# Patient Record
Sex: Female | Born: 1935
Health system: Southern US, Community
[De-identification: ages and names within clinical notes are randomized; demographics above are authoritative.]

## PROBLEM LIST (undated history)

## (undated) DIAGNOSIS — E1129 Type 2 diabetes mellitus with other diabetic kidney complication: Secondary | ICD-10-CM

## (undated) DIAGNOSIS — R7989 Other specified abnormal findings of blood chemistry: Secondary | ICD-10-CM

## (undated) DIAGNOSIS — E785 Hyperlipidemia, unspecified: Secondary | ICD-10-CM

## (undated) DIAGNOSIS — N959 Unspecified menopausal and perimenopausal disorder: Secondary | ICD-10-CM

## (undated) DIAGNOSIS — Z950 Presence of cardiac pacemaker: Secondary | ICD-10-CM

## (undated) DIAGNOSIS — D649 Anemia, unspecified: Secondary | ICD-10-CM

## (undated) DIAGNOSIS — I10 Essential (primary) hypertension: Secondary | ICD-10-CM

## (undated) DIAGNOSIS — K5790 Diverticulosis of intestine, part unspecified, without perforation or abscess without bleeding: Secondary | ICD-10-CM

## (undated) DIAGNOSIS — M1990 Unspecified osteoarthritis, unspecified site: Secondary | ICD-10-CM

## (undated) DIAGNOSIS — E559 Vitamin D deficiency, unspecified: Secondary | ICD-10-CM

## (undated) DIAGNOSIS — R011 Cardiac murmur, unspecified: Secondary | ICD-10-CM

## (undated) DIAGNOSIS — G909 Disorder of the autonomic nervous system, unspecified: Secondary | ICD-10-CM

## (undated) DIAGNOSIS — N6019 Diffuse cystic mastopathy of unspecified breast: Secondary | ICD-10-CM

## (undated) DIAGNOSIS — N183 Chronic kidney disease, stage 3 unspecified: Secondary | ICD-10-CM

## (undated) DIAGNOSIS — I495 Sick sinus syndrome: Secondary | ICD-10-CM

## (undated) DIAGNOSIS — M810 Age-related osteoporosis without current pathological fracture: Secondary | ICD-10-CM

## (undated) DIAGNOSIS — R945 Abnormal results of liver function studies: Secondary | ICD-10-CM

## (undated) HISTORY — DX: Chronic kidney disease, stage 3 (moderate): N18.3

## (undated) HISTORY — DX: Unspecified osteoarthritis, unspecified site: M19.90

## (undated) HISTORY — DX: Other specified abnormal findings of blood chemistry: R79.89

## (undated) HISTORY — DX: Anemia, unspecified: D64.9

## (undated) HISTORY — DX: Chronic kidney disease, stage 3 unspecified: N18.30

## (undated) HISTORY — DX: Disorder of the autonomic nervous system, unspecified: G90.9

## (undated) HISTORY — PX: INSERT / REPLACE / REMOVE PACEMAKER: SUR710

## (undated) HISTORY — DX: Abnormal results of liver function studies: R94.5

## (undated) HISTORY — DX: Type 2 diabetes mellitus with other diabetic kidney complication: E11.29

## (undated) HISTORY — DX: Cardiac murmur, unspecified: R01.1

## (undated) HISTORY — DX: Sick sinus syndrome: I49.5

## (undated) HISTORY — DX: Vitamin D deficiency, unspecified: E55.9

## (undated) HISTORY — DX: Unspecified menopausal and perimenopausal disorder: N95.9

## (undated) HISTORY — DX: Hyperlipidemia, unspecified: E78.5

## (undated) HISTORY — DX: Diverticulosis of intestine, part unspecified, without perforation or abscess without bleeding: K57.90

## (undated) HISTORY — DX: Essential (primary) hypertension: I10

## (undated) HISTORY — DX: Diffuse cystic mastopathy of unspecified breast: N60.19

## (undated) HISTORY — DX: Age-related osteoporosis without current pathological fracture: M81.0

---

## 2004-01-19 ENCOUNTER — Ambulatory Visit: Payer: Self-pay

## 2004-02-28 HISTORY — PX: PACEMAKER INSERTION: SHX728

## 2004-08-12 ENCOUNTER — Other Ambulatory Visit: Payer: Self-pay

## 2004-08-12 ENCOUNTER — Inpatient Hospital Stay: Payer: Self-pay | Admitting: Internal Medicine

## 2004-08-13 ENCOUNTER — Other Ambulatory Visit: Payer: Self-pay

## 2004-08-16 ENCOUNTER — Other Ambulatory Visit: Payer: Self-pay

## 2005-05-08 ENCOUNTER — Ambulatory Visit: Payer: Self-pay | Admitting: General Surgery

## 2005-06-02 ENCOUNTER — Ambulatory Visit: Payer: Self-pay | Admitting: Family Medicine

## 2006-08-02 IMAGING — CR DG CHEST 1V PORT
1 series · 1 of 1 positions shown · non-contrast
Comparison: none

REASON FOR EXAM: Central line placement
COMMENTS:

PROCEDURE:     DXR - DXR PORTABLE CHEST SINGLE VIEW  - August 12, 2004 [DATE]
RESULT:
CLINICAL HISTORY: 70-year-old female status post central line placement.

[view not recorded]
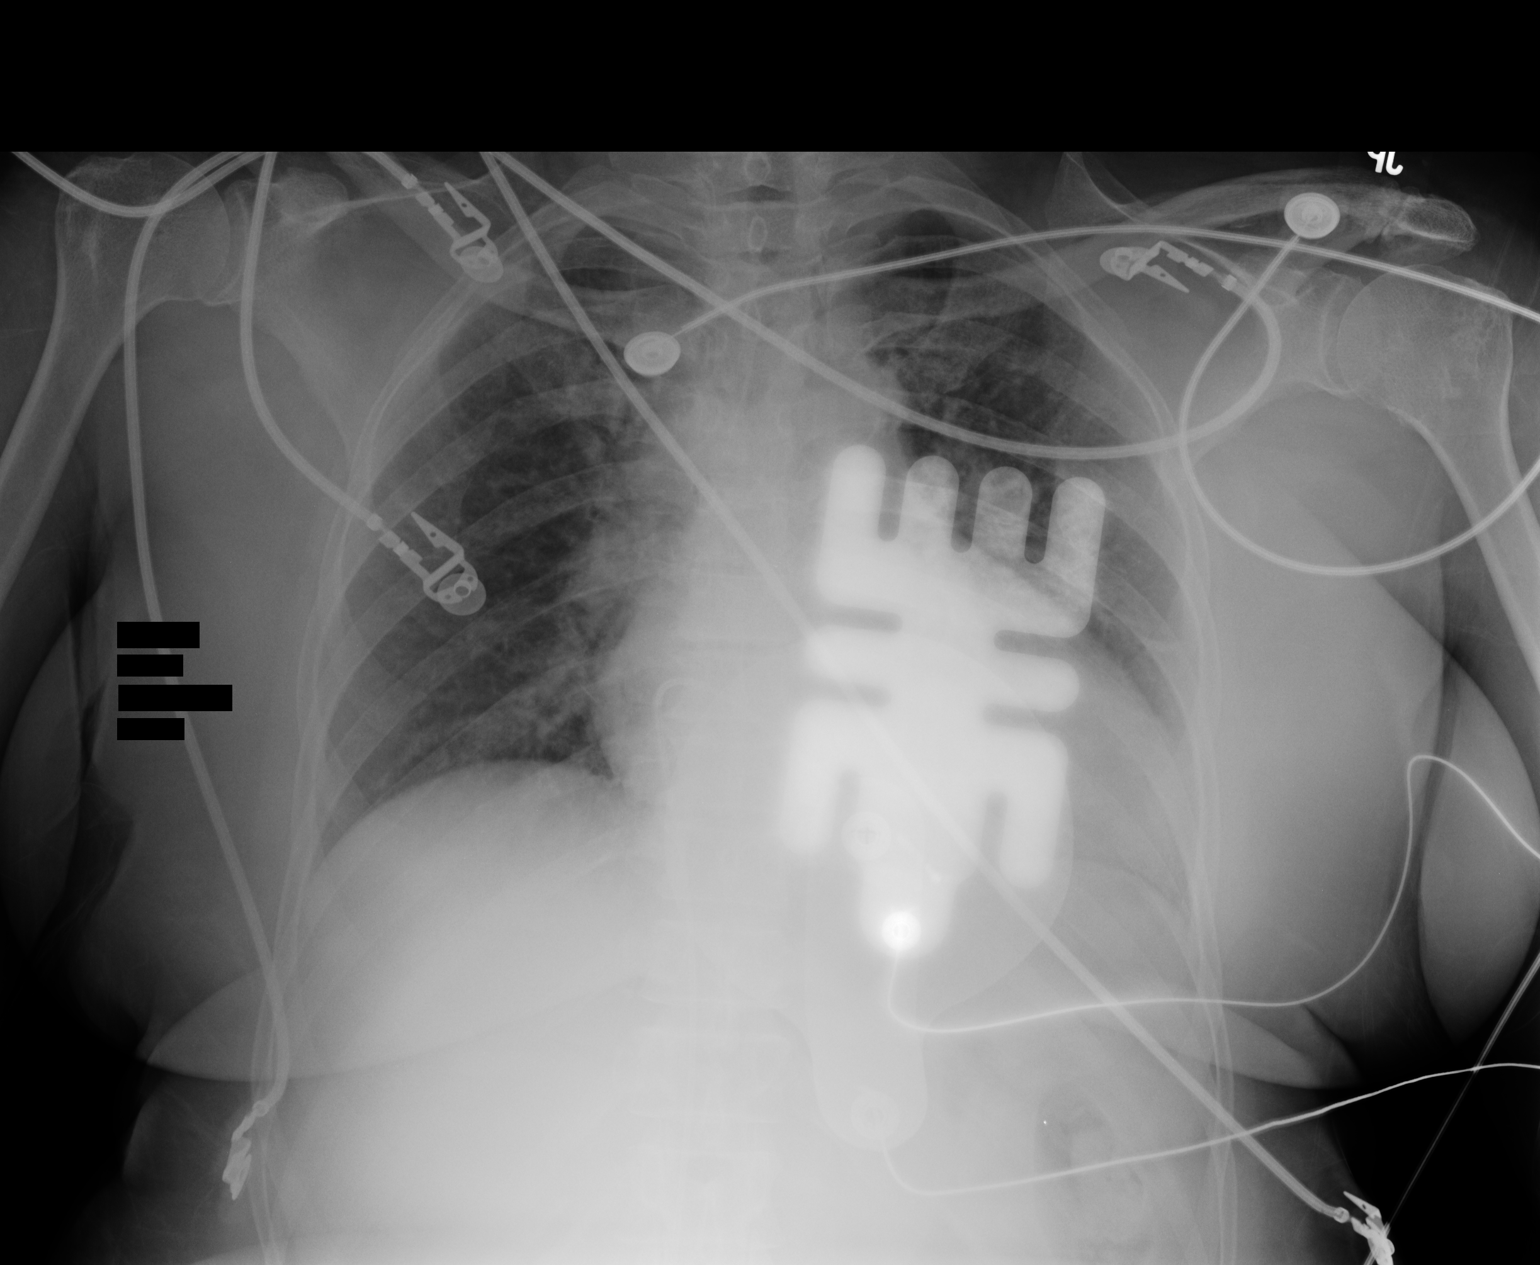

[1 of 1 positions shown; findings below may reference images not displayed]

FINDINGS: Single AP view of the chest is provided.  The lungs are
hypoinflated.  Cardiac silhouette may be enlarged.  There is vascular
congestion without air-space consolidation or definite evidence for edema.
No pleural effusion or pneumothorax is seen.  There is a transcutaneous
pacing pad overlying the LEFT mediastinum, which obscures visualization in
this area.  There is a line in place from an inferior approach which courses
over the expected location of the RIGHT atrium before turning to the LEFT
and terminating over the region of the RIGHT ventricle.  This line has a
radiopaque tip and has the appearance of an transvenous pacing wire.  No
other lines are seen.
IMPRESSION: Cardiomegaly and hypoventilation.

Probable temporary transvenous pacer wire from an inferior approach
terminating over the region of the RIGHT ventricle.

## 2006-08-02 IMAGING — CT CT HEAD WITHOUT CONTRAST
1 series · 16 of 29 positions shown, 20 images · non-contrast
Comparison: none

REASON FOR EXAM: syncope
COMMENTS:

[Series 3: head injury 5.0 h40s · axial · 0.41mm/px · z∈[-180,-50]mm · 16 of 29 slices shown, 20 images]
[im 2/29  brain]
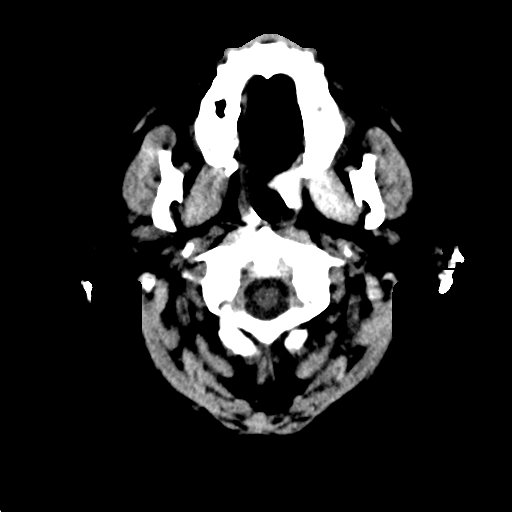
[im 2/29  bone]
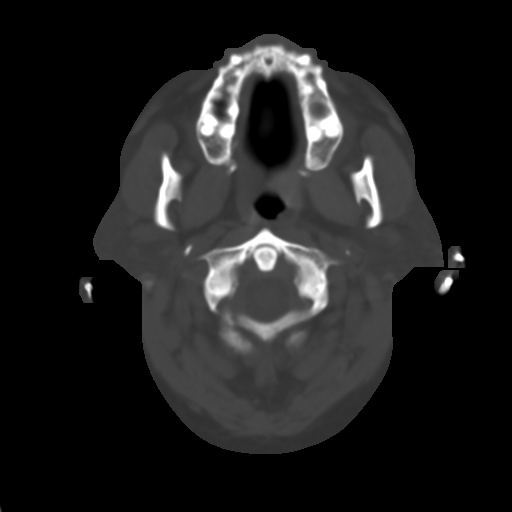
[im 4/29  brain]
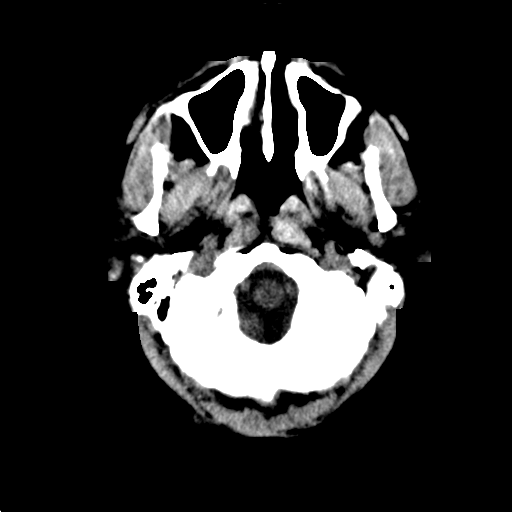
[im 6/29  brain]
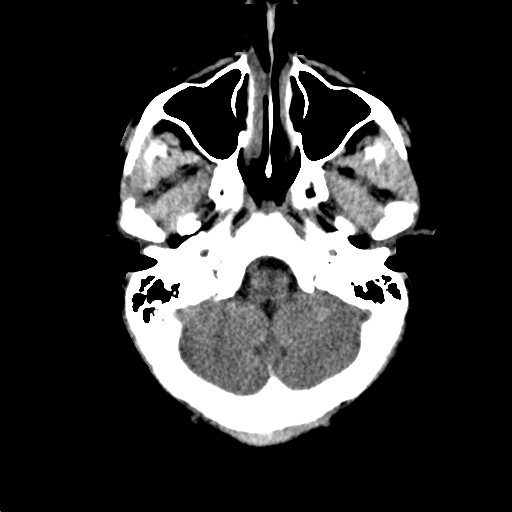
[im 7/29  brain]
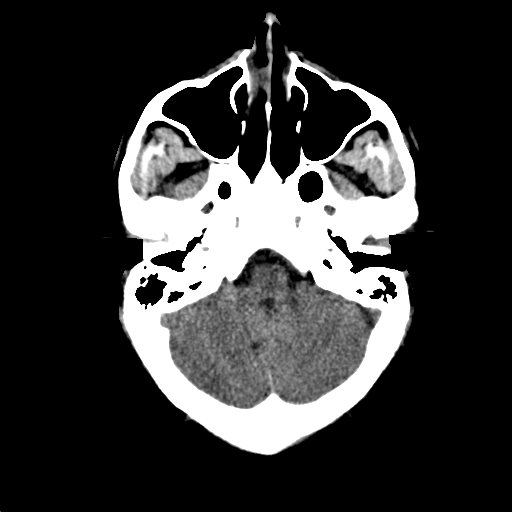
[im 9/29  brain]
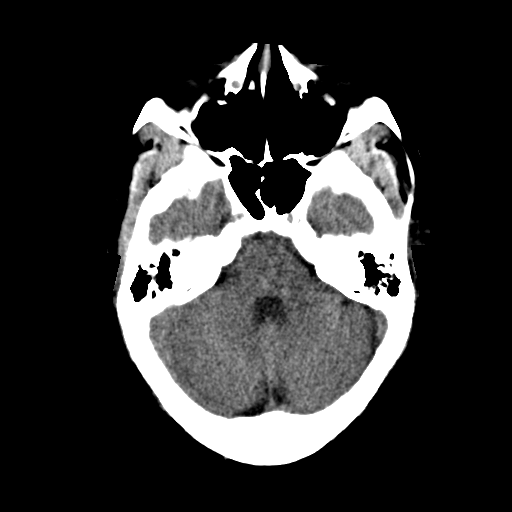
[im 9/29  bone]
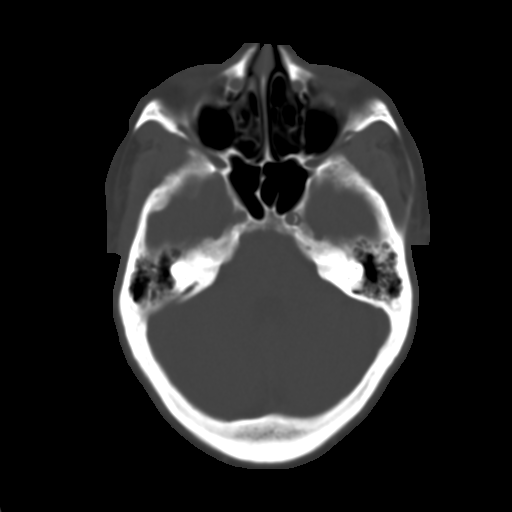
[im 11/29  brain]
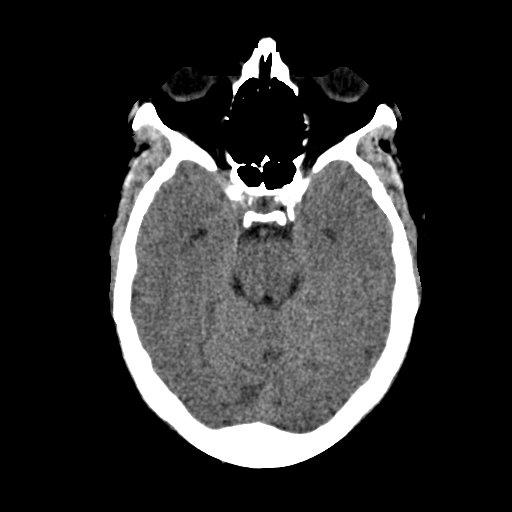
[im 12/29  brain]
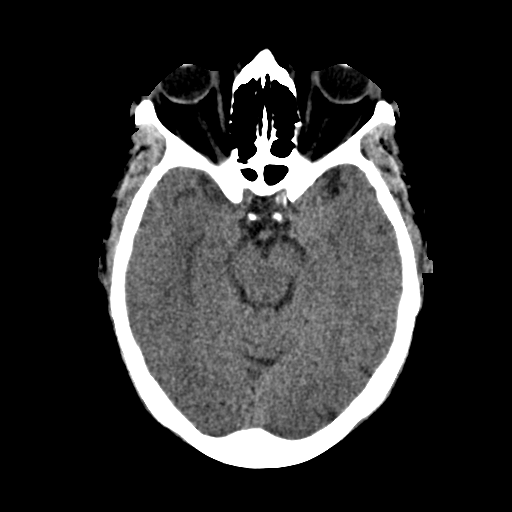
[im 14/29  brain]
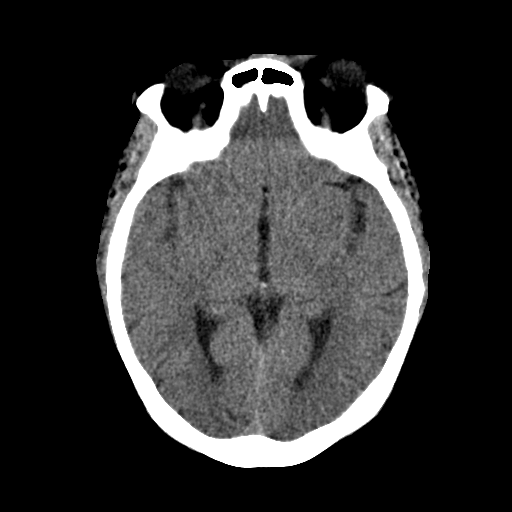
[im 16/29  brain]
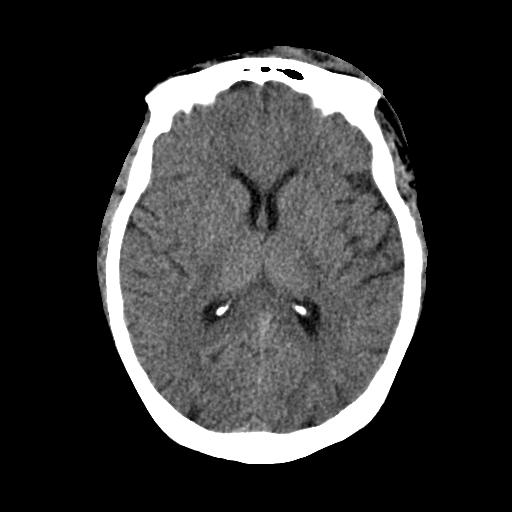
[im 16/29  bone]
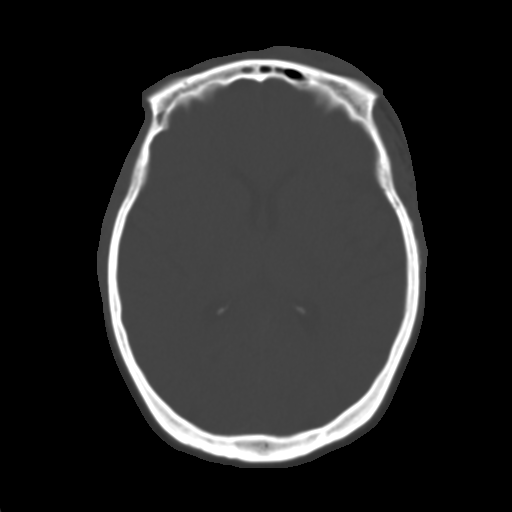
[im 18/29  brain]
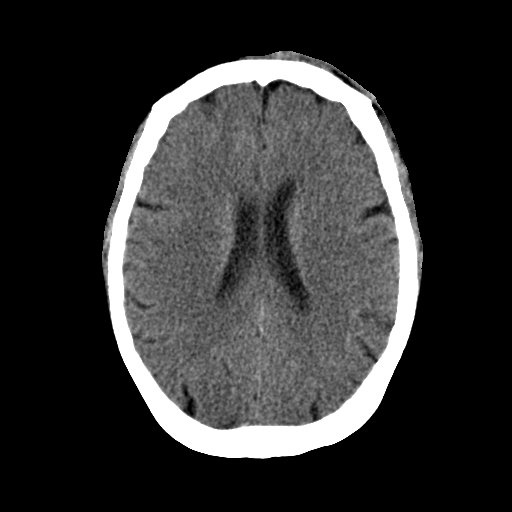
[im 19/29  brain]
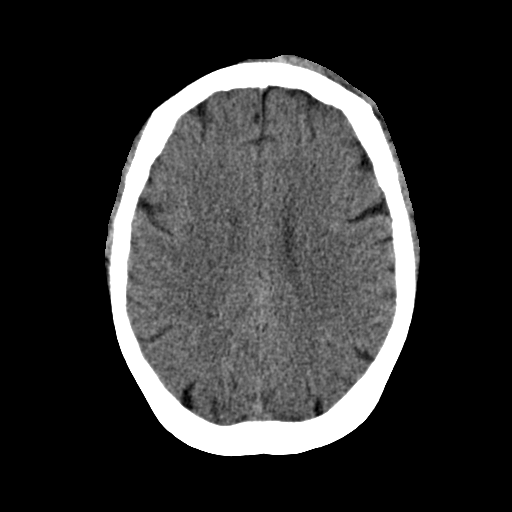
[im 21/29  brain]
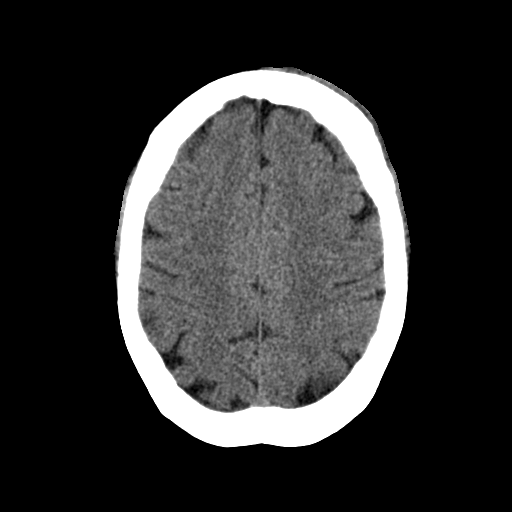
[im 23/29  brain]
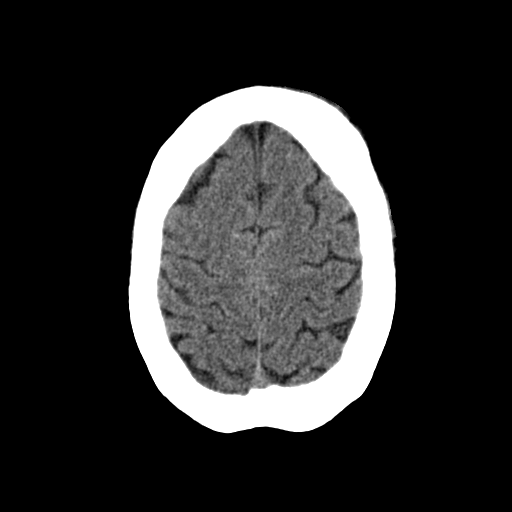
[im 23/29  bone]
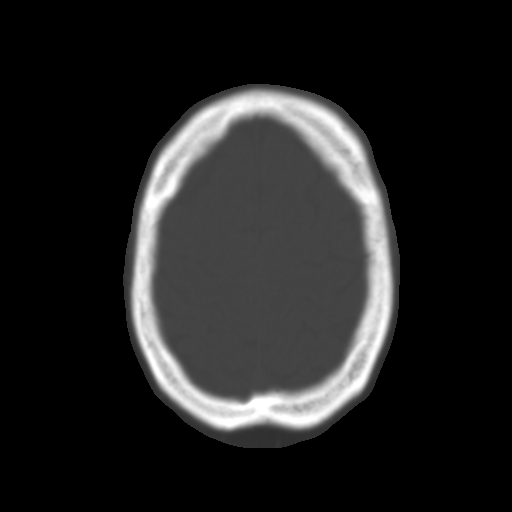
[im 24/29  brain]
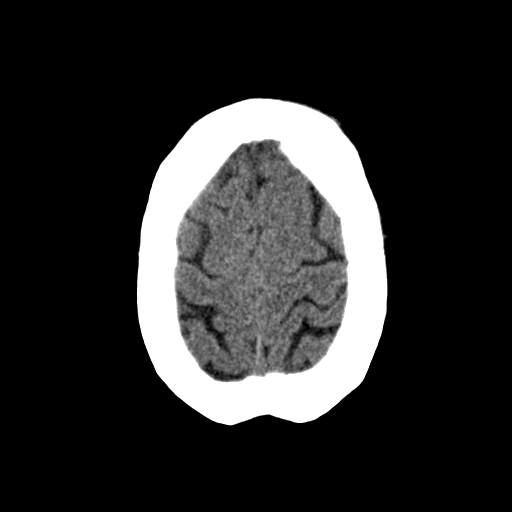
[im 26/29  brain]
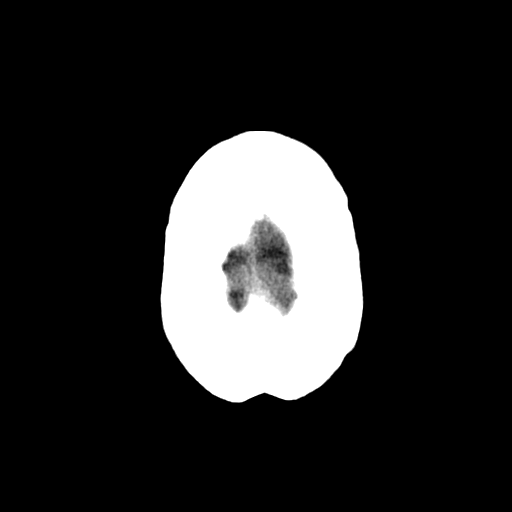
[im 28/29  brain]
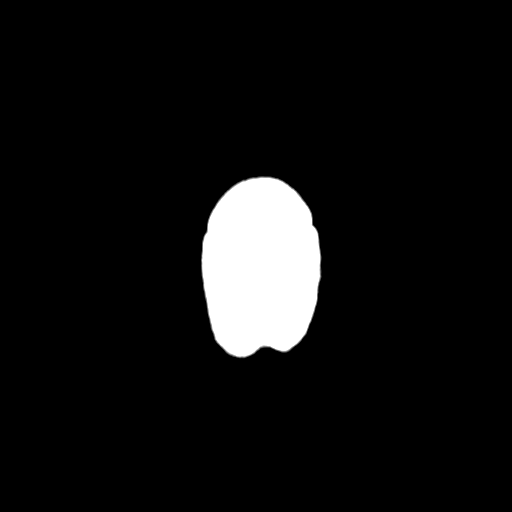

[16 of 29 positions shown; findings below may reference images not displayed]

PROCEDURE:     CT  - CT HEAD WITHOUT CONTRAST  - August 12, 2004  [DATE]

RESULT:         There is no evidence of intra-axial nor extra-axial fluid
collections nor evidence of acute hemorrhage.  No secondary signs are
appreciated to suggest mass effect, subacute or chronic infarction.  A
periorbital hematoma is appreciated involving the LEFT eye.  The visualized
bony skeleton demonstrates no evidence of fracture nor dislocation.
IMPRESSION: 1.     Periorbital hematoma on the LEFT.
2.     No evidence of intracranial abnormalities.
3.     Dr. Elsner of the Emergency Department was informed of these findings
at the time of the interpretation.

## 2007-08-21 ENCOUNTER — Ambulatory Visit: Payer: Self-pay | Admitting: Family Medicine

## 2007-09-30 ENCOUNTER — Ambulatory Visit: Payer: Self-pay | Admitting: Specialist

## 2009-04-12 DIAGNOSIS — E559 Vitamin D deficiency, unspecified: Secondary | ICD-10-CM | POA: Insufficient documentation

## 2009-07-15 ENCOUNTER — Ambulatory Visit: Payer: Self-pay | Admitting: Internal Medicine

## 2009-12-30 ENCOUNTER — Ambulatory Visit: Payer: Self-pay | Admitting: Internal Medicine

## 2010-01-05 ENCOUNTER — Ambulatory Visit: Payer: Self-pay | Admitting: Internal Medicine

## 2010-05-26 ENCOUNTER — Ambulatory Visit: Payer: Self-pay | Admitting: Internal Medicine

## 2011-08-28 ENCOUNTER — Ambulatory Visit: Payer: Self-pay | Admitting: Family Medicine

## 2011-08-28 LAB — HM COLONOSCOPY: HM Colonoscopy: NORMAL

## 2012-04-10 ENCOUNTER — Ambulatory Visit: Payer: Self-pay | Admitting: Family Medicine

## 2013-08-13 DIAGNOSIS — N183 Chronic kidney disease, stage 3 unspecified: Secondary | ICD-10-CM | POA: Insufficient documentation

## 2013-08-13 DIAGNOSIS — I10 Essential (primary) hypertension: Secondary | ICD-10-CM | POA: Insufficient documentation

## 2013-08-13 DIAGNOSIS — I495 Sick sinus syndrome: Secondary | ICD-10-CM | POA: Insufficient documentation

## 2013-08-13 DIAGNOSIS — E7800 Pure hypercholesterolemia, unspecified: Secondary | ICD-10-CM | POA: Insufficient documentation

## 2013-08-13 DIAGNOSIS — E1129 Type 2 diabetes mellitus with other diabetic kidney complication: Secondary | ICD-10-CM | POA: Insufficient documentation

## 2014-04-02 DIAGNOSIS — I10 Essential (primary) hypertension: Secondary | ICD-10-CM | POA: Diagnosis not present

## 2014-04-02 DIAGNOSIS — R635 Abnormal weight gain: Secondary | ICD-10-CM | POA: Diagnosis not present

## 2014-04-02 DIAGNOSIS — L609 Nail disorder, unspecified: Secondary | ICD-10-CM | POA: Diagnosis not present

## 2014-04-02 DIAGNOSIS — N183 Chronic kidney disease, stage 3 (moderate): Secondary | ICD-10-CM | POA: Diagnosis not present

## 2014-04-02 DIAGNOSIS — E785 Hyperlipidemia, unspecified: Secondary | ICD-10-CM | POA: Diagnosis not present

## 2014-04-02 DIAGNOSIS — H9313 Tinnitus, bilateral: Secondary | ICD-10-CM | POA: Diagnosis not present

## 2014-04-02 DIAGNOSIS — E1129 Type 2 diabetes mellitus with other diabetic kidney complication: Secondary | ICD-10-CM | POA: Diagnosis not present

## 2014-04-02 DIAGNOSIS — R945 Abnormal results of liver function studies: Secondary | ICD-10-CM | POA: Diagnosis not present

## 2014-04-02 LAB — LIPID PANEL
Cholesterol: 210 mg/dL — AB (ref 0–200)
HDL: 106 mg/dL — AB (ref 35–70)
LDL Cholesterol: 93 mg/dL

## 2014-04-14 ENCOUNTER — Ambulatory Visit: Payer: Self-pay | Admitting: Podiatry

## 2014-04-16 ENCOUNTER — Ambulatory Visit (INDEPENDENT_AMBULATORY_CARE_PROVIDER_SITE_OTHER): Payer: Commercial Managed Care - HMO | Admitting: Podiatry

## 2014-04-16 VITALS — BP 144/73 | HR 69 | Resp 16 | Ht 62.0 in | Wt 136.0 lb

## 2014-04-16 DIAGNOSIS — M79676 Pain in unspecified toe(s): Secondary | ICD-10-CM | POA: Diagnosis not present

## 2014-04-16 DIAGNOSIS — B351 Tinea unguium: Secondary | ICD-10-CM

## 2014-04-16 NOTE — Patient Instructions (Signed)
Diabetes and Foot Care Diabetes may cause you to have problems because of poor blood supply (circulation) to your feet and legs. This may cause the skin on your feet to become thinner, break easier, and heal more slowly. Your skin may become dry, and the skin may peel and crack. You may also have nerve damage in your legs and feet causing decreased feeling in them. You may not notice minor injuries to your feet that could lead to infections or more serious problems. Taking care of your feet is one of the most important things you can do for yourself.  HOME CARE INSTRUCTIONS  Wear shoes at all times, even in the house. Do not go barefoot. Bare feet are easily injured.  Check your feet daily for blisters, cuts, and redness. If you cannot see the bottom of your feet, use a mirror or ask someone for help.  Wash your feet with warm water (do not use hot water) and mild soap. Then pat your feet and the areas between your toes until they are completely dry. Do not soak your feet as this can dry your skin.  Apply a moisturizing lotion or petroleum jelly (that does not contain alcohol and is unscented) to the skin on your feet and to dry, brittle toenails. Do not apply lotion between your toes.  Trim your toenails straight across. Do not dig under them or around the cuticle. File the edges of your nails with an emery board or nail file.  Do not cut corns or calluses or try to remove them with medicine.  Wear clean socks or stockings every day. Make sure they are not too tight. Do not wear knee-high stockings since they may decrease blood flow to your legs.  Wear shoes that fit properly and have enough cushioning. To break in new shoes, wear them for just a few hours a day. This prevents you from injuring your feet. Always look in your shoes before you put them on to be sure there are no objects inside.  Do not cross your legs. This may decrease the blood flow to your feet.  If you find a minor scrape,  cut, or break in the skin on your feet, keep it and the skin around it clean and dry. These areas may be cleansed with mild soap and water. Do not cleanse the area with peroxide, alcohol, or iodine.  When you remove an adhesive bandage, be sure not to damage the skin around it.  If you have a wound, look at it several times a day to make sure it is healing.  Do not use heating pads or hot water bottles. They may burn your skin. If you have lost feeling in your feet or legs, you may not know it is happening until it is too late.  Make sure your health care provider performs a complete foot exam at least annually or more often if you have foot problems. Report any cuts, sores, or bruises to your health care provider immediately. SEEK MEDICAL CARE IF:   You have an injury that is not healing.  You have cuts or breaks in the skin.  You have an ingrown nail.  You notice redness on your legs or feet.  You feel burning or tingling in your legs or feet.  You have pain or cramps in your legs and feet.  Your legs or feet are numb.  Your feet always feel cold. SEEK IMMEDIATE MEDICAL CARE IF:   There is increasing redness,   swelling, or pain in or around a wound.  There is a red line that goes up your leg.  Pus is coming from a wound.  You develop a fever or as directed by your health care provider.  You notice a bad smell coming from an ulcer or wound. Document Released: 02/11/2000 Document Revised: 10/16/2012 Document Reviewed: 07/23/2012 ExitCare Patient Information 2015 [employer]. This information is not intended to replace advice given to you by your health care provider. Make sure you discuss any questions you have with your health care provider.  

## 2014-04-17 NOTE — Progress Notes (Signed)
Subjective:     Patient ID: Emma Campbell, female   DOB: Jan 05, 1933, 81 y.o.   MRN: [id number]  HPI 81 year old female presents the office today with complaints of painful, elongated toenails and nail fungus. She states that she is unable to take care of the nails herself as they are thickened. She denies any recent redness or drainage from the nail sites. She is diabetic and she states her last blood sugar was 112 yesterday. She denies any history of ulcerations, talus/numbness or any intermittent claudication symptoms. No other complaints at this time.  Review of Systems  All other systems reviewed and are negative.      Objective:   Physical Exam AAO 3, NAD DP/PT pulses palpable, CRT less than 3 seconds Protective sensation intact with Simms Weinstein monofilament, vibratory sensation intact, Achilles tendon reflex intact. Nails are hypertrophic, dystrophic, elongated, brittle, discolored 10. There subjective tenderness along nails 1 through 5 bilaterally. There is no swelling erythema or drainage. There is no evidence of tinea pedis and there is no interdigital maceration. No open lesions or pre-ulcer lesions identified. No other areas of tenderness to bilateral lower extremities No pain with calf compression, swelling, warmth, erythema.    Assessment:     81 year old female with symptomatic onychomycosis    Plan:     -Treatment options discussed with the patient include alternatives, risks, complications. -Nail sharply debrided 10 without complication/bleeding. -Discussed importance daily foot inspection. -Follow-up in 3 months or sooner if any problems are to arise. In the meantime, encouraged call the office with any questions, concerns, change in symptoms.

## 2014-07-16 ENCOUNTER — Ambulatory Visit: Payer: Commercial Managed Care - HMO

## 2014-07-16 NOTE — Patient Outreach (Signed)
[place] [facility]) Care Management  07/16/2014  Emma Campbell 11/29/1932 [id number]   Received phone from patient requesting a call from [place] Management, patient was previously with [employer] at home.  Assigned [name], RN  [name]. [place], [employer] Management [place] Assistant Phone: [phone] Fax: [phone]

## 2014-07-30 ENCOUNTER — Other Ambulatory Visit: Payer: Self-pay

## 2014-07-30 DIAGNOSIS — E119 Type 2 diabetes mellitus without complications: Secondary | ICD-10-CM

## 2014-07-30 NOTE — Patient Outreach (Signed)
[facility] [place] [facility]) Care Management  07/30/2014  Emma Campbell [birth year] [id number]    RN CM spoke with patient to discuss the services of [facility].  Patient had called into our main office to inquire about our services.  Patient states she needed help affording hearing aids.  States she recently had a hearing exam and was told she needed to hearing aids for both ears.  Patient stated she did not have the money to purchase them and wanted to know if [facility] could help with this.  RN CM asked patient was she willing to speak with a Education officer, museum and have a financial assessment done to see if she qualified for assistance.  Patient agreed to speak with a Education officer, museum.  RN CM asked patient how was she managing her diabetes.  Patient states she stopped taking one of her diabetes medication because she could not afford it.  The medication was called Januvia.  States Dr. [name] is aware and asked that she continue to take her Metformin.  Patient states she does take the Metformin daily.  Reports her cgbs have been in a range of 90- 160 and checking blood sugars daily.  Patient reports her eating habits are not the best.  Patient eats out at fast food places and sometimes skips breakfast meal.  States sometimes she will only have coffee for breakfast and sometimes she will eat breakfast at [facility]  Patient reports taking her blood pressure 2 times a week.  Reports yesterday reading was 129/70.  RN CM explained to patient the benefits of working with a [place] and learn how to self-manage her diabetes and high blood pressure.  Patient agrees to the services of [facility] and scheduling an appointment.  RN CM will make a referral for [employer] and Social Work.    [name], RN, [place]mael Holter, [place] Telephonic Care Coordinator [phone]

## 2014-07-30 NOTE — Patient Outreach (Signed)
[facility]) Care Management  07/30/2014  Emma Campbell May 01, 1932 [id number]   Notification from [name], RN to assign SW and [place], assigned [place], LCSW and Jon Billings, RN  [employer]. [place], [place] Management [place] Assistant Phone: [phone] Fax: [phone]

## 2014-07-31 ENCOUNTER — Encounter: Payer: Self-pay | Admitting: Family Medicine

## 2014-07-31 ENCOUNTER — Ambulatory Visit (INDEPENDENT_AMBULATORY_CARE_PROVIDER_SITE_OTHER): Payer: Commercial Managed Care - HMO | Admitting: Family Medicine

## 2014-07-31 VITALS — BP 128/76 | HR 87 | Temp 98.6°F | Resp 18 | Ht 61.5 in | Wt 135.2 lb

## 2014-07-31 DIAGNOSIS — I495 Sick sinus syndrome: Secondary | ICD-10-CM | POA: Diagnosis not present

## 2014-07-31 DIAGNOSIS — E1129 Type 2 diabetes mellitus with other diabetic kidney complication: Secondary | ICD-10-CM

## 2014-07-31 DIAGNOSIS — E1121 Type 2 diabetes mellitus with diabetic nephropathy: Secondary | ICD-10-CM

## 2014-07-31 DIAGNOSIS — R946 Abnormal results of thyroid function studies: Secondary | ICD-10-CM | POA: Diagnosis not present

## 2014-07-31 DIAGNOSIS — Z950 Presence of cardiac pacemaker: Secondary | ICD-10-CM

## 2014-07-31 DIAGNOSIS — Z23 Encounter for immunization: Secondary | ICD-10-CM

## 2014-07-31 DIAGNOSIS — N189 Chronic kidney disease, unspecified: Secondary | ICD-10-CM | POA: Diagnosis not present

## 2014-07-31 DIAGNOSIS — E785 Hyperlipidemia, unspecified: Secondary | ICD-10-CM

## 2014-07-31 DIAGNOSIS — Z8679 Personal history of other diseases of the circulatory system: Secondary | ICD-10-CM

## 2014-07-31 DIAGNOSIS — R809 Proteinuria, unspecified: Secondary | ICD-10-CM

## 2014-07-31 DIAGNOSIS — I10 Essential (primary) hypertension: Secondary | ICD-10-CM | POA: Diagnosis not present

## 2014-07-31 DIAGNOSIS — I442 Atrioventricular block, complete: Secondary | ICD-10-CM | POA: Insufficient documentation

## 2014-07-31 DIAGNOSIS — R7989 Other specified abnormal findings of blood chemistry: Secondary | ICD-10-CM

## 2014-07-31 DIAGNOSIS — E1122 Type 2 diabetes mellitus with diabetic chronic kidney disease: Secondary | ICD-10-CM | POA: Diagnosis not present

## 2014-07-31 LAB — POCT UA - MICROALBUMIN: Microalbumin Ur, POC: 50 mg/L

## 2014-07-31 LAB — POCT GLYCOSYLATED HEMOGLOBIN (HGB A1C): Hemoglobin A1C: 6.3

## 2014-07-31 MED ORDER — ROSUVASTATIN CALCIUM 5 MG PO TABS
5.0000 mg | ORAL_TABLET | Freq: Every day | ORAL | Status: DC
Start: 2014-07-31 — End: 2015-08-03

## 2014-07-31 MED ORDER — AMLODIPINE BESYLATE-VALSARTAN 5-320 MG PO TABS
1.0000 | ORAL_TABLET | Freq: Every day | ORAL | Status: DC
Start: 2014-07-31 — End: 2014-11-30

## 2014-07-31 NOTE — Progress Notes (Signed)
Name: Emma Campbell   MRN: [id number]    DOB: 1932-11-07   Date:07/31/2014       Progress Note  Subjective  Chief Complaint  Chief Complaint  Patient presents with  . Medication Refill    4 month F/U    HPI  DM: she has been taking Metformin daily but takes Onglyza only when glucose is not at goal, fsbs about 3 times weekly and has been between 113-140, denies polydipsia or polyuria, no side effects of medication. Eye exam is up to date, she checks her feet daily.   HTN: bp at home is usually 120's-130's/80's, taking norvasc and losartan/hctz but sometimes has to skip the morning dose of losartan because bp is low, no constipation , no sweeling  Sick Sinus Syndrome: she had a pace maker placed in 2006 and is taking Amiodarone since 2006, last TSH was elevated, no chest pain or palpitation, no syncope. Doing well    Patient Active Problem List   Diagnosis Date Noted  . Microalbuminuria 07/31/2014  . Sick sinus syndrome 08/13/2013  . Hypercholesteremia 08/13/2013  . Hypertension 08/13/2013  . Diabetes mellitus with renal manifestations, controlled 08/13/2013    Past Surgical History  Procedure Laterality Date  . Pacemaker insertion  2006    Family History  Problem Relation Age of Onset  . Hypertension Mother   . Stroke Mother   . Diabetes Daughter     History   Social History  . Marital Status: Widowed    Spouse Name: N/A  . Number of Children: 1  . Years of Education: 13   Occupational History  . Retired [employer] at [place] Topics  . Smoking status: Never Smoker   . Smokeless tobacco: Not on file  . Alcohol Use: No  . Drug Use: No  . Sexual Activity: No   Other Topics Concern  . Not on file   Social History Narrative  . No narrative on file     Current outpatient prescriptions:  .  amiodarone (PACERONE) 200 MG tablet, Take 200 mg by mouth daily., Disp: , Rfl:  .  aspirin 81 MG tablet, Take 81 mg by mouth daily., Disp:  , Rfl:  .  cholecalciferol (VITAMIN D) 1000 UNITS tablet, Take 200 Units by mouth daily., Disp: , Rfl:  .  folic acid (FOLVITE) 1 MG tablet, Take 1 mg by mouth daily., Disp: , Rfl:  .  metFORMIN (GLUCOPHAGE) 500 MG tablet, Take 500 mg by mouth 1 day or 1 dose., Disp: , Rfl:  .  rosuvastatin (CRESTOR) 5 MG tablet, Take 1 tablet (5 mg total) by mouth daily., Disp: 90 tablet, Rfl: 4 .  amLODipine-valsartan (EXFORGE) 5-320 MG per tablet, Take 1 tablet by mouth daily., Disp: 90 tablet, Rfl: 1  No Known Allergies   ROS  Constitutional: Negative for fever or weight change.  Respiratory: Negative for cough and shortness of breath.   Cardiovascular: Negative for chest pain or palpitations.  ENT: seen by ENT and was given reassurance Gastrointestinal: Negative for abdominal pain, no bowel changes.  Musculoskeletal: Negative for gait problem or joint swelling.  Skin: Negative for rash.  Neurological: Negative for dizziness or headache.  No other specific complaints in a complete review of systems (except as listed in HPI above).  Objective  Filed Vitals:   07/31/14 1119  BP: 128/76  Pulse: 87  Temp: 98.6 F (37 C)  TempSrc: Oral  Resp: 18  Height: 5' 1.5" (  1.562 m)  Weight: 135 lb 3.2 oz (61.326 kg)  SpO2: 97%    Physical Exam   Constitutional: Patient appears well-developed and well-nourished. No distress.  HENT: Head: Normocephalic and atraumatic. Ears: B TMs ok, no erythema or effusion; Nose: Nose normal. Mouth/Throat: Oropharynx is clear and moist. No oropharyngeal exudate.  Eyes: Conjunctivae and EOM are normal. Pupils are equal, round, and reactive to light. No scleral icterus.  Neck: Normal range of motion. Neck supple. No JVD present. No thyromegaly present.  Cardiovascular: Normal rate, regular rhythm and normal heart sounds.  No murmur heard. No BLE edema. Pulmonary/Chest: Effort normal and breath sounds normal. No respiratory distress. Abdominal: Soft. Bowel sounds are  normal, no distension. There is no tenderness. no masses Musculoskeletal: Normal range of motion, no joint effusions. No gross deformities Neurological: he is alert and oriented to person, place, and time. No cranial nerve deficit. Coordination, balance, strength, speech and gait are normal.  Skin: Skin is warm and dry. No rash noted. No erythema.  Psychiatric: Patient has a normal mood and affect. behavior is normal. Judgment and thought content normal.  Depression screen PHQ 2/9 07/31/2014  Decreased Interest 0  Down, Depressed, Hopeless 0  PHQ - 2 Score 0   Diabetic Foot Exam - Simple   Simple Foot Form  Visual Inspection  No deformities, no ulcerations, no other skin breakdown bilaterally:  Yes  Sensation Testing  Intact to touch and monofilament testing bilaterally:  Yes  Pulse Check  Posterior Tibialis and Dorsalis pulse intact bilaterally:  Yes  Comments      Recent Results (from the past 2160 hour(s))  POCT UA - Microalbumin     Status: None   Collection Time: 07/31/14 11:37 AM  Result Value Ref Range   Microalbumin Ur, POC 50 mg/L   Creatinine, POC  mg/dL   Albumin/Creatinine Ratio, Urine, POC    POCT glycosylated hemoglobin (Hb A1C)     Status: None   Collection Time: 07/31/14 11:39 AM  Result Value Ref Range   Hemoglobin A1C 6.3     .Assessment & Plan  1. 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 Microalbuminuria Secondary to DM, we will increase dose of ARB and monitor  3. Essential hypertension bp is dropping sometimes at home and she is skipping the losartan/hctz because of it, we will stop medications and change to the one below - amLODipine-valsartan (EXFORGE) 5-320 MG per tablet; Take 1 tablet by mouth daily.  Dispense: 90  tablet; Refill: 1  4. Sick sinus syndrome Doing well since on pacemaker and amiodarone, but TSH is going up, we will recheck levels  5. Dyslipidemia We will decrease Crestor to 5mg , currently she is taking half of a 10 mg tablet - rosuvastatin (CRESTOR) 5 MG tablet; Take 1 tablet (5 mg total) by mouth daily.  Dispense: 90 tablet; Refill: 4  7. Low TSH level Recheck today and if still high, notify Dr. Clayborn Bigness and start supplementation - TSH  9. Need for pneumococcal vaccination - Pneumococcal conjugate vaccine 13-valent IM

## 2014-07-31 NOTE — Patient Instructions (Signed)
Hypothyroidism The thyroid is a large gland located in the lower front of your neck. The thyroid gland helps control metabolism. Metabolism is how your body handles food. It controls metabolism with the hormone thyroxine. When this gland is underactive (hypothyroid), it produces too little hormone.  CAUSES These include:   Absence or destruction of thyroid tissue.  Goiter due to iodine deficiency.  Goiter due to medications.  Congenital defects (since birth).  Problems with the pituitary. This causes a lack of TSH (thyroid stimulating hormone). This hormone tells the thyroid to turn out more hormone. SYMPTOMS  Lethargy (feeling as though you have no energy)  Cold intolerance  Weight gain (in spite of normal food intake)  Dry skin  Coarse hair  Menstrual irregularity (if severe, may lead to infertility)  Slowing of thought processes Cardiac problems are also caused by insufficient amounts of thyroid hormone. Hypothyroidism in the newborn is cretinism, and is an extreme form. It is important that this form be treated adequately and immediately or it will lead rapidly to retarded physical and mental development. DIAGNOSIS  To prove hypothyroidism, your caregiver may do blood tests and ultrasound tests. Sometimes the signs are hidden. It may be necessary for your caregiver to watch this illness with blood tests either before or after diagnosis and treatment. TREATMENT  Low levels of thyroid hormone are increased by using synthetic thyroid hormone. This is a safe, effective treatment. It usually takes about four weeks to gain the full effects of the medication. After you have the full effect of the medication, it will generally take another four weeks for problems to leave. Your caregiver may start you on low doses. If you have had heart problems the dose may be gradually increased. It is generally not an emergency to get rapidly to normal. HOME CARE INSTRUCTIONS   Take your  medications as your caregiver suggests. Let your caregiver know of any medications you are taking or start taking. Your caregiver will help you with dosage schedules.  As your condition improves, your dosage needs may increase. It will be necessary to have continuing blood tests as suggested by your caregiver.  Report all suspected medication side effects to your caregiver. SEEK MEDICAL CARE IF: Seek medical care if you develop:  Sweating.  Tremulousness (tremors).  Anxiety.  Rapid weight loss.  Heat intolerance.  Emotional swings.  Diarrhea.  Weakness. SEEK IMMEDIATE MEDICAL CARE IF:  You develop chest pain, an irregular heart beat (palpitations), or a rapid heart beat. MAKE SURE YOU:   Understand these instructions.  Will watch your condition.  Will get help right away if you are not doing well or get worse. Document Released: 02/13/2005 Document Revised: 05/08/2011 Document Reviewed: 10/04/2007 ExitCare Patient Information 2015 [employer]. This information is not intended to replace advice given to you by your health care provider. Make sure you discuss any questions you have with your health care provider.  

## 2014-08-03 ENCOUNTER — Ambulatory Visit: Payer: Self-pay | Admitting: Family Medicine

## 2014-08-03 ENCOUNTER — Other Ambulatory Visit: Payer: Self-pay

## 2014-08-03 ENCOUNTER — Other Ambulatory Visit: Payer: Self-pay | Admitting: *Deleted

## 2014-08-03 NOTE — Patient Outreach (Signed)
[place] [facility]) Care Management  [place]  08/03/2014   Emma Campbell September 19, 1932 [id number]  Subjective:  Telephone call from patient.  Initial assessment completed with patient. She reports seeing her PCP on 07-31-14. Blood pressure medication changed.  Advised on importance of monitoring and recording blood pressure at least a couple of times a week to note any changes being that medication recently changed.  She verbalized understanding. Patient still drives and is very independent.  Checks blood sugar 1-2/week.  Ranges mainly in 125-140 range.  Discussed importance of diet with diabetes.  Patient mentions some kidney issues.  Discussed how kidney function can be affected by diabetes.  She verbalized understanding.  Patients main goal is to maintain her health as it is very important to her.       Current Medications:  Current Outpatient Prescriptions  Medication Sig Dispense Refill  . amiodarone (PACERONE) 200 MG tablet Take 200 mg by mouth daily.    Marland Kitchen amLODipine-valsartan (EXFORGE) 5-320 MG per tablet Take 1 tablet by mouth daily. 90 tablet 1  . aspirin 81 MG tablet Take 81 mg by mouth daily.    . cholecalciferol (VITAMIN D) 1000 UNITS tablet Take 200 Units by mouth daily.    . folic acid (FOLVITE) 1 MG tablet Take 1 mg by mouth daily.    . metFORMIN (GLUCOPHAGE) 500 MG tablet Take 500 mg by mouth 1 day or 1 dose.    . rosuvastatin (CRESTOR) 5 MG tablet Take 1 tablet (5 mg total) by mouth daily. 90 tablet 4   No current facility-administered medications for this visit.    Functional Status:  In your present state of health, do you have any difficulty performing the following activities: 08/03/2014  Hearing? Y  Vision? N  Difficulty concentrating or making decisions? N  Walking or climbing stairs? N  Dressing or bathing? N  Doing errands, shopping? N  Preparing Food and eating ? N  Using the Toilet? N  In the past six months, have you accidently leaked urine?  N  Do you have problems with loss of bowel control? N  Managing your Medications? N  Managing your Finances? N  Housekeeping or managing your Housekeeping? N    Fall/Depression Screening: PHQ 2/9 Scores 08/03/2014 07/31/2014  PHQ - 2 Score 0 0     THN CM Care Plan Problem One        Patient Outreach Telephone from 08/03/2014 in [place] Problem One  Knowledge Deficit related to Diabetes   Care Plan for Problem One  Active   THN Long Term Goal (31-90 days)  Patient will be able to maintain blood sugars less than 150 within 90 days.   THN Long Term Goal Start Date  08/03/14   Interventions for Problem One Long Term Goal  Discussed with patient complications of diabetes and importance of maintaining blood sugar control.  Also discussed kidney function in relation to diabetes.     THN CM Short Term Goal #1 (0-30 days)  Patient will be able to report maintaining low carbohydrate diet within 30 days.   THN CM Short Term Goal #1 Start Date  08/03/14   Interventions for Short Term Goal #1  Discussed avoidance of high carbohydrate foods such as potatoes, rice, and pastas.   THN CM Short Term Goal #2 (0-30 days)  Patient will report walking at least 3 times a week with the next 30 days.   THN CM Short  Term Goal #2 Start Date  08/03/14   Interventions for Short Term Goal #2  Discussed importance of maintaining regular exercise routine to help in keeping blood sugar under control.      Assessment:  Patient will benefit from [place] support for ongoing education and management of diabetes  Plan: [place] will provide ongoing education for patient on diabetes through phone calls and sending printed information to patient for further discussion. RN Health Coach will send welcome packet with consent to patient as well as printed information on diabetes. RN Health Coach will send initial barriers letter, assessment, and care plan to primary care physician. RN  Health Coach will contact patient within one month and patient agrees to next contact.

## 2014-08-03 NOTE — Patient Outreach (Signed)
[place] [facility]) Care Management  08/03/2014  Emma Campbell April 09, 1932 [id number]   Telephone call to patient for initial health coach outreach.  No answer.  HIPAA compliant voice message left.    Plan: RN Health Coach will outreach to patient another time.    [name], RN, MSN [place] [phone]

## 2014-08-03 NOTE — Patient Outreach (Signed)
[place] [facility]) Care Management  08/03/2014  Emma Campbell June 06, 1932 [id number]   Phone call to patient to discuss need for referrals for her hearing aids.  [place] program through the health and [employer] [facility]  Discussed.  It was also dicussed that she needs a recent hearing test through a provider contracted with this program.  Patient recently had a hearing test, however not with an contracted doctor's office.  Phone call made to this provider to confirm, however there was no answer. This social worker will call provider back within the next week.  CSW to continue to identify resources related to hearing aids for patient.   [name] [place] [facility] Care Management [phone]

## 2014-08-03 NOTE — Patient Outreach (Signed)
[place] [facility]) Care Management  08/03/2014  Emma Campbell 06-21-1932 [id number]   Phone call to patient to discuss resources for hearing aids.  Voicemail message left for a return call.   [name] [facility] Care Management [phone]

## 2014-08-06 DIAGNOSIS — R001 Bradycardia, unspecified: Secondary | ICD-10-CM | POA: Diagnosis not present

## 2014-08-06 DIAGNOSIS — I480 Paroxysmal atrial fibrillation: Secondary | ICD-10-CM | POA: Diagnosis not present

## 2014-08-06 DIAGNOSIS — I495 Sick sinus syndrome: Secondary | ICD-10-CM | POA: Diagnosis not present

## 2014-08-06 DIAGNOSIS — E119 Type 2 diabetes mellitus without complications: Secondary | ICD-10-CM | POA: Diagnosis not present

## 2014-08-06 DIAGNOSIS — R946 Abnormal results of thyroid function studies: Secondary | ICD-10-CM | POA: Diagnosis not present

## 2014-08-06 DIAGNOSIS — E785 Hyperlipidemia, unspecified: Secondary | ICD-10-CM | POA: Diagnosis not present

## 2014-08-07 LAB — TSH: TSH: 5.44 u[IU]/mL — AB (ref 0.450–4.500)

## 2014-08-12 ENCOUNTER — Other Ambulatory Visit: Payer: Self-pay | Admitting: *Deleted

## 2014-08-12 NOTE — Patient Outreach (Signed)
[place] [facility]) Care Management  08/12/2014  Emma Campbell 01-08-1933 [id number]    Phone call to patient to follow up on community resource for a hearing aid.  The equipment distribution service through the [place] [facility] discussed.  It was also discussed that patient would have to obtain a hearing test through one of their contracted agencies in order to be eligible for the program. [facility] is one of those contracted agencies.  Per patient she has already has been referred to  [place] [facility] by her primary care provider.  Patient plans to call them to schedule an appointment for next month. Patient reports no further social work needs.   [name] [facility] Care Management [phone]

## 2014-09-01 ENCOUNTER — Other Ambulatory Visit: Payer: Self-pay

## 2014-09-01 NOTE — Patient Outreach (Signed)
[place] [facility]) Care Management  [place]  09/01/2014   ELLYANNA Campbell 03/05/1932 [id number]  Telephone call to patient for monthly outreach.  Patient reports she is doing good.  She reports last blood sugar was 130.  Patient reports she is not exercising regularly right now.  Coached on importance of exercise and blood sugar. Also discussed how exercise helps with heart as well.  She verbalized understanding.  Patient states she sticks with her diabetic diet and watches sweet intake.  No concerns.  Consent: Talked with patient about receiving consent. She states she did.  Instructed on importance of sending consent back.  She verbalized understanding and will get it in the mail.     Current Medications:  Current Outpatient Prescriptions  Medication Sig Dispense Refill  . amiodarone (PACERONE) 200 MG tablet Take 200 mg by mouth daily.    Marland Kitchen amLODipine-valsartan (EXFORGE) 5-320 MG per tablet Take 1 tablet by mouth daily. 90 tablet 1  . aspirin 81 MG tablet Take 81 mg by mouth daily.    . cholecalciferol (VITAMIN D) 1000 UNITS tablet Take 200 Units by mouth daily.    . folic acid (FOLVITE) 1 MG tablet Take 1 mg by mouth daily.    . metFORMIN (GLUCOPHAGE) 500 MG tablet Take 500 mg by mouth 1 day or 1 dose.    . rosuvastatin (CRESTOR) 5 MG tablet Take 1 tablet (5 mg total) by mouth daily. 90 tablet 4   No current facility-administered medications for this visit.    Functional Status:  In your present state of health, do you have any difficulty performing the following activities: 09/01/2014 09/01/2014  Hearing? N -  Vision? N -  Difficulty concentrating or making decisions? N -  Walking or climbing stairs? N -  Dressing or bathing? N -  Doing errands, shopping? N -  Preparing Food and eating ? N -  Using the Toilet? - N  In the past six months, have you accidently leaked urine? N N  Do you have problems with loss of bowel control? N N  Managing your Medications? N N   Managing your Finances? N N  Housekeeping or managing your Housekeeping? N N    Fall/Depression Screening: PHQ 2/9 Scores 09/01/2014 08/03/2014 07/31/2014  PHQ - 2 Score 0 0 0   THN CM Care Plan Problem One        Patient Outreach Telephone from 09/01/2014 in [place] Problem One  Knowledge Deficit related to Diabetes   Care Plan for Problem One  Active   THN Long Term Goal (31-90 days)  Patient will be able to maintain blood sugars less than 150 within 90 days.   THN Long Term Goal Start Date  08/03/14   Interventions for Problem One Long Term Goal  Reinforced with patient complications of diabetes and importance of maintaining blood sugar control.  Also reinforced kidney function in relation to diabetes.     THN CM Short Term Goal #1 (0-30 days)  Patient will be able to report maintaining low carbohydrate diet within 30 days.   THN CM Short Term Goal #1 Start Date  08/03/14   [facility] CM Short Term Goal #1 Met Date  09/01/14   Interventions for Short Term Goal #1  Discussed avoidance of high carbohydrate foods such as potatoes, rice, and pastas.   THN CM Short Term Goal #2 (0-30 days)  Patient will report walking at least 3 times a week  with the next 30 days.   THN CM Short Term Goal #2 Start Date  09/01/14 [goal restarted]   Interventions for Short Term Goal #2  Reemphasized importance of maintaining regular exercise routine to help in keeping blood sugar under control and how exercise helps with her heart as well.       Assessment: Patient will continue to benefit from monthly outreach for disease management of diabetes.  Plan: RN Health Coach will contact patient within one month for next outreach and patient agrees.  [name], RN, MSN [place] [phone]

## 2014-09-09 ENCOUNTER — Other Ambulatory Visit: Payer: Self-pay | Admitting: *Deleted

## 2014-09-23 ENCOUNTER — Other Ambulatory Visit: Payer: Self-pay | Admitting: Family Medicine

## 2014-09-23 NOTE — Telephone Encounter (Signed)
Patient requesting refill. 

## 2014-09-28 ENCOUNTER — Other Ambulatory Visit: Payer: Self-pay

## 2014-09-28 NOTE — Patient Outreach (Signed)
[place] [facility]) Care Management  [place]  09/28/2014   Emma Campbell [birth year] [id number]   Telephone call to patient for monthly call.  Patient reports she is doing well.  Patient reports that she is walking now in the early mornings around 8: 00 am about 3 times a week.  Patient reported blood sugar this morning was 129.  Patient reports that her sugars are a little lower due to her exercising now.  Discussed with patient how exercise helps her blood sugars.  Also discussed with patient following a diabetic diet.  She verbalized understanding.  No concerns.      Current Medications:  Current Outpatient Prescriptions  Medication Sig Dispense Refill  . amiodarone (PACERONE) 200 MG tablet Take 200 mg by mouth daily.    Marland Kitchen amLODipine-valsartan (EXFORGE) 5-320 MG per tablet Take 1 tablet by mouth daily. 90 tablet 1  . cholecalciferol (VITAMIN D) 1000 UNITS tablet Take 200 Units by mouth daily.    . folic acid (FOLVITE) 1 MG tablet Take 1 mg by mouth daily.    . metFORMIN (GLUCOPHAGE) 500 MG tablet TAKE 1 TABLET EVERY EVENING FOR DIABETES 90 tablet 0  . rosuvastatin (CRESTOR) 5 MG tablet Take 1 tablet (5 mg total) by mouth daily. 90 tablet 4  . aspirin 81 MG tablet Take 81 mg by mouth daily.     No current facility-administered medications for this visit.    Functional Status:  In your present state of health, do you have any difficulty performing the following activities: 09/01/2014 09/01/2014  Hearing? N -  Vision? N -  Difficulty concentrating or making decisions? N -  Walking or climbing stairs? N -  Dressing or bathing? N -  Doing errands, shopping? N -  Preparing Food and eating ? N -  Using the Toilet? - N  In the past six months, have you accidently leaked urine? N N  Do you have problems with loss of bowel control? N N  Managing your Medications? N N  Managing your Finances? N N  Housekeeping or managing your Housekeeping? N N    Fall/Depression  Screening: PHQ 2/9 Scores 09/28/2014 09/01/2014 08/03/2014 07/31/2014  PHQ - 2 Score 0 0 0 0    THN CM Care Plan Problem One        Patient Outreach Telephone from 09/28/2014 in [place] Problem One  Knowledge Deficit related to Diabetes   Care Plan for Problem One  Active   THN Long Term Goal (31-90 days)  Patient will be able to maintain blood sugars less than 150 within 90 days.   THN Long Term Goal Start Date  08/03/14   Interventions for Problem One Long Term Goal  Reemphasized with patient complications of diabetes and importance of maintaining blood sugar control.  Also reinforced kidney function in relation to diabetes.     THN CM Short Term Goal #1 (0-30 days)  Patient will be able to report maintaining low carbohydrate diet within 30 days.   THN CM Short Term Goal #1 Start Date  08/03/14   [facility] CM Short Term Goal #1 Met Date  09/01/14   Interventions for Short Term Goal #1  Discussed avoidance of high carbohydrate foods such as potatoes, rice, and pastas.   THN CM Short Term Goal #2 (0-30 days)  Patient will report walking at least 3 times a week with the next 30 days.   THN CM Short Term Goal #2  Start Date  09/01/14 [goal restarted]   THN CM Short Term Goal #2 Met Date  09/28/14   Interventions for Short Term Goal #2  Reemphasized importance of maintaining regular exercise routine to help in keeping blood sugar under control and how exercise helps with her heart as well.   THN CM Short Term Goal #3 (0-30 days)  Patient will be able to report foods with high carbohydrates within the next 30 days.   THN CM Short Term Goal #3 Start Date  09/28/14   Interventions for Short Tern Goal #3  Health Coach named foods such as potatoes, breads, rice, and pastas that are high in carbohydrates.        Assessment: Patient continues to benefit from health coach calls to assist with self management of diabetes.    Plan: RN Health Coach will contact patient within the next month  and patient agrees to next outreach.    [name], RN, MSN [place] [phone]

## 2014-10-26 ENCOUNTER — Other Ambulatory Visit: Payer: Self-pay

## 2014-10-26 ENCOUNTER — Ambulatory Visit: Payer: Commercial Managed Care - HMO

## 2014-10-26 NOTE — Patient Outreach (Signed)
[facility]) Care Management  10/26/2014  Emma Campbell [birth year] [id number]   Telephone call to patient for disease management follow up.  Unable to reach.  HIPPA compliant voice message left.  Plan: RN Health Coach will attempt telephone outreach within 1-2 weeks.    [name], RN, MSN [place] [phone]

## 2014-11-06 ENCOUNTER — Ambulatory Visit: Payer: Commercial Managed Care - HMO

## 2014-11-06 ENCOUNTER — Other Ambulatory Visit: Payer: Self-pay

## 2014-11-06 NOTE — Patient Outreach (Signed)
[place] [facility]) Care Management  11/06/2014  Emma Campbell 1932/11/06 [id number]  2nd Telephone call to patient for monthly outreach.  No answer. HIPAA compliant voice message left.    Plan: RN Health Coach will contact patient within 1-2 weeks.    [name], RN, MSN [place] [phone]

## 2014-11-12 ENCOUNTER — Other Ambulatory Visit: Payer: Self-pay

## 2014-11-12 NOTE — Patient Outreach (Addendum)
[place] [facility]) Care Management  [place]  11/12/2014   Emma Campbell 10-17-1932 [id number]  Subjective:  Telephone call to patient for monthly outreach.  Patient reports she is doing good.  Denies hospitalization or MD appointment.  Patient reports that appointment with primary doctor is next month.    Diabetes: Patient reports that blood sugar averaging about 133 and she is checking blood sugar about every 2 days.  Patient continues to walk just about everyday in the morning.  Discussed diabetic diet and importance of diet and exercise in controlling her sugars.  She verbalized understanding.  Objective:   Current Medications:  Current Outpatient Prescriptions  Medication Sig Dispense Refill  . amiodarone (PACERONE) 200 MG tablet Take 200 mg by mouth daily.    Marland Kitchen amLODipine-valsartan (EXFORGE) 5-320 MG per tablet Take 1 tablet by mouth daily. 90 tablet 1  . aspirin 81 MG tablet Take 81 mg by mouth daily.    . cholecalciferol (VITAMIN D) 1000 UNITS tablet Take 200 Units by mouth daily.    . folic acid (FOLVITE) 1 MG tablet Take 1 mg by mouth daily.    . metFORMIN (GLUCOPHAGE) 500 MG tablet TAKE 1 TABLET EVERY EVENING FOR DIABETES 90 tablet 0  . rosuvastatin (CRESTOR) 5 MG tablet Take 1 tablet (5 mg total) by mouth daily. 90 tablet 4   No current facility-administered medications for this visit.    Functional Status:  In your present state of health, do you have any difficulty performing the following activities: 09/01/2014 09/01/2014  Hearing? N -  Vision? N -  Difficulty concentrating or making decisions? N -  Walking or climbing stairs? N -  Dressing or bathing? N -  Doing errands, shopping? N -  Preparing Food and eating ? N -  Using the Toilet? - N  In the past six months, have you accidently leaked urine? N N  Do you have problems with loss of bowel control? N N  Managing your Medications? N N  Managing your Finances? N N  Housekeeping or managing your  Housekeeping? N N    Fall/Depression Screening: PHQ 2/9 Scores 11/12/2014 09/28/2014 09/01/2014 08/03/2014 07/31/2014  PHQ - 2 Score 0 0 0 0 0    Assessment: Patient continues to benefit from health coach outreach for disease management.  Plan:   [facility] CM Care Plan Problem One        Most Recent Value   Care Plan Problem One  Knowledge Deficit related to Diabetes   Role Documenting the Problem One  Health [place] for Problem One  Active   THN Long Term Goal (31-90 days)  Patient will be able to maintain blood sugars less than 150 within 90 days.   THN Long Term Goal Start Date  11/12/14 [goal continued]   Interventions for Problem One Long Term Goal  Discussed with patient combination of diet and exercise to keep sugars down.     THN CM Short Term Goal #1 (0-30 days)  Patient will be able to report maintaining low carbohydrate diet within 30 days.   THN CM Short Term Goal #1 Start Date  08/03/14   [facility] CM Short Term Goal #1 Met Date  09/01/14   Interventions for Short Term Goal #1  Discussed avoidance of high carbohydrate foods such as potatoes, rice, and pastas.   THN CM Short Term Goal #2 (0-30 days)  Patient will report walking at least 3 times a week with the  next 30 days.   THN CM Short Term Goal #2 Start Date  09/01/14 [goal restarted]   [facility] CM Short Term Goal #2 Met Date  09/28/14   Interventions for Short Term Goal #2  Reemphasized importance of maintaining regular exercise routine to help in keeping blood sugar under control and how exercise helps with her heart as well.   THN CM Short Term Goal #3 (0-30 days)  Patient will be able to report foods with high carbohydrates within the next 30 days.   THN CM Short Term Goal #3 Start Date  11/12/14 [place] Folk continued ]   Interventions for Short Tern Goal #3  Discussed with patient food high in carbohydrates and advised to avoid sweets.       RN Health Coach discussed with patient moving outreach to every other month but advised patient  to call for any concerns before health coach outreach.  Patient agrees to plan and next scheduled outreach.    [name], RN, MSN [place] [phone]

## 2014-11-30 ENCOUNTER — Encounter: Payer: Self-pay | Admitting: Family Medicine

## 2014-11-30 ENCOUNTER — Ambulatory Visit (INDEPENDENT_AMBULATORY_CARE_PROVIDER_SITE_OTHER): Payer: Commercial Managed Care - HMO | Admitting: Family Medicine

## 2014-11-30 VITALS — BP 134/82 | HR 76 | Temp 98.1°F | Resp 16 | Ht 63.0 in | Wt 129.7 lb

## 2014-11-30 DIAGNOSIS — I10 Essential (primary) hypertension: Secondary | ICD-10-CM

## 2014-11-30 DIAGNOSIS — Z950 Presence of cardiac pacemaker: Secondary | ICD-10-CM

## 2014-11-30 DIAGNOSIS — R011 Cardiac murmur, unspecified: Secondary | ICD-10-CM

## 2014-11-30 DIAGNOSIS — R945 Abnormal results of liver function studies: Secondary | ICD-10-CM

## 2014-11-30 DIAGNOSIS — E1122 Type 2 diabetes mellitus with diabetic chronic kidney disease: Secondary | ICD-10-CM

## 2014-11-30 DIAGNOSIS — N183 Chronic kidney disease, stage 3 unspecified: Secondary | ICD-10-CM | POA: Insufficient documentation

## 2014-11-30 DIAGNOSIS — Z8679 Personal history of other diseases of the circulatory system: Secondary | ICD-10-CM | POA: Diagnosis not present

## 2014-11-30 DIAGNOSIS — N182 Chronic kidney disease, stage 2 (mild): Secondary | ICD-10-CM

## 2014-11-30 DIAGNOSIS — R7989 Other specified abnormal findings of blood chemistry: Secondary | ICD-10-CM | POA: Insufficient documentation

## 2014-11-30 DIAGNOSIS — R946 Abnormal results of thyroid function studies: Secondary | ICD-10-CM | POA: Diagnosis not present

## 2014-11-30 DIAGNOSIS — Z79899 Other long term (current) drug therapy: Secondary | ICD-10-CM

## 2014-11-30 DIAGNOSIS — I495 Sick sinus syndrome: Secondary | ICD-10-CM

## 2014-11-30 DIAGNOSIS — N6019 Diffuse cystic mastopathy of unspecified breast: Secondary | ICD-10-CM | POA: Insufficient documentation

## 2014-11-30 DIAGNOSIS — Z23 Encounter for immunization: Secondary | ICD-10-CM | POA: Diagnosis not present

## 2014-11-30 DIAGNOSIS — I480 Paroxysmal atrial fibrillation: Secondary | ICD-10-CM | POA: Diagnosis not present

## 2014-11-30 DIAGNOSIS — K5790 Diverticulosis of intestine, part unspecified, without perforation or abscess without bleeding: Secondary | ICD-10-CM

## 2014-11-30 LAB — POCT GLYCOSYLATED HEMOGLOBIN (HGB A1C): HEMOGLOBIN A1C: 6.1

## 2014-11-30 MED ORDER — METFORMIN HCL 500 MG PO TABS: 500.0000 mg | ORAL_TABLET | Freq: Every day | ORAL | Status: DC

## 2014-11-30 MED ORDER — AMLODIPINE BESYLATE-VALSARTAN 5-320 MG PO TABS: 1.0000 | ORAL_TABLET | Freq: Every day | ORAL | Status: DC

## 2014-11-30 NOTE — Progress Notes (Signed)
Name: Emma Campbell   MRN: [id number]    DOB: December 10, 1932   Date:11/30/2014       Progress Note  Subjective  Chief Complaint  Chief Complaint  Patient presents with  . Medication Refill    follow-up  . Diabetes    Checks BG 1x day avg-140  . Hypertension  . Hyperlipidemia    HPI  Elevated TSH: TSH elevated in 02 and again in 06 of 2016, she is taking Amiodarone 200 mg for sick sinus syndrome, but denies symptoms of hypothyroidism. No fatigue, no constipation or dry skin. She sees Dr. Clayborn Bigness we are contacting him to see if he can change Amiodarone to another medication  DMII with renal manifestation: CKI and microalbuminuria. HgbA1C is great today, she denies hypoglycemia episodes. She feels well.  She is complaint with her diet.  She denies polyphagia, polydipsia or polyuria.   HTN: taking medication and denies side effects of medication  Hyperlipidemia: taking Crestor  5 mg and denies myalgias. Last labs done in 03/2014 and at goal  Weight loss: reviewed previous chart ( [facility] ) and her weight has been between 128 and 135 lbs since last year. She started walking 30 minutes most days of the week for the past 3 months,  and may have been the cause of her mild weight loss.   Diverticulosis: denies any symptoms  Sick Sinus Syndrome: she had a pace maker placed in 2006 and is taking Amiodarone since 2006, last TSH was elevated, no chest pain or palpitation, no syncope.   Patient Active Problem List   Diagnosis Date Noted  . Chronic kidney disease (CKD), stage III (moderate) 11/30/2014  . High risk medication use 11/30/2014  . Diverticulosis 11/30/2014  . Fibrocystic breast disease 11/30/2014  . Elevated LFTs 11/30/2014  . Microalbuminuria 07/31/2014  . Elevated TSH 07/31/2014  . History of cardiac pacemaker 07/31/2014  . Sick sinus syndrome ([place]) 08/13/2013  . Hypercholesteremia 08/13/2013  . Hypertension, benign 08/13/2013  . Diabetes mellitus with renal manifestations,  controlled ([place]) 08/13/2013    Past Surgical History  Procedure Laterality Date  . Pacemaker insertion  2006    Family History  Problem Relation Age of Onset  . Hypertension Mother   . Stroke Mother   . Diabetes Daughter     Social History   Social History  . Marital Status: Widowed    Spouse Name: N/A  . Number of Children: 1  . Years of Education: 13   Occupational History  . Retired [employer] at [place] Topics  . Smoking status: Never Smoker   . Smokeless tobacco: Not on file  . Alcohol Use: No  . Drug Use: No  . Sexual Activity: No   Other Topics Concern  . Not on file   Social History Narrative     Current outpatient prescriptions:  .  amiodarone (PACERONE) 200 MG tablet, Take 200 mg by mouth daily., Disp: , Rfl:  .  amLODipine-valsartan (EXFORGE) 5-320 MG tablet, Take 1 tablet by mouth daily., Disp: 90 tablet, Rfl: 1 .  aspirin 81 MG tablet, Take 81 mg by mouth daily., Disp: , Rfl:  .  cholecalciferol (VITAMIN D) 1000 UNITS tablet, Take 200 Units by mouth daily., Disp: , Rfl:  .  folic acid (FOLVITE) 1 MG tablet, Take 1 mg by mouth daily., Disp: , Rfl:  .  metFORMIN (GLUCOPHAGE) 500 MG tablet, Take 1 tablet (500 mg total) by mouth daily with breakfast.,  Disp: 90 tablet, Rfl: 1 .  rosuvastatin (CRESTOR) 5 MG tablet, Take 1 tablet (5 mg total) by mouth daily., Disp: 90 tablet, Rfl: 4  No Known Allergies   ROS  Constitutional: Negative for fever or weight change.  Respiratory: Negative for cough and shortness of breath.   Cardiovascular: Negative for chest pain or palpitations.  Gastrointestinal: Negative for abdominal pain, no bowel changes.  Musculoskeletal: Negative for gait problem or joint swelling.  Skin: Negative for rash.  Neurological: Negative for dizziness or headache.  No other specific complaints in a complete review of systems (except as listed in HPI above).  Objective  Filed Vitals:   11/30/14 1032   BP: 134/82  Pulse: 76  Temp: 98.1 F (36.7 C)  TempSrc: Oral  Resp: 16  Height: 5\' 3"  (1.6 m)  Weight: 129 lb 11.2 oz (58.832 kg)  SpO2: 97%    Body mass index is 22.98 kg/(m^2).  Physical Exam   Constitutional: Patient appears well-developed and well-nourished. No distress.  HENT: Head: Normocephalic and atraumatic. Ears: B TMs ok, no erythema or effusion; Nose: Nose normal. Mouth/Throat: Oropharynx is clear and moist. No oropharyngeal exudate.  Eyes: Conjunctivae and EOM are normal. Pupils are equal, round, and reactive to light. No scleral icterus.  Neck: Normal range of motion. Neck supple. No JVD present. No thyromegaly present.  Cardiovascular: Normal rate, regular rhythm and normal heart sounds.Heart murmur on second left  intercostal space 2/6. No BLE edema. Pulmonary/Chest: Effort normal and breath sounds normal. No respiratory distress. Abdominal: Soft. Bowel sounds are normal, no distension. There is no tenderness. no masses Musculoskeletal: Normal range of motion, no joint effusions. No gross deformities Neurological: he is alert and oriented to person, place, and time. No cranial nerve deficit. Coordination, balance, strength, speech and gait are normal.  Skin: Skin is warm and dry. No rash noted. No erythema.  Psychiatric: Patient has a normal mood and affect. behavior is normal. Judgment and thought content normal.  Recent Results (from the past 2160 hour(s))  POCT HgB A1C     Status: None   Collection Time: 11/30/14 10:35 AM  Result Value Ref Range   Hemoglobin A1C 6.1     Diabetic Foot Exam: Diabetic Foot Exam - Simple   Simple Foot Form  Visual Inspection  See comments:  Yes  Sensation Testing  Intact to touch and monofilament testing bilaterally:  Yes  Pulse Check  Posterior Tibialis and Dorsalis pulse intact bilaterally:  Yes  Comments  Thick toe nails       PHQ2/9: Depression screen [facility] 2/9 11/30/2014 11/12/2014 09/28/2014 09/01/2014 08/03/2014   Decreased Interest 0 0 0 0 0  Down, Depressed, Hopeless 0 0 0 0 0  PHQ - 2 Score 0 0 0 0 0     Fall Risk: Fall Risk  11/30/2014 11/12/2014 09/28/2014 09/01/2014 08/03/2014  Falls in the past year? No No No No No     Functional Status Survey: Is the patient deaf or have difficulty hearing?: No Does the patient have difficulty seeing, even when wearing glasses/contacts?: Yes (glasses) Does the patient have difficulty concentrating, remembering, or making decisions?: No Does the patient have difficulty walking or climbing stairs?: No Does the patient have difficulty dressing or bathing?: No Does the patient have difficulty doing errands alone such as visiting a doctor's office or shopping?: No   Assessment & Plan  1. Type 2 diabetes mellitus with stage 2 chronic kidney disease, without long-term current use of insulin ([place])  Discussed stopping Metformin, but she wants to continue, she will try it every other day and monitor sugar at home - POCT HgB A1C - metFORMIN (GLUCOPHAGE) 500 MG tablet; Take 1 tablet (500 mg total) by mouth daily with breakfast.  Dispense: 90 tablet; Refill: 1  2. Needs flu shot  - Flu vaccine HIGH DOSE PF (Fluzone High dose)  3. Chronic kidney disease (CKD), stage III (moderate)  Recheck labs  4. High risk medication use   Amiodarone may be the cause of abnormal TSH  5. Diverticulosis of intestine without bleeding, unspecified intestinal tract location  Asymptomatic   6. Fibrocystic breast disease, unspecified laterality  stable  7. Sick sinus syndrome [facility])  Sees Dr Clayborn Bigness, spoke to him today, and he will consider going down to 100 mg daily, however because she does not have symptoms of hypothyroidism he states we can continue to monitor it for now  8. Elevated TSH  - Thyroid Panel With TSH  9. History of cardiac pacemaker   2006  10. Essential hypertension   at goal and no side effects - amLODipine-valsartan (EXFORGE) 5-320 MG tablet;  Take 1 tablet by mouth daily.  Dispense: 90 tablet; Refill: 1 - Comprehensive metabolic panel  11. Elevated LFTs  Liver enzymes stable and [place] was negative in the past

## 2014-12-01 LAB — THYROID PANEL WITH TSH
Free Thyroxine Index: 3 (ref 1.2–4.9)
T3 UPTAKE RATIO: 22 % — AB (ref 24–39)
T4 TOTAL: 13.6 ug/dL — AB (ref 4.5–12.0)
TSH: 3.57 u[IU]/mL (ref 0.450–4.500)

## 2014-12-01 LAB — COMPREHENSIVE METABOLIC PANEL
A/G RATIO: 1.7 (ref 1.1–2.5)
ALK PHOS: 83 IU/L (ref 39–117)
ALT: 34 IU/L — ABNORMAL HIGH (ref 0–32)
AST: 35 IU/L (ref 0–40)
Albumin: 4.3 g/dL (ref 3.5–4.7)
BUN / CREAT RATIO: 20 (ref 11–26)
BUN: 16 mg/dL (ref 8–27)
Bilirubin Total: 0.3 mg/dL (ref 0.0–1.2)
CO2: 24 mmol/L (ref 18–29)
CREATININE: 0.81 mg/dL (ref 0.57–1.00)
Calcium: 9.4 mg/dL (ref 8.7–10.3)
Chloride: 100 mmol/L (ref 97–108)
GFR calc Af Amer: 79 mL/min/{1.73_m2} (ref >59)
GFR, EST NON AFRICAN AMERICAN: 69 mL/min/{1.73_m2} (ref >59)
GLOBULIN, TOTAL: 2.5 g/dL (ref 1.5–4.5)
Glucose: 115 mg/dL — ABNORMAL HIGH (ref 65–99)
Potassium: 4.3 mmol/L (ref 3.5–5.2)
SODIUM: 140 mmol/L (ref 134–144)
Total Protein: 6.8 g/dL (ref 6.0–8.5)

## 2014-12-03 NOTE — Progress Notes (Signed)
Patient notified

## 2015-01-07 ENCOUNTER — Other Ambulatory Visit: Payer: Self-pay

## 2015-01-07 NOTE — Patient Outreach (Signed)
[place] [facility]) Care Management  Emma Campbell  01/07/2015   Emma Campbell [birth year] [id number]  Subjective: Telephone call to patient for every other month outreach.  Patient reports she is doing good. Patient reports last blood pressure 134/67.  Patient reports flu injection on 11-30-14.  Discussed with patient managing colds and flu.  She verbalized understanding.    Diabetes: Patient reports last blood sugar 128.  A1c reported at 6.1.  Discussed with patient importance of diet and exercise to maintain goal of keeping blood sugars less than 150. She verbalized understanding.    Objective:   Current Medications:  Current Outpatient Prescriptions  Medication Sig Dispense Refill  . amiodarone (PACERONE) 200 MG tablet Take 200 mg by mouth daily.    Marland Kitchen amLODipine-valsartan (EXFORGE) 5-320 MG tablet Take 1 tablet by mouth daily. 90 tablet 1  . aspirin 81 MG tablet Take 81 mg by mouth daily.    . cholecalciferol (VITAMIN D) 1000 UNITS tablet Take 200 Units by mouth daily.    . folic acid (FOLVITE) 1 MG tablet Take 1 mg by mouth daily.    . metFORMIN (GLUCOPHAGE) 500 MG tablet Take 1 tablet (500 mg total) by mouth daily with breakfast. 90 tablet 1  . rosuvastatin (CRESTOR) 5 MG tablet Take 1 tablet (5 mg total) by mouth daily. 90 tablet 4   No current facility-administered medications for this visit.    Functional Status:  In your present state of health, do you have any difficulty performing the following activities: 01/07/2015 11/30/2014  Hearing? N N  Vision? N Y  Difficulty concentrating or making decisions? N N  Walking or climbing stairs? N N  Dressing or bathing? N N  Doing errands, shopping? N N  Preparing Food and eating ? N -  Using the Toilet? N -  In the past six months, have you accidently leaked urine? N -  Do you have problems with loss of bowel control? N -  Managing your Medications? N -  Managing your Finances? N -  Housekeeping or managing your  Housekeeping? N -    Fall/Depression Screening: PHQ 2/9 Scores 01/07/2015 11/30/2014 11/12/2014 09/28/2014 09/01/2014 08/03/2014 07/31/2014  PHQ - 2 Score 0 0 0 0 0 0 0    Assessment:  Patient continues to benefit from health coach outreach for disease management.    Plan:  [facility] CM Care Plan Problem One        Most Recent Value   Care Plan Problem One  Knowledge Deficit related to Diabetes   Role Documenting the Problem One  Health [place] for Problem One  Active   THN Long Term Goal (31-90 days)  Patient will be able to maintain blood sugars less than 150 within 90 days.   THN Long Term Goal Start Date  01/07/15 [goal continued]   Interventions for Problem One Long Term Goal  RN Health Coach discussed with patient importance of continued diet and exercise to maintain blood sugars less than 150.     THN CM Short Term Goal #1 (0-30 days)  Patient will be able to report maintaining low carbohydrate diet within 30 days.   THN CM Short Term Goal #1 Start Date  08/03/14   [facility] CM Short Term Goal #1 Met Date  09/01/14   Interventions for Short Term Goal #1  Discussed avoidance of high carbohydrate foods such as potatoes, rice, and pastas.   THN CM Short Term Goal #2 (0-30 days)  Patient will report walking at least 3 times a week with the next 30 days.   THN CM Short Term Goal #2 Start Date  09/01/14 [goal restarted]   [facility] CM Short Term Goal #2 Met Date  09/28/14   Interventions for Short Term Goal #2  Reemphasized importance of maintaining regular exercise routine to help in keeping blood sugar under control and how exercise helps with her heart as well.   THN CM Short Term Goal #3 (0-30 days)  Patient will be able to report foods with high carbohydrates within the next 30 days.   THN CM Short Term Goal #3 Start Date  11/12/14 Barrie Folk continued ]   [facility] CM Short Term Goal #3 Met Date  01/07/15   Interventions for Short [place]ern Goal #3  Discussed with patient food high in carbohydrates and advised to  avoid sweets.       RN Health Coach will send patient EMMI information on managing colds and flu.   RN Health coach will outreach to patient in January 2017 and patient agrees to next outreach.    [name], RN, MSN [place] [phone]

## 2015-02-04 DIAGNOSIS — I10 Essential (primary) hypertension: Secondary | ICD-10-CM | POA: Diagnosis not present

## 2015-02-04 DIAGNOSIS — M1990 Unspecified osteoarthritis, unspecified site: Secondary | ICD-10-CM | POA: Diagnosis not present

## 2015-02-04 DIAGNOSIS — I480 Paroxysmal atrial fibrillation: Secondary | ICD-10-CM | POA: Diagnosis not present

## 2015-02-04 DIAGNOSIS — R001 Bradycardia, unspecified: Secondary | ICD-10-CM | POA: Diagnosis not present

## 2015-02-04 DIAGNOSIS — R011 Cardiac murmur, unspecified: Secondary | ICD-10-CM | POA: Diagnosis not present

## 2015-02-04 DIAGNOSIS — I495 Sick sinus syndrome: Secondary | ICD-10-CM | POA: Diagnosis not present

## 2015-02-04 DIAGNOSIS — E119 Type 2 diabetes mellitus without complications: Secondary | ICD-10-CM | POA: Diagnosis not present

## 2015-02-04 DIAGNOSIS — E785 Hyperlipidemia, unspecified: Secondary | ICD-10-CM | POA: Diagnosis not present

## 2015-03-04 ENCOUNTER — Other Ambulatory Visit: Payer: Self-pay

## 2015-03-04 NOTE — Patient Outreach (Addendum)
[facility]) Care Management  [place]  03/04/2015   Emma Campbell [birth year] [id number]  Subjective: Return telephone call from patient for every other month call for disease management of diabetes.  Patient reports she is doing good.  No hospitalizations.  Patient reports she is set to see her primary care doctor on April 05, 2015. Patient reports that her blood sugar at last check was around 130.  Patient reports she has not been exercising as usual due to the colder weather.  Discussed with patient importance of maintaining diabetic diet and exercising.  She verbalized understanding.    Objective:   Current Medications:  Current Outpatient Prescriptions  Medication Sig Dispense Refill  . amiodarone (PACERONE) 200 MG tablet Take 200 mg by mouth daily.    Marland Kitchen amLODipine-valsartan (EXFORGE) 5-320 MG tablet Take 1 tablet by mouth daily. 90 tablet 1  . aspirin 81 MG tablet Take 81 mg by mouth daily.    . cholecalciferol (VITAMIN D) 1000 UNITS tablet Take 200 Units by mouth daily.    . folic acid (FOLVITE) 1 MG tablet Take 1 mg by mouth daily.    . metFORMIN (GLUCOPHAGE) 500 MG tablet Take 1 tablet (500 mg total) by mouth daily with breakfast. 90 tablet 1  . rosuvastatin (CRESTOR) 5 MG tablet Take 1 tablet (5 mg total) by mouth daily. 90 tablet 4   No current facility-administered medications for this visit.    Functional Status:  In your present state of health, do you have any difficulty performing the following activities: 03/04/2015 01/07/2015  Hearing? - N  Vision? - N  Difficulty concentrating or making decisions? - N  Walking or climbing stairs? - N  Dressing or bathing? - N  Doing errands, shopping? - N  Conservation officer, nature and eating ? N N  Using the Toilet? N N  In the past six months, have you accidently leaked urine? N N  Do you have problems with loss of bowel control? N N  Managing your Medications? - N  Managing your Finances? N N  Housekeeping or  managing your Housekeeping? N N    Fall/Depression Screening: PHQ 2/9 Scores 03/04/2015 01/07/2015 11/30/2014 11/12/2014 09/28/2014 09/01/2014 08/03/2014  PHQ - 2 Score 0 0 0 0 0 0 0    Assessment: Patient continues to benefit from health coach outreach for disease management.    Plan:    [facility] CM Care Plan Problem One        Most Recent Value   Care Plan Problem One  Knowledge Deficit related to Diabetes   Role Documenting the Problem One  Health [place] for Problem One  Active   THN Long Term Goal (31-90 days)  Patient will be able to maintain blood sugars less than 150 within 90 days.   THN Long Term Goal Start Date  03/04/15 [goal continued]   Interventions for Problem One Long Term Goal  RN Health Coach reinforced diabetic diet and importance of exercise.       RN Health Coach will contact patient March 2017 and patient agrees to next outreach.    [name], RN, MSN [place] [phone]

## 2015-03-04 NOTE — Patient Outreach (Signed)
[place] [facility]) Care Management  03/04/2015  KAILEI ATEHORTUA Jul 10, 1932 [id number]  Telephone call to patient for every other month outreach.  No answer. HIPAA compliant voice message left.   Plan: RN Health Coach will contact patient within 1-2 weeks.  [name], RN, MSN [place] [phone]

## 2015-04-05 ENCOUNTER — Ambulatory Visit (INDEPENDENT_AMBULATORY_CARE_PROVIDER_SITE_OTHER): Payer: Commercial Managed Care - HMO | Admitting: Family Medicine

## 2015-04-05 ENCOUNTER — Encounter: Payer: Self-pay | Admitting: Family Medicine

## 2015-04-05 VITALS — BP 140/80 | HR 91 | Temp 98.7°F | Resp 14 | Ht 63.0 in | Wt 131.1 lb

## 2015-04-05 DIAGNOSIS — R946 Abnormal results of thyroid function studies: Secondary | ICD-10-CM | POA: Diagnosis not present

## 2015-04-05 DIAGNOSIS — E785 Hyperlipidemia, unspecified: Secondary | ICD-10-CM | POA: Diagnosis not present

## 2015-04-05 DIAGNOSIS — N182 Chronic kidney disease, stage 2 (mild): Secondary | ICD-10-CM

## 2015-04-05 DIAGNOSIS — R7989 Other specified abnormal findings of blood chemistry: Secondary | ICD-10-CM

## 2015-04-05 DIAGNOSIS — I495 Sick sinus syndrome: Secondary | ICD-10-CM | POA: Diagnosis not present

## 2015-04-05 DIAGNOSIS — I10 Essential (primary) hypertension: Secondary | ICD-10-CM

## 2015-04-05 DIAGNOSIS — E1122 Type 2 diabetes mellitus with diabetic chronic kidney disease: Secondary | ICD-10-CM | POA: Diagnosis not present

## 2015-04-05 DIAGNOSIS — R945 Abnormal results of liver function studies: Secondary | ICD-10-CM

## 2015-04-05 DIAGNOSIS — Z950 Presence of cardiac pacemaker: Secondary | ICD-10-CM

## 2015-04-05 DIAGNOSIS — Z8679 Personal history of other diseases of the circulatory system: Secondary | ICD-10-CM

## 2015-04-05 LAB — GLUCOSE, POCT (MANUAL RESULT ENTRY): POC Glucose: 117 mg/dl — AB (ref 70–99)

## 2015-04-05 LAB — POCT GLYCOSYLATED HEMOGLOBIN (HGB A1C): HEMOGLOBIN A1C: 6.7

## 2015-04-05 MED ORDER — METFORMIN HCL 500 MG PO TABS: 500.0000 mg | ORAL_TABLET | Freq: Every day | ORAL | Status: DC

## 2015-04-05 MED ORDER — AMLODIPINE BESYLATE-VALSARTAN 5-320 MG PO TABS: 1.0000 | ORAL_TABLET | Freq: Every day | ORAL | Status: DC

## 2015-04-05 NOTE — Progress Notes (Signed)
Name: Emma Campbell   MRN: [id number]    DOB: 05-25-1932   Date:04/05/2015       Progress Note  Subjective  Chief Complaint  Chief Complaint  Patient presents with  . Hypertension    4 month recheck  . Hyperlipidemia  . Diabetes    HPI  Elevated TSH: TSH elevated in Feb  and again in 06 of 2016, but normal TSH in October, but abnormal fractions. Discussed with Dr. Clayborn Bigness and advised to monitor for now, he will adjust dose of Amiodarone if needed. She takes it for sick sinus syndrome.   DMII with renal manifestation: CKI and microalbuminuria. HgbA1C is great today, she denies hypoglycemia episodes. She feels well. She is complaint with her diet. She denies polyphagia, polydipsia or polyuria. She is due for an eye exam and will schedule it herself   HTN: taking medication and denies side effects of medication, bp at home is around 130's. No orthostatic changes , no chest pain or SOB  Hyperlipidemia: taking Crestor 5 mg and denies myalgias. Last labs done in 03/2014 and at goal, we will recheck levels today.   Sick Sinus Syndrome: she had a pace maker placed in 2006 and is taking Amiodarone since 2006, last TSH was elevated, no chest pain or palpitation, no syncope. Sees Dr. Clayborn Bigness    Patient Active Problem List   Diagnosis Date Noted  . Chronic kidney disease (CKD), stage III (moderate) 11/30/2014  . High risk medication use 11/30/2014  . Diverticulosis 11/30/2014  . Fibrocystic breast disease 11/30/2014  . Elevated LFTs 11/30/2014  . Systolic ejection murmur [id number]  . Paroxysmal a-fib ([place]) 11/30/2014  . Microalbuminuria 07/31/2014  . Elevated TSH 07/31/2014  . History of cardiac pacemaker 07/31/2014  . Sick sinus syndrome ([place]) 08/13/2013  . Hypercholesteremia 08/13/2013  . Hypertension, benign 08/13/2013  . Diabetes mellitus with renal manifestations, controlled ([place]) 08/13/2013    Past Surgical History  Procedure Laterality Date  . Pacemaker insertion  2006     Family History  Problem Relation Age of Onset  . Hypertension Mother   . Stroke Mother   . Diabetes Daughter     Social History   Social History  . Marital Status: Widowed    Spouse Name: N/A  . Number of Children: 1  . Years of Education: 13   Occupational History  . Retired [employer] at [place] Topics  . Smoking status: Never Smoker   . Smokeless tobacco: Not on file  . Alcohol Use: No  . Drug Use: No  . Sexual Activity: No   Other Topics Concern  . Not on file   Social History Narrative     Current outpatient prescriptions:  .  amiodarone (PACERONE) 200 MG tablet, Take 200 mg by mouth daily., Disp: , Rfl:  .  amLODipine-valsartan (EXFORGE) 5-320 MG tablet, Take 1 tablet by mouth daily., Disp: 90 tablet, Rfl: 1 .  aspirin 81 MG tablet, Take 81 mg by mouth daily., Disp: , Rfl:  .  cholecalciferol (VITAMIN D) 1000 UNITS tablet, Take 200 Units by mouth daily., Disp: , Rfl:  .  folic acid (FOLVITE) 1 MG tablet, Take 1 mg by mouth daily., Disp: , Rfl:  .  metFORMIN (GLUCOPHAGE) 500 MG tablet, Take 1 tablet (500 mg total) by mouth daily with breakfast., Disp: 90 tablet, Rfl: 1 .  rosuvastatin (CRESTOR) 5 MG tablet, Take 1 tablet (5 mg total) by mouth daily., Disp: 90 tablet,  Rfl: 4  No Known Allergies   ROS  Constitutional: Negative for fever or weight change.  Respiratory: Negative for cough and shortness of breath.   Cardiovascular: Negative for chest pain or palpitations.  Gastrointestinal: Negative for abdominal pain, no bowel changes.  Musculoskeletal: Negative for gait problem or joint swelling.  Skin: Negative for rash.  Neurological: Negative for dizziness or headache.  No other specific complaints in a complete review of systems (except as listed in HPI above).  Objective  Filed Vitals:   04/05/15 1023  BP: 140/80  Pulse: 91  Temp: 98.7 F (37.1 C)  TempSrc: Oral  Resp: 14  Height: 5\' 3"  (1.6 m)  Weight: 131  lb 1.6 oz (59.467 kg)  SpO2: 95%    Body mass index is 23.23 kg/(m^2).  Physical Exam  Constitutional: Patient appears well-developed and well-nourished. No distress.  HEENT: head atraumatic, normocephalic, pupils equal and reactive to light,neck supple, throat within normal limits Cardiovascular: Normal rate, regular rhythm with holosystolic systolic ejection murmur No BLE edema. Pacemaker on Left chest wall, no -tenderness Pulmonary/Chest: Effort normal and breath sounds normal. No respiratory distress. Abdominal: Soft.  There is no tenderness. Psychiatric: Patient has a normal mood and affect. behavior is normal. Judgment and thought content normal.  Recent Results (from the past 2160 hour(s))  POCT HgB A1C     Status: None   Collection Time: 04/05/15 10:27 AM  Result Value Ref Range   Hemoglobin A1C 6.7   POCT Glucose (CBG)     Status: Abnormal   Collection Time: 04/05/15 10:27 AM  Result Value Ref Range   POC Glucose 117 (A) 70 - 99 mg/dl    PHQ2/9: Depression screen [facility] 2/9 04/05/2015 03/04/2015 01/07/2015 11/30/2014 11/12/2014  Decreased Interest 0 0 0 0 0  Down, Depressed, Hopeless 0 0 0 0 0  PHQ - 2 Score 0 0 0 0 0     Fall Risk: Fall Risk  04/05/2015 03/04/2015 01/07/2015 11/30/2014 11/12/2014  Falls in the past year? No No No No No     Functional Status Survey: Is the patient deaf or have difficulty hearing?: No Does the patient have difficulty seeing, even when wearing glasses/contacts?: No Does the patient have difficulty concentrating, remembering, or making decisions?: No Does the patient have difficulty walking or climbing stairs?: No Does the patient have difficulty dressing or bathing?: No Does the patient have difficulty doing errands alone such as visiting a doctor's office or shopping?: No   Assessment & Plan  1. Type 2 diabetes mellitus with stage 2 chronic kidney disease, without long-term current use of insulin (HCC)  Doing well, continue medication -  POCT HgB A1C - POCT Glucose (CBG) - metFORMIN (GLUCOPHAGE) 500 MG tablet; Take 1 tablet (500 mg total) by mouth daily with breakfast.  Dispense: 90 tablet; Refill: 1  2. Hypertension, benign  At goal  - amLODipine-valsartan (EXFORGE) 5-320 MG tablet; Take 1 tablet by mouth daily.  Dispense: 90 tablet; Refill: 1  3. Sick sinus syndrome (HCC)  Continue follow up with Dr. Clayborn Bigness  4. History of cardiac pacemaker  Since 2006  5. Elevated LFTs  - AST - ALT  6. Elevated TSH  - Thyroid Panel With TSH  7. Dyslipidemia  - Lipid panel

## 2015-04-06 LAB — AST: AST: 33 IU/L (ref 0–40)

## 2015-04-06 LAB — THYROID PANEL WITH TSH
FREE THYROXINE INDEX: 2.8 (ref 1.2–4.9)
T3 Uptake Ratio: 22 % — ABNORMAL LOW (ref 24–39)
T4, Total: 12.8 ug/dL — ABNORMAL HIGH (ref 4.5–12.0)
TSH: 3.65 u[IU]/mL (ref 0.450–4.500)

## 2015-04-06 LAB — ALT: ALT: 32 IU/L (ref 0–32)

## 2015-04-06 LAB — LIPID PANEL
CHOLESTEROL TOTAL: 202 mg/dL — AB (ref 100–199)
Chol/HDL Ratio: 2 ratio units (ref 0.0–4.4)
HDL: 103 mg/dL (ref >39)
LDL CALC: 87 mg/dL (ref 0–99)
TRIGLYCERIDES: 60 mg/dL (ref 0–149)
VLDL CHOLESTEROL CAL: 12 mg/dL (ref 5–40)

## 2015-04-29 ENCOUNTER — Other Ambulatory Visit: Payer: Self-pay

## 2015-04-29 ENCOUNTER — Ambulatory Visit: Payer: Self-pay

## 2015-04-29 NOTE — Patient Outreach (Signed)
[place] [facility]) Care Management  04/29/2015  IMONI CHAMPOUX April 11, 1932 [id number]  Telephone call to patient for monthly call. No answer.  HIPAA compliant voice message left.    Plan: RN Health Coach will attempt patient within 1-2 weeks.  [name], RN, MSN [place] [phone]

## 2015-05-03 ENCOUNTER — Encounter: Payer: Self-pay | Admitting: Family Medicine

## 2015-05-03 ENCOUNTER — Ambulatory Visit (INDEPENDENT_AMBULATORY_CARE_PROVIDER_SITE_OTHER): Payer: Commercial Managed Care - HMO | Admitting: Family Medicine

## 2015-05-03 VITALS — BP 142/76 | HR 78 | Temp 98.0°F | Resp 16 | Ht 62.0 in | Wt 131.3 lb

## 2015-05-03 DIAGNOSIS — R946 Abnormal results of thyroid function studies: Secondary | ICD-10-CM | POA: Diagnosis not present

## 2015-05-03 DIAGNOSIS — M150 Primary generalized (osteo)arthritis: Secondary | ICD-10-CM

## 2015-05-03 DIAGNOSIS — M159 Polyosteoarthritis, unspecified: Secondary | ICD-10-CM

## 2015-05-03 DIAGNOSIS — R635 Abnormal weight gain: Secondary | ICD-10-CM | POA: Insufficient documentation

## 2015-05-03 DIAGNOSIS — Z0000 Encounter for general adult medical examination without abnormal findings: Secondary | ICD-10-CM

## 2015-05-03 DIAGNOSIS — R7989 Other specified abnormal findings of blood chemistry: Secondary | ICD-10-CM

## 2015-05-03 DIAGNOSIS — M1990 Unspecified osteoarthritis, unspecified site: Secondary | ICD-10-CM | POA: Insufficient documentation

## 2015-05-03 DIAGNOSIS — N959 Unspecified menopausal and perimenopausal disorder: Secondary | ICD-10-CM | POA: Insufficient documentation

## 2015-05-03 NOTE — Progress Notes (Signed)
Name: Emma Campbell   MRN: [id number]    DOB: 1932/10/29   Date:05/03/2015       Progress Note  Subjective  Chief Complaint  Chief Complaint  Patient presents with  . Annual Exam    HPI  Functional ability/safety issues: No Issues Hearing issues: Addressed  Activities of daily living: Discussed Home safety issues: No Issues  End Of Life Planning: Offered verbal information regarding advanced directives, healthcare power of attorney. She will contact Hospice and have it done soon.  Preventative care, Health maintenance, Preventative health measures discussed.  Preventative screenings discussed today: lab work, colonoscopy , PAP, mammogram, DEXA. Not interested on further screening.   Low Dose CT Chest recommended if Age 61-80 years, 30 pack-year currently smoking OR have quit w/in 15years.   Lifestyle risk factor issued reviewed: Diet, exercise, weight management, advised patient smoking is not healthy, nutrition/diet.  Preventative health measures discussed (5-10 year plan).  Reviewed and recommended vaccinations: - Pneumovax  - Prevnar  - Annual Influenza - Zostavax - Tdap   Depression screening: Done Fall risk screening: Done Discuss ADLs/IADLs: Done  Current medical providers: See HPI  Other health risk factors identified this visit: No other issues Cognitive impairment issues: None identified  All above discussed with patient. Appropriate education, counseling and referral will be made based upon the above.    Abnormal TSH: last level getting back to normal, no longer gaining weight, denies fatigue.   OA: she has knee osteoarthritis, and also right hip. Pain is aching like and intense of the right hip at times, she states that she needs handicap sticker to use prn only. Currently pain free.   Patient Active Problem List   Diagnosis Date Noted  . Arthritis, degenerative 05/03/2015  . Menopausal and perimenopausal disorder 05/03/2015  . Chronic kidney disease  (CKD), stage III (moderate) 11/30/2014  . High risk medication use 11/30/2014  . Diverticulosis 11/30/2014  . Fibrocystic breast disease 11/30/2014  . Elevated LFTs 11/30/2014  . Systolic ejection murmur [id number]  . Paroxysmal a-fib ([place]) 11/30/2014  . Microalbuminuria 07/31/2014  . Elevated TSH 07/31/2014  . History of cardiac pacemaker 07/31/2014  . Sick sinus syndrome ([place]) 08/13/2013  . Hypercholesteremia 08/13/2013  . Hypertension, benign 08/13/2013  . Diabetes mellitus with renal manifestations, controlled ([place]) 08/13/2013  . Avitaminosis D 04/12/2009    Past Surgical History  Procedure Laterality Date  . Pacemaker insertion  2006    Family History  Problem Relation Age of Onset  . Hypertension Mother   . Stroke Mother   . Diabetes Daughter     Social History   Social History  . Marital Status: Widowed    Spouse Name: N/A  . Number of Children: 1  . Years of Education: 13   Occupational History  . Retired [employer] at [place] Topics  . Smoking status: Never Smoker   . Smokeless tobacco: Never Used  . Alcohol Use: No  . Drug Use: No  . Sexual Activity: No   Other Topics Concern  . Not on file   Social History Narrative     Current outpatient prescriptions:  .  amiodarone (PACERONE) 200 MG tablet, Take 200 mg by mouth daily., Disp: , Rfl:  .  amLODipine-valsartan (EXFORGE) 5-320 MG tablet, Take 1 tablet by mouth daily., Disp: 90 tablet, Rfl: 1 .  aspirin 81 MG tablet, Take 81 mg by mouth daily., Disp: , Rfl:  .  cholecalciferol (VITAMIN D) 1000  UNITS tablet, Take 200 Units by mouth daily., Disp: , Rfl:  .  folic acid (FOLVITE) 1 MG tablet, Take 1 mg by mouth daily., Disp: , Rfl:  .  losartan-hydrochlorothiazide (HYZAAR) 100-12.5 MG tablet, Take by mouth., Disp: , Rfl:  .  metFORMIN (GLUCOPHAGE) 500 MG tablet, Take 1 tablet (500 mg total) by mouth daily with breakfast., Disp: 90 tablet, Rfl: 1 .  rosuvastatin (CRESTOR)  5 MG tablet, Take 1 tablet (5 mg total) by mouth daily., Disp: 90 tablet, Rfl: 4 .  saxagliptin HCl (ONGLYZA) 5 MG TABS tablet, Take by mouth., Disp: , Rfl:   Allergies  Allergen Reactions  . Ace Inhibitors      ROS  Constitutional: Negative for fever or weight change.  Respiratory: Negative for cough and shortness of breath.   Cardiovascular: Negative for chest pain or palpitations.  Gastrointestinal: Negative for abdominal pain, no bowel changes.  Musculoskeletal: Negative for gait problem - except when her hips flares and needs to use her handicap sticker, no  joint swelling.  Skin: Negative for rash.  Neurological: Negative for dizziness or headache.  No other specific complaints in a complete review of systems (except as listed in HPI above).  Objective  Filed Vitals:   05/03/15 1114  BP: 142/76  Pulse: 78  Temp: 98 F (36.7 C)  TempSrc: Oral  Resp: 16  Height: 5\' 2"  (1.575 m)  Weight: 131 lb 4.8 oz (59.557 kg)  SpO2: 98%    Body mass index is 24.01 kg/(m^2).  Physical Exam  Constitutional: Patient appears well-developed and well-nourished. No distress.  HENT: Head: Normocephalic and atraumatic. Ears: wax on left ear canal, no erythema or effusion; Nose: Nose normal. Mouth/Throat: Oropharynx is clear and moist. No oropharyngeal exudate.  Eyes: Conjunctivae and EOM are normal. Pupils are equal, round, and reactive to light. No scleral icterus.  Neck: Normal range of motion. Neck supple. No JVD present. No thyromegaly present.  Cardiovascular: Normal rate, regular rhythm and normal heart sounds.  Systolic ejection murmur 3/6 . Pacemaker on left upper chest wall. No BLE edema. Pulmonary/Chest: Effort normal and breath sounds normal. No respiratory distress. Abdominal: Soft. Bowel sounds are normal, no distension. There is no tenderness. no masses Breast: no lumps or masses, no nipple discharge or rashes FEMALE GENITALIA:  Not done RECTAL: not done Musculoskeletal:  Normal range of motion, no joint effusions. No gross deformities. Mild crepitus with extension of right knee Neurological: he is alert and oriented to person, place, and time. No cranial nerve deficit. Coordination, balance, strength, speech and gait are normal.  Skin: Skin is warm and dry. No rash noted. No erythema.  Psychiatric: Patient has a normal mood and affect. behavior is normal. Judgment and thought content normal.  Recent Results (from the past 2160 hour(s))  POCT HgB A1C     Status: None   Collection Time: 04/05/15 10:27 AM  Result Value Ref Range   Hemoglobin A1C 6.7   POCT Glucose (CBG)     Status: Abnormal   Collection Time: 04/05/15 10:27 AM  Result Value Ref Range   POC Glucose 117 (A) 70 - 99 mg/dl  AST     Status: None   Collection Time: 04/05/15 11:13 AM  Result Value Ref Range   AST 33 0 - 40 IU/L  ALT     Status: None   Collection Time: 04/05/15 11:13 AM  Result Value Ref Range   ALT 32 0 - 32 IU/L  Thyroid Panel With TSH  Status: Abnormal   Collection Time: 04/05/15 11:13 AM  Result Value Ref Range   TSH 3.650 0.450 - 4.500 uIU/mL   T4, Total 12.8 (H) 4.5 - 12.0 ug/dL   T3 Uptake Ratio 22 (L) 24 - 39 %   Free Thyroxine Index 2.8 1.2 - 4.9  Lipid panel     Status: Abnormal   Collection Time: 04/05/15 11:13 AM  Result Value Ref Range   Cholesterol, Total 202 (H) 100 - 199 mg/dL   Triglycerides 60 0 - 149 mg/dL   HDL 103 >39 mg/dL   VLDL Cholesterol Cal 12 5 - 40 mg/dL   LDL Calculated 87 0 - 99 mg/dL   Chol/HDL Ratio 2.0 0.0 - 4.4 ratio units    Comment:                                   T. Chol/HDL Ratio                                             Men  Women                               1/2 Avg.Risk  3.4    3.3                                   Avg.Risk  5.0    4.4                                2X Avg.Risk  9.6    7.1                                3X Avg.Risk 23.4   11.0    Depression screen [facility] 2/9 05/03/2015 04/05/2015 03/04/2015 01/07/2015  11/30/2014  Decreased Interest 0 0 0 0 0  Down, Depressed, Hopeless 0 0 0 0 0  PHQ - 2 Score 0 0 0 0 0     Fall Risk: Fall Risk  05/03/2015 04/05/2015 03/04/2015 01/07/2015 11/30/2014  Falls in the past year? No No No No No    Functional Status Survey: Is the patient deaf or have difficulty hearing?: No Does the patient have difficulty seeing, even when wearing glasses/contacts?: Yes Does the patient have difficulty concentrating, remembering, or making decisions?: No Does the patient have difficulty walking or climbing stairs?: No Does the patient have difficulty dressing or bathing?: No Does the patient have difficulty doing errands alone such as visiting a doctor's office or shopping?: No  Assessment & Plan  1. Medicare annual wellness visit, subsequent  Discussed importance of 150 minutes of physical activity weekly, eat two servings of fish weekly, eat one serving of tree nuts ( cashews, pistachios, pecans, almonds.Marland Kitchen) every other day, eat 6 servings of fruit/vegetables daily and drink plenty of water and avoid sweet beverages.   2. Elevated TSH  Recheck next visit, normalizing and not symptoms  3. Primary osteoarthritis involving multiple joints  Filled handicap forms to use prn during hip pain flares that may last up to 3  weeks

## 2015-05-03 NOTE — Patient Instructions (Signed)
  Ms. Oreilly , Thank you for taking time to come for your Medicare Wellness Visit. I appreciate your ongoing commitment to your health goals. Please review the following plan we discussed and let me know if I can assist you in the future.   These are the goals we discussed:  She will try walking 30 minutes at least 5 times weekly     This is a list of the screening recommended for you and due dates:  Health Maintenance  Topic Date Due  . Eye exam for diabetics  03/06/1942  . Flu Shot  09/28/2015  . Hemoglobin A1C  10/03/2015  . Complete foot exam   11/30/2015  . Tetanus Vaccine  08/12/2019  . DEXA scan (bone density measurement)  Completed  . Shingles Vaccine  Completed  . Pneumonia vaccines  Completed

## 2015-05-06 ENCOUNTER — Other Ambulatory Visit: Payer: Self-pay

## 2015-05-06 NOTE — Patient Outreach (Signed)
[facility]) Care Management  [place]  05/06/2015   Emma Campbell [birth year] [id number]  Subjective: Telephone call to patient for outreach call.  Patient reports she is doing good.  Recent wellness visit with primary doctor that went well.  Patient reports that her sugars have been between 130-140. Patient recent A1c is 6.7 up from last check.  Discussed with patient A1c goals and continuing to maintain low carbohydrate diet. She verbalized understanding.   Objective:   Current Medications:  Current Outpatient Prescriptions  Medication Sig Dispense Refill  . amiodarone (PACERONE) 200 MG tablet Take 200 mg by mouth daily.    Marland Kitchen amLODipine-valsartan (EXFORGE) 5-320 MG tablet Take 1 tablet by mouth daily. 90 tablet 1  . aspirin 81 MG tablet Take 81 mg by mouth daily.    . cholecalciferol (VITAMIN D) 1000 UNITS tablet Take 200 Units by mouth daily.    . folic acid (FOLVITE) 1 MG tablet Take 1 mg by mouth daily.    Marland Kitchen losartan-hydrochlorothiazide (HYZAAR) 100-12.5 MG tablet Take by mouth.    . metFORMIN (GLUCOPHAGE) 500 MG tablet Take 1 tablet (500 mg total) by mouth daily with breakfast. 90 tablet 1  . rosuvastatin (CRESTOR) 5 MG tablet Take 1 tablet (5 mg total) by mouth daily. 90 tablet 4  . saxagliptin HCl (ONGLYZA) 5 MG TABS tablet Take by mouth.     No current facility-administered medications for this visit.    Functional Status:  In your present state of health, do you have any difficulty performing the following activities: 05/03/2015 04/05/2015  Hearing? N N  Vision? Y N  Difficulty concentrating or making decisions? N N  Walking or climbing stairs? N N  Dressing or bathing? N N  Doing errands, shopping? N N  Preparing Food and eating ? - -  Using the Toilet? - -  In the past six months, have you accidently leaked urine? - -  Do you have problems with loss of bowel control? - -  Managing your Medications? - -  Managing your Finances? - -   Housekeeping or managing your Housekeeping? - -    Fall/Depression Screening: PHQ 2/9 Scores 05/06/2015 05/03/2015 04/05/2015 03/04/2015 01/07/2015 11/30/2014 11/12/2014  PHQ - 2 Score 0 0 0 0 0 0 0    Assessment: Patient continues to benefit from health coach outreach for disease management and support.    Plan:   RN Health Coach will send an update to physician.  RN Health Coach will contact patient in May 2017 for every other month outreach and patient agrees to next outreach.    [name], RN, MSN [place] [phone]

## 2015-05-30 ENCOUNTER — Emergency Department
Admission: EM | Admit: 2015-05-30 | Discharge: 2015-05-30 | Disposition: A | Discharge status: Home / Self Care | Payer: No Typology Code available for payment source | Attending: Emergency Medicine | Admitting: Emergency Medicine

## 2015-05-30 ENCOUNTER — Encounter: Payer: Self-pay | Admitting: Emergency Medicine

## 2015-05-30 DIAGNOSIS — Y92410 Unspecified street and highway as the place of occurrence of the external cause: Secondary | ICD-10-CM | POA: Insufficient documentation

## 2015-05-30 DIAGNOSIS — E1122 Type 2 diabetes mellitus with diabetic chronic kidney disease: Secondary | ICD-10-CM | POA: Diagnosis not present

## 2015-05-30 DIAGNOSIS — Y9389 Activity, other specified: Secondary | ICD-10-CM | POA: Diagnosis not present

## 2015-05-30 DIAGNOSIS — N183 Chronic kidney disease, stage 3 (moderate): Secondary | ICD-10-CM | POA: Insufficient documentation

## 2015-05-30 DIAGNOSIS — Z7982 Long term (current) use of aspirin: Secondary | ICD-10-CM | POA: Diagnosis not present

## 2015-05-30 DIAGNOSIS — M1990 Unspecified osteoarthritis, unspecified site: Secondary | ICD-10-CM | POA: Insufficient documentation

## 2015-05-30 DIAGNOSIS — Z79899 Other long term (current) drug therapy: Secondary | ICD-10-CM | POA: Diagnosis not present

## 2015-05-30 DIAGNOSIS — E7800 Pure hypercholesterolemia, unspecified: Secondary | ICD-10-CM | POA: Diagnosis not present

## 2015-05-30 DIAGNOSIS — Y999 Unspecified external cause status: Secondary | ICD-10-CM | POA: Diagnosis not present

## 2015-05-30 DIAGNOSIS — I129 Hypertensive chronic kidney disease with stage 1 through stage 4 chronic kidney disease, or unspecified chronic kidney disease: Secondary | ICD-10-CM | POA: Diagnosis not present

## 2015-05-30 DIAGNOSIS — E785 Hyperlipidemia, unspecified: Secondary | ICD-10-CM | POA: Diagnosis not present

## 2015-05-30 DIAGNOSIS — Z7984 Long term (current) use of oral hypoglycemic drugs: Secondary | ICD-10-CM | POA: Insufficient documentation

## 2015-05-30 DIAGNOSIS — Z888 Allergy status to other drugs, medicaments and biological substances status: Secondary | ICD-10-CM | POA: Insufficient documentation

## 2015-05-30 DIAGNOSIS — S39012A Strain of muscle, fascia and tendon of lower back, initial encounter: Secondary | ICD-10-CM | POA: Insufficient documentation

## 2015-05-30 DIAGNOSIS — Z950 Presence of cardiac pacemaker: Secondary | ICD-10-CM | POA: Diagnosis not present

## 2015-05-30 DIAGNOSIS — S3992XA Unspecified injury of lower back, initial encounter: Secondary | ICD-10-CM | POA: Diagnosis present
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
## 2015-05-30 NOTE — ED Notes (Signed)
NAD noted at time of D/C. Pt denies questions or concerns. Pt ambulatory to the lobby at this time.  
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
## 2015-05-31 ENCOUNTER — Telehealth: Payer: Self-pay

## 2015-05-31 NOTE — Telephone Encounter (Signed)
Please check on patient to see how she is doing    ----- Message -----     From: SYSTEM     Sent: 05/30/2015  4:39 PM      To: Steele Sizer, MD     Per the request of Dr. [name], I tried to contact this patient but there was no answer. A message was left for her to give [place] a call when she got the chance.

## 2015-07-01 ENCOUNTER — Other Ambulatory Visit: Payer: Self-pay
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
## 2015-07-01 NOTE — Patient Outreach (Signed)
[place] [facility] Care Management  07/01/2015  Emma Campbell 1932-04-07 [id number]   Telephone call to patient for every other month outreach.  No answer.  HIPAA compliant voice message left.    Plan: RN Health Coach will contact patient within 1-2 weeks.    [name], RN, MSN [place] [phone]

## 2015-08-03 ENCOUNTER — Encounter: Payer: Self-pay | Admitting: Family Medicine

## 2015-08-03 ENCOUNTER — Ambulatory Visit (INDEPENDENT_AMBULATORY_CARE_PROVIDER_SITE_OTHER): Payer: Commercial Managed Care - HMO | Admitting: Family Medicine

## 2015-08-03 VITALS — BP 158/60 | HR 80 | Temp 97.5°F | Resp 16 | Wt 129.3 lb

## 2015-08-03 DIAGNOSIS — Z8679 Personal history of other diseases of the circulatory system: Secondary | ICD-10-CM

## 2015-08-03 DIAGNOSIS — R809 Proteinuria, unspecified: Secondary | ICD-10-CM | POA: Diagnosis not present

## 2015-08-03 DIAGNOSIS — E1122 Type 2 diabetes mellitus with diabetic chronic kidney disease: Secondary | ICD-10-CM | POA: Diagnosis not present

## 2015-08-03 DIAGNOSIS — Z950 Presence of cardiac pacemaker: Secondary | ICD-10-CM

## 2015-08-03 DIAGNOSIS — E1129 Type 2 diabetes mellitus with other diabetic kidney complication: Secondary | ICD-10-CM

## 2015-08-03 DIAGNOSIS — E785 Hyperlipidemia, unspecified: Secondary | ICD-10-CM

## 2015-08-03 DIAGNOSIS — I10 Essential (primary) hypertension: Secondary | ICD-10-CM

## 2015-08-03 DIAGNOSIS — I495 Sick sinus syndrome: Secondary | ICD-10-CM

## 2015-08-03 DIAGNOSIS — N182 Chronic kidney disease, stage 2 (mild): Secondary | ICD-10-CM

## 2015-08-03 DIAGNOSIS — R946 Abnormal results of thyroid function studies: Secondary | ICD-10-CM

## 2015-08-03 DIAGNOSIS — R7989 Other specified abnormal findings of blood chemistry: Secondary | ICD-10-CM

## 2015-08-03 LAB — POCT UA - MICROALBUMIN: MICROALBUMIN (UR) POC: 50 mg/L

## 2015-08-03 LAB — POCT GLYCOSYLATED HEMOGLOBIN (HGB A1C): Hemoglobin A1C: 6.3

## 2015-08-03 MED ORDER — AMLODIPINE BESYLATE-VALSARTAN 5-320 MG PO TABS: 1.0000 | ORAL_TABLET | Freq: Every day | ORAL | Status: DC

## 2015-08-03 MED ORDER — METFORMIN HCL 500 MG PO TABS: 500.0000 mg | ORAL_TABLET | Freq: Two times a day (BID) | ORAL | Status: DC

## 2015-08-03 MED ORDER — HYDROCHLOROTHIAZIDE 25 MG PO TABS: 25.0000 mg | ORAL_TABLET | Freq: Every day | ORAL | Status: DC

## 2015-08-03 MED ORDER — ROSUVASTATIN CALCIUM 5 MG PO TABS: 5.0000 mg | ORAL_TABLET | Freq: Every day | ORAL | Status: DC

## 2015-08-03 NOTE — Progress Notes (Signed)
Name: Emma Campbell   MRN: [id number]    DOB: 10/18/1932   Date:08/03/2015       Progress Note  Subjective  Chief Complaint  Chief Complaint  Patient presents with  . Diabetes    patient checks her blood sugar about 1-2/ week. highest: 120's & lowest: 113.  Marland Kitchen Hypertension    patient stated she has no had any neg sx  . Dyslipidemia    HPI  Elevated TSH: TSH elevated in Feb and again in 06 of 2016, but normal TSH in October, but abnormal fractions. Discussed with Dr. Clayborn Bigness and advised to monitor for now, he will adjust dose of Amiodarone if needed. She takes it for sick sinus syndrome.   DMII with renal manifestation: CKI and microalbuminuria. HgbA1C is great today, she denies hypoglycemia episodes. She feels well. Glucose at home is getting checked a couple of times weekly and is running around 120's. She is complaint with her diet. She denies polyphagia, polydipsia or polyuria. She is due for an eye exam and will schedule it herself - she does not want me to schedule a referral. Continue ARB for kidney protection.  HTN: taking medication and denies side effects of medication, bp at home is around 130's, she states she had some fried chicken and usually makes her bp go up. No orthostatic changes , no chest pain or SOB.  Hyperlipidemia: taking Crestor 5 mg and denies myalgias. Labs done in 03/2015 were at goal and reviewed with patient  Sick Sinus Syndrome: she had a pace maker placed in 2006 and is taking Amiodarone since 2006, last TSH was elevated, no chest pain or palpitation, no syncope. Sees Dr. Clayborn Bigness    Patient Active Problem List   Diagnosis Date Noted  . Type 2 diabetes mellitus with stage 2 chronic kidney disease, without long-term current use of insulin ([place]) 08/03/2015  . Arthritis, degenerative 05/03/2015  . Menopausal and perimenopausal disorder 05/03/2015  . Chronic kidney disease (CKD), stage III (moderate) 11/30/2014  . High risk medication use 11/30/2014  .  Diverticulosis 11/30/2014  . Fibrocystic breast disease 11/30/2014  . Elevated LFTs 11/30/2014  . Systolic ejection murmur [id number]  . Paroxysmal a-fib ([place]) 11/30/2014  . Microalbuminuria 07/31/2014  . Elevated TSH 07/31/2014  . History of cardiac pacemaker 07/31/2014  . Sick sinus syndrome ([place]) 08/13/2013  . Hypercholesteremia 08/13/2013  . Hypertension, benign 08/13/2013  . Diabetes mellitus with renal manifestations, controlled ([place]) 08/13/2013  . Avitaminosis D 04/12/2009    Past Surgical History  Procedure Laterality Date  . Pacemaker insertion  2006    Family History  Problem Relation Age of Onset  . Hypertension Mother   . Stroke Mother   . Diabetes Daughter     Social History   Social History  . Marital Status: Widowed    Spouse Name: N/A  . Number of Children: 1  . Years of Education: 13   Occupational History  . Retired [employer] at [place] Topics  . Smoking status: Never Smoker   . Smokeless tobacco: Never Used  . Alcohol Use: No  . Drug Use: No  . Sexual Activity: No   Other Topics Concern  . Not on file   Social History Narrative     Current outpatient prescriptions:  .  amiodarone (PACERONE) 200 MG tablet, Take 200 mg by mouth daily., Disp: , Rfl:  .  amLODipine-valsartan (EXFORGE) 5-320 MG tablet, Take 1 tablet by mouth daily., Disp:  90 tablet, Rfl: 0 .  aspirin 81 MG tablet, Take 81 mg by mouth daily., Disp: , Rfl:  .  cholecalciferol (VITAMIN D) 1000 UNITS tablet, Take 200 Units by mouth daily., Disp: , Rfl:  .  folic acid (FOLVITE) 1 MG tablet, Take 1 mg by mouth daily., Disp: , Rfl:  .  hydrochlorothiazide (HYDRODIURIL) 25 MG tablet, Take 1 tablet (25 mg total) by mouth daily., Disp: 30 tablet, Rfl: 0 .  metFORMIN (GLUCOPHAGE) 500 MG tablet, Take 1 tablet (500 mg total) by mouth 2 (two) times daily with a meal., Disp: 180 tablet, Rfl: 1 .  rosuvastatin (CRESTOR) 5 MG tablet, Take 1 tablet (5 mg total)  by mouth daily., Disp: 90 tablet, Rfl: 4  Allergies  Allergen Reactions  . Ace Inhibitors      ROS  Constitutional: Negative for fever or weight change.  Respiratory: Negative for cough and shortness of breath.   Cardiovascular: Negative for chest pain or palpitations.  Gastrointestinal: Negative for abdominal pain, no bowel changes.  Musculoskeletal: Negative for gait problem or joint swelling.  Skin: Negative for rash.  Neurological: Negative for dizziness or headache.  No other specific complaints in a complete review of systems (except as listed in HPI above).  Objective  Filed Vitals:   08/03/15 1103  BP: 158/60  Pulse: 80  Temp: 97.5 F (36.4 C)  TempSrc: Oral  Resp: 16  Weight: 129 lb 4.8 oz (58.65 kg)  SpO2: 96%    Body mass index is 22.91 kg/(m^2).  Physical Exam  Constitutional: Patient appears well-developed and well-nourished. No distress.  HEENT: head atraumatic, normocephalic, pupils equal and reactive to light,neck supple, throat within normal limits Cardiovascular: Normal rate, regular rhythm with holosystolic systolic ejection murmur No BLE edema. Pacemaker on Left chest wall, no -tenderness Pulmonary/Chest: Effort normal and breath sounds normal. No respiratory distress. Abdominal: Soft. There is no tenderness. Psychiatric: Patient has a normal mood and affect. behavior is normal. Judgment and thought content normal.  Recent Results (from the past 2160 hour(s))  POCT glycosylated hemoglobin (Hb A1C)     Status: Abnormal   Collection Time: 08/03/15 11:07 AM  Result Value Ref Range   Hemoglobin A1C 6.3   POCT UA - Microalbumin     Status: Abnormal   Collection Time: 08/03/15 11:07 AM  Result Value Ref Range   Microalbumin Ur, POC 50 mg/L   Creatinine, POC  mg/dL   Albumin/Creatinine Ratio, Urine, POC       PHQ2/9: Depression screen [facility] 2/9 08/03/2015 07/01/2015 05/06/2015 05/03/2015 04/05/2015  Decreased Interest 0 0 0 0 0  Down, Depressed, Hopeless  0 0 0 0 0  PHQ - 2 Score 0 0 0 0 0     Fall Risk: Fall Risk  08/03/2015 07/01/2015 05/06/2015 05/03/2015 04/05/2015  Falls in the past year? No No No No No      Functional Status Survey: Is the patient deaf or have difficulty hearing?: No Does the patient have difficulty seeing, even when wearing glasses/contacts?: No Does the patient have difficulty concentrating, remembering, or making decisions?: No Does the patient have difficulty walking or climbing stairs?: No Does the patient have difficulty dressing or bathing?: No Does the patient have difficulty doing errands alone such as visiting a doctor's office or shopping?: No    Assessment & Plan  1. Type 2 diabetes mellitus with microalbuminuria, without long-term current use of insulin (HCC)  - POCT glycosylated hemoglobin (Hb A1C) - POCT UA - Microalbumin - metFORMIN (  GLUCOPHAGE) 500 MG tablet; Take 1 tablet (500 mg total) by mouth 2 (two) times daily with a meal.  Dispense: 180 tablet; Refill: 1  2. Dyslipidemia  - rosuvastatin (CRESTOR) 5 MG tablet; Take 1 tablet (5 mg total) by mouth daily.  Dispense: 90 tablet; Refill: 4  3. Type 2 diabetes mellitus with stage 2 chronic kidney disease, without long-term current use of insulin (HCC)  Well controlled with Metformin and Onglyza, hgbA1C is lower, we will stop Onglyza and increase Metformin to twice daily   4. Elevated TSH  Normal TSH x 2 and   5. Sick sinus syndrome ([place])  Doing well since started on Amiodarone, no longer has syncope  6. Hypertension, benign  - amLODipine-valsartan (EXFORGE) 5-320 MG tablet; Take 1 tablet by mouth daily.  Dispense: 90 tablet; Refill: 0 - hydrochlorothiazide (HYDRODIURIL) 25 MG tablet; Take 1 tablet (25 mg total) by mouth daily.  Dispense: 30 tablet; Refill: 0 bp is staying elevated we will add hctz  7. History of cardiac pacemaker  Continue follow up with Dr. Clayborn Bigness every 4 months  8. Microalbuminuria  Stable, continue ARB

## 2015-08-11 ENCOUNTER — Other Ambulatory Visit: Payer: Self-pay | Admitting: Family Medicine

## 2015-08-11 NOTE — Telephone Encounter (Signed)
Patient requesting refill. 

## 2015-08-17 DIAGNOSIS — M1990 Unspecified osteoarthritis, unspecified site: Secondary | ICD-10-CM | POA: Diagnosis not present

## 2015-08-17 DIAGNOSIS — E119 Type 2 diabetes mellitus without complications: Secondary | ICD-10-CM | POA: Diagnosis not present

## 2015-08-17 DIAGNOSIS — I480 Paroxysmal atrial fibrillation: Secondary | ICD-10-CM | POA: Diagnosis not present

## 2015-08-17 DIAGNOSIS — R001 Bradycardia, unspecified: Secondary | ICD-10-CM | POA: Diagnosis not present

## 2015-08-17 DIAGNOSIS — E785 Hyperlipidemia, unspecified: Secondary | ICD-10-CM | POA: Diagnosis not present

## 2015-08-17 DIAGNOSIS — I495 Sick sinus syndrome: Secondary | ICD-10-CM | POA: Diagnosis not present

## 2015-08-17 DIAGNOSIS — I10 Essential (primary) hypertension: Secondary | ICD-10-CM | POA: Diagnosis not present

## 2015-08-17 DIAGNOSIS — R011 Cardiac murmur, unspecified: Secondary | ICD-10-CM | POA: Diagnosis not present

## 2015-08-25 ENCOUNTER — Ambulatory Visit: Payer: Commercial Managed Care - HMO

## 2015-08-30 ENCOUNTER — Ambulatory Visit: Payer: Commercial Managed Care - HMO

## 2015-09-06 ENCOUNTER — Other Ambulatory Visit: Payer: Self-pay

## 2015-09-06 NOTE — Patient Outreach (Signed)
[facility]) Care Management  09/06/2015  JASHIRA GAROFOLO [birth year] [id number]  Telephone call to patient for every other month call.  No answer. Unable to leave a message.  Plan: RN Health Coach will attempt patient again in the month of July.    [name], RN, MSN [place] [phone]

## 2015-09-07 ENCOUNTER — Other Ambulatory Visit: Payer: Self-pay | Admitting: Family Medicine

## 2015-09-07 NOTE — Telephone Encounter (Signed)
Patient requesting refill. 

## 2015-09-22 ENCOUNTER — Other Ambulatory Visit: Payer: Self-pay

## 2015-09-22 NOTE — Patient Outreach (Signed)
[place] [facility]) Care Management  [place]  09/22/2015   Emma Campbell 28-Jun-1932 [id number]  Subjective: Telephone call to patient for every other month call. Patient reports she is doing ok. Patient reports seeing primary doctor last month and changes with medications. Patient reports hydrochlorothiazide was added and metformin was increased.  Patient last A1c was 6.3 some lower than previous. However, patient reports she is not checking her sugars. Discussed with patient the importance of checking blood sugar and reason for checking sugar.   Also discussed with patient the importance of limiting carbohydrates and sweets in her diet. She verbalized understanding and states she just got out of the habit of checking her sugars but will start back.     Objective:   Encounter Medications:  Outpatient Encounter Prescriptions as of 09/22/2015  Medication Sig Note  . amiodarone (PACERONE) 200 MG tablet Take 200 mg by mouth daily. 09/22/2015: Taking half tablet  . amLODipine-valsartan (EXFORGE) 5-320 MG tablet TAKE 1 TABLET EVERY DAY   . aspirin 81 MG tablet Take 81 mg by mouth daily.   . cholecalciferol (VITAMIN D) 1000 UNITS tablet Take 200 Units by mouth daily.   . folic acid (FOLVITE) 1 MG tablet Take 1 mg by mouth daily.   . hydrochlorothiazide (HYDRODIURIL) 25 MG tablet TAKE 1 TABLET (25 MG TOTAL) BY MOUTH DAILY.   . metFORMIN (GLUCOPHAGE) 500 MG tablet Take 1 tablet (500 mg total) by mouth 2 (two) times daily with a meal.   . rosuvastatin (CRESTOR) 5 MG tablet Take 1 tablet (5 mg total) by mouth daily.    No facility-administered encounter medications on file as of 09/22/2015.     Functional Status:  In your present state of health, do you have any difficulty performing the following activities: 08/03/2015 05/03/2015  Hearing? N N  Vision? N Y  Difficulty concentrating or making decisions? N N  Walking or climbing stairs? N N  Dressing or bathing? N N  Doing errands,  shopping? N N  Preparing Food and eating ? - -  Using the Toilet? - -  In the past six months, have you accidently leaked urine? - -  Do you have problems with loss of bowel control? - -  Managing your Medications? - -  Managing your Finances? - -  Housekeeping or managing your Housekeeping? - -  Some recent data might be hidden    Fall/Depression Screening: PHQ 2/9 Scores 09/22/2015 08/03/2015 07/01/2015 05/06/2015 05/03/2015 04/05/2015 03/04/2015  PHQ - 2 Score 0 0 0 0 0 0 0    Assessment: Patient continues to benefit from health coach outreach for disease management and support.   Plan:  [facility] CM Care Plan Problem One   Flowsheet Row Most Recent Value  Care Plan Problem One  Knowledge Deficit related to Diabetes  Role Documenting the Problem One  [place] for Problem One  Active  THN Long Term Goal (31-90 days)  Patient will decrease A1c by 0.2 points within 90 days.    THN Long Term Goal Start Date  09/22/15 [goal continued]  Interventions for Problem One Long Term Goal  RN Health Coach discussed with patient A1c goals and maintain low carbohydrate diet.    THN CM Short Term Goal #1 (0-30 days)  Patient will report checking blood sugar within 30 days.  THN CM Short Term Goal #1 Start Date  09/22/15  Interventions for Short Term Goal #1  RN Health Coach discussed with patient  the importance of checking her blood sugars.       RN Health Coach will contact patient in the month of September and patient agrees to next outreach.  [name], RN, MSN [place] [phone]

## 2015-10-13 ENCOUNTER — Other Ambulatory Visit: Payer: Self-pay

## 2015-10-13 MED ORDER — HYDROCHLOROTHIAZIDE 25 MG PO TABS: 25.0000 mg | ORAL_TABLET | Freq: Every day | ORAL | 0 refills | Status: DC

## 2015-10-13 NOTE — Telephone Encounter (Signed)
Last prescription was only for a 30 day supply, but patient uses [facility]. Can you please sent in a 90 day supply of Hydrochlorothiazide. Thanks

## 2015-10-20 ENCOUNTER — Telehealth: Payer: Self-pay | Admitting: Family Medicine

## 2015-10-20 ENCOUNTER — Other Ambulatory Visit: Payer: Self-pay | Admitting: Family Medicine

## 2015-10-20 NOTE — Telephone Encounter (Signed)
I sent HCTZ 25 mg to mail order this month

## 2015-11-16 ENCOUNTER — Other Ambulatory Visit: Payer: Self-pay

## 2015-11-16 NOTE — Patient Outreach (Signed)
[place] [facility]) Care Management  11/16/2015  Emma Campbell [birth year] [id number]   Telephone call to patient for every other month call. No answer. HIPAA compliant voice message left.   Plan: RN Health Coach will attempt patient again in the month of September.  [name], RN, MSN [place] [phone]

## 2015-11-26 ENCOUNTER — Other Ambulatory Visit: Payer: Self-pay

## 2015-11-26 NOTE — Patient Outreach (Signed)
[facility]) Care Management  [place]  11/26/2015   ARANZA EMILY [birth year] [id number]  Subjective: Telephone call to patient for every other month call. Patient reports she is doing well. She continues to exercise and watch her diet.  Patient reports upcoming primary care appointment next week. Patient reports that her sugars are in the 120-130 range when she checks.  Reiterated to patient the importance of continuing diet and exercise to control sugars. She verbalized understanding.  No concerns.   Objective:   Encounter Medications:  Outpatient Encounter Prescriptions as of 11/26/2015  Medication Sig Note  . amiodarone (PACERONE) 200 MG tablet Take 200 mg by mouth daily. 09/22/2015: Taking half tablet  . amLODipine-valsartan (EXFORGE) 5-320 MG tablet TAKE 1 TABLET EVERY DAY   . aspirin 81 MG tablet Take 81 mg by mouth daily.   . cholecalciferol (VITAMIN D) 1000 UNITS tablet Take 200 Units by mouth daily.   . folic acid (FOLVITE) 1 MG tablet Take 1 mg by mouth daily.   . hydrochlorothiazide (HYDRODIURIL) 25 MG tablet Take 1 tablet (25 mg total) by mouth daily.   . metFORMIN (GLUCOPHAGE) 500 MG tablet Take 1 tablet (500 mg total) by mouth 2 (two) times daily with a meal.   . rosuvastatin (CRESTOR) 5 MG tablet Take 1 tablet (5 mg total) by mouth daily.    No facility-administered encounter medications on file as of 11/26/2015.     Functional Status:  In your present state of health, do you have any difficulty performing the following activities: 08/03/2015 05/03/2015  Hearing? N N  Vision? N Y  Difficulty concentrating or making decisions? N N  Walking or climbing stairs? N N  Dressing or bathing? N N  Doing errands, shopping? N N  Preparing Food and eating ? - -  Using the Toilet? - -  In the past six months, have you accidently leaked urine? - -  Do you have problems with loss of bowel control? - -  Managing your Medications? - -  Managing your Finances?  - -  Housekeeping or managing your Housekeeping? - -  Some recent data might be hidden    Fall/Depression Screening: PHQ 2/9 Scores 11/26/2015 09/22/2015 08/03/2015 07/01/2015 05/06/2015 05/03/2015 04/05/2015  PHQ - 2 Score 0 0 0 0 0 0 0    Assessment: Patient continues to benefit from health coach outreach for disease management and support.    Plan:  [facility] CM Care Plan Problem One   Flowsheet Row Most Recent Value  Care Plan Problem One  Knowledge Deficit related to Diabetes  Role Documenting the Problem One  [place] for Problem One  Active  THN Long Term Goal (31-90 days)  Patient will decrease A1c by 0.2 points within 90 days.    THN Long Term Goal Start Date  11/26/15 [goal continued]  Interventions for Problem One Long Term Goal  RN Health Coach reiterated with patient A1c goals and maintain low carbohydrate diet.    THN CM Short Term Goal #1 (0-30 days)  Patient will report checking blood sugar within 30 days.  THN CM Short Term Goal #1 Start Date  11/26/15  Interventions for Short Term Goal #1  RN Health Coach reiterated with patient the importance of checking her blood sugars.       RN Health Coach will contact patient in the month of November and patient agrees to next outreach.  [name], RN, MSN [facility] Care Management RN  [place]

## 2015-12-03 ENCOUNTER — Ambulatory Visit: Payer: Commercial Managed Care - HMO | Admitting: Family Medicine

## 2015-12-09 ENCOUNTER — Encounter: Payer: Self-pay | Admitting: Family Medicine

## 2015-12-09 ENCOUNTER — Ambulatory Visit (INDEPENDENT_AMBULATORY_CARE_PROVIDER_SITE_OTHER): Payer: Commercial Managed Care - HMO | Admitting: Family Medicine

## 2015-12-09 VITALS — BP 132/68 | HR 85 | Temp 98.2°F | Resp 16 | Ht 63.0 in | Wt 131.1 lb

## 2015-12-09 DIAGNOSIS — I10 Essential (primary) hypertension: Secondary | ICD-10-CM | POA: Diagnosis not present

## 2015-12-09 DIAGNOSIS — N182 Chronic kidney disease, stage 2 (mild): Secondary | ICD-10-CM | POA: Diagnosis not present

## 2015-12-09 DIAGNOSIS — I495 Sick sinus syndrome: Secondary | ICD-10-CM

## 2015-12-09 DIAGNOSIS — Z79899 Other long term (current) drug therapy: Secondary | ICD-10-CM | POA: Diagnosis not present

## 2015-12-09 DIAGNOSIS — I480 Paroxysmal atrial fibrillation: Secondary | ICD-10-CM | POA: Diagnosis not present

## 2015-12-09 DIAGNOSIS — R7989 Other specified abnormal findings of blood chemistry: Secondary | ICD-10-CM

## 2015-12-09 DIAGNOSIS — R809 Proteinuria, unspecified: Secondary | ICD-10-CM

## 2015-12-09 DIAGNOSIS — E559 Vitamin D deficiency, unspecified: Secondary | ICD-10-CM | POA: Diagnosis not present

## 2015-12-09 DIAGNOSIS — R946 Abnormal results of thyroid function studies: Secondary | ICD-10-CM | POA: Diagnosis not present

## 2015-12-09 DIAGNOSIS — E1122 Type 2 diabetes mellitus with diabetic chronic kidney disease: Secondary | ICD-10-CM | POA: Diagnosis not present

## 2015-12-09 DIAGNOSIS — E785 Hyperlipidemia, unspecified: Secondary | ICD-10-CM

## 2015-12-09 DIAGNOSIS — Z23 Encounter for immunization: Secondary | ICD-10-CM | POA: Diagnosis not present

## 2015-12-09 DIAGNOSIS — Z950 Presence of cardiac pacemaker: Secondary | ICD-10-CM | POA: Diagnosis not present

## 2015-12-09 LAB — CBC WITH DIFFERENTIAL/PLATELET
BASOS PCT: 1 %
Basophils Absolute: 41 cells/uL (ref 0–200)
Eosinophils Absolute: 205 cells/uL (ref 15–500)
Eosinophils Relative: 5 %
HCT: 34.1 % — ABNORMAL LOW (ref 35.0–45.0)
HEMOGLOBIN: 11.1 g/dL — AB (ref 11.7–15.5)
LYMPHS ABS: 1722 {cells}/uL (ref 850–3900)
Lymphocytes Relative: 42 %
MCH: 29.2 pg (ref 27.0–33.0)
MCHC: 32.6 g/dL (ref 32.0–36.0)
MCV: 89.7 fL (ref 80.0–100.0)
MONOS PCT: 8 %
MPV: 9.8 fL (ref 7.5–12.5)
Monocytes Absolute: 328 cells/uL (ref 200–950)
NEUTROS ABS: 1804 {cells}/uL (ref 1500–7800)
Neutrophils Relative %: 44 %
PLATELETS: 205 10*3/uL (ref 140–400)
RBC: 3.8 MIL/uL (ref 3.80–5.10)
RDW: 13.6 % (ref 11.0–15.0)
WBC: 4.1 10*3/uL (ref 3.8–10.8)

## 2015-12-09 LAB — POCT GLYCOSYLATED HEMOGLOBIN (HGB A1C): Hemoglobin A1C: 6.7

## 2015-12-09 LAB — COMPLETE METABOLIC PANEL WITH GFR
ALBUMIN: 4.1 g/dL (ref 3.6–5.1)
ALK PHOS: 59 U/L (ref 33–130)
ALT: 21 U/L (ref 6–29)
AST: 28 U/L (ref 10–35)
BILIRUBIN TOTAL: 0.5 mg/dL (ref 0.2–1.2)
BUN: 15 mg/dL (ref 7–25)
CO2: 24 mmol/L (ref 20–31)
CREATININE: 0.85 mg/dL (ref 0.60–0.88)
Calcium: 9.1 mg/dL (ref 8.6–10.4)
Chloride: 102 mmol/L (ref 98–110)
GFR, EST AFRICAN AMERICAN: 74 mL/min (ref >=60)
GFR, Est Non African American: 64 mL/min (ref >=60)
GLUCOSE: 120 mg/dL — AB (ref 65–99)
Potassium: 3.8 mmol/L (ref 3.5–5.3)
SODIUM: 140 mmol/L (ref 135–146)
TOTAL PROTEIN: 6.8 g/dL (ref 6.1–8.1)

## 2015-12-09 LAB — LIPID PANEL
CHOLESTEROL: 213 mg/dL — AB (ref 125–200)
HDL: 120 mg/dL (ref >=46)
LDL Cholesterol: 82 mg/dL (ref <130)
TRIGLYCERIDES: 54 mg/dL (ref <150)
Total CHOL/HDL Ratio: 1.8 Ratio (ref <=5.0)
VLDL: 11 mg/dL (ref <30)

## 2015-12-09 LAB — THYROID PANEL WITH TSH
FREE THYROXINE INDEX: 3 (ref 1.4–3.8)
T3 UPTAKE: 25 % (ref 22–35)
T4 TOTAL: 12 ug/dL (ref 4.5–12.0)
TSH: 3.39 m[IU]/L

## 2015-12-09 MED ORDER — HYDROCHLOROTHIAZIDE 25 MG PO TABS: 25.0000 mg | ORAL_TABLET | Freq: Every day | ORAL | 1 refills | Status: DC

## 2015-12-09 MED ORDER — METFORMIN HCL ER 750 MG PO TB24: 750.0000 mg | ORAL_TABLET | Freq: Every evening | ORAL | 1 refills | Status: DC

## 2015-12-09 MED ORDER — AMLODIPINE BESYLATE-VALSARTAN 5-320 MG PO TABS: 1.0000 | ORAL_TABLET | Freq: Every day | ORAL | 1 refills | Status: DC

## 2015-12-09 NOTE — Progress Notes (Signed)
Name: Emma Campbell   MRN: [id number]    DOB: 11-26-1932   Date:12/09/2015       Progress Note  Subjective  Chief Complaint  Chief Complaint  Patient presents with  . Diabetes     avg 120-130  . Hypertension  . Hyperlipidemia  . Flu Vaccine    HPI  Elevated TSH: TSH elevated in Feb and again in 06 of 2016, but normal TSH in October, but abnormal fractions. Discussed with Dr. Clayborn Bigness and advised to monitor for now, she is now on a lower dose of Amiodarone, we will recheck labs. She takes it for sick sinus syndrome.   DMII with renal manifestation: CKI and microalbuminuria. HgbA1C has been normal, she denies hypoglycemia episodes. She feels well. Glucose at home is getting checked a couple of times weekly and is running around 120's. She is complaint with her diet. She denies polyphagia, polydipsia or polyuria. She is due for an eye exam and will schedule it herself - she does not want me to schedule a referral. Continue ARB for kidney protection. She states that Metformin twice daily has been causing diarrhea and occasional abdominal cramping so she has taking it mostly once a day. We will try changing to ER  HTN: taking medication and denies side effects of medication, bp at home is around 130's,. No orthostatic changes , no chest pain or SOB.  Hyperlipidemia: taking Crestor 5 mg and denies myalgias. Labs done in 03/2015 were at goal and reviewed with patient, we will recheck labs done  Sick Sinus Syndrome: she had a pace maker placed in 2006 and is taking Amiodarone since 2006, last TSH was elevated, no chest pain or palpitation, no syncope. Sees Dr. Clayborn Bigness  . She is now on lower dose of Amiodarone and still no symptoms.   Vitamin D deficiency: still taking supplementation, denies fatigue  Patient Active Problem List   Diagnosis Date Noted  . Type 2 diabetes mellitus with stage 2 chronic kidney disease, without long-term current use of insulin ([place]) 08/03/2015  . Arthritis,  degenerative 05/03/2015  . Menopausal and perimenopausal disorder 05/03/2015  . Chronic kidney disease (CKD), stage III (moderate) 11/30/2014  . High risk medication use 11/30/2014  . Diverticulosis 11/30/2014  . Fibrocystic breast disease 11/30/2014  . Elevated LFTs 11/30/2014  . Systolic ejection murmur [id number]  . Paroxysmal a-fib ([place]) 11/30/2014  . Microalbuminuria 07/31/2014  . Elevated TSH 07/31/2014  . History of cardiac pacemaker 07/31/2014  . Sick sinus syndrome ([place]) 08/13/2013  . Hypercholesteremia 08/13/2013  . Hypertension, benign 08/13/2013  . Diabetes mellitus with renal manifestations, controlled ([place]) 08/13/2013  . Avitaminosis D 04/12/2009    Past Surgical History:  Procedure Laterality Date  . PACEMAKER INSERTION  2006    Family History  Problem Relation Age of Onset  . Hypertension Mother   . Stroke Mother   . Diabetes Daughter     Social History   Social History  . Marital status: Widowed    Spouse name: N/A  . Number of children: 1  . Years of education: 22   Occupational History  . Retired [employer] at [place] Topics  . Smoking status: Never Smoker  . Smokeless tobacco: Never Used  . Alcohol use No  . Drug use: No  . Sexual activity: No   Other Topics Concern  . Not on file   Social History Narrative  . No narrative on file     Current  Outpatient Prescriptions:  .  amiodarone (PACERONE) 200 MG tablet, Take 100 mg by mouth daily., Disp: , Rfl:  .  amLODipine-valsartan (EXFORGE) 5-320 MG tablet, Take 1 tablet by mouth daily., Disp: 90 tablet, Rfl: 1 .  aspirin 81 MG tablet, Take 81 mg by mouth daily., Disp: , Rfl:  .  cholecalciferol (VITAMIN D) 1000 UNITS tablet, Take 200 Units by mouth daily., Disp: , Rfl:  .  folic acid (FOLVITE) 1 MG tablet, Take 1 mg by mouth daily., Disp: , Rfl:  .  hydrochlorothiazide (HYDRODIURIL) 25 MG tablet, Take 1 tablet (25 mg total) by mouth daily., Disp: 90 tablet,  Rfl: 1 .  metFORMIN (GLUCOPHAGE-XR) 750 MG 24 hr tablet, Take 1 tablet (750 mg total) by mouth every evening., Disp: 90 tablet, Rfl: 1 .  rosuvastatin (CRESTOR) 5 MG tablet, Take 1 tablet (5 mg total) by mouth daily., Disp: 90 tablet, Rfl: 4  Allergies  Allergen Reactions  . Ace Inhibitors      ROS  Constitutional: Negative for fever or weight change.  Respiratory: Negative for cough and shortness of breath.   Cardiovascular: Negative for chest pain or palpitations.  Gastrointestinal: Negative for abdominal pain, no bowel changes.  Musculoskeletal: Negative for gait problem or joint swelling.  Skin: Negative for rash.  Neurological: Negative for dizziness or headache.  No other specific complaints in a complete review of systems (except as listed in HPI above).  Objective  Vitals:   12/09/15 1037  BP: 132/68  Pulse: 85  Resp: 16  Temp: 98.2 F (36.8 C)  SpO2: 98%  Weight: 131 lb 2 oz (59.5 kg)  Height: 5\' 3"  (1.6 m)    Body mass index is 23.23 kg/m.  Physical Exam  Constitutional: Patient appears well-developed and well-nourished. No distress.  HEENT: head atraumatic, normocephalic, pupils equal and reactive to light, neck supple, throat within normal limits Cardiovascular: Normal rate, regular rhythm, systolic ejection murmur 3/6 .  No murmur heard. No BLE edema. Pulmonary/Chest: Effort normal and breath sounds normal. No respiratory distress. Abdominal: Soft.  There is no tenderness. Psychiatric: Patient has a normal mood and affect. behavior is normal. Judgment and thought content normal.  Recent Results (from the past 2160 hour(s))  POCT HgB A1C     Status: None   Collection Time: 12/09/15 10:51 AM  Result Value Ref Range   Hemoglobin A1C 6.7     Diabetic Foot Exam: Diabetic Foot Exam - Simple   Simple Foot Form Diabetic Foot exam was performed with the following findings:  Yes 12/09/2015 10:59 AM  Visual Inspection See comments:  Yes Sensation  Testing Intact to touch and monofilament testing bilaterally:  Yes Pulse Check Posterior Tibialis and Dorsalis pulse intact bilaterally:  Yes Comments Thick toenails      PHQ2/9: Depression screen [facility] 2/9 12/09/2015 11/26/2015 09/22/2015 08/03/2015 07/01/2015  Decreased Interest 0 0 0 0 0  Down, Depressed, Hopeless 0 0 0 0 0  PHQ - 2 Score 0 0 0 0 0     Fall Risk: Fall Risk  12/09/2015 11/26/2015 09/22/2015 08/03/2015 07/01/2015  Falls in the past year? No No No No No     Functional Status Survey: Is the patient deaf or have difficulty hearing?: No Does the patient have difficulty seeing, even when wearing glasses/contacts?: No Does the patient have difficulty concentrating, remembering, or making decisions?: No Does the patient have difficulty walking or climbing stairs?: No Does the patient have difficulty dressing or bathing?: No Does the patient have  difficulty doing errands alone such as visiting a doctor's office or shopping?: No    Assessment & Plan  1. Type 2 diabetes mellitus with stage 2 chronic kidney disease, without long-term current use of insulin (HCC)  hgbA1C has gone up, likely from taking metformin once a day to avoid side effects, change to ER and monitor , still at goal, needs to have eye exam done - POCT HgB A1C - metFORMIN (GLUCOPHAGE-XR) 750 MG 24 hr tablet; Take 1 tablet (750 mg total) by mouth every evening.  Dispense: 90 tablet; Refill: 1  2. Need for influenza vaccination  - Flu vaccine HIGH DOSE PF (Fluzone High dose)  3. Dyslipidemia  - Lipid panel  4. Hypertension, benign  - amLODipine-valsartan (EXFORGE) 5-320 MG tablet; Take 1 tablet by mouth daily.  Dispense: 90 tablet; Refill: 1 - hydrochlorothiazide (HYDRODIURIL) 25 MG tablet; Take 1 tablet (25 mg total) by mouth daily.  Dispense: 90 tablet; Refill: 1 - CBC with Differential/Platelet - COMPLETE METABOLIC PANEL WITH GFR  5. Sick sinus syndrome (HCC)  Doing well   6. History of cardiac  pacemaker  Sees Dr. Clayborn Bigness  7. Microalbuminuria  On ARB  8. Paroxysmal a-fib ([place])  She denies palpitation, currently only on aspirin  9. Abnormal TSH  - Thyroid Panel With TSH  10. Long-term use of high-risk medication  - CBC with Differential/Platelet - COMPLETE METABOLIC PANEL WITH GFR  11. Vitamin D deficiency  - VITAMIN D 25 Hydroxy (Vit-D Deficiency, Fractures)

## 2015-12-10 ENCOUNTER — Other Ambulatory Visit: Payer: Self-pay

## 2015-12-10 ENCOUNTER — Other Ambulatory Visit: Payer: Self-pay | Admitting: Family Medicine

## 2015-12-10 DIAGNOSIS — D649 Anemia, unspecified: Secondary | ICD-10-CM

## 2015-12-10 LAB — VITAMIN D 25 HYDROXY (VIT D DEFICIENCY, FRACTURES): Vit D, 25-Hydroxy: 26 ng/mL — ABNORMAL LOW (ref 30–100)

## 2015-12-11 LAB — FERRITIN: Ferritin: 13 ng/mL — ABNORMAL LOW (ref 20–288)

## 2016-01-05 ENCOUNTER — Other Ambulatory Visit: Payer: Self-pay

## 2016-01-05 NOTE — Patient Outreach (Signed)
[facility]) Care Management  01/05/2016  Emma Campbell 30-Jan-1933 [id number]   Telephone call to patient for every other month outreach. Patient reports she cannot talk right now. Advised patient health coach would call another time.  Plan: RN Health Coach will attempt patient again in the month of November.  [name], RN, MSN [place] [phone]

## 2016-01-07 ENCOUNTER — Other Ambulatory Visit: Payer: Self-pay | Admitting: Family Medicine

## 2016-01-07 DIAGNOSIS — R790 Abnormal level of blood mineral: Secondary | ICD-10-CM

## 2016-01-07 LAB — POC HEMOCCULT BLD/STL (HOME/3-CARD/SCREEN)
Card #2 Fecal Occult Blod, POC: NEGATIVE
FECAL OCCULT BLD: NEGATIVE
FECAL OCCULT BLD: NEGATIVE

## 2016-01-10 ENCOUNTER — Telehealth: Payer: Self-pay

## 2016-01-10 NOTE — Telephone Encounter (Signed)
Patient was informed of negative results and said thanks for calling.

## 2016-01-10 NOTE — Telephone Encounter (Signed)
-----   Message from Steele Sizer, MD sent at 01/08/2016  4:47 PM EST ----- Regarding: negative hemoccult stools   ----- Message ----- From: Vonna Kotyk, CMA Sent: 01/07/2016  11:31 AM To: Steele Sizer, MD

## 2016-01-25 ENCOUNTER — Other Ambulatory Visit: Payer: Self-pay

## 2016-01-25 NOTE — Patient Outreach (Signed)
[facility] [place] [facility]) Care Management  [place]  01/25/2016   Emma Campbell 01/08/1933 [id number]  Subjective: Telephone call to patient for every other month call.  Patient reports she is doing good.  She reports seeing primary doctor last month.  She reports that her last sugar check was 127. Patient reports sugars run around the same range.  Patient A1c noted to be 6.7.  Discussed with patient importance of diet and continuing to follow.  She verbalized understanding.   Objective:   Encounter Medications:  Outpatient Encounter Prescriptions as of 01/25/2016  Medication Sig Note  . amiodarone (PACERONE) 200 MG tablet Take 100 mg by mouth daily. 09/22/2015: Taking half tablet  . amLODipine-valsartan (EXFORGE) 5-320 MG tablet Take 1 tablet by mouth daily.   [place] aspirin 81 MG tablet Take 81 mg by mouth daily.   . cholecalciferol (VITAMIN D) 1000 UNITS tablet Take 200 Units by mouth daily.   . folic acid (FOLVITE) 1 MG tablet Take 1 mg by mouth daily.   . hydrochlorothiazide (HYDRODIURIL) 25 MG tablet Take 1 tablet (25 mg total) by mouth daily.   . metFORMIN (GLUCOPHAGE-XR) 750 MG 24 hr tablet Take 1 tablet (750 mg total) by mouth every evening.   . rosuvastatin (CRESTOR) 5 MG tablet Take 1 tablet (5 mg total) by mouth daily.    No facility-administered encounter medications on file as of 01/25/2016.     Functional Status:  In your present state of health, do you have any difficulty performing the following activities: 12/09/2015 08/03/2015  Hearing? N N  Vision? N N  Difficulty concentrating or making decisions? N N  Walking or climbing stairs? N N  Dressing or bathing? N N  Doing errands, shopping? N N  Preparing Food and eating ? - -  Using the Toilet? - -  In the past six months, have you accidently leaked urine? - -  Do you have problems with loss of bowel control? - -  Managing your Finances? - -  Housekeeping or managing your Housekeeping? - -  Some recent  data might be hidden    Fall/Depression Screening: PHQ 2/9 Scores 01/25/2016 12/09/2015 11/26/2015 09/22/2015 08/03/2015 07/01/2015 05/06/2015  PHQ - 2 Score 0 0 0 0 0 0 0    Assessment: Patient continues to benefit from health coach outreach for disease management and support.    Plan:  [facility] CM Care Plan Problem One   Flowsheet Row Most Recent Value  Care Plan Problem One  Knowledge Deficit related to Diabetes  Role Documenting the Problem One  [place] for Problem One  Active  THN Long Term Goal (31-90 days)  Patient will decrease A1c by 0.2 points within 90 days.    THN Long Term Goal Start Date  01/25/16 [goal continued]  Interventions for Problem One Long Term Goal  RN Health Coach discussed with patient A1c goals and maintain low carbohydrate diet.    THN CM Short Term Goal #1 (0-30 days)  Patient will report checking blood sugar within 30 days.  THN CM Short Term Goal #1 Start Date  11/26/15  [place] CM Short Term Goal #1 Met Date  01/25/16  Interventions for Short Term Goal #1  Patient checking sugars regularly.       RN Health Coach will contact patient in the month of January and patient agrees to next outreach.  [name], RN, MSN [place] [phone]

## 2016-01-26 ENCOUNTER — Telehealth: Payer: Self-pay | Admitting: Family Medicine

## 2016-01-26 NOTE — Telephone Encounter (Signed)
Patient is scheduled to see Dr. Clayborn Bigness on 02/03/16.  [employer] Referral has been placed and the authorization # is [id number] for 6 visits from 02/03/16 - 08/01/2016.

## 2016-02-03 DIAGNOSIS — R011 Cardiac murmur, unspecified: Secondary | ICD-10-CM | POA: Diagnosis not present

## 2016-02-03 DIAGNOSIS — R001 Bradycardia, unspecified: Secondary | ICD-10-CM | POA: Diagnosis not present

## 2016-02-03 DIAGNOSIS — I10 Essential (primary) hypertension: Secondary | ICD-10-CM | POA: Diagnosis not present

## 2016-02-03 DIAGNOSIS — I495 Sick sinus syndrome: Secondary | ICD-10-CM | POA: Diagnosis not present

## 2016-02-03 DIAGNOSIS — E119 Type 2 diabetes mellitus without complications: Secondary | ICD-10-CM | POA: Diagnosis not present

## 2016-02-03 DIAGNOSIS — M1990 Unspecified osteoarthritis, unspecified site: Secondary | ICD-10-CM | POA: Diagnosis not present

## 2016-02-03 DIAGNOSIS — E785 Hyperlipidemia, unspecified: Secondary | ICD-10-CM | POA: Diagnosis not present

## 2016-02-03 DIAGNOSIS — I480 Paroxysmal atrial fibrillation: Secondary | ICD-10-CM | POA: Diagnosis not present

## 2016-03-07 ENCOUNTER — Other Ambulatory Visit: Payer: Self-pay

## 2016-03-07 NOTE — Patient Outreach (Signed)
[facility]) Care Management  03/07/2016  Emma Campbell 08-05-1932 [id number]   Telephone call to patient for every other month call. Patient informed health coach of switching insurance to [place] as of January 1st.  Informed patient that [place] is not a plan that [facility] services.  She verbalized understanding. Patient states she is doing well and no concerns at this time. Advised patient that I would close case at this time and will send a letter to her and her physician.  She verbalized understanding.   Plan: Will close case and notify care management assistant of case status.  [name], RN, MSN [place] [phone]

## 2016-04-11 ENCOUNTER — Encounter: Payer: Self-pay | Admitting: Family Medicine

## 2016-04-11 ENCOUNTER — Ambulatory Visit (INDEPENDENT_AMBULATORY_CARE_PROVIDER_SITE_OTHER): Payer: Medicare HMO | Admitting: Family Medicine

## 2016-04-11 VITALS — BP 118/72 | HR 93 | Temp 97.7°F | Resp 16 | Ht 63.0 in | Wt 131.1 lb

## 2016-04-11 DIAGNOSIS — E1122 Type 2 diabetes mellitus with diabetic chronic kidney disease: Secondary | ICD-10-CM | POA: Diagnosis not present

## 2016-04-11 DIAGNOSIS — R946 Abnormal results of thyroid function studies: Secondary | ICD-10-CM | POA: Diagnosis not present

## 2016-04-11 DIAGNOSIS — I10 Essential (primary) hypertension: Secondary | ICD-10-CM

## 2016-04-11 DIAGNOSIS — D649 Anemia, unspecified: Secondary | ICD-10-CM

## 2016-04-11 DIAGNOSIS — Z950 Presence of cardiac pacemaker: Secondary | ICD-10-CM | POA: Diagnosis not present

## 2016-04-11 DIAGNOSIS — E785 Hyperlipidemia, unspecified: Secondary | ICD-10-CM

## 2016-04-11 DIAGNOSIS — R809 Proteinuria, unspecified: Secondary | ICD-10-CM

## 2016-04-11 DIAGNOSIS — I480 Paroxysmal atrial fibrillation: Secondary | ICD-10-CM

## 2016-04-11 DIAGNOSIS — N182 Chronic kidney disease, stage 2 (mild): Secondary | ICD-10-CM

## 2016-04-11 DIAGNOSIS — R7989 Other specified abnormal findings of blood chemistry: Secondary | ICD-10-CM

## 2016-04-11 DIAGNOSIS — E1129 Type 2 diabetes mellitus with other diabetic kidney complication: Secondary | ICD-10-CM

## 2016-04-11 DIAGNOSIS — D539 Nutritional anemia, unspecified: Secondary | ICD-10-CM | POA: Diagnosis not present

## 2016-04-11 LAB — CBC WITH DIFFERENTIAL/PLATELET
BASOS ABS: 49 {cells}/uL (ref 0–200)
Basophils Relative: 1 %
EOS PCT: 3 %
Eosinophils Absolute: 147 cells/uL (ref 15–500)
HCT: 34.4 % — ABNORMAL LOW (ref 35.0–45.0)
Hemoglobin: 11.2 g/dL — ABNORMAL LOW (ref 11.7–15.5)
LYMPHS ABS: 1813 {cells}/uL (ref 850–3900)
LYMPHS PCT: 37 %
MCH: 29.6 pg (ref 27.0–33.0)
MCHC: 32.6 g/dL (ref 32.0–36.0)
MCV: 91 fL (ref 80.0–100.0)
MONOS PCT: 7 %
MPV: 10.2 fL (ref 7.5–12.5)
Monocytes Absolute: 343 cells/uL (ref 200–950)
NEUTROS PCT: 52 %
Neutro Abs: 2548 cells/uL (ref 1500–7800)
PLATELETS: 218 10*3/uL (ref 140–400)
RBC: 3.78 MIL/uL — AB (ref 3.80–5.10)
RDW: 14 % (ref 11.0–15.0)
WBC: 4.9 10*3/uL (ref 3.8–10.8)

## 2016-04-11 LAB — POCT GLYCOSYLATED HEMOGLOBIN (HGB A1C): Hemoglobin A1C: 6.6

## 2016-04-11 MED ORDER — HYDROCHLOROTHIAZIDE 25 MG PO TABS: 25.0000 mg | ORAL_TABLET | Freq: Every day | ORAL | 1 refills | Status: DC

## 2016-04-11 MED ORDER — AMLODIPINE BESYLATE-VALSARTAN 5-320 MG PO TABS: 1.0000 | ORAL_TABLET | Freq: Every day | ORAL | 1 refills | Status: DC

## 2016-04-11 MED ORDER — METFORMIN HCL ER 750 MG PO TB24: 750.0000 mg | ORAL_TABLET | Freq: Every evening | ORAL | 1 refills | Status: DC

## 2016-04-11 NOTE — Progress Notes (Signed)
Name: Emma Campbell   MRN: [id number]    DOB: Feb 08, 1933   Date:04/11/2016       Progress Note  Subjective  Chief Complaint  Chief Complaint  Patient presents with  . Diabetes  . Hypertension  . Hyperlipidemia    HPI   DMII with renal manifestation: CKI and microalbuminuria. HgbA1C has been normal, she denies hypoglycemia episodes. She feels well. Glucose at home is getting checked a couple of times weekly and is running around 120's-130's. She is complaint with her diet. She denies polyphagia, polydipsia or polyuria. Eye exam is scheduled for Feb 19th, 2018. Continue ARB for kidney protection. She no longer has diarrhea since changed from immediate release to ER metformin   HTN: taking medication and denies side effects of medication, bp at home is around 130's.. No orthostatic changes , no chest pain or SOB.  Hyperlipidemia: taking Crestor5 mg and denies myalgias. Labs done in 11/2015 were at goal and reviewed with patient.  Sick Sinus Syndrome: she had a pace maker placed in 2006 and is taking Amiodarone since 2006, she is now down to 100 mg of Amiodarone daily, last TSH was back to normal ,  no chest pain or palpitation, no syncope. Sees Dr. Clayborn Bigness    Vitamin D deficiency: still taking supplementation, denies fatigue  Anemia: mild it may be from [facility], but we will check iron studies and B12, hemoccult stools negative done 11/2015   Patient Active Problem List   Diagnosis Date Noted  . Type 2 diabetes mellitus with stage 2 chronic kidney disease, without long-term current use of insulin ([place]) 08/03/2015  . Arthritis, degenerative 05/03/2015  . Menopausal and perimenopausal disorder 05/03/2015  . Chronic kidney disease (CKD), stage III (moderate) 11/30/2014  . High risk medication use 11/30/2014  . Diverticulosis 11/30/2014  . Fibrocystic breast disease 11/30/2014  . Elevated LFTs 11/30/2014  . Systolic ejection murmur [id number]  . Paroxysmal a-fib ([place]) 11/30/2014  .  Microalbuminuria 07/31/2014  . Elevated TSH 07/31/2014  . History of cardiac pacemaker 07/31/2014  . Sick sinus syndrome ([place]) 08/13/2013  . Hypercholesteremia 08/13/2013  . Hypertension, benign 08/13/2013  . Diabetes mellitus with renal manifestations, controlled ([place]) 08/13/2013  . Avitaminosis D 04/12/2009    Past Surgical History:  Procedure Laterality Date  . PACEMAKER INSERTION  2006    Family History  Problem Relation Age of Onset  . Hypertension Mother   . Stroke Mother   . Diabetes Daughter     Social History   Social History  . Marital status: Widowed    Spouse name: N/A  . Number of children: 1  . Years of education: 72   Occupational History  . Retired [employer] at [place] Topics  . Smoking status: Never Smoker  . Smokeless tobacco: Never Used  . Alcohol use No  . Drug use: No  . Sexual activity: No   Other Topics Concern  . Not on file   Social History Narrative  . No narrative on file     Current Outpatient Prescriptions:  .  amiodarone (PACERONE) 200 MG tablet, Take 100 mg by mouth daily., Disp: , Rfl:  .  amLODipine-valsartan (EXFORGE) 5-320 MG tablet, Take 1 tablet by mouth daily., Disp: 90 tablet, Rfl: 1 .  aspirin 81 MG tablet, Take 81 mg by mouth daily., Disp: , Rfl:  .  cholecalciferol (VITAMIN D) 1000 UNITS tablet, Take 200 Units by mouth daily., Disp: , Rfl:  .  folic acid (FOLVITE) 1 MG tablet, Take 1 mg by mouth daily., Disp: , Rfl:  .  hydrochlorothiazide (HYDRODIURIL) 25 MG tablet, Take 1 tablet (25 mg total) by mouth daily., Disp: 90 tablet, Rfl: 1 .  metFORMIN (GLUCOPHAGE-XR) 750 MG 24 hr tablet, Take 1 tablet (750 mg total) by mouth every evening., Disp: 90 tablet, Rfl: 1 .  rosuvastatin (CRESTOR) 5 MG tablet, Take 1 tablet (5 mg total) by mouth daily., Disp: 90 tablet, Rfl: 4  Allergies  Allergen Reactions  . Ace Inhibitors      ROS  Constitutional: Negative for fever or weight change.   Respiratory: Negative for cough and shortness of breath.   Cardiovascular: Negative for chest pain or palpitations.  Gastrointestinal: Negative for abdominal pain, no bowel changes.  Musculoskeletal: Negative for gait problem or joint swelling.  Skin: Negative for rash.  Neurological: Negative for dizziness or headache.  No other specific complaints in a complete review of systems (except as listed in HPI above).  Objective  Vitals:   04/11/16 1001  BP: 118/72  Pulse: 93  Resp: 16  Temp: 97.7 F (36.5 C)  SpO2: 97%  Weight: 131 lb 2 oz (59.5 kg)  Height: 5\' 3"  (1.6 m)    Body mass index is 23.23 kg/m.  Physical Exam  Constitutional: Patient appears well-developed and well-nourished. No distress.  HEENT: head atraumatic, normocephalic, pupils equal and reactive to light, neck supple, throat within normal limits Cardiovascular: Normal rate, regular rhythm and normal heart sounds.  No murmur heard. No BLE edema. Pulmonary/Chest: Effort normal and breath sounds normal. No respiratory distress. Abdominal: Soft.  There is no tenderness. Psychiatric: Patient has a normal mood and affect. behavior is normal. Judgment and thought content normal.  Recent Results (from the past 2160 hour(s))  POCT HgB A1C     Status: Normal   Collection Time: 04/11/16 10:48 AM  Result Value Ref Range   Hemoglobin A1C 6.6      PHQ2/9: Depression screen [facility] 2/9 04/11/2016 01/25/2016 12/09/2015 11/26/2015 09/22/2015  Decreased Interest 0 0 0 0 0  Down, Depressed, Hopeless 0 0 0 0 0  PHQ - 2 Score 0 0 0 0 0    Fall Risk: Fall Risk  04/11/2016 01/25/2016 12/09/2015 11/26/2015 09/22/2015  Falls in the past year? No No No No No     Functional Status Survey: Is the patient deaf or have difficulty hearing?: No Does the patient have difficulty seeing, even when wearing glasses/contacts?: No Does the patient have difficulty concentrating, remembering, or making decisions?: No Does the patient have  difficulty walking or climbing stairs?: No Does the patient have difficulty dressing or bathing?: No Does the patient have difficulty doing errands alone such as visiting a doctor's office or shopping?: No   Assessment & Plan  1. Type 2 diabetes mellitus with stage 2 chronic kidney disease, without long-term current use of insulin (HCC)  - POCT HgB A1C - amLODipine-valsartan (EXFORGE) 5-320 MG tablet; Take 1 tablet by mouth daily.  Dispense: 90 tablet; Refill: 1 - metFORMIN (GLUCOPHAGE-XR) 750 MG 24 hr tablet; Take 1 tablet (750 mg total) by mouth every evening.  Dispense: 90 tablet; Refill: 1  2. Dyslipidemia  Continue to take Crestor  3. Hypertension, benign  - amLODipine-valsartan (EXFORGE) 5-320 MG tablet; Take 1 tablet by mouth daily.  Dispense: 90 tablet; Refill: 1 - hydrochlorothiazide (HYDRODIURIL) 25 MG tablet; Take 1 tablet (25 mg total) by mouth daily.  Dispense: 90 tablet; Refill: 1  4. History  of cardiac pacemaker  Doing well  5. Paroxysmal a-fib (HCC)   Sees Dr. Clayborn Bigness, she is only on aspirin 81 mg since pacemaker was placed.   6. Abnormal TSH  Normalized when pacerone dose was cut down to 100 mg daily   7. Type 2 diabetes mellitus with microalbuminuria, without long-term current use of insulin (HCC)  - amLODipine-valsartan (EXFORGE) 5-320 MG tablet; Take 1 tablet by mouth daily.  Dispense: 90 tablet; Refill: 1  8. Anemia, unspecified type  - Vitamin B12 - Iron, TIBC and Ferritin Panel - CBC with Differential/Platelet

## 2016-04-12 LAB — IRON,TIBC AND FERRITIN PANEL
%SAT: 15 % (ref 11–50)
FERRITIN: 11 ng/mL — AB (ref 20–288)
Iron: 68 ug/dL (ref 45–160)
TIBC: 442 ug/dL (ref 250–450)

## 2016-04-12 LAB — VITAMIN B12: VITAMIN B 12: 215 pg/mL (ref 200–1100)

## 2016-04-12 LAB — TSH: TSH: 3.26 m[IU]/L

## 2016-04-17 DIAGNOSIS — H2513 Age-related nuclear cataract, bilateral: Secondary | ICD-10-CM | POA: Diagnosis not present

## 2016-04-17 LAB — HM DIABETES EYE EXAM

## 2016-04-18 ENCOUNTER — Encounter: Payer: Self-pay | Admitting: Family Medicine

## 2016-06-13 ENCOUNTER — Ambulatory Visit (INDEPENDENT_AMBULATORY_CARE_PROVIDER_SITE_OTHER): Payer: Medicare HMO | Admitting: Family Medicine

## 2016-06-13 ENCOUNTER — Encounter: Payer: Self-pay | Admitting: Family Medicine

## 2016-06-13 VITALS — BP 142/76 | HR 86 | Temp 97.9°F | Resp 16 | Ht 60.5 in | Wt 129.6 lb

## 2016-06-13 DIAGNOSIS — M8580 Other specified disorders of bone density and structure, unspecified site: Secondary | ICD-10-CM

## 2016-06-13 DIAGNOSIS — D519 Vitamin B12 deficiency anemia, unspecified: Secondary | ICD-10-CM

## 2016-06-13 DIAGNOSIS — D509 Iron deficiency anemia, unspecified: Secondary | ICD-10-CM

## 2016-06-13 DIAGNOSIS — E538 Deficiency of other specified B group vitamins: Secondary | ICD-10-CM | POA: Diagnosis not present

## 2016-06-13 DIAGNOSIS — Z0000 Encounter for general adult medical examination without abnormal findings: Secondary | ICD-10-CM | POA: Diagnosis not present

## 2016-06-13 MED ORDER — CYANOCOBALAMIN 1000 MCG/ML IJ SOLN
1000.0000 ug | Freq: Once | INTRAMUSCULAR | Status: AC
  Administered 2016-06-13: 1000 ug via INTRAMUSCULAR

## 2016-06-13 NOTE — Progress Notes (Signed)
Name: Emma Campbell   MRN: [id number]    DOB: December 14, 1932   Date:06/13/2016       Progress Note  Subjective  Chief Complaint  Chief Complaint  Patient presents with  . Annual Exam    HPI  Functional ability/safety issues: No Issues Hearing issues: Addressed  Activities of daily living: Discussed Home safety issues: No Issues  End Of Life Planning: Offered verbal information regarding advanced directives, healthcare power of attorney.  Preventative care, Health maintenance, Preventative health measures discussed.  Preventative screenings discussed today: lab work, colonoscopy - check hemoccult,  Mammogram not interested, DEXA.  Low Dose CT Chest recommended if Age 8-80 years, 30 pack-year currently smoking OR have quit w/in 15years.   Lifestyle risk factor issued reviewed: Diet, exercise, weight management, advised patient smoking is not healthy, nutrition/diet.  Preventative health measures discussed (5-10 year plan).  Reviewed and recommended vaccinations: - Pneumovax  - Prevnar  - Annual Influenza - Zostavax - Tdap   Depression screening: Done Fall risk screening: Done Discuss ADLs/IADLs: Done  Current medical providers: See HPI  Other health risk factors identified this visit: No other issues Cognitive impairment issues: None identified  All above discussed with patient. Appropriate education, counseling and referral will be made based upon the above.   Anemia: iron deficiency with low iron, but also has low B12. She denies fatigue, no blood in stools. She states she does not eat much red meat, eats mostly fish and chicken   Osteopenia: she took Fosamax in the past, but has been off for years, we will recheck study, she is not currently taking calcium but is taking Vitamin D supplementation. No history of fractures  Patient Active Problem List   Diagnosis Date Noted  . Type 2 diabetes mellitus with stage 2 chronic kidney disease, without long-term current use of  insulin ([place]) 08/03/2015  . Arthritis, degenerative 05/03/2015  . Menopausal and perimenopausal disorder 05/03/2015  . Chronic kidney disease (CKD), stage III (moderate) 11/30/2014  . High risk medication use 11/30/2014  . Diverticulosis 11/30/2014  . Fibrocystic breast disease 11/30/2014  . Elevated LFTs 11/30/2014  . Systolic ejection murmur 97/94/8016  . Paroxysmal A-fib ([place]) 11/30/2014  . Microalbuminuria 07/31/2014  . Elevated TSH 07/31/2014  . History of cardiac pacemaker 07/31/2014  . Sick sinus syndrome ([place]) 08/13/2013  . Hypercholesteremia 08/13/2013  . Hypertension, benign 08/13/2013  . Diabetes mellitus with renal manifestations, controlled ([place]) 08/13/2013  . Avitaminosis D 04/12/2009    Past Surgical History:  Procedure Laterality Date  . PACEMAKER INSERTION  2006    Family History  Problem Relation Age of Onset  . Hypertension Mother   . Stroke Mother   . Diabetes Daughter   . Stroke Father   . Hypertension Maternal Grandmother     Social History   Social History  . Marital status: Widowed    Spouse name: N/A  . Number of children: 1  . Years of education: 110   Occupational History  . Retired [employer] at [employer] Topics  . Smoking status: Never Smoker  . Smokeless tobacco: Never Used  . Alcohol use No  . Drug use: No  . Sexual activity: No   Other Topics Concern  . Not on file   Social History Narrative  . No narrative on file     Current Outpatient Prescriptions:  .  amiodarone (PACERONE) 200 MG tablet, Take 100 mg by mouth daily., Disp: , Rfl:  .  amLODipine-valsartan (EXFORGE) 5-320 MG tablet, Take 1 tablet by mouth daily., Disp: 90 tablet, Rfl: 1 .  aspirin 81 MG tablet, Take 81 mg by mouth daily., Disp: , Rfl:  .  cholecalciferol (VITAMIN D) 1000 UNITS tablet, Take 200 Units by mouth daily., Disp: , Rfl:  .  folic acid (FOLVITE) 1 MG tablet, Take 1 mg by mouth daily., Disp: , Rfl:  .   hydrochlorothiazide (HYDRODIURIL) 25 MG tablet, Take 1 tablet (25 mg total) by mouth daily., Disp: 90 tablet, Rfl: 1 .  metFORMIN (GLUCOPHAGE-XR) 750 MG 24 hr tablet, Take 1 tablet (750 mg total) by mouth every evening., Disp: 90 tablet, Rfl: 1 .  rosuvastatin (CRESTOR) 5 MG tablet, Take 1 tablet (5 mg total) by mouth daily., Disp: 90 tablet, Rfl: 4  Allergies  Allergen Reactions  . Ace Inhibitors      ROS  Constitutional: Negative for fever or weight change.  Respiratory: Negative for cough and shortness of breath.   Cardiovascular: Negative for chest pain or palpitations.  Gastrointestinal: Negative for abdominal pain, no bowel changes.  Musculoskeletal: Negative for gait problem or joint swelling.  Skin: Negative for rash.  Neurological: Negative for dizziness or headache.  No other specific complaints in a complete review of systems (except as listed in HPI above).  Objective  Vitals:   06/13/16 1044  BP: (!) 142/76  Pulse: 86  Resp: 16  Temp: 97.9 F (36.6 C)  TempSrc: Oral  SpO2: 99%  Weight: 129 lb 9.6 oz (58.8 kg)  Height: 5' 0.5" (1.537 m)    Body mass index is 24.89 kg/m.  Physical Exam  Constitutional: Patient appears well-developed and well-nourished. No distress.  HENT: Head: Normocephalic and atraumatic. Ears: B TMs ok, no erythema or effusion; Nose: Nose normal. Mouth/Throat: Oropharynx is clear and moist. No oropharyngeal exudate.  Eyes: Conjunctivae and EOM are normal. Pupils are equal, round, and reactive to light. No scleral icterus.  Neck: Normal range of motion. Neck supple. No JVD present. No thyromegaly present.  Cardiovascular: Normal rate, regular rhythm and normal heart sounds.  No murmur heard. No BLE edema. Pulmonary/Chest: Effort normal and breath sounds normal. No respiratory distress. Abdominal: Soft. Bowel sounds are normal, no distension. There is no tenderness. no masses Breast: no lumps or masses, no nipple discharge or rashes FEMALE  GENITALIA:  External genitalia normal External urethra normal RECTAL: not done Musculoskeletal: Normal range of motion, no joint effusions. No gross deformities Neurological: he is alert and oriented to person, place, and time. No cranial nerve deficit. Coordination, balance, strength, speech and gait are normal.  Skin: Skin is warm and dry. No rash noted. No erythema.  Psychiatric: Patient has a normal mood and affect. behavior is normal. Judgment and thought content normal.  Recent Results (from the past 2160 hour(s))  POCT HgB A1C     Status: Normal   Collection Time: 04/11/16 10:48 AM  Result Value Ref Range   Hemoglobin A1C 6.6   Vitamin B12     Status: None   Collection Time: 04/11/16 11:03 AM  Result Value Ref Range   Vitamin B-12 215 200 - 1,100 pg/mL  Iron, TIBC and Ferritin Panel     Status: Abnormal   Collection Time: 04/11/16 11:03 AM  Result Value Ref Range   Ferritin 11 (L) 20 - 288 ng/mL   Iron 68 45 - 160 ug/dL   TIBC 442 250 - 450 ug/dL   %SAT 15 11 - 50 %  CBC with Differential/Platelet  Status: Abnormal   Collection Time: 04/11/16 11:03 AM  Result Value Ref Range   WBC 4.9 3.8 - 10.8 K/uL   RBC 3.78 (L) 3.80 - 5.10 MIL/uL   Hemoglobin 11.2 (L) 11.7 - 15.5 g/dL   HCT 34.4 (L) 35.0 - 45.0 %   MCV 91.0 80.0 - 100.0 fL   MCH 29.6 27.0 - 33.0 pg   MCHC 32.6 32.0 - 36.0 g/dL   RDW 14.0 11.0 - 15.0 %   Platelets 218 140 - 400 K/uL   MPV 10.2 7.5 - 12.5 fL   Neutro Abs 2,548 1,500 - 7,800 cells/uL   Lymphs Abs 1,813 850 - 3,900 cells/uL   Monocytes Absolute 343 200 - 950 cells/uL   Eosinophils Absolute 147 15 - 500 cells/uL   Basophils Absolute 49 0 - 200 cells/uL   Neutrophils Relative % 52 %   Lymphocytes Relative 37 %   Monocytes Relative 7 %   Eosinophils Relative 3 %   Basophils Relative 1 %   Smear Review Criteria for review not met   TSH     Status: None   Collection Time: 04/11/16 11:03 AM  Result Value Ref Range   TSH 3.26 mIU/L    Comment:    Reference Range   > or = 20 Years  0.40-4.50   Pregnancy Range First trimester  0.26-2.66 Second trimester 0.55-2.73 Third trimester  0.43-2.91     HM DIABETES EYE EXAM     Status: None   Collection Time: 04/17/16 12:00 AM  Result Value Ref Range   HM Diabetic Eye Exam No Retinopathy No Retinopathy    Comment: [facility], Dr. Sandra Cockayne      PHQ2/9: Depression screen [facility] 2/9 06/13/2016 04/11/2016 01/25/2016 12/09/2015 11/26/2015  Decreased Interest 0 0 0 0 0  Down, Depressed, Hopeless 0 0 0 0 0  PHQ - 2 Score 0 0 0 0 0    Fall Risk: Fall Risk  06/13/2016 04/11/2016 01/25/2016 12/09/2015 11/26/2015  Falls in the past year? No No No No No    Functional Status Survey: Is the patient deaf or have difficulty hearing?: No Does the patient have difficulty seeing, even when wearing glasses/contacts?: No Does the patient have difficulty concentrating, remembering, or making decisions?: No Does the patient have difficulty walking or climbing stairs?: No Does the patient have difficulty dressing or bathing?: No Does the patient have difficulty doing errands alone such as visiting a doctor's office or shopping?: No    Assessment & Plan  1. Medicare annual wellness visit, subsequent  Discussed importance of 150 minutes of physical activity weekly, eat two servings of fish weekly, eat one serving of tree nuts ( cashews, pistachios, pecans, almonds.Marland Kitchen) every other day, eat 6 servings of fruit/vegetables daily and drink plenty of water and avoid sweet beverages.   2. B12 deficiency  - cyanocobalamin ((VITAMIN B-12)) injection 1,000 mcg; Inject 1 mL (1,000 mcg total) into the muscle once.  3. Iron deficiency anemia, unspecified iron deficiency anemia type  She will bring hemoccult cards when she collects it, start iron supplements otc , after collection of specimen  4. Osteopenia, unspecified location  - DG Bone Density; Future

## 2016-06-30 ENCOUNTER — Other Ambulatory Visit: Payer: Self-pay | Admitting: Family Medicine

## 2016-06-30 DIAGNOSIS — D649 Anemia, unspecified: Secondary | ICD-10-CM

## 2016-06-30 LAB — POC HEMOCCULT BLD/STL (HOME/3-CARD/SCREEN)
Card #3 Fecal Occult Blood, POC: NEGATIVE
FECAL OCCULT BLD: NEGATIVE
FECAL OCCULT BLD: POSITIVE — AB

## 2016-07-19 ENCOUNTER — Ambulatory Visit (INDEPENDENT_AMBULATORY_CARE_PROVIDER_SITE_OTHER): Payer: Medicare HMO

## 2016-07-19 ENCOUNTER — Ambulatory Visit
Admission: RE | Admit: 2016-07-19 | Discharge: 2016-07-19 | Disposition: A | Discharge status: Home / Self Care | Payer: Commercial Managed Care - HMO | Source: Ambulatory Visit | Attending: Family Medicine | Admitting: Family Medicine

## 2016-07-19 DIAGNOSIS — M8580 Other specified disorders of bone density and structure, unspecified site: Secondary | ICD-10-CM | POA: Diagnosis not present

## 2016-07-19 DIAGNOSIS — Z13820 Encounter for screening for osteoporosis: Secondary | ICD-10-CM | POA: Insufficient documentation

## 2016-07-19 DIAGNOSIS — E538 Deficiency of other specified B group vitamins: Secondary | ICD-10-CM | POA: Diagnosis not present

## 2016-07-19 DIAGNOSIS — M85852 Other specified disorders of bone density and structure, left thigh: Secondary | ICD-10-CM | POA: Diagnosis not present

## 2016-07-19 MED ORDER — CYANOCOBALAMIN 1000 MCG/ML IJ SOLN
1000.0000 ug | Freq: Once | INTRAMUSCULAR | Status: AC
  Administered 2016-07-19: 1000 ug via INTRAMUSCULAR

## 2016-07-26 ENCOUNTER — Other Ambulatory Visit: Payer: Self-pay | Admitting: Family Medicine

## 2016-07-26 ENCOUNTER — Encounter: Payer: Self-pay | Admitting: Family Medicine

## 2016-07-26 DIAGNOSIS — E1122 Type 2 diabetes mellitus with diabetic chronic kidney disease: Secondary | ICD-10-CM

## 2016-07-26 DIAGNOSIS — N182 Chronic kidney disease, stage 2 (mild): Principal | ICD-10-CM

## 2016-08-03 ENCOUNTER — Ambulatory Visit (INDEPENDENT_AMBULATORY_CARE_PROVIDER_SITE_OTHER): Payer: Medicare HMO | Admitting: Gastroenterology

## 2016-08-03 ENCOUNTER — Other Ambulatory Visit: Payer: Self-pay

## 2016-08-03 ENCOUNTER — Encounter: Payer: Self-pay | Admitting: Gastroenterology

## 2016-08-03 VITALS — BP 123/65 | HR 84 | Temp 97.9°F | Ht 60.5 in | Wt 130.0 lb

## 2016-08-03 DIAGNOSIS — K921 Melena: Secondary | ICD-10-CM | POA: Diagnosis not present

## 2016-08-03 DIAGNOSIS — D509 Iron deficiency anemia, unspecified: Secondary | ICD-10-CM

## 2016-08-03 NOTE — Progress Notes (Signed)
Gastroenterology Consultation  Referring Provider:     Steele Sizer, MD Primary Care Physician:  Steele Sizer, MD Primary Gastroenterologist:  Dr. Allen Norris     Reason for Consultation:     Anemia        HPI:   Emma Campbell is a 83 y.o. y/o female referred for consultation & management of Anemia by Dr. [name], Drue Stager, MD.  This patient comes in today with a history of anemia.  The patient reports that she had a colonoscopy many years ago and states it was with a Dr. Truman Hayward in town.  The patient denies any black stools or bloody stools.  She also denies any nausea or vomiting. The patient's hemoglobin was low at 11.2 with her iron studies showing a ferritin of 11. The patient also had fecal occult positive test.  There is no report of any unexplained weight loss fevers or chills.  Past Medical History:  Diagnosis Date  . Chronic kidney disease, stage III (moderate)   . Diverticulosis   . Elevated LFTs   . Essential hypertension, malignant   . Fibrocystic breast disease   . Heart murmur   . Hyperlipidemia   . Menopausal problem   . Osteoarthrosis   . Osteoporosis   . Peripheral autonomic neuropathy   . Sick sinus syndrome ([facility])    Dr. Alfredo Batty  . Type II diabetes mellitus with renal manifestations ([place])   . Vitamin D deficiency     Past Surgical History:  Procedure Laterality Date  . PACEMAKER INSERTION  2006    Prior to Admission medications   Medication Sig Start Date End Date Taking? Authorizing Provider  amiodarone (PACERONE) 200 MG tablet Take 100 mg by mouth daily.   Yes Callwood, Dwayne D, MD  amLODipine-valsartan (EXFORGE) 5-320 MG tablet Take 1 tablet by mouth daily. 04/11/16  Yes Steele Sizer, MD  aspirin 81 MG tablet Take 81 mg by mouth daily.   Yes [provider]  cholecalciferol (VITAMIN D) 1000 UNITS tablet Take 200 Units by mouth daily.   Yes [provider]  folic acid (FOLVITE) 1 MG tablet Take 1 mg by mouth daily.   Yes  [provider]  hydrochlorothiazide (HYDRODIURIL) 25 MG tablet Take 1 tablet (25 mg total) by mouth daily. 04/11/16  Yes Sowles, Drue Stager, MD  metFORMIN (GLUCOPHAGE-XR) 750 MG 24 hr tablet TAKE 1 TABLET EVERY EVENING 07/26/16  Yes Hubbard Hartshorn, FNP  rosuvastatin (CRESTOR) 5 MG tablet Take 1 tablet (5 mg total) by mouth daily. 08/03/15  Yes Steele Sizer, MD    Family History  Problem Relation Age of Onset  . Hypertension Mother   . Stroke Mother   . Diabetes Daughter   . Stroke Father   . Hypertension Maternal Grandmother      Social History  Substance Use Topics  . Smoking status: Never Smoker  . Smokeless tobacco: Never Used  . Alcohol use No    Allergies as of 08/03/2016 - Review Complete 08/03/2016  Allergen Reaction Noted  . Ace inhibitors  05/03/2015    Review of Systems:    All systems reviewed and negative except where noted in HPI.   Physical Exam:  BP 123/65   Pulse 84   Temp 97.9 F (36.6 C) (Oral)   Ht 5' 0.5" (1.537 m)   Wt 130 lb (59 kg)   BMI 24.97 kg/m  No LMP recorded. Patient is postmenopausal. Psych:  Alert and cooperative. Normal mood and affect. General:  Alert,  Well-developed, well-nourished, pleasant and cooperative in NAD Head:  Normocephalic and atraumatic. Eyes:  Sclera clear, no icterus.   Conjunctiva pink. Ears:  Normal auditory acuity. Nose:  No deformity, discharge, or lesions. Mouth:  No deformity or lesions,oropharynx pink & moist. Neck:  Supple; no masses or thyromegaly. Lungs:  Respirations even and unlabored.  Clear throughout to auscultation.   No wheezes, crackles, or rhonchi. No acute distress. Heart:  Regular rate and rhythm; no murmurs, clicks, rubs, or gallops. Abdomen:  Normal bowel sounds.  No bruits.  Soft, non-tender and non-distended without masses, hepatosplenomegaly or hernias noted.  No guarding or rebound tenderness.  Negative Carnett sign.   Rectal:  Deferred.  Msk:  Symmetrical without gross  deformities.  Good, equal movement & strength bilaterally. Pulses:  Normal pulses noted. Extremities:  No clubbing or edema.  No cyanosis. Neurologic:  Alert and oriented x3;  grossly normal neurologically. Skin:  Intact without significant lesions or rashes.  No jaundice. Lymph Nodes:  No significant cervical adenopathy. Psych:  Alert and cooperative. Normal mood and affect.  Imaging Studies: Dg Bone Density  Result Date: 07/19/2016 EXAM: DUAL X-RAY ABSORPTIOMETRY (DXA) FOR BONE MINERAL DENSITY IMPRESSION: Dear Dr. [name], Your patient Emma Campbell completed a BMD test on 07/19/2016 using the [place] (analysis version: 14.10) manufactured by [employer]. The following summarizes the results of our evaluation. PATIENT BIOGRAPHICAL: Name: Emma Campbell, Emma Campbell Patient ID: [id number] Birth Date: 06-Nov-1932 Height: 60.5 in. Gender: Female Exam Date: 07/19/2016 Weight: 128.0 lbs. Indications: A. Fib, Diabetic, Height Loss, Postmenopausal Fractures: Treatments: Vitamin D ASSESSMENT: The BMD measured at Femur Neck Left is 0.857 g/cm2 with a T-score of -1.3. This patient is considered osteopenic according to [facility]) criteria. Lumbar spine was not utilized due to advanced degenerative changes. Site Region Measured Measured WHO Young Adult BMD Date       Age      Classification T-score DualFemur Neck Left 07/19/[age] 82.3 Oste[age] -1.3 0.857 g/cm2 DualFemur Neck Left 08/28/2011 77.4 Normal -0.9 0.906 g/cm2 Left Forearm Radius 33% 07/19/2016 82.3 Osteopenia -1.3 0.760 g/cm2 Left Forearm Radius 33% 08/28/2011 77.4 Normal -0.5 0.836 g/cm2 World Health Organization [facility]) criteria for post-menopausal, Caucasian Women: Normal:       T-score at or above -1 SD Osteopenia:   T-score between -1 and -2.5 SD Osteoporosis: T-score at or below -2.5 SD RECOMMENDATIONS: [place] recommends that FDA-approved medical therapies be considered in postmenopausal women and men age  81 or older with a: 1. Hip or vertebral (clinical or morphometric) fracture. 2. T-score of < -2.5 at the spine or hip. 3. Ten-year fracture probability by FRAX of 3% or greater for hip fracture or 20% or greater for major osteoporotic fracture. All treatment decisions require clinical judgment and consideration of individual patient factors, including patient preferences, co-morbidities, previous drug use, risk factors not captured in the FRAX model (e.g. falls, vitamin D deficiency, increased bone turnover, interval significant decline in bone density) and possible under - or over-estimation of fracture risk by FRAX. All patients should ensure an adequate intake of dietary calcium (1200 mg/d) and vitamin D (800 IU daily) unless contraindicated. FOLLOW-UP: People with diagnosed cases of osteoporosis or at high risk for fracture should have regular bone mineral density tests. For patients eligible for Medicare, routine testing is allowed once every 2 years. The testing frequency can be increased to one year for patients who have rapidly progressing disease, those who are receiving or discontinuing medical  therapy to restore bone mass, or have additional risk factors. I have reviewed this report, and agree with the above findings. [facility] Dear Dr. [name], Your patient Emma Campbell completed a FRAX assessment on 07/19/2016 using the [place] (analysis version: 14.10) manufactured by [employer]. The following summarizes the results of our evaluation. PATIENT BIOGRAPHICAL: Name: Emma Campbell, Emma Campbell Patient ID: [id number] Birth Date: 07-May-1932 Height:    60.5 in. Gender:     [age]    [age]       Weight:    128.0 lbs. Ethnicity:  Black                            Exam Date: 07/19/2016 FRAX* RESULTS:  (version: 3.5) 10-year Probability of Fracture1 Major Osteoporotic Fracture2 Hip Fracture 5.9% 1.4% Population: [place] (Black) Risk Factors: None Based on Femur (Left) Neck BMD 1 -The 10-year  probability of fracture may be lower than reported if the patient has received treatment. 2 -Major Osteoporotic Fracture: Clinical Spine, Forearm, Hip or Shoulder *FRAX is a Materials engineer of the [employer] for Metabolic Bone Disease, a [place] (WHO) [facility]. ASSESSMENT: The probability of a major osteoporotic fracture is 5.9% within the next ten years. The probability of a hip fracture is 1.4% within the next ten years. . Electronically Signed   By: Rolm Baptise M.D.   On: 07/19/2016 12:13    Assessment and Plan:   Emma Campbell is a 83 y.o. y/o female who has heme positive stools with anemia and a low ferritin.  The patient will be set up for an EGD and colonoscopy. I have discussed risks & benefits which include, but are not limited to, bleeding, infection, perforation & drug reaction.  The patient agrees with this plan & written consent will be obtained.     Lucilla Lame, MD. Marval Regal   Note: This dictation was prepared with Dragon dictation along with smaller phrase technology. Any transcriptional errors that result from this process are unintentional.

## 2016-08-10 ENCOUNTER — Encounter: Payer: Self-pay | Admitting: Family Medicine

## 2016-08-10 ENCOUNTER — Ambulatory Visit (INDEPENDENT_AMBULATORY_CARE_PROVIDER_SITE_OTHER): Payer: Medicare HMO | Admitting: Family Medicine

## 2016-08-10 VITALS — BP 124/64 | HR 87 | Temp 97.5°F | Resp 16 | Ht 61.0 in | Wt 129.1 lb

## 2016-08-10 DIAGNOSIS — E1129 Type 2 diabetes mellitus with other diabetic kidney complication: Secondary | ICD-10-CM | POA: Diagnosis not present

## 2016-08-10 DIAGNOSIS — R946 Abnormal results of thyroid function studies: Secondary | ICD-10-CM | POA: Diagnosis not present

## 2016-08-10 DIAGNOSIS — M8580 Other specified disorders of bone density and structure, unspecified site: Secondary | ICD-10-CM

## 2016-08-10 DIAGNOSIS — E538 Deficiency of other specified B group vitamins: Secondary | ICD-10-CM | POA: Diagnosis not present

## 2016-08-10 DIAGNOSIS — Z950 Presence of cardiac pacemaker: Secondary | ICD-10-CM | POA: Diagnosis not present

## 2016-08-10 DIAGNOSIS — D509 Iron deficiency anemia, unspecified: Secondary | ICD-10-CM | POA: Diagnosis not present

## 2016-08-10 DIAGNOSIS — E1122 Type 2 diabetes mellitus with diabetic chronic kidney disease: Secondary | ICD-10-CM | POA: Diagnosis not present

## 2016-08-10 DIAGNOSIS — N182 Chronic kidney disease, stage 2 (mild): Secondary | ICD-10-CM

## 2016-08-10 DIAGNOSIS — R809 Proteinuria, unspecified: Secondary | ICD-10-CM

## 2016-08-10 DIAGNOSIS — R7989 Other specified abnormal findings of blood chemistry: Secondary | ICD-10-CM

## 2016-08-10 DIAGNOSIS — E785 Hyperlipidemia, unspecified: Secondary | ICD-10-CM | POA: Diagnosis not present

## 2016-08-10 DIAGNOSIS — I480 Paroxysmal atrial fibrillation: Secondary | ICD-10-CM

## 2016-08-10 DIAGNOSIS — I10 Essential (primary) hypertension: Secondary | ICD-10-CM

## 2016-08-10 LAB — POCT GLYCOSYLATED HEMOGLOBIN (HGB A1C): Hemoglobin A1C: 6.5

## 2016-08-10 LAB — POCT UA - MICROALBUMIN: MICROALBUMIN (UR) POC: 20 mg/L

## 2016-08-10 MED ORDER — CYANOCOBALAMIN 1000 MCG/ML IJ SOLN
1000.0000 ug | Freq: Once | INTRAMUSCULAR | Status: AC
  Administered 2016-08-10: 1000 ug via INTRAMUSCULAR

## 2016-08-10 MED ORDER — ROSUVASTATIN CALCIUM 5 MG PO TABS: 5.0000 mg | ORAL_TABLET | Freq: Every day | ORAL | 4 refills | Status: DC

## 2016-08-10 MED ORDER — HYDROCHLOROTHIAZIDE 25 MG PO TABS: 25.0000 mg | ORAL_TABLET | Freq: Every day | ORAL | 1 refills | Status: DC

## 2016-08-10 MED ORDER — AMLODIPINE BESYLATE-VALSARTAN 5-320 MG PO TABS: 1.0000 | ORAL_TABLET | Freq: Every day | ORAL | 1 refills | Status: DC

## 2016-08-10 NOTE — Progress Notes (Signed)
Name: Emma Campbell   MRN: [id number]    DOB: [birth year]   Date:08/10/2016       Progress Note  Subjective  Chief Complaint  Chief Complaint  Patient presents with  . Diabetes    4 month follow up  . Hyperlipidemia  . Hypertension    HPI  DMII with renal manifestation: CKI and microalbuminuria. HgbA1C has been normal, she denies hypoglycemia episodes. She feels well. Glucose at home is getting checked a couple of times weekly and is running around 120's She is complaint with her diet. She denies polyphagia, polydipsia or polyuria. Eye exam is scheduled for Feb 19th, 2018. Continue ARB for kidney protection, urine micro back to normal.   HTN: taking medication and denies side effects of medication, bp at home is around 130's.No orthostatic changes , no chest pain or SOB.  Hyperlipidemia: taking Crestor5 mg and denies myalgias. Labs done in 11/2015 were at goal and reviewed with patient.  Sick Sinus Syndrome: she had a pace maker placed in 2006 and is taking Amiodarone since 2006, she is now down to 100 mg of Amiodarone daily, last TSH was back to normal ,  no chest pain or palpitation, no syncope. Sees Dr. Clayborn Bigness , she is [name] score of 2, and is on aspirin only without problems.   Vitamin D deficiency: still taking supplementation, denies fatigue  Osteopenia: used to take Fosamax but has been off and last bone density reviewed with patient, low FRAX score, continue high calcium diet and vitamin D supplementation   Anemia: iron deficiency and positive hemoccult, seen by Dr. Durwin Reges and will have EGD and colonoscopy September 2018  Patient Active Problem List   Diagnosis Date Noted  . Type 2 diabetes mellitus with stage 2 chronic kidney disease, without long-term current use of insulin ([place]) 08/03/2015  . Arthritis, degenerative 05/03/2015  . Menopausal and perimenopausal disorder 05/03/2015  . Chronic kidney disease (CKD), stage III (moderate) 11/30/2014  . High risk medication  use 11/30/2014  . Diverticulosis 11/30/2014  . Fibrocystic breast disease 11/30/2014  . Elevated LFTs 11/30/2014  . Systolic ejection murmur 09/98/3382  . Paroxysmal A-fib ([place]) 11/30/2014  . Microalbuminuria 07/31/2014  . Elevated TSH 07/31/2014  . History of cardiac pacemaker 07/31/2014  . Sick sinus syndrome ([place]) 08/13/2013  . Hypercholesteremia 08/13/2013  . Hypertension, benign 08/13/2013  . Diabetes mellitus with renal manifestations, controlled ([place]) 08/13/2013  . Avitaminosis D 04/12/2009    Past Surgical History:  Procedure Laterality Date  . PACEMAKER INSERTION  2006    Family History  Problem Relation Age of Onset  . Hypertension Mother   . Stroke Mother   . Diabetes Daughter   . Stroke Father   . Hypertension Maternal Grandmother     Social History   Social History  . Marital status: Widowed    Spouse name: N/A  . Number of children: 1  . Years of education: 39   Occupational History  . Retired [employer] at [place] Topics  . Smoking status: Never Smoker  . Smokeless tobacco: Never Used  . Alcohol use No  . Drug use: No  . Sexual activity: No   Other Topics Concern  . Not on file   Social History Narrative  . No narrative on file     Current Outpatient Prescriptions:  .  amiodarone (PACERONE) 200 MG tablet, Take 100 mg by mouth daily., Disp: , Rfl:  .  amLODipine-valsartan (EXFORGE) 5-320 MG  tablet, Take 1 tablet by mouth daily., Disp: 90 tablet, Rfl: 1 .  aspirin 81 MG tablet, Take 81 mg by mouth daily., Disp: , Rfl:  .  cholecalciferol (VITAMIN D) 1000 UNITS tablet, Take 200 Units by mouth daily., Disp: , Rfl:  .  folic acid (FOLVITE) 1 MG tablet, Take 1 mg by mouth daily., Disp: , Rfl:  .  hydrochlorothiazide (HYDRODIURIL) 25 MG tablet, Take 1 tablet (25 mg total) by mouth daily., Disp: 90 tablet, Rfl: 1 .  metFORMIN (GLUCOPHAGE-XR) 750 MG 24 hr tablet, TAKE 1 TABLET EVERY EVENING, Disp: 90 tablet, Rfl: 1 .   rosuvastatin (CRESTOR) 5 MG tablet, Take 1 tablet (5 mg total) by mouth daily., Disp: 90 tablet, Rfl: 4  Allergies  Allergen Reactions  . Ace Inhibitors      ROS  Constitutional: Negative for fever or weight change.  Respiratory: Negative for cough and shortness of breath.   Cardiovascular: Negative for chest pain or palpitations.  Gastrointestinal: Negative for abdominal pain, no bowel changes.  Musculoskeletal: Negative for gait problem or joint swelling.  Skin: Negative for rash.  Neurological: Negative for dizziness or headache.  No other specific complaints in a complete review of systems (except as listed in HPI above).  Objective  Vitals:   08/10/16 0941  BP: 124/64  Pulse: 87  Resp: 16  Temp: 97.5 F (36.4 C)  SpO2: 97%  Weight: 129 lb 1 oz (58.5 kg)  Height: 5\' 1"  (1.549 m)    Body mass index is 24.39 kg/m.  Physical Exam  Constitutional: Patient appears well-developed and well-nourished. Obese  No distress.  HEENT: head atraumatic, normocephalic, pupils equal and reactive to light, neck supple, throat within normal limits Cardiovascular: Normal rate, regular rhythm and normal heart sounds.  No murmur heard. No BLE edema. Pulmonary/Chest: Effort normal and breath sounds normal. No respiratory distress. Abdominal: Soft.  There is no tenderness. Psychiatric: Patient has a normal mood and affect. behavior is normal. Judgment and thought content normal.  Recent Results (from the past 2160 hour(s))  POC Hemoccult Bld/Stl (3-Cd Home Screen)     Status: Abnormal   Collection Time: 06/30/16  2:58 PM  Result Value Ref Range   Card #1 Date 06/26/2016    Fecal Occult Blood, POC Positive (A) Negative   Card #2 Date 06/28/2016    Card #2 Fecal Occult Blod, POC Negative    Card #3 Date 06/29/2016    Card #3 Fecal Occult Blood, POC Negative   POCT UA - Microalbumin     Status: Normal   Collection Time: 08/10/16  9:47 AM  Result Value Ref Range   Microalbumin Ur,  POC 20 mg/L   Creatinine, POC  mg/dL   Albumin/Creatinine Ratio, Urine, POC    POCT HgB A1C     Status: Normal   Collection Time: 08/10/16  9:48 AM  Result Value Ref Range   Hemoglobin A1C 6.5     PHQ2/9: Depression screen [facility] 2/9 06/13/2016 04/11/2016 01/25/2016 12/09/2015 11/26/2015  Decreased Interest 0 0 0 0 0  Down, Depressed, Hopeless 0 0 0 0 0  PHQ - 2 Score 0 0 0 0 0    Fall Risk: Fall Risk  06/13/2016 04/11/2016 01/25/2016 12/09/2015 11/26/2015  Falls in the past year? No No No No No     Assessment & Plan  1. Type 2 diabetes mellitus with stage 2 chronic kidney disease, without long-term current use of insulin (HCC)  - POCT HgB A1C - POCT UA -  Microalbumin - amLODipine-valsartan (EXFORGE) 5-320 MG tablet; Take 1 tablet by mouth daily.  Dispense: 90 tablet; Refill: 1  2. B12 deficiency  -B12 injection today   3. Dyslipidemia  - rosuvastatin (CRESTOR) 5 MG tablet; Take 1 tablet (5 mg total) by mouth daily.  Dispense: 90 tablet; Refill: 4  4. Hypertension, benign  - hydrochlorothiazide (HYDRODIURIL) 25 MG tablet; Take 1 tablet (25 mg total) by mouth daily.  Dispense: 90 tablet; Refill: 1 - amLODipine-valsartan (EXFORGE) 5-320 MG tablet; Take 1 tablet by mouth daily.  Dispense: 90 tablet; Refill: 1  5. History of cardiac pacemaker  No problems  6. Osteopenia, unspecified location  Discussed results with patient today   7. Iron deficiency anemia, unspecified iron deficiency anemia type  Seen by Dr. Durwin Reges, positive hemoccult stool, colonoscopy and EGD in 10/2016  8. Paroxysmal A-fib (HCC)  CHAD2 score, but not on anti-coagulation, only aspirin, sees cardiologist, likely because of her age and risk of bleeding.  9. Abnormal TSH  Normalized with adjustment of Amiodarone dose  10. Type 2 diabetes mellitus with microalbuminuria, without long-term current use of insulin (HCC)  - amLODipine-valsartan (EXFORGE) 5-320 MG tablet; Take 1 tablet by mouth daily.   Dispense: 90 tablet; Refill: 1

## 2016-08-17 DIAGNOSIS — I480 Paroxysmal atrial fibrillation: Secondary | ICD-10-CM | POA: Diagnosis not present

## 2016-08-17 DIAGNOSIS — M1990 Unspecified osteoarthritis, unspecified site: Secondary | ICD-10-CM | POA: Diagnosis not present

## 2016-08-17 DIAGNOSIS — I10 Essential (primary) hypertension: Secondary | ICD-10-CM | POA: Diagnosis not present

## 2016-08-17 DIAGNOSIS — E785 Hyperlipidemia, unspecified: Secondary | ICD-10-CM | POA: Diagnosis not present

## 2016-08-17 DIAGNOSIS — I495 Sick sinus syndrome: Secondary | ICD-10-CM | POA: Diagnosis not present

## 2016-08-17 DIAGNOSIS — R011 Cardiac murmur, unspecified: Secondary | ICD-10-CM | POA: Diagnosis not present

## 2016-08-17 DIAGNOSIS — R001 Bradycardia, unspecified: Secondary | ICD-10-CM | POA: Diagnosis not present

## 2016-10-04 ENCOUNTER — Telehealth: Payer: Self-pay | Admitting: Gastroenterology

## 2016-10-04 NOTE — Telephone Encounter (Signed)
Patient left a voice message that she wants to speak to you regarding her colonoscopy on 9/10. Please call

## 2016-10-06 NOTE — Telephone Encounter (Signed)
Pt was going to cancel procedure due to cost issue but has decided to move forward with procedure.

## 2016-10-16 ENCOUNTER — Telehealth: Payer: Self-pay

## 2016-10-16 DIAGNOSIS — E7800 Pure hypercholesterolemia, unspecified: Secondary | ICD-10-CM | POA: Diagnosis not present

## 2016-10-16 DIAGNOSIS — S80212A Abrasion, left knee, initial encounter: Secondary | ICD-10-CM | POA: Diagnosis not present

## 2016-10-16 DIAGNOSIS — I4891 Unspecified atrial fibrillation: Secondary | ICD-10-CM | POA: Diagnosis not present

## 2016-10-16 DIAGNOSIS — M47812 Spondylosis without myelopathy or radiculopathy, cervical region: Secondary | ICD-10-CM | POA: Diagnosis not present

## 2016-10-16 DIAGNOSIS — M5030 Other cervical disc degeneration, unspecified cervical region: Secondary | ICD-10-CM | POA: Diagnosis not present

## 2016-10-16 DIAGNOSIS — L0889 Other specified local infections of the skin and subcutaneous tissue: Secondary | ICD-10-CM | POA: Diagnosis not present

## 2016-10-16 DIAGNOSIS — S199XXA Unspecified injury of neck, initial encounter: Secondary | ICD-10-CM | POA: Diagnosis not present

## 2016-10-16 DIAGNOSIS — S00511A Abrasion of lip, initial encounter: Secondary | ICD-10-CM | POA: Diagnosis not present

## 2016-10-16 DIAGNOSIS — I10 Essential (primary) hypertension: Secondary | ICD-10-CM | POA: Diagnosis not present

## 2016-10-16 DIAGNOSIS — E119 Type 2 diabetes mellitus without complications: Secondary | ICD-10-CM | POA: Diagnosis not present

## 2016-10-16 DIAGNOSIS — S60511A Abrasion of right hand, initial encounter: Secondary | ICD-10-CM | POA: Diagnosis not present

## 2016-10-16 DIAGNOSIS — S80211A Abrasion, right knee, initial encounter: Secondary | ICD-10-CM | POA: Diagnosis not present

## 2016-10-16 DIAGNOSIS — R51 Headache: Secondary | ICD-10-CM | POA: Diagnosis not present

## 2016-10-16 NOTE — Telephone Encounter (Signed)
Dr. Melina Fiddler from [facility] called and stated that pt was there seeing her due to fall pt had a week and a half ago. Dr. Melina Fiddler stated that labs were negative and so was imaging and CT scan but pt does have some crusting over an abrasion received during fall so she placed her on keflex and that she would like for pt to follow up with you due to age and fall.

## 2016-10-16 NOTE — Telephone Encounter (Signed)
Please schedule a hospital follow up  Please contact patient and open and phone conversation about her hospital visit. Current medications , and her follow up date with me.  Thank you

## 2016-10-17 NOTE — Telephone Encounter (Signed)
Pt was placed on an antibiotic and has made appointment for follow up

## 2016-10-19 ENCOUNTER — Encounter: Payer: Self-pay | Admitting: Family Medicine

## 2016-10-19 ENCOUNTER — Ambulatory Visit (INDEPENDENT_AMBULATORY_CARE_PROVIDER_SITE_OTHER): Payer: Medicare HMO | Admitting: Family Medicine

## 2016-10-19 VITALS — BP 120/68 | HR 84 | Temp 98.1°F | Resp 16 | Ht 61.0 in | Wt 133.5 lb

## 2016-10-19 DIAGNOSIS — Z950 Presence of cardiac pacemaker: Secondary | ICD-10-CM | POA: Diagnosis not present

## 2016-10-19 DIAGNOSIS — S025XXA Fracture of tooth (traumatic), initial encounter for closed fracture: Secondary | ICD-10-CM | POA: Diagnosis not present

## 2016-10-19 DIAGNOSIS — I480 Paroxysmal atrial fibrillation: Secondary | ICD-10-CM

## 2016-10-19 DIAGNOSIS — Z9181 History of falling: Secondary | ICD-10-CM | POA: Diagnosis not present

## 2016-10-19 DIAGNOSIS — S0081XD Abrasion of other part of head, subsequent encounter: Secondary | ICD-10-CM | POA: Diagnosis not present

## 2016-10-19 NOTE — Patient Instructions (Signed)
Fall Prevention in the Home Falls can cause injuries and can affect people from all age groups. There are many simple things that you can do to make your home safe and to help prevent falls. What can I do on the outside of my home?  Regularly repair the edges of walkways and driveways and fix any cracks.  Remove high doorway thresholds.  Trim any shrubbery on the main path into your home.  Use bright outdoor lighting.  Clear walkways of debris and clutter, including tools and rocks.  Regularly check that handrails are securely fastened and in good repair. Both sides of any steps should have handrails.  Install guardrails along the edges of any raised decks or porches.  Have leaves, snow, and ice cleared regularly.  Use sand or salt on walkways during winter months.  In the garage, clean up any spills right away, including grease or oil spills. What can I do in the bathroom?  Use night lights.  Install grab bars by the toilet and in the tub and shower. Do not use towel bars as grab bars.  Use non-skid mats or decals on the floor of the tub or shower.  If you need to sit down while you are in the shower, use a plastic, non-slip stool.  Keep the floor dry. Immediately clean up any water that spills on the floor.  Remove soap buildup in the tub or shower on a regular basis.  Attach bath mats securely with double-sided non-slip rug tape.  Remove throw rugs and other tripping hazards from the floor. What can I do in the bedroom?  Use night lights.  Make sure that a bedside light is easy to reach.  Do not use oversized bedding that drapes onto the floor.  Have a firm chair that has side arms to use for getting dressed.  Remove throw rugs and other tripping hazards from the floor. What can I do in the kitchen?  Clean up any spills right away.  Avoid walking on wet floors.  Place frequently used items in easy-to-reach places.  If you need to reach for something above  you, use a sturdy step stool that has a grab bar.  Keep electrical cables out of the way.  Do not use floor polish or wax that makes floors slippery. If you have to use wax, make sure that it is non-skid floor wax.  Remove throw rugs and other tripping hazards from the floor. What can I do in the stairways?  Do not leave any items on the stairs.  Make sure that there are handrails on both sides of the stairs. Fix handrails that are broken or loose. Make sure that handrails are as long as the stairways.  Check any carpeting to make sure that it is firmly attached to the stairs. Fix any carpet that is loose or worn.  Avoid having throw rugs at the top or bottom of stairways, or secure the rugs with carpet tape to prevent them from moving.  Make sure that you have a light switch at the top of the stairs and the bottom of the stairs. If you do not have them, have them installed. What are some other fall prevention tips?  Wear closed-toe shoes that fit well and support your feet. Wear shoes that have rubber soles or low heels.  When you use a stepladder, make sure that it is completely opened and that the sides are firmly locked. Have someone hold the ladder while you are using   it. Do not climb a closed stepladder.  Add color or contrast paint or tape to grab bars and handrails in your home. Place contrasting color strips on the first and last steps.  Use mobility aids as needed, such as canes, walkers, scooters, and crutches.  Turn on lights if it is dark. Replace any light bulbs that burn out.  Set up furniture so that there are clear paths. Keep the furniture in the same spot.  Fix any uneven floor surfaces.  Choose a carpet design that does not hide the edge of steps of a stairway.  Be aware of any and all pets.  Review your medicines with your healthcare provider. Some medicines can cause dizziness or changes in blood pressure, which increase your risk of falling. Talk with  your health care provider about other ways that you can decrease your risk of falls. This may include working with a physical therapist or trainer to improve your strength, balance, and endurance. This information is not intended to replace advice given to you by your health care provider. Make sure you discuss any questions you have with your health care provider. Document Released: 02/03/2002 Document Revised: 07/13/2015 Document Reviewed: 03/20/2014 Elsevier Interactive Patient Education  2017 Elsevier Inc.  

## 2016-10-19 NOTE — Progress Notes (Signed)
Name: Emma Campbell   MRN: [id number]    DOB: [birth year]   Date:10/19/2016       Progress Note  Subjective  Chief Complaint  Chief Complaint  Patient presents with  . Hospitalization Follow-up    follow up for a fall pt got caught up in a dog leash and fell pt has abrasion to her face but no other injuries        HPI  She states she tripped on dog's leash ( he was in a chain) and fell on her knees , arms and face on 10/06/2016. She states she did not lose consciousness, denies preceding nausea, chest pain or palpitation, defibrillator did not go off. She had difficulty getting up on her own, so she called her daughter , she help her get up and she was able to walk after she stood up. She broke two front teeth and a few days after she fell she developed a mild headache and went to [facility], CT brain and c-spine were negative. She is taking Keflex for facial abrasion, tetanus given at [facility]. Still has some facial swelling and crusting of scab, no longer has headache, other areas already healed. She is feeling well, just came in because was advised to come by [facility]. Spent time reviewing records from [facility]  Patient Active Problem List   Diagnosis Date Noted  . Type 2 diabetes mellitus with stage 2 chronic kidney disease, without long-term current use of insulin ([place]) 08/03/2015  . Arthritis, degenerative 05/03/2015  . Menopausal and perimenopausal disorder 05/03/2015  . Chronic kidney disease (CKD), stage III (moderate) 11/30/2014  . High risk medication use 11/30/2014  . Diverticulosis 11/30/2014  . Fibrocystic breast disease 11/30/2014  . Elevated LFTs 11/30/2014  . Systolic ejection murmur 45/62/5638  . Paroxysmal A-fib ([place]) 11/30/2014  . Microalbuminuria 07/31/2014  . Elevated TSH 07/31/2014  . History of cardiac pacemaker 07/31/2014  . Sick sinus syndrome ([place]) 08/13/2013  . Hypercholesteremia 08/13/2013  . Hypertension, benign 08/13/2013  . Diabetes mellitus with renal manifestations, controlled  ([place]) 08/13/2013  . Avitaminosis D 04/12/2009    Past Surgical History:  Procedure Laterality Date  . PACEMAKER INSERTION  2006    Family History  Problem Relation Age of Onset  . Hypertension Mother   . Stroke Mother   . Diabetes Daughter   . Stroke Father   . Hypertension Maternal Grandmother     Social History   Social History  . Marital status: Widowed    Spouse name: N/A  . Number of children: 1  . Years of education: 75   Occupational History  . Retired [employer] at [place] Topics  . Smoking status: Never Smoker  . Smokeless tobacco: Never Used  . Alcohol use No  . Drug use: No  . Sexual activity: No   Other Topics Concern  . Not on file   Social History Narrative  . No narrative on file     Current Outpatient Prescriptions:  .  amiodarone (PACERONE) 200 MG tablet, Take 100 mg by mouth daily., Disp: , Rfl:  .  amLODipine-valsartan (EXFORGE) 5-320 MG tablet, Take 1 tablet by mouth daily., Disp: 90 tablet, Rfl: 1 .  aspirin 81 MG tablet, Take 81 mg by mouth daily., Disp: , Rfl:  .  cephALEXin (KEFLEX) 500 MG capsule, Take 500 mg by mouth., Disp: , Rfl:  .  cholecalciferol (VITAMIN D) 1000 UNITS tablet, Take 200 Units by mouth daily., Disp: ,  Rfl:  .  folic acid (FOLVITE) 1 MG tablet, Take 1 mg by mouth daily., Disp: , Rfl:  .  hydrochlorothiazide (HYDRODIURIL) 25 MG tablet, Take 1 tablet (25 mg total) by mouth daily., Disp: 90 tablet, Rfl: 1 .  metFORMIN (GLUCOPHAGE-XR) 750 MG 24 hr tablet, TAKE 1 TABLET EVERY EVENING, Disp: 90 tablet, Rfl: 1 .  rosuvastatin (CRESTOR) 5 MG tablet, Take 1 tablet (5 mg total) by mouth daily., Disp: 90 tablet, Rfl: 4  Allergies  Allergen Reactions  . Ace Inhibitors      ROS  Constitutional: Negative for fever or weight change.  Respiratory: Negative for cough and shortness of breath.   Cardiovascular: Negative for chest pain or palpitations.  Gastrointestinal: Negative for abdominal  pain, no bowel changes.  Musculoskeletal: Negative for gait problem or joint swelling.  Skin: Negative for rash.  Neurological: Negative for dizziness or headache.  No other specific complaints in a complete review of systems (except as listed in HPI above).  Objective  Vitals:   10/19/16 1005  BP: 120/68  Pulse: 84  Resp: 16  Temp: 98.1 F (36.7 C)  SpO2: 90%  Weight: 133 lb 8 oz (60.6 kg)  Height: 5\' 1"  (1.549 m)    Body mass index is 25.22 kg/m.  Physical Exam  Constitutional: Patient appears well-developed and well-nourished.  No distress.  HEENT: head atraumatic, normocephalic, pupils equal and reactive to light, neck supple, throat within normal limits Cardiovascular: Normal rate, regular rhythm and normal heart sounds.  No murmur heard. No BLE edema. Pulmonary/Chest: Effort normal and breath sounds normal. No respiratory distress. Abdominal: Soft.  There is no tenderness. Psychiatric: Patient has a normal mood and affect. behavior is normal. Judgment and thought content normal. Neurological: no focal findings.  Skin: abrasion of upper lip and tip of the nose  Recent Results (from the past 2160 hour(s))  POCT UA - Microalbumin     Status: Normal   Collection Time: 08/10/16  9:47 AM  Result Value Ref Range   Microalbumin Ur, POC 20 mg/L   Creatinine, POC  mg/dL   Albumin/Creatinine Ratio, Urine, POC    POCT HgB A1C     Status: Normal   Collection Time: 08/10/16  9:48 AM  Result Value Ref Range   Hemoglobin A1C 6.5     PHQ2/9: Depression screen [facility] 2/9 10/19/2016 06/13/2016 04/11/2016 01/25/2016 12/09/2015  Decreased Interest 0 0 0 0 0  Down, Depressed, Hopeless 0 0 0 0 0  PHQ - 2 Score 0 0 0 0 0     Fall Risk: Fall Risk  10/19/2016 06/13/2016 04/11/2016 01/25/2016 12/09/2015  Falls in the past year? Yes No No No No  Number falls in past yr: 1 - - - -  Injury with Fall? Yes - - - -  Follow up Falls evaluation completed - - - -      Assessment &  Plan  1. History of recent fall  - refer PT It does not seem to be secondary to heart or neurological deficit, however advised to move the chain to further away from the house, also to look around her house for other fall hazards. We will refer her to PT. No further evaluation at this time  2. Abrasion of face, subsequent encounter  Continue antibiotics, healing, crusting over  3. Broken tooth, complicated, closed, initial encounter  She has an appointment with dentist.  Chipped one superior and one inferior  4. Paroxysmal A-fib [facility])  Doing well , it  does not seem to be the cause of her symptoms  5. History of cardiac pacemaker  Doing well, sees cardiologist, and did not go off during her fall

## 2016-10-27 NOTE — Anesthesia Preprocedure Evaluation (Addendum)
Anesthesia Evaluation  Patient identified by MRN, date of birth, ID band Patient awake    Reviewed: Allergy & Precautions, H&P , NPO status , Patient's Chart, lab work & pertinent test results, Unable to perform ROS - Chart review only  History of Anesthesia Complications Negative for: history of anesthetic complications  Airway Mallampati: II  TM Distance: >3 FB     Dental  (+) Loose Two front teeth loose from a fall:   Pulmonary neg pulmonary ROS,    Pulmonary exam normal        Cardiovascular hypertension, Normal cardiovascular exam+ dysrhythmias (A fib; SSS s/p pacemaker) + pacemaker + Valvular Problems/Murmurs (heart murmur)   ECG 10/16/16: NORMAL SINUS RHYTHM NORMAL ECG   Neuro/Psych    GI/Hepatic GERD  ,  Endo/Other  diabetes  Renal/GU      Musculoskeletal  (+) Arthritis ,   Abdominal   Peds  Hematology   Anesthesia Other Findings From cardiology visit 08/17/16:  Assessment   83 y.o. female with  1. Sick sinus syndrome (CMS-HCC)  2. Paroxysmal atrial fibrillation (CMS-HCC)  3. SSS (sick sinus syndrome) (CMS-HCC)  4. Bradycardia  5. Hyperlipidemia, unspecified hyperlipidemia type  6. Essential hypertension  7. Osteoarthritis, unspecified osteoarthritis type, unspecified site  8. Murmur, unspecified   Plan  1 sick sinus syndrome chronic stable status post permanent pacemaker no further recurrent symptoms continue current therapy 2 bradycardia symptomatic with syncope in the past now with permanent pacemaker the patient follow-up with pacer clinic 3 hyperlipidemia chronic stable currently on Crestor therapy for lipid management 4 diabetes type 2 uncomplicated continue metformin therapy for diabetes management 5 hypertension controlled on amlodipine valsartan HCTZ 6 atrial fibrillation paroxysmal continue amiodarone for rhythm control currently not on anticoagulation patient unable to maintain sinus  rhythm 7 murmur systolic last echo was 49/20/1007 will consider repeat for assessment of valvular structures 8 have the patient follow-up in 6 months  Return in about 6 months (around 02/16/2017).  DWAYNE Prince [place], MD  Reproductive/Obstetrics negative OB ROS                           Anesthesia Physical Anesthesia Plan  ASA: III  Anesthesia Plan: General   Post-op Pain Management:    Induction: Intravenous  PONV Risk Score and Plan: 2 and Propofol infusion  Airway Management Planned: Natural Airway  Additional Equipment:   Intra-op Plan:   Post-operative Plan:   Informed Consent:   Plan Discussed with: CRNA  Anesthesia Plan Comments:        Anesthesia Quick Evaluation

## 2016-11-03 NOTE — Discharge Instructions (Signed)
General Anesthesia, Adult, Care After °These instructions provide you with information about caring for yourself after your procedure. Your health care provider may also give you more specific instructions. Your treatment has been planned according to current medical practices, but problems sometimes occur. Call your health care provider if you have any problems or questions after your procedure. °What can I expect after the procedure? °After the procedure, it is common to have: °· Vomiting. °· A sore throat. °· Mental slowness. ° °It is common to feel: °· Nauseous. °· Cold or shivery. °· Sleepy. °· Tired. °· Sore or achy, even in parts of your body where you did not have surgery. ° °Follow these instructions at home: °For at least 24 hours after the procedure: °· Do not: °? Participate in activities where you could fall or become injured. °? Drive. °? Use heavy machinery. °? Drink alcohol. °? Take sleeping pills or medicines that cause drowsiness. °? Make important decisions or sign legal documents. °? Take care of children on your own. °· Rest. °Eating and drinking °· If you vomit, drink water, juice, or soup when you can drink without vomiting. °· Drink enough fluid to keep your urine clear or pale yellow. °· Make sure you have little or no nausea before eating solid foods. °· Follow the diet recommended by your health care provider. °General instructions °· Have a responsible adult stay with you until you are awake and alert. °· Return to your normal activities as told by your health care provider. Ask your health care provider what activities are safe for you. °· Take over-the-counter and prescription medicines only as told by your health care provider. °· If you smoke, do not smoke without supervision. °· Keep all follow-up visits as told by your health care provider. This is important. °Contact a health care provider if: °· You continue to have nausea or vomiting at home, and medicines are not helpful. °· You  cannot drink fluids or start eating again. °· You cannot urinate after 8-12 hours. °· You develop a skin rash. °· You have fever. °· You have increasing redness at the site of your procedure. °Get help right away if: °· You have difficulty breathing. °· You have chest pain. °· You have unexpected bleeding. °· You feel that you are having a life-threatening or urgent problem. °This information is not intended to replace advice given to you by your health care provider. Make sure you discuss any questions you have with your health care provider. °Document Released: 05/22/2000 Document Revised: 07/19/2015 Document Reviewed: 01/28/2015 °Elsevier Interactive Patient Education © 2018 [employer] ° °

## 2016-11-06 ENCOUNTER — Ambulatory Visit: Payer: Medicare HMO | Admitting: Anesthesiology

## 2016-11-06 ENCOUNTER — Ambulatory Visit
Admission: RE | Admit: 2016-11-06 | Discharge: 2016-11-06 | Disposition: A | Discharge status: Home / Self Care | Payer: Medicare HMO | Source: Ambulatory Visit | Attending: Gastroenterology | Admitting: Gastroenterology

## 2016-11-06 ENCOUNTER — Encounter
Admission: RE | Disposition: A | Discharge status: Home / Self Care | Payer: Self-pay | Source: Ambulatory Visit | Attending: Gastroenterology

## 2016-11-06 DIAGNOSIS — N183 Chronic kidney disease, stage 3 (moderate): Secondary | ICD-10-CM | POA: Diagnosis not present

## 2016-11-06 DIAGNOSIS — Z888 Allergy status to other drugs, medicaments and biological substances status: Secondary | ICD-10-CM | POA: Diagnosis not present

## 2016-11-06 DIAGNOSIS — Z7982 Long term (current) use of aspirin: Secondary | ICD-10-CM | POA: Diagnosis not present

## 2016-11-06 DIAGNOSIS — K449 Diaphragmatic hernia without obstruction or gangrene: Secondary | ICD-10-CM | POA: Insufficient documentation

## 2016-11-06 DIAGNOSIS — K635 Polyp of colon: Secondary | ICD-10-CM | POA: Insufficient documentation

## 2016-11-06 DIAGNOSIS — E1122 Type 2 diabetes mellitus with diabetic chronic kidney disease: Secondary | ICD-10-CM | POA: Diagnosis not present

## 2016-11-06 DIAGNOSIS — K641 Second degree hemorrhoids: Secondary | ICD-10-CM | POA: Diagnosis not present

## 2016-11-06 DIAGNOSIS — K5730 Diverticulosis of large intestine without perforation or abscess without bleeding: Secondary | ICD-10-CM | POA: Insufficient documentation

## 2016-11-06 DIAGNOSIS — E559 Vitamin D deficiency, unspecified: Secondary | ICD-10-CM | POA: Diagnosis not present

## 2016-11-06 DIAGNOSIS — D125 Benign neoplasm of sigmoid colon: Secondary | ICD-10-CM | POA: Diagnosis not present

## 2016-11-06 DIAGNOSIS — I129 Hypertensive chronic kidney disease with stage 1 through stage 4 chronic kidney disease, or unspecified chronic kidney disease: Secondary | ICD-10-CM | POA: Insufficient documentation

## 2016-11-06 DIAGNOSIS — E785 Hyperlipidemia, unspecified: Secondary | ICD-10-CM | POA: Diagnosis not present

## 2016-11-06 DIAGNOSIS — D508 Other iron deficiency anemias: Secondary | ICD-10-CM | POA: Diagnosis not present

## 2016-11-06 DIAGNOSIS — Z7984 Long term (current) use of oral hypoglycemic drugs: Secondary | ICD-10-CM | POA: Insufficient documentation

## 2016-11-06 DIAGNOSIS — I480 Paroxysmal atrial fibrillation: Secondary | ICD-10-CM | POA: Insufficient documentation

## 2016-11-06 DIAGNOSIS — R011 Cardiac murmur, unspecified: Secondary | ICD-10-CM | POA: Insufficient documentation

## 2016-11-06 DIAGNOSIS — G909 Disorder of the autonomic nervous system, unspecified: Secondary | ICD-10-CM | POA: Diagnosis not present

## 2016-11-06 DIAGNOSIS — Z950 Presence of cardiac pacemaker: Secondary | ICD-10-CM | POA: Diagnosis not present

## 2016-11-06 DIAGNOSIS — M1990 Unspecified osteoarthritis, unspecified site: Secondary | ICD-10-CM | POA: Insufficient documentation

## 2016-11-06 DIAGNOSIS — M810 Age-related osteoporosis without current pathological fracture: Secondary | ICD-10-CM | POA: Insufficient documentation

## 2016-11-06 DIAGNOSIS — D509 Iron deficiency anemia, unspecified: Secondary | ICD-10-CM | POA: Insufficient documentation

## 2016-11-06 HISTORY — PX: ESOPHAGOGASTRODUODENOSCOPY (EGD) WITH PROPOFOL: SHX5813

## 2016-11-06 HISTORY — PX: POLYPECTOMY: SHX5525

## 2016-11-06 HISTORY — PX: COLONOSCOPY WITH PROPOFOL: SHX5780

## 2016-11-06 LAB — GLUCOSE, CAPILLARY
GLUCOSE-CAPILLARY: 119 mg/dL — AB (ref 65–99)
Glucose-Capillary: 112 mg/dL — ABNORMAL HIGH (ref 65–99)

## 2016-11-06 SURGERY — COLONOSCOPY WITH PROPOFOL
Anesthesia: General | Wound class: Contaminated

## 2016-11-06 MED ORDER — OXYCODONE HCL 5 MG/5ML PO SOLN: 5.0000 mg | Freq: Once | ORAL | Status: DC | PRN

## 2016-11-06 MED ORDER — OXYCODONE HCL 5 MG PO TABS: 5.0000 mg | ORAL_TABLET | Freq: Once | ORAL | Status: DC | PRN

## 2016-11-06 MED ORDER — STERILE WATER FOR IRRIGATION IR SOLN
Status: DC | PRN
  Administered 2016-11-06: 10:00:00

## 2016-11-06 MED ORDER — LIDOCAINE HCL (CARDIAC) 20 MG/ML IV SOLN
INTRAVENOUS | Status: DC | PRN
  Administered 2016-11-06: 50 mg via INTRAVENOUS

## 2016-11-06 MED ORDER — GLYCOPYRROLATE 0.2 MG/ML IJ SOLN
INTRAMUSCULAR | Status: DC | PRN
  Administered 2016-11-06: 0.2 mg via INTRAVENOUS

## 2016-11-06 MED ORDER — PROMETHAZINE HCL 25 MG/ML IJ SOLN: 6.2500 mg | INTRAMUSCULAR | Status: DC | PRN

## 2016-11-06 MED ORDER — FENTANYL CITRATE (PF) 100 MCG/2ML IJ SOLN: 25.0000 ug | INTRAMUSCULAR | Status: DC | PRN

## 2016-11-06 MED ORDER — PROPOFOL 10 MG/ML IV BOLUS
INTRAVENOUS | Status: DC | PRN
  Administered 2016-11-06: 20 mg via INTRAVENOUS
  Administered 2016-11-06: 10 mg via INTRAVENOUS
  Administered 2016-11-06: 20 mg via INTRAVENOUS
  Administered 2016-11-06: 60 mg via INTRAVENOUS
  Administered 2016-11-06 (×3): 20 mg via INTRAVENOUS
  Administered 2016-11-06: 10 mg via INTRAVENOUS
  Administered 2016-11-06: 20 mg via INTRAVENOUS

## 2016-11-06 MED ORDER — SODIUM CHLORIDE 0.9 % IV SOLN: INTRAVENOUS | Status: DC

## 2016-11-06 MED ORDER — MEPERIDINE HCL 25 MG/ML IJ SOLN: 6.2500 mg | INTRAMUSCULAR | Status: DC | PRN

## 2016-11-06 MED ORDER — LACTATED RINGERS IV SOLN
INTRAVENOUS | Status: DC
  Administered 2016-11-06: 10:00:00 via INTRAVENOUS

## 2016-11-06 SURGICAL SUPPLY — 35 items
BALLN DILATOR 10-12 8 (BALLOONS)
BALLN DILATOR 12-15 8 (BALLOONS)
BALLN DILATOR 15-18 8 (BALLOONS)
BALLN DILATOR CRE 0-12 8 (BALLOONS)
BALLN DILATOR ESOPH 8 10 CRE (MISCELLANEOUS) IMPLANT
BALLOON DILATOR 12-15 8 (BALLOONS) IMPLANT
BALLOON DILATOR 15-18 8 (BALLOONS) IMPLANT
BALLOON DILATOR CRE 0-12 8 (BALLOONS) IMPLANT
BLOCK BITE 60FR ADLT L/F GRN (MISCELLANEOUS) ×4 IMPLANT
CANISTER SUCT 1200ML W/VALVE (MISCELLANEOUS) ×4 IMPLANT
CLIP HMST 235XBRD CATH ROT (MISCELLANEOUS) IMPLANT
CLIP RESOLUTION 360 11X235 (MISCELLANEOUS)
FCP ESCP3.2XJMB 240X2.8X (MISCELLANEOUS)
FORCEPS BIOP RAD 4 LRG CAP 4 (CUTTING FORCEPS) ×4 IMPLANT
FORCEPS BIOP RJ4 240 W/NDL (MISCELLANEOUS)
FORCEPS ESCP3.2XJMB 240X2.8X (MISCELLANEOUS) IMPLANT
GOWN CVR UNV OPN BCK APRN NK (MISCELLANEOUS) ×4 IMPLANT
GOWN ISOL THUMB LOOP REG UNIV (MISCELLANEOUS) ×4
INJECTOR VARIJECT VIN23 (MISCELLANEOUS) IMPLANT
KIT DEFENDO VALVE AND CONN (KITS) IMPLANT
KIT ENDO PROCEDURE OLY (KITS) ×4 IMPLANT
MARKER SPOT ENDO TATTOO 5ML (MISCELLANEOUS) IMPLANT
PAD GROUND ADULT SPLIT (MISCELLANEOUS) IMPLANT
PROBE APC STR FIRE (PROBE) IMPLANT
RETRIEVER NET PLAT FOOD (MISCELLANEOUS) IMPLANT
RETRIEVER NET ROTH 2.5X230 LF (MISCELLANEOUS) IMPLANT
SNARE SHORT THROW 13M SML OVAL (MISCELLANEOUS) IMPLANT
SNARE SHORT THROW 30M LRG OVAL (MISCELLANEOUS) IMPLANT
SNARE SNG USE RND 15MM (INSTRUMENTS) IMPLANT
SPOT EX ENDOSCOPIC TATTOO (MISCELLANEOUS)
SYR INFLATION 60ML (SYRINGE) IMPLANT
TRAP ETRAP POLY (MISCELLANEOUS) IMPLANT
VARIJECT INJECTOR VIN23 (MISCELLANEOUS)
WATER STERILE IRR 250ML POUR (IV SOLUTION) ×4 IMPLANT
WIRE CRE 18-20MM 8CM F G (MISCELLANEOUS) IMPLANT

## 2016-11-06 NOTE — H&P (Signed)
Lucilla Lame, MD [address] [place], [place] [address] Phone:[phone] Fax : [phone]  Primary Care Physician:  Steele Sizer, MD Primary Gastroenterologist:  Dr. Allen Norris  Pre-Procedure History & Physical: HPI:  Emma Campbell is a 83 y.o. female is here for an endoscopy and colonoscopy.   Past Medical History:  Diagnosis Date  . Chronic kidney disease, stage III (moderate)   . Diverticulosis   . Elevated LFTs   . Essential hypertension, malignant   . Fibrocystic breast disease   . Heart murmur   . Hyperlipidemia   . Menopausal problem   . Osteoarthrosis   . Osteoporosis   . Peripheral autonomic neuropathy   . Sick sinus syndrome (HCC)    Dr. Alfredo Batty  . Type II diabetes mellitus with renal manifestations ([place])   . Vitamin D deficiency     Past Surgical History:  Procedure Laterality Date  . PACEMAKER INSERTION  2006    Prior to Admission medications   Medication Sig Start Date End Date Taking? Authorizing Provider  amiodarone (PACERONE) 200 MG tablet Take 100 mg by mouth daily.   Yes Callwood, Dwayne D, MD  amLODipine-valsartan (EXFORGE) 5-320 MG tablet Take 1 tablet by mouth daily. 08/10/16  Yes Steele Sizer, MD  aspirin 81 MG tablet Take 81 mg by mouth daily.   Yes [provider]  cholecalciferol (VITAMIN D) 1000 UNITS tablet Take 200 Units by mouth daily.   Yes [provider]  folic acid (FOLVITE) 1 MG tablet Take 1 mg by mouth daily.   Yes [provider]  metFORMIN (GLUCOPHAGE-XR) 750 MG 24 hr tablet TAKE 1 TABLET EVERY EVENING 07/26/16  Yes Hubbard Hartshorn, FNP  rosuvastatin (CRESTOR) 5 MG tablet Take 1 tablet (5 mg total) by mouth daily. 08/10/16  Yes Sowles, Drue Stager, MD  hydrochlorothiazide (HYDRODIURIL) 25 MG tablet Take 1 tablet (25 mg total) by mouth daily. 08/10/16   Steele Sizer, MD    Allergies as of 08/03/2016 - Review Complete 08/03/2016  Allergen Reaction Noted  . Ace inhibitors   05/03/2015    Family History  Problem Relation Age of Onset  . Hypertension Mother   . Stroke Mother   . Diabetes Daughter   . Stroke Father   . Hypertension Maternal Grandmother     Social History   Social History  . Marital status: Widowed    Spouse name: N/A  . Number of children: 1  . Years of education: 4   Occupational History  . Retired [employer] at [employer]  . Smoking status: Never Smoker  . Smokeless tobacco: Never Used  . Alcohol use No  . Drug use: No  . Sexual activity: No   Other Topics Concern  . Not on file   Social History Narrative  . No narrative on file    Review of Systems: See HPI, otherwise negative ROS  Physical Exam: BP (!) 140/57   Pulse 65   Temp 97.7 F (36.5 C) (Tympanic)   Resp 16   Ht 5\' 1"  (1.549 m)   Wt 130 lb (59 kg)   SpO2 97%   BMI 24.56 kg/m  General:   Alert,  pleasant and cooperative in NAD Head:  Normocephalic and atraumatic. Neck:  Supple; no masses or thyromegaly. Lungs:  Clear throughout to auscultation.    Heart:  Regular rate and rhythm. Abdomen:  Soft, nontender and nondistended. Normal bowel sounds, without guarding, and without rebound.  Neurologic:  Alert and  oriented x4;  grossly normal neurologically.  Impression/Plan: [name] is here for an endoscopy and colonoscopy to be performed for IDA  Risks, benefits, limitations, and alternatives regarding  endoscopy and colonoscopy have been reviewed with the patient.  Questions have been answered.  All parties agreeable.   Lucilla Lame, MD  11/06/2016, 9:35 AM

## 2016-11-06 NOTE — Op Note (Signed)
[facility] Gastroenterology Patient Name: Emma Campbell Procedure Date: 11/06/2016 9:44 AM MRN: [id number] Account #: [id number] Date of Birth: 26-Sep-1932 Admit Type: Outpatient Age: 83 Room: [facility] OR ROOM 01 Gender: Female Note Status: Finalized Procedure:            Colonoscopy Indications:          Iron deficiency anemia Providers:            Lucilla Lame MD, MD Medicines:            Propofol per Anesthesia Complications:        No immediate complications. Procedure:            Pre-Anesthesia Assessment:                       - Prior to the procedure, a History and Physical was                        performed, and patient medications and allergies were                        reviewed. The patient's tolerance of previous                        anesthesia was also reviewed. The risks and benefits of                        the procedure and the sedation options and risks were                        discussed with the patient. All questions were                        answered, and informed consent was obtained. Prior                        Anticoagulants: The patient has taken no previous                        anticoagulant or antiplatelet agents. ASA Grade                        Assessment: II - A patient with mild systemic disease.                        After reviewing the risks and benefits, the patient was                        deemed in satisfactory condition to undergo the                        procedure.                       After obtaining informed consent, the colonoscope was                        passed under direct vision. Throughout the procedure,                        the patient's blood pressure, pulse, and  oxygen                        saturations were monitored continuously. The [place] [id number]) was introduced through the                        anus and advanced to the the cecum, identified by                 appendiceal orifice and ileocecal valve. The                        colonoscopy was performed without difficulty. The                        patient tolerated the procedure well. The quality of                        the bowel preparation was excellent. Findings:      The perianal and digital rectal examinations were normal.      A 2 mm polyp was found in the sigmoid colon. The polyp was sessile. The       polyp was removed with a cold biopsy forceps. Resection and retrieval       were complete.      Multiple small-mouthed diverticula were found in the sigmoid colon.      Non-bleeding internal hemorrhoids were found during retroflexion. The       hemorrhoids were Grade II (internal hemorrhoids that prolapse but reduce       spontaneously). Impression:           - One 2 mm polyp in the sigmoid colon, removed with a                        cold biopsy forceps. Resected and retrieved.                       - Diverticulosis in the sigmoid colon.                       - Non-bleeding internal hemorrhoids. Recommendation:       - Discharge patient to home.                       - Resume previous diet.                       - Follow Hb. If continues to drop then consider capsule                        endoscopy. Procedure Code(s):    --- Professional ---                       [phone], Colonoscopy, flexible; with biopsy, single or                        multiple Diagnosis Code(s):    --- Professional ---  D50.9, Iron deficiency anemia, unspecified                       D12.5, Benign neoplasm of sigmoid colon CPT copyright 2016 American Medical Association. All rights reserved. The codes documented in this report are preliminary and upon coder review may  be revised to meet current compliance requirements. Lucilla Lame MD, MD 11/06/2016 10:25:09 AM This report has been signed electronically. Number of Addenda: 0 Note Initiated On: 11/06/2016 9:44 AM Scope  Withdrawal Time: 0 hours 6 minutes 46 seconds  Total Procedure Duration: 0 hours 10 minutes 37 seconds       [facility]

## 2016-11-06 NOTE — Anesthesia Postprocedure Evaluation (Signed)
Anesthesia Post Note  Patient: Emma Campbell  Procedure(s) Performed: Procedure(s) (LRB): COLONOSCOPY WITH PROPOFOL (N/A) ESOPHAGOGASTRODUODENOSCOPY (EGD) WITH PROPOFOL (N/A) POLYPECTOMY  Patient location during evaluation: PACU Anesthesia Type: General Level of consciousness: awake and alert Pain management: pain level controlled Vital Signs Assessment: post-procedure vital signs reviewed and stable Respiratory status: spontaneous breathing, nonlabored ventilation, respiratory function stable and patient connected to nasal cannula oxygen Cardiovascular status: blood pressure returned to baseline and stable Postop Assessment: no signs of nausea or vomiting Anesthetic complications: no    Carol Theys ELAINE

## 2016-11-06 NOTE — Op Note (Signed)
[facility] Gastroenterology Patient Name: Emma Campbell Procedure Date: 11/06/2016 9:44 AM MRN: [id number] Account #: [id number] Date of Birth: March 28, 1932 Admit Type: Outpatient Age: 83 Room: [facility] OR ROOM 01 Gender: Female Note Status: Finalized Procedure:            Upper GI endoscopy Indications:          Iron deficiency anemia Providers:            Lucilla Lame MD, MD Referring MD:         Bethena Roys. Sowles, MD (Referring MD) Medicines:            Propofol per Anesthesia Complications:        No immediate complications. Procedure:            Pre-Anesthesia Assessment:                       - Prior to the procedure, a History and Physical was                        performed, and patient medications and allergies were                        reviewed. The patient's tolerance of previous                        anesthesia was also reviewed. The risks and benefits of                        the procedure and the sedation options and risks were                        discussed with the patient. All questions were                        answered, and informed consent was obtained. Prior                        Anticoagulants: The patient has taken no previous                        anticoagulant or antiplatelet agents. ASA Grade                        Assessment: II - A patient with mild systemic disease.                        After reviewing the risks and benefits, the patient was                        deemed in satisfactory condition to undergo the                        procedure.                       After obtaining informed consent, the endoscope was                        passed under direct vision. Throughout the procedure,  the patient's blood pressure, pulse, and oxygen                        saturations were monitored continuously. The Olympus                        [id number]-[id number] Endoscope (S#. [id number]) was introduced   through the mouth, and advanced to the second part of                        duodenum. The upper GI endoscopy was accomplished                        without difficulty. The patient tolerated the procedure                        well. Findings:      A small hiatal hernia was present.      The entire examined stomach was normal.      The examined duodenum was normal. Impression:           - Small hiatal hernia.                       - Normal stomach.                       - Normal examined duodenum.                       - No specimens collected. Recommendation:       - Discharge patient to home.                       - Resume previous diet.                       - Perform a colonoscopy. Procedure Code(s):    --- Professional ---                       [id number], Esophagogastroduodenoscopy, flexible, transoral;                        diagnostic, including collection of specimen(s) by                        brushing or washing, when performed (separate procedure) Diagnosis Code(s):    --- Professional ---                       D50.9, Iron deficiency anemia, unspecified CPT copyright 2016 American Medical Association. All rights reserved. The codes documented in this report are preliminary and upon coder review may  be revised to meet current compliance requirements. Lucilla Lame MD, MD 11/06/2016 10:12:11 AM This report has been signed electronically. Number of Addenda: 0 Note Initiated On: 11/06/2016 9:44 AM Total Procedure Duration: 0 hours 1 minute 39 seconds       [facility]

## 2016-11-06 NOTE — Anesthesia Procedure Notes (Signed)
Performed by: Ustin Cruickshank Pre-anesthesia Checklist: Patient identified, Emergency Drugs available, Suction available, Timeout performed and Patient being monitored Patient Re-evaluated:Patient Re-evaluated prior to induction Oxygen Delivery Method: Nasal cannula Placement Confirmation: positive ETCO2       

## 2016-11-06 NOTE — Transfer of Care (Signed)
Immediate Anesthesia Transfer of Care Note  Patient: Emma Campbell  Procedure(s) Performed: Procedure(s) with comments: COLONOSCOPY WITH PROPOFOL (N/A) - DIABETIC ESOPHAGOGASTRODUODENOSCOPY (EGD) WITH PROPOFOL (N/A) POLYPECTOMY  Patient Location: PACU  Anesthesia Type: General  Level of Consciousness: awake, alert  and patient cooperative  Airway and Oxygen Therapy: Patient Spontanous Breathing and Patient connected to supplemental oxygen  Post-op Assessment: Post-op Vital signs reviewed, Patient's Cardiovascular Status Stable, Respiratory Function Stable, Patent Airway and No signs of Nausea or vomiting  Post-op Vital Signs: Reviewed and stable  Complications: No apparent anesthesia complications

## 2016-11-07 ENCOUNTER — Encounter: Payer: Self-pay | Admitting: Gastroenterology

## 2016-11-08 ENCOUNTER — Encounter: Payer: Self-pay | Admitting: Gastroenterology

## 2016-11-10 ENCOUNTER — Encounter: Payer: Self-pay | Admitting: Gastroenterology

## 2016-12-05 DIAGNOSIS — K1379 Other lesions of oral mucosa: Secondary | ICD-10-CM | POA: Diagnosis not present

## 2016-12-05 DIAGNOSIS — E119 Type 2 diabetes mellitus without complications: Secondary | ICD-10-CM | POA: Diagnosis not present

## 2016-12-05 DIAGNOSIS — E7800 Pure hypercholesterolemia, unspecified: Secondary | ICD-10-CM | POA: Diagnosis not present

## 2016-12-05 DIAGNOSIS — K0889 Other specified disorders of teeth and supporting structures: Secondary | ICD-10-CM | POA: Diagnosis not present

## 2016-12-05 DIAGNOSIS — K029 Dental caries, unspecified: Secondary | ICD-10-CM | POA: Diagnosis not present

## 2016-12-05 DIAGNOSIS — K0510 Chronic gingivitis, plaque induced: Secondary | ICD-10-CM | POA: Diagnosis not present

## 2016-12-05 DIAGNOSIS — I10 Essential (primary) hypertension: Secondary | ICD-10-CM | POA: Diagnosis not present

## 2016-12-11 ENCOUNTER — Ambulatory Visit (INDEPENDENT_AMBULATORY_CARE_PROVIDER_SITE_OTHER): Payer: Medicare HMO | Admitting: Family Medicine

## 2016-12-11 ENCOUNTER — Encounter: Payer: Self-pay | Admitting: Family Medicine

## 2016-12-11 VITALS — BP 160/80 | HR 87 | Resp 12 | Ht 61.0 in | Wt 130.8 lb

## 2016-12-11 DIAGNOSIS — Z23 Encounter for immunization: Secondary | ICD-10-CM | POA: Diagnosis not present

## 2016-12-11 DIAGNOSIS — I480 Paroxysmal atrial fibrillation: Secondary | ICD-10-CM

## 2016-12-11 DIAGNOSIS — E785 Hyperlipidemia, unspecified: Secondary | ICD-10-CM

## 2016-12-11 DIAGNOSIS — I10 Essential (primary) hypertension: Secondary | ICD-10-CM | POA: Diagnosis not present

## 2016-12-11 DIAGNOSIS — N182 Chronic kidney disease, stage 2 (mild): Secondary | ICD-10-CM | POA: Diagnosis not present

## 2016-12-11 DIAGNOSIS — E1122 Type 2 diabetes mellitus with diabetic chronic kidney disease: Secondary | ICD-10-CM | POA: Diagnosis not present

## 2016-12-11 DIAGNOSIS — R7989 Other specified abnormal findings of blood chemistry: Secondary | ICD-10-CM | POA: Diagnosis not present

## 2016-12-11 DIAGNOSIS — E559 Vitamin D deficiency, unspecified: Secondary | ICD-10-CM | POA: Diagnosis not present

## 2016-12-11 DIAGNOSIS — R809 Proteinuria, unspecified: Secondary | ICD-10-CM

## 2016-12-11 DIAGNOSIS — Z950 Presence of cardiac pacemaker: Secondary | ICD-10-CM

## 2016-12-11 DIAGNOSIS — D509 Iron deficiency anemia, unspecified: Secondary | ICD-10-CM

## 2016-12-11 DIAGNOSIS — E538 Deficiency of other specified B group vitamins: Secondary | ICD-10-CM

## 2016-12-11 DIAGNOSIS — E1129 Type 2 diabetes mellitus with other diabetic kidney complication: Secondary | ICD-10-CM

## 2016-12-11 LAB — POCT GLYCOSYLATED HEMOGLOBIN (HGB A1C): Hemoglobin A1C: 6.4

## 2016-12-11 MED ORDER — AMLODIPINE BESYLATE-VALSARTAN 5-320 MG PO TABS: 1.0000 | ORAL_TABLET | Freq: Every day | ORAL | 1 refills | Status: DC

## 2016-12-11 MED ORDER — METFORMIN HCL ER 750 MG PO TB24: 750.0000 mg | ORAL_TABLET | Freq: Every evening | ORAL | 1 refills | Status: DC

## 2016-12-11 MED ORDER — HYDROCHLOROTHIAZIDE 25 MG PO TABS: 25.0000 mg | ORAL_TABLET | Freq: Every day | ORAL | 1 refills | Status: DC

## 2016-12-11 MED ORDER — ROSUVASTATIN CALCIUM 5 MG PO TABS: 5.0000 mg | ORAL_TABLET | Freq: Every day | ORAL | 3 refills | Status: DC

## 2016-12-11 NOTE — Patient Instructions (Signed)

## 2016-12-11 NOTE — Progress Notes (Signed)
Name: Emma Campbell   MRN: [id number]    DOB: Mar 08, 1932   Date:12/11/2016       Progress Note  Subjective  Chief Complaint  Chief Complaint  Patient presents with  . Diabetes  . Hypertension  . Hyperlipidemia  . Anemia    HPI  DMII with renal manifestation: CKI and microalbuminuria. HgbA1C has been normal, she denies hypoglycemia episodes. She feels well. Glucose at home is getting checked a couple of times weekly and is running around 125, 130 the highest lately She is complaint with her diet. She denies polyphagia, polydipsia or polyuria ( except right after she takes HCTZ) . Eye exam is scheduled for Feb 19th, 2018.Continue ARB for kidney protection, urine micro back to normal.   HTN: taking medication, however skips HCTZ when she goes to church and forgot to take it yesterday when she returned. BP is high today,  bp at home is around 130's.No orthostatic changes , no chest pain or SOB.  Hyperlipidemia: taking Crestor5 mg and denies myalgias. Labs done in 11/2015 were at goal and reviewed with patient. We will recheck labs today   Sick Sinus Syndrome: she had a pace maker placed in 2006 and is taking Amiodarone since 2006, she is now down to 100 mg of Amiodarone daily, last TSH was back to normal we will recheck today  no chest pain or palpitation, no syncope. Sees Dr. Clayborn Bigness , she is [name] score of 2, and is on aspirin only without problems.   Vitamin D deficiency: still taking supplementation, denies fatigue  Osteopenia: used to take Fosamax but has been off and last bone density reviewed with patient, low FRAX score, continue high calcium diet and vitamin D supplementation   Anemia: iron deficiency and positive hemoccult, seen by Dr. Durwin Reges and will have EGD and colonoscopy September 2018, and we will recheck labs if still has anemia we will refer her back to Dr . Durwin Reges for capsule endoscopy as recommended by him .   Patient Active Problem List   Diagnosis Date Noted  .  Iron deficiency anemia   . Polyp of sigmoid colon   . Type 2 diabetes mellitus with stage 2 chronic kidney disease, without long-term current use of insulin ([place]) 08/03/2015  . Arthritis, degenerative 05/03/2015  . Menopausal and perimenopausal disorder 05/03/2015  . Chronic kidney disease (CKD), stage III (moderate) ([place]) 11/30/2014  . High risk medication use 11/30/2014  . Diverticulosis 11/30/2014  . Fibrocystic breast disease 11/30/2014  . Elevated LFTs 11/30/2014  . Systolic ejection murmur 29/92/4268  . Paroxysmal A-fib ([place]) 11/30/2014  . Microalbuminuria 07/31/2014  . Elevated TSH 07/31/2014  . History of cardiac pacemaker 07/31/2014  . Sick sinus syndrome ([place]) 08/13/2013  . Hypercholesteremia 08/13/2013  . Hypertension, benign 08/13/2013  . Diabetes mellitus with renal manifestations, controlled ([place]) 08/13/2013  . Avitaminosis D 04/12/2009    Past Surgical History:  Procedure Laterality Date  . COLONOSCOPY WITH PROPOFOL N/A 11/06/2016   Procedure: COLONOSCOPY WITH PROPOFOL;  Surgeon: Lucilla Lame, MD;  Location: [place];  Service: Endoscopy;  Laterality: N/A;  DIABETIC  . ESOPHAGOGASTRODUODENOSCOPY (EGD) WITH PROPOFOL N/A 11/06/2016   Procedure: ESOPHAGOGASTRODUODENOSCOPY (EGD) WITH PROPOFOL;  Surgeon: Lucilla Lame, MD;  Location: [place];  Service: Endoscopy;  Laterality: N/A;  . PACEMAKER INSERTION  2006  . POLYPECTOMY  11/06/2016   Procedure: POLYPECTOMY;  Surgeon: Lucilla Lame, MD;  Location: [place];  Service: Endoscopy;;    Family History  Problem Relation Age of  Onset  . Hypertension Mother   . Stroke Mother   . Diabetes Daughter   . Stroke Father   . Hypertension Maternal Grandmother     Social History   Social History  . Marital status: Widowed    Spouse name: N/A  . Number of children: 1  . Years of education: 81   Occupational History  . Retired [employer] at [place] Topics  .  Smoking status: Never Smoker  . Smokeless tobacco: Never Used  . Alcohol use No  . Drug use: No  . Sexual activity: No   Other Topics Concern  . Not on file   Social History Narrative  . No narrative on file     Current Outpatient Prescriptions:  .  amiodarone (PACERONE) 200 MG tablet, Take 100 mg by mouth daily., Disp: , Rfl:  .  amLODipine-valsartan (EXFORGE) 5-320 MG tablet, Take 1 tablet by mouth daily., Disp: 90 tablet, Rfl: 1 .  aspirin 81 MG tablet, Take 81 mg by mouth daily., Disp: , Rfl:  .  cholecalciferol (VITAMIN D) 1000 UNITS tablet, Take 200 Units by mouth daily., Disp: , Rfl:  .  folic acid (FOLVITE) 1 MG tablet, Take 1 mg by mouth daily., Disp: , Rfl:  .  hydrochlorothiazide (HYDRODIURIL) 25 MG tablet, Take 1 tablet (25 mg total) by mouth daily., Disp: 90 tablet, Rfl: 1 .  metFORMIN (GLUCOPHAGE-XR) 750 MG 24 hr tablet, Take 1 tablet (750 mg total) by mouth every evening., Disp: 90 tablet, Rfl: 1 .  rosuvastatin (CRESTOR) 5 MG tablet, Take 1 tablet (5 mg total) by mouth daily., Disp: 90 tablet, Rfl: 3  Allergies  Allergen Reactions  . Ace Inhibitors      ROS  Constitutional: Negative for fever or weight change.  Respiratory: Negative for cough and shortness of breath.   Cardiovascular: Negative for chest pain or palpitations.  Gastrointestinal: Negative for abdominal pain, no bowel changes.  Musculoskeletal: Negative for gait problem or joint swelling.  Skin: Negative for rash.  Neurological: Negative for dizziness or headache.  No other specific complaints in a complete review of systems (except as listed in HPI above).   Objective  Vitals:   12/11/16 1037 12/11/16 1050  BP: (!) 160/80 (!) 160/80  Pulse: 87   Resp: 12   SpO2: 98%   Weight: 130 lb 12.8 oz (59.3 kg)   Height: 5\' 1"  (1.549 m)     Body mass index is 24.71 kg/m.  Physical Exam  Constitutional: Patient appears well-developed and well-nourished. ONo distress.  HEENT: head  atraumatic, normocephalic, pupils equal and reactive to light, neck supple, throat within normal limits Cardiovascular: Normal rate, regular rhythm and normal heart sounds.  Positive for SEM murmur heard. No BLE edema. Pulmonary/Chest: Effort normal and breath sounds normal. No respiratory distress. Abdominal: Soft.  There is no tenderness. Muscular Skeletal: mild scoliosis  Psychiatric: Patient has a normal mood and affect. behavior is normal. Judgment and thought content normal.  Recent Results (from the past 2160 hour(s))  Glucose, capillary     Status: Abnormal   Collection Time: 11/06/16  9:33 AM  Result Value Ref Range   Glucose-Capillary 119 (H) 65 - 99 mg/dL  Glucose, capillary     Status: Abnormal   Collection Time: 11/06/16 10:34 AM  Result Value Ref Range   Glucose-Capillary 112 (H) 65 - 99 mg/dL  POCT HgB A1C     Status: Abnormal   Collection Time: 12/11/16  11:02 AM  Result Value Ref Range   Hemoglobin A1C 6.4     Diabetic Foot Exam: Diabetic Foot Exam - Simple   Simple Foot Form Diabetic Foot exam was performed with the following findings:  Yes 12/11/2016 11:19 AM  Visual Inspection See comments:  Yes Sensation Testing Intact to touch and monofilament testing bilaterally:  Yes Pulse Check Posterior Tibialis and Dorsalis pulse intact bilaterally:  Yes Comments Thick toenails.       PHQ2/9: Depression screen [facility] 2/9 10/19/2016 06/13/2016 04/11/2016 01/25/2016 12/09/2015  Decreased Interest 0 0 0 0 0  Down, Depressed, Hopeless 0 0 0 0 0  PHQ - 2 Score 0 0 0 0 0     Fall Risk: Fall Risk  12/11/2016 10/19/2016 06/13/2016 04/11/2016 01/25/2016  Falls in the past year? Yes Yes No No No  Number falls in past yr: 1 1 - - -  Injury with Fall? Yes Yes - - -  Risk for fall due to : Other (Comment) - - - -  Follow up Education provided Falls evaluation completed - - -     Assessment & Plan  1. Hypertension, benign BP elevated, she skipped HCTZ yesterday when she  went to church - hydrochlorothiazide (HYDRODIURIL) 25 MG tablet; Take 1 tablet (25 mg total) by mouth daily.  Dispense: 90 tablet; Refill: 1 - amLODipine-valsartan (EXFORGE) 5-320 MG tablet; Take 1 tablet by mouth daily.  Dispense: 90 tablet; Refill: 1 - COMPLETE METABOLIC PANEL WITH GFR  2. Type 2 diabetes mellitus with stage 2 chronic kidney disease, without long-term current use of insulin (HCC)  - POCT HgB A1C - metFORMIN (GLUCOPHAGE-XR) 750 MG 24 hr tablet; Take 1 tablet (750 mg total) by mouth every evening.  Dispense: 90 tablet; Refill: 1 - amLODipine-valsartan (EXFORGE) 5-320 MG tablet; Take 1 tablet by mouth daily.  Dispense: 90 tablet; Refill: 1  3. Type 2 diabetes mellitus with microalbuminuria, without long-term current use of insulin (HCC)  - POCT HgB A1C - amLODipine-valsartan (EXFORGE) 5-320 MG tablet; Take 1 tablet by mouth daily.  Dispense: 90 tablet; Refill: 1  4. Dyslipidemia  - rosuvastatin (CRESTOR) 5 MG tablet; Take 1 tablet (5 mg total) by mouth daily.  Dispense: 90 tablet; Refill: 3 - Lipid panel  5. Need for immunization against influenza  - Flu Vaccine High dose  6. B12 deficiency  - Vitamin B12  7. Paroxysmal A-fib (HCC)  Doing well on medication   8. History of cardiac pacemaker  stable  9. Iron deficiency anemia, unspecified iron deficiency anemia type  - CBC with Differential/Platelet - Iron, TIBC and Ferritin Panel; Future  10. Elevated TSH  - TSH  11. Vitamin D deficiency  - VITAMIN D 25 Hydroxy (Vit-D Deficiency, Fractures)

## 2016-12-12 LAB — CBC WITH DIFFERENTIAL/PLATELET
BASOS PCT: 0.8 %
Basophils Absolute: 38 cells/uL (ref 0–200)
EOS ABS: 158 {cells}/uL (ref 15–500)
Eosinophils Relative: 3.3 %
HCT: 27.5 % — ABNORMAL LOW (ref 35.0–45.0)
Hemoglobin: 8.7 g/dL — ABNORMAL LOW (ref 11.7–15.5)
LYMPHS ABS: 1608 {cells}/uL (ref 850–3900)
MCH: 26.4 pg — AB (ref 27.0–33.0)
MCHC: 31.6 g/dL — ABNORMAL LOW (ref 32.0–36.0)
MCV: 83.6 fL (ref 80.0–100.0)
MONOS PCT: 7.5 %
MPV: 10 fL (ref 7.5–12.5)
NEUTROS ABS: 2635 {cells}/uL (ref 1500–7800)
Neutrophils Relative %: 54.9 %
PLATELETS: 268 10*3/uL (ref 140–400)
RBC: 3.29 10*6/uL — ABNORMAL LOW (ref 3.80–5.10)
RDW: 13.1 % (ref 11.0–15.0)
TOTAL LYMPHOCYTE: 33.5 %
WBC: 4.8 10*3/uL (ref 3.8–10.8)
WBCMIX: 360 {cells}/uL (ref 200–950)

## 2016-12-12 LAB — COMPLETE METABOLIC PANEL WITH GFR
AG Ratio: 1.3 (calc) (ref 1.0–2.5)
ALBUMIN MSPROF: 4 g/dL (ref 3.6–5.1)
ALKALINE PHOSPHATASE (APISO): 66 U/L (ref 33–130)
ALT: 14 U/L (ref 6–29)
AST: 15 U/L (ref 10–35)
BUN / CREAT RATIO: 16 (calc) (ref 6–22)
BUN: 15 mg/dL (ref 7–25)
CALCIUM: 9.1 mg/dL (ref 8.6–10.4)
CO2: 27 mmol/L (ref 20–32)
CREATININE: 0.95 mg/dL — AB (ref 0.60–0.88)
Chloride: 103 mmol/L (ref 98–110)
GFR, EST AFRICAN AMERICAN: 65 mL/min/{1.73_m2} (ref >=60)
GFR, EST NON AFRICAN AMERICAN: 56 mL/min/{1.73_m2} — AB (ref >=60)
GLOBULIN: 3.2 g/dL (ref 1.9–3.7)
Glucose, Bld: 117 mg/dL — ABNORMAL HIGH (ref 65–99)
Potassium: 4.2 mmol/L (ref 3.5–5.3)
SODIUM: 139 mmol/L (ref 135–146)
TOTAL PROTEIN: 7.2 g/dL (ref 6.1–8.1)
Total Bilirubin: 0.3 mg/dL (ref 0.2–1.2)

## 2016-12-12 LAB — LIPID PANEL
CHOLESTEROL: 184 mg/dL (ref <200)
HDL: 76 mg/dL (ref >50)
LDL CHOLESTEROL (CALC): 90 mg/dL
Non-HDL Cholesterol (Calc): 108 mg/dL (calc) (ref <130)
Total CHOL/HDL Ratio: 2.4 (calc) (ref <5.0)
Triglycerides: 89 mg/dL (ref <150)

## 2016-12-12 LAB — VITAMIN B12: Vitamin B-12: 174 pg/mL — ABNORMAL LOW (ref 200–1100)

## 2016-12-12 LAB — VITAMIN D 25 HYDROXY (VIT D DEFICIENCY, FRACTURES): Vit D, 25-Hydroxy: 22 ng/mL — ABNORMAL LOW (ref 30–100)

## 2016-12-12 LAB — TSH: TSH: 2.61 mIU/L (ref 0.40–4.50)

## 2016-12-13 ENCOUNTER — Telehealth: Payer: Self-pay

## 2016-12-13 NOTE — Telephone Encounter (Signed)
Pt needs to be scheduled for a [place] Capsule study. Hemoglobin has dropped to 8.7. Colonoscopy and EGD done on 11/06/16.   Left vm for pt to return my call to schedule.

## 2016-12-19 ENCOUNTER — Other Ambulatory Visit: Payer: Self-pay

## 2016-12-19 DIAGNOSIS — D500 Iron deficiency anemia secondary to blood loss (chronic): Secondary | ICD-10-CM

## 2016-12-22 ENCOUNTER — Ambulatory Visit
Admission: RE | Admit: 2016-12-22 | Discharge: 2016-12-22 | Disposition: A | Discharge status: Home / Self Care | Payer: Medicare HMO | Source: Ambulatory Visit | Attending: Gastroenterology | Admitting: Gastroenterology

## 2016-12-22 ENCOUNTER — Encounter
Admission: RE | Disposition: A | Discharge status: Home / Self Care | Payer: Self-pay | Source: Ambulatory Visit | Attending: Gastroenterology

## 2016-12-22 DIAGNOSIS — D509 Iron deficiency anemia, unspecified: Secondary | ICD-10-CM | POA: Diagnosis not present

## 2016-12-22 DIAGNOSIS — K31819 Angiodysplasia of stomach and duodenum without bleeding: Secondary | ICD-10-CM | POA: Insufficient documentation

## 2016-12-22 HISTORY — PX: GIVENS CAPSULE STUDY: SHX5432

## 2016-12-22 SURGERY — IMAGING PROCEDURE, GI TRACT, INTRALUMINAL, VIA CAPSULE

## 2016-12-25 ENCOUNTER — Encounter: Payer: Self-pay | Admitting: Gastroenterology

## 2016-12-26 ENCOUNTER — Ambulatory Visit (INDEPENDENT_AMBULATORY_CARE_PROVIDER_SITE_OTHER): Payer: Medicare HMO

## 2016-12-26 DIAGNOSIS — D519 Vitamin B12 deficiency anemia, unspecified: Secondary | ICD-10-CM

## 2016-12-26 DIAGNOSIS — D508 Other iron deficiency anemias: Secondary | ICD-10-CM

## 2016-12-26 MED ORDER — CYANOCOBALAMIN 1000 MCG/ML IJ SOLN
1000.0000 ug | Freq: Once | INTRAMUSCULAR | Status: AC
  Administered 2016-12-26: 1000 ug via INTRAMUSCULAR

## 2017-01-03 NOTE — Telephone Encounter (Signed)
Please review capsule study at [facility] and advise.

## 2017-01-10 ENCOUNTER — Ambulatory Visit (INDEPENDENT_AMBULATORY_CARE_PROVIDER_SITE_OTHER): Payer: Medicare HMO

## 2017-01-10 DIAGNOSIS — E538 Deficiency of other specified B group vitamins: Secondary | ICD-10-CM | POA: Diagnosis not present

## 2017-01-10 MED ORDER — CYANOCOBALAMIN 1000 MCG/ML IJ SOLN
1000.0000 ug | INTRAMUSCULAR | Status: DC
  Administered 2017-01-10 – 2017-05-18 (×5): 1000 ug via INTRAMUSCULAR

## 2017-01-10 NOTE — Progress Notes (Signed)
Patient tolerated injection well. NKDA. 

## 2017-01-12 ENCOUNTER — Other Ambulatory Visit: Payer: Self-pay

## 2017-01-15 ENCOUNTER — Other Ambulatory Visit: Payer: Self-pay

## 2017-01-15 DIAGNOSIS — D508 Other iron deficiency anemias: Secondary | ICD-10-CM

## 2017-01-22 ENCOUNTER — Encounter: Payer: Self-pay | Admitting: *Deleted

## 2017-01-23 ENCOUNTER — Ambulatory Visit
Admission: RE | Admit: 2017-01-23 | Discharge: 2017-01-23 | Disposition: A | Discharge status: Home / Self Care | Payer: Medicare HMO | Source: Ambulatory Visit | Attending: Gastroenterology | Admitting: Gastroenterology

## 2017-01-23 ENCOUNTER — Encounter: Payer: Self-pay | Admitting: Gastroenterology

## 2017-01-23 ENCOUNTER — Ambulatory Visit: Payer: Medicare HMO | Admitting: Anesthesiology

## 2017-01-23 ENCOUNTER — Encounter
Admission: RE | Disposition: A | Discharge status: Home / Self Care | Payer: Self-pay | Source: Ambulatory Visit | Attending: Gastroenterology

## 2017-01-23 ENCOUNTER — Encounter: Payer: Self-pay | Admitting: *Deleted

## 2017-01-23 DIAGNOSIS — D508 Other iron deficiency anemias: Secondary | ICD-10-CM

## 2017-01-23 DIAGNOSIS — M1990 Unspecified osteoarthritis, unspecified site: Secondary | ICD-10-CM | POA: Diagnosis not present

## 2017-01-23 DIAGNOSIS — E785 Hyperlipidemia, unspecified: Secondary | ICD-10-CM | POA: Insufficient documentation

## 2017-01-23 DIAGNOSIS — K449 Diaphragmatic hernia without obstruction or gangrene: Secondary | ICD-10-CM | POA: Insufficient documentation

## 2017-01-23 DIAGNOSIS — Z79899 Other long term (current) drug therapy: Secondary | ICD-10-CM | POA: Diagnosis not present

## 2017-01-23 DIAGNOSIS — Z7984 Long term (current) use of oral hypoglycemic drugs: Secondary | ICD-10-CM | POA: Insufficient documentation

## 2017-01-23 DIAGNOSIS — D509 Iron deficiency anemia, unspecified: Secondary | ICD-10-CM | POA: Diagnosis not present

## 2017-01-23 DIAGNOSIS — N183 Chronic kidney disease, stage 3 (moderate): Secondary | ICD-10-CM | POA: Insufficient documentation

## 2017-01-23 DIAGNOSIS — I129 Hypertensive chronic kidney disease with stage 1 through stage 4 chronic kidney disease, or unspecified chronic kidney disease: Secondary | ICD-10-CM | POA: Diagnosis not present

## 2017-01-23 DIAGNOSIS — Z7982 Long term (current) use of aspirin: Secondary | ICD-10-CM | POA: Diagnosis not present

## 2017-01-23 DIAGNOSIS — E119 Type 2 diabetes mellitus without complications: Secondary | ICD-10-CM | POA: Diagnosis not present

## 2017-01-23 DIAGNOSIS — E559 Vitamin D deficiency, unspecified: Secondary | ICD-10-CM | POA: Insufficient documentation

## 2017-01-23 DIAGNOSIS — R933 Abnormal findings on diagnostic imaging of other parts of digestive tract: Secondary | ICD-10-CM | POA: Diagnosis not present

## 2017-01-23 DIAGNOSIS — K5520 Angiodysplasia of colon without hemorrhage: Secondary | ICD-10-CM | POA: Diagnosis not present

## 2017-01-23 DIAGNOSIS — Z950 Presence of cardiac pacemaker: Secondary | ICD-10-CM | POA: Diagnosis not present

## 2017-01-23 DIAGNOSIS — K219 Gastro-esophageal reflux disease without esophagitis: Secondary | ICD-10-CM | POA: Diagnosis not present

## 2017-01-23 DIAGNOSIS — K31819 Angiodysplasia of stomach and duodenum without bleeding: Secondary | ICD-10-CM | POA: Diagnosis not present

## 2017-01-23 DIAGNOSIS — E1122 Type 2 diabetes mellitus with diabetic chronic kidney disease: Secondary | ICD-10-CM | POA: Diagnosis not present

## 2017-01-23 DIAGNOSIS — I4891 Unspecified atrial fibrillation: Secondary | ICD-10-CM | POA: Diagnosis not present

## 2017-01-23 DIAGNOSIS — I10 Essential (primary) hypertension: Secondary | ICD-10-CM | POA: Diagnosis not present

## 2017-01-23 HISTORY — PX: ESOPHAGOGASTRODUODENOSCOPY (EGD) WITH PROPOFOL: SHX5813

## 2017-01-23 LAB — GLUCOSE, CAPILLARY: GLUCOSE-CAPILLARY: 111 mg/dL — AB (ref 65–99)

## 2017-01-23 SURGERY — ESOPHAGOGASTRODUODENOSCOPY (EGD) WITH PROPOFOL
Anesthesia: General

## 2017-01-23 MED ORDER — PROPOFOL 10 MG/ML IV BOLUS
INTRAVENOUS | Status: DC | PRN
  Administered 2017-01-23: 320 mg via INTRAVENOUS

## 2017-01-23 MED ORDER — LIDOCAINE HCL (CARDIAC) 20 MG/ML IV SOLN
INTRAVENOUS | Status: DC | PRN
  Administered 2017-01-23: 50 mg via INTRAVENOUS

## 2017-01-23 MED ORDER — LIDOCAINE HCL (PF) 2 % IJ SOLN
INTRAMUSCULAR | Status: AC
  Filled 2017-01-23: qty 10

## 2017-01-23 MED ORDER — PROPOFOL 10 MG/ML IV BOLUS
INTRAVENOUS | Status: AC
  Filled 2017-01-23: qty 20

## 2017-01-23 MED ORDER — SODIUM CHLORIDE 0.9 % IV SOLN
INTRAVENOUS | Status: DC
  Administered 2017-01-23: 1000 mL via INTRAVENOUS

## 2017-01-23 NOTE — Anesthesia Postprocedure Evaluation (Signed)
Anesthesia Post Note  Patient: Emma Campbell  Procedure(s) Performed: ESOPHAGOGASTRODUODENOSCOPY (EGD) WITH PROPOFOL with PUSH (N/A )  Patient location during evaluation: PACU Anesthesia Type: General Level of consciousness: awake and alert and oriented Pain management: pain level controlled Vital Signs Assessment: post-procedure vital signs reviewed and stable Respiratory status: spontaneous breathing Cardiovascular status: blood pressure returned to baseline Anesthetic complications: no     Last Vitals:  Vitals:   01/23/17 1020 01/23/17 1030  BP: (!) 112/56 128/62  Pulse: (!) 57 60  Resp: 19 17  Temp:    SpO2: 92% 97%    Last Pain:  Vitals:   01/23/17 0853  TempSrc: Tympanic                 Nayib Remer

## 2017-01-23 NOTE — Anesthesia Preprocedure Evaluation (Signed)
Anesthesia Evaluation  Patient identified by MRN, date of birth, ID band Patient awake    Reviewed: Allergy & Precautions, H&P , NPO status , Patient's Chart, lab work & pertinent test results, Unable to perform ROS - Chart review only  History of Anesthesia Complications Negative for: history of anesthetic complications  Airway Mallampati: II  TM Distance: >3 FB     Dental  (+) Loose Two front teeth loose from a fall:   Pulmonary neg pulmonary ROS,    Pulmonary exam normal        Cardiovascular hypertension, Normal cardiovascular exam+ dysrhythmias (A fib; SSS s/p pacemaker) Atrial Fibrillation + pacemaker + Valvular Problems/Murmurs (heart murmur)   ECG 10/16/16: NORMAL SINUS RHYTHM NORMAL ECG   Neuro/Psych    GI/Hepatic GERD  ,  Endo/Other  diabetes, Well Controlled, Type 2, Oral Hypoglycemic Agents  Renal/GU Renal InsufficiencyRenal disease     Musculoskeletal  (+) Arthritis ,   Abdominal   Peds  Hematology  (+) anemia ,   Anesthesia Other Findings From cardiology visit 08/17/16:  Assessment   83 y.o. female with  1. Sick sinus syndrome (CMS-HCC)  2. Paroxysmal atrial fibrillation (CMS-HCC)  3. SSS (sick sinus syndrome) (CMS-HCC)  4. Bradycardia  5. Hyperlipidemia, unspecified hyperlipidemia type  6. Essential hypertension  7. Osteoarthritis, unspecified osteoarthritis type, unspecified site  8. Murmur, unspecified   Plan  1 sick sinus syndrome chronic stable status post permanent pacemaker no further recurrent symptoms continue current therapy 2 bradycardia symptomatic with syncope in the past now with permanent pacemaker the patient follow-up with pacer clinic 3 hyperlipidemia chronic stable currently on Crestor therapy for lipid management 4 diabetes type 2 uncomplicated continue metformin therapy for diabetes management 5 hypertension controlled on amlodipine valsartan HCTZ 6 atrial fibrillation  paroxysmal continue amiodarone for rhythm control currently not on anticoagulation patient unable to maintain sinus rhythm 7 murmur systolic last echo was 35/00/9381 will consider repeat for assessment of valvular structures 8 have the patient follow-up in 6 months  Return in about 6 months (around 02/16/2017).  DWAYNE Prince Rome, MD  Reproductive/Obstetrics negative OB ROS                             Anesthesia Physical  Anesthesia Plan  ASA: III  Anesthesia Plan: General   Post-op Pain Management:    Induction: Intravenous  PONV Risk Score and Plan: 2 and Propofol infusion  Airway Management Planned: Nasal Cannula  Additional Equipment:   Intra-op Plan:   Post-operative Plan:   Informed Consent: I have reviewed the patients History and Physical, chart, labs and discussed the procedure including the risks, benefits and alternatives for the proposed anesthesia with the patient or authorized representative who has indicated his/her understanding and acceptance.   Dental advisory given  Plan Discussed with: CRNA and Surgeon  Anesthesia Plan Comments:         Anesthesia Quick Evaluation

## 2017-01-23 NOTE — Anesthesia Post-op Follow-up Note (Signed)
Anesthesia QCDR form completed.        

## 2017-01-23 NOTE — H&P (Signed)
Emma Lame, MD [address] [place], [place] [address] [phone] Fax : [phone]  Primary Care Physician:  Steele Sizer, MD Primary Gastroenterologist:  Dr. Allen Norris  Pre-Procedure History & Physical: HPI:  Emma Campbell is a 83 y.o. female is here for an endoscopy.   Past Medical History:  Diagnosis Date  . Chronic kidney disease, stage III (moderate) (HCC)   . Diverticulosis   . Elevated LFTs   . Essential hypertension, malignant   . Fibrocystic breast disease   . Heart murmur   . Hyperlipidemia   . Menopausal problem   . Osteoarthrosis   . Osteoporosis   . Peripheral autonomic neuropathy   . Sick sinus syndrome (HCC)    Dr. Alfredo Batty  . Type II diabetes mellitus with renal manifestations ([place])   . Vitamin D deficiency     Past Surgical History:  Procedure Laterality Date  . COLONOSCOPY WITH PROPOFOL N/A 11/06/2016   Procedure: COLONOSCOPY WITH PROPOFOL;  Surgeon: Emma Lame, MD;  Location: [place];  Service: Endoscopy;  Laterality: N/A;  DIABETIC  . ESOPHAGOGASTRODUODENOSCOPY (EGD) WITH PROPOFOL N/A 11/06/2016   Procedure: ESOPHAGOGASTRODUODENOSCOPY (EGD) WITH PROPOFOL;  Surgeon: Emma Lame, MD;  Location: [place];  Service: Endoscopy;  Laterality: N/A;  . GIVENS CAPSULE STUDY N/A 12/22/2016   Procedure: GIVENS CAPSULE STUDY;  Surgeon: Emma Lame, MD;  Location: [facility] ENDOSCOPY;  Service: Endoscopy;  Laterality: N/A;  . PACEMAKER INSERTION  2006  . POLYPECTOMY  11/06/2016   Procedure: POLYPECTOMY;  Surgeon: Emma Lame, MD;  Location: [place];  Service: Endoscopy;;    Prior to Admission medications   Medication Sig Start Date End Date Taking? Authorizing Provider  amiodarone (PACERONE) 200 MG tablet Take 100 mg by mouth daily.   Yes Callwood, Dwayne D, MD  amLODipine-valsartan (EXFORGE) 5-320 MG tablet Take 1 tablet by mouth daily. 12/11/16  Yes Sowles, Drue Stager, MD  hydrochlorothiazide  (HYDRODIURIL) 25 MG tablet Take 1 tablet (25 mg total) by mouth daily. 12/11/16  Yes Steele Sizer, MD  aspirin 81 MG tablet Take 81 mg by mouth daily.    [provider]  cholecalciferol (VITAMIN D) 1000 UNITS tablet Take 200 Units by mouth daily.    [provider]  folic acid (FOLVITE) 1 MG tablet Take 1 mg by mouth daily.    [provider]  metFORMIN (GLUCOPHAGE-XR) 750 MG 24 hr tablet Take 1 tablet (750 mg total) by mouth every evening. 12/11/16 06/09/17  Steele Sizer, MD  rosuvastatin (CRESTOR) 5 MG tablet Take 1 tablet (5 mg total) by mouth daily. 12/11/16   Steele Sizer, MD    Allergies as of 01/16/2017 - Review Complete 12/11/2016  Allergen Reaction Noted  . Ace inhibitors  05/03/2015    Family History  Problem Relation Age of Onset  . Hypertension Mother   . Stroke Mother   . Diabetes Daughter   . Stroke Father   . Hypertension Maternal Grandmother     Social History   Socioeconomic History  . Marital status: Widowed    Spouse name: Not on file  . Number of children: 1  . Years of education: 62  . Highest education level: Not on file  Social Needs  . Financial resource strain: Not on file  . Food insecurity - worry: Not on file  . Food insecurity - inability: Not on file  . Transportation needs - medical: Not on file  . Transportation needs - non-medical: Not on file  Occupational History  .  Occupation: Retired Agricultural engineer at [employer]  . Smoking status: Never Smoker  . Smokeless tobacco: Never Used  Substance and Sexual Activity  . Alcohol use: No    Alcohol/week: 0.0 oz  . Drug use: No  . Sexual activity: No  Other Topics Concern  . Not on file  Social History Narrative  . Not on file    Review of Systems: See HPI, otherwise negative ROS  Physical Exam: BP (!) 141/62   Pulse 82   Temp (!) 97 F (36.1 C) (Tympanic)   Resp 18   Ht 5\' 3"  (1.6 m)   Wt 130 lb (59 kg)   SpO2 100%   BMI 23.03  kg/m  General:   Alert,  pleasant and cooperative in NAD Head:  Normocephalic and atraumatic. Neck:  Supple; no masses or thyromegaly. Lungs:  Clear throughout to auscultation.    Heart:  Regular rate and rhythm. Abdomen:  Soft, nontender and nondistended. Normal bowel sounds, without guarding, and without rebound.   Neurologic:  Alert and  oriented x4;  grossly normal neurologically.  Impression/Plan: Emma Campbell is here for an endoscopy to be performed for AVM in small bowel.  Risks, benefits, limitations, and alternatives regarding  endoscopy have been reviewed with the patient.  Questions have been answered.  All parties agreeable.   Emma Lame, MD  01/23/2017, 9:07 AM

## 2017-01-23 NOTE — Op Note (Signed)
[facility] Patient Name: Emma Campbell Procedure Date: 01/23/2017 9:40 AM MRN: [id number] Account #: [id number] Date of Birth: 1932-11-13 Admit Type: Outpatient Age: 83 Room: [facility] ENDO ROOM 4 Gender: Female Note Status: Finalized Procedure:            Upper GI endoscopy Indications:          Iron deficiency anemia, Abnormal BRAVO pH capsule                        results Providers:            Lucilla Lame MD, MD Referring MD:         Bethena Roys. Sowles, MD (Referring MD) Medicines:            Propofol per Anesthesia Complications:        No immediate complications. Procedure:            Pre-Anesthesia Assessment:                       - Prior to the procedure, a History and Physical was                        performed, and patient medications and allergies were                        reviewed. The patient's tolerance of previous                        anesthesia was also reviewed. The risks and benefits of                        the procedure and the sedation options and risks were                        discussed with the patient. All questions were                        answered, and informed consent was obtained. Prior                        Anticoagulants: The patient has taken no previous                        anticoagulant or antiplatelet agents. ASA Grade                        Assessment: II - A patient with mild systemic disease.                        After reviewing the risks and benefits, the patient was                        deemed in satisfactory condition to undergo the                        procedure.                       After obtaining informed consent, the endoscope was  passed under direct vision. Throughout the procedure,                        the patient's blood pressure, pulse, and oxygen                        saturations were monitored continuously. The                        Colonoscope was  introduced through the mouth, and                        advanced to the jejunum. The upper GI endoscopy was                        accomplished without difficulty. The patient tolerated                        the procedure well. Findings:      A small hiatal hernia was present.      A single 3 mm angioectasia without bleeding was found in the jejunum.       Coagulation for hemostasis using argon beam at 2 liters/minute and 30       watts was successful.      The entire examined stomach was normal.      The examined duodenum was normal. Impression:           - Small hiatal hernia.                       - A single non-bleeding angioectasia in the jejunum.                        Treated with argon beam coagulation.                       - Normal stomach.                       - Normal examined duodenum.                       - No specimens collected. Recommendation:       - Discharge patient to home.                       - Resume previous diet.                       - Continue present medications. Procedure Code(s):    --- Professional ---                       [phone], Esophagogastroduodenoscopy, flexible, transoral;                        with control of bleeding, any method Diagnosis Code(s):    --- Professional ---                       D50.9, Iron deficiency anemia, unspecified                       R93.3, Abnormal findings on diagnostic imaging of  other                        parts of digestive tract                       K55.20, Angiodysplasia of colon without hemorrhage CPT copyright 2016 American Medical Association. All rights reserved. The codes documented in this report are preliminary and upon coder review may  be revised to meet current compliance requirements. Lucilla Lame MD, MD 01/23/2017 10:07:08 AM This report has been signed electronically. Number of Addenda: 0 Note Initiated On: 01/23/2017 9:40 AM      [facility]

## 2017-01-23 NOTE — Transfer of Care (Signed)
Immediate Anesthesia Transfer of Care Note  Patient: Emma Campbell  Procedure(s) Performed: ESOPHAGOGASTRODUODENOSCOPY (EGD) WITH PROPOFOL with PUSH (N/A )  Patient Location: PACU  Anesthesia Type:MAC  Level of Consciousness: drowsy  Airway & Oxygen Therapy: Patient Spontanous Breathing and Patient connected to nasal cannula oxygen  Post-op Assessment: Report given to RN and Post -op Vital signs reviewed and stable  Post vital signs: Reviewed and stable  Last Vitals:  Vitals:   01/23/17 0853  BP: (!) 141/62  Pulse: 82  Resp: 18  Temp: (!) 36.1 C  SpO2: 100%    Last Pain:  Vitals:   01/23/17 0853  TempSrc: Tympanic         Complications: No apparent anesthesia complications

## 2017-01-24 ENCOUNTER — Encounter: Payer: Self-pay | Admitting: Gastroenterology

## 2017-02-06 DIAGNOSIS — R011 Cardiac murmur, unspecified: Secondary | ICD-10-CM | POA: Diagnosis not present

## 2017-02-06 DIAGNOSIS — E785 Hyperlipidemia, unspecified: Secondary | ICD-10-CM | POA: Diagnosis not present

## 2017-02-06 DIAGNOSIS — I10 Essential (primary) hypertension: Secondary | ICD-10-CM | POA: Diagnosis not present

## 2017-02-06 DIAGNOSIS — I480 Paroxysmal atrial fibrillation: Secondary | ICD-10-CM | POA: Diagnosis not present

## 2017-02-06 DIAGNOSIS — E119 Type 2 diabetes mellitus without complications: Secondary | ICD-10-CM | POA: Diagnosis not present

## 2017-02-06 DIAGNOSIS — R001 Bradycardia, unspecified: Secondary | ICD-10-CM | POA: Diagnosis not present

## 2017-02-06 DIAGNOSIS — M1990 Unspecified osteoarthritis, unspecified site: Secondary | ICD-10-CM | POA: Diagnosis not present

## 2017-02-06 DIAGNOSIS — I495 Sick sinus syndrome: Secondary | ICD-10-CM | POA: Diagnosis not present

## 2017-02-08 ENCOUNTER — Ambulatory Visit (INDEPENDENT_AMBULATORY_CARE_PROVIDER_SITE_OTHER): Payer: Medicare HMO

## 2017-02-08 DIAGNOSIS — E538 Deficiency of other specified B group vitamins: Secondary | ICD-10-CM

## 2017-02-08 NOTE — Progress Notes (Signed)
Patient was here for B-12 injection. She tolerated injection well. NKDA to medication.

## 2017-03-27 ENCOUNTER — Ambulatory Visit (INDEPENDENT_AMBULATORY_CARE_PROVIDER_SITE_OTHER): Payer: Medicare HMO

## 2017-03-27 DIAGNOSIS — E538 Deficiency of other specified B group vitamins: Secondary | ICD-10-CM

## 2017-03-27 DIAGNOSIS — D519 Vitamin B12 deficiency anemia, unspecified: Secondary | ICD-10-CM | POA: Diagnosis not present

## 2017-04-17 ENCOUNTER — Ambulatory Visit (INDEPENDENT_AMBULATORY_CARE_PROVIDER_SITE_OTHER): Payer: Medicare HMO | Admitting: Family Medicine

## 2017-04-17 ENCOUNTER — Encounter: Payer: Self-pay | Admitting: Family Medicine

## 2017-04-17 VITALS — BP 118/68 | HR 84 | Temp 97.9°F | Resp 16 | Ht 61.0 in | Wt 128.3 lb

## 2017-04-17 DIAGNOSIS — N182 Chronic kidney disease, stage 2 (mild): Secondary | ICD-10-CM

## 2017-04-17 DIAGNOSIS — I480 Paroxysmal atrial fibrillation: Secondary | ICD-10-CM

## 2017-04-17 DIAGNOSIS — E538 Deficiency of other specified B group vitamins: Secondary | ICD-10-CM

## 2017-04-17 DIAGNOSIS — D509 Iron deficiency anemia, unspecified: Secondary | ICD-10-CM

## 2017-04-17 DIAGNOSIS — E1122 Type 2 diabetes mellitus with diabetic chronic kidney disease: Secondary | ICD-10-CM | POA: Diagnosis not present

## 2017-04-17 DIAGNOSIS — I495 Sick sinus syndrome: Secondary | ICD-10-CM

## 2017-04-17 DIAGNOSIS — R7989 Other specified abnormal findings of blood chemistry: Secondary | ICD-10-CM

## 2017-04-17 DIAGNOSIS — E785 Hyperlipidemia, unspecified: Secondary | ICD-10-CM | POA: Diagnosis not present

## 2017-04-17 DIAGNOSIS — I10 Essential (primary) hypertension: Secondary | ICD-10-CM | POA: Diagnosis not present

## 2017-04-17 DIAGNOSIS — Z950 Presence of cardiac pacemaker: Secondary | ICD-10-CM

## 2017-04-17 MED ORDER — CYANOCOBALAMIN 1000 MCG/ML IJ SOLN: 1000.0000 ug | Freq: Once | INTRAMUSCULAR | Status: DC

## 2017-04-17 NOTE — Progress Notes (Signed)
Name: Emma Campbell   MRN: [id number]    DOB: 1933/01/18   Date:04/17/2017       Progress Note  Subjective  Chief Complaint  Chief Complaint  Patient presents with  . Medication Refill    4 month F/U  . Diabetes    Checks BS once to twice weekly but does not know averages  . Hypertension    Denies any symptoms states BP runs around 130/70's  . Hyperlipidemia  . Osteopenia    HPI  DMII with renal manifestation: CKI and microalbuminuria. HgbA1C has been normal, we will recheck hgbA1C at the labs.She denies hypoglycemia episodes. She feels well. Glucose at home is getting checked a couple of times weekly and is low 100's  highest lately 140  She is complaint with her diet. She denies polyphagia, polydipsia or polyuria ( except right after she takes HCTZ) . Eye exam is scheduled for Feb 19th, 2018 and needs to go back. Continue ARB for kidney protection, urine micro was back to normal.   HTN: taking medication daily and bp is at goal, no dizziness, chest pain or palpitation  Hyperlipidemia: taking Crestor5 mg and denies myalgias. Labs done in 11/2015 were at goal and reviewed with patient. We will recheck labs today   Sick Sinus Syndrome: she had a pace maker placed in 2006 and is taking Amiodarone since 2006, she is now down to 100 mg of Amiodarone daily, last TSH was back to normal we will recheck today  no chest pain or palpitation, no syncope ( the fall was secondary to tripping on dog's leash)  Sees Dr. Clayborn Bigness , she is [name] score of 2, and is on aspirin only without problems.   Vitamin D deficiency: still taking supplementation, denies fatigue  Osteopenia: used to take Fosamax but has been off and last bone density reviewed with patient, low FRAX score, continue high calcium diet and vitamin D supplementation   Anemia: iron deficiency and positive hemoccult, seen by Dr. Durwin Reges and had EGD and colonoscopy September 2018,diagnosed with AVM small bowel and had it cauterized, we  will recheck labs and if still low hgb we will refer to hematologist    Patient Active Problem List   Diagnosis Date Noted  . Gastrointestinal tract imaging abnormality   . Iron deficiency anemia   . Polyp of sigmoid colon   . Type 2 diabetes mellitus with stage 2 chronic kidney disease, without long-term current use of insulin ([place]) 08/03/2015  . Arthritis, degenerative 05/03/2015  . Menopausal and perimenopausal disorder 05/03/2015  . Chronic kidney disease (CKD), stage III (moderate) ([place]) 11/30/2014  . High risk medication use 11/30/2014  . Diverticulosis 11/30/2014  . Fibrocystic breast disease 11/30/2014  . Elevated LFTs 11/30/2014  . Systolic ejection murmur 38/75/6433  . Paroxysmal A-fib ([place]) 11/30/2014  . Microalbuminuria 07/31/2014  . Elevated TSH 07/31/2014  . History of cardiac pacemaker 07/31/2014  . Sick sinus syndrome ([place]) 08/13/2013  . Hypercholesteremia 08/13/2013  . Hypertension, benign 08/13/2013  . Diabetes mellitus with renal manifestations, controlled ([place]) 08/13/2013  . Avitaminosis D 04/12/2009    Past Surgical History:  Procedure Laterality Date  . COLONOSCOPY WITH PROPOFOL N/A 11/06/2016   Procedure: COLONOSCOPY WITH PROPOFOL;  Surgeon: Lucilla Lame, MD;  Location: [place];  Service: Endoscopy;  Laterality: N/A;  DIABETIC  . ESOPHAGOGASTRODUODENOSCOPY (EGD) WITH PROPOFOL N/A 11/06/2016   Procedure: ESOPHAGOGASTRODUODENOSCOPY (EGD) WITH PROPOFOL;  Surgeon: Lucilla Lame, MD;  Location: [place];  Service: Endoscopy;  Laterality: N/A;  .  ESOPHAGOGASTRODUODENOSCOPY (EGD) WITH PROPOFOL N/A 01/23/2017   Procedure: ESOPHAGOGASTRODUODENOSCOPY (EGD) WITH PROPOFOL with PUSH;  Surgeon: Lucilla Lame, MD;  Location: [facility] ENDOSCOPY;  Service: Endoscopy;  Laterality: N/A;  . GIVENS CAPSULE STUDY N/A 12/22/2016   Procedure: GIVENS CAPSULE STUDY;  Surgeon: Lucilla Lame, MD;  [facility] ENDOSCOPY;  Service: Endoscopy;  Laterality: N/A;  .  PACEMAKER INSERTION  2006  . POLYPECTOMY  11/06/2016   Procedure: POLYPECTOMY;  Surgeon: Lucilla Lame, MD;  Location: [place];  Service: Endoscopy;;    Family History  Problem Relation Age of Onset  . Hypertension Mother   . Stroke Mother   . Diabetes Daughter   . Stroke Father   . Hypertension Maternal Grandmother     Social History   Socioeconomic History  . Marital status: Widowed    Spouse name: Not on file  . Number of children: 1  . Years of education: 71  . Highest education level: Not on file  Social Needs  . Financial resource strain: Not on file  . Food insecurity - worry: Not on file  . Food insecurity - inability: Not on file  . Transportation needs - medical: Not on file  . Transportation needs - non-medical: Not on file  Occupational History  . Occupation: Retired Agricultural engineer at [employer]  . Smoking status: Never Smoker  . Smokeless tobacco: Never Used  Substance and Sexual Activity  . Alcohol use: No    Alcohol/week: 0.0 oz  . Drug use: No  . Sexual activity: No  Other Topics Concern  . Not on file  Social History Narrative  . Not on file     Current Outpatient Medications:  .  amiodarone (PACERONE) 200 MG tablet, Take 100 mg by mouth daily., Disp: , Rfl:  .  amLODipine-valsartan (EXFORGE) 5-320 MG tablet, Take 1 tablet by mouth daily., Disp: 90 tablet, Rfl: 1 .  aspirin 81 MG tablet, Take 81 mg by mouth daily., Disp: , Rfl:  .  cholecalciferol (VITAMIN D) 1000 UNITS tablet, Take 200 Units by mouth daily., Disp: , Rfl:  .  folic acid (FOLVITE) 1 MG tablet, Take 1 mg by mouth daily., Disp: , Rfl:  .  hydrochlorothiazide (HYDRODIURIL) 25 MG tablet, Take 1 tablet (25 mg total) by mouth daily., Disp: 90 tablet, Rfl: 1 .  lidocaine (XYLOCAINE) 2 % solution, 10 mL by Mouth route every eight (8) hours as needed., Disp: , Rfl:  .  metFORMIN (GLUCOPHAGE-XR) 750 MG 24 hr tablet, Take 1 tablet (750 mg total) by mouth every  evening., Disp: 90 tablet, Rfl: 1 .  rosuvastatin (CRESTOR) 5 MG tablet, Take 1 tablet (5 mg total) by mouth daily., Disp: 90 tablet, Rfl: 3  Current Facility-Administered Medications:  .  cyanocobalamin ((VITAMIN B-12)) injection 1,000 mcg, 1,000 mcg, Intramuscular, Q30 days, Steele Sizer, MD, 1,000 mcg at 03/27/17 1022  Allergies  Allergen Reactions  . Ace Inhibitors      ROS  Constitutional: Negative for fever or weight change.  Respiratory: Negative for cough and shortness of breath.   Cardiovascular: Negative for chest pain or palpitations.  Gastrointestinal: Negative for abdominal pain, no bowel changes.  Musculoskeletal: Negative for gait problem or joint swelling.  Skin: Negative for rash.  Neurological: Negative for dizziness or headache.  No other specific complaints in a complete review of systems (except as listed in HPI above).  Objective  Vitals:   04/17/17 1034  BP: 118/68  Pulse: 84  Resp: 16  Temp: 97.9 F (36.6 C)  TempSrc: Oral  SpO2: 97%  Weight: 128 lb 4.8 oz (58.2 kg)  Height: 5\' 1"  (1.549 m)    Body mass index is 24.24 kg/m.  Physical Exam  Constitutional: Patient appears well-developed and well-nourishedNo distress.  HEENT: head atraumatic, normocephalic, pupils equal and reactive to light,  neck supple, throat within normal limits Cardiovascular: Normal rate, regular rhythm and normal heart sounds.  Systolic ejection  murmur heard. No BLE edema. Pulmonary/Chest: Effort normal and breath sounds normal. No respiratory distress. Abdominal: Soft.  There is no tenderness. Psychiatric: Patient has a normal mood and affect. behavior is normal. Judgment and thought content normal.  Recent Results (from the past 2160 hour(s))  Glucose, capillary     Status: Abnormal   Collection Time: 01/23/17  8:51 AM  Result Value Ref Range   Glucose-Capillary 111 (H) 65 - 99 mg/dL     PHQ2/9: Depression screen [facility] 2/9 04/17/2017 10/19/2016 06/13/2016  04/11/2016 01/25/2016  Decreased Interest 0 0 0 0 0  Down, Depressed, Hopeless 0 0 0 0 0  PHQ - 2 Score 0 0 0 0 0     Fall Risk: Fall Risk  04/17/2017 12/11/2016 10/19/2016 06/13/2016 04/11/2016  Falls in the past year? Yes Yes Yes No No  Number falls in past yr: 1 1 1  - -  Injury with Fall? Yes Yes Yes - -  Comment Hit her face and broke some teeth - - - -  Risk for fall due to : - Other (Comment) - - -  Follow up - Education provided [place] evaluation completed - -      Functional Status Survey: Is the patient deaf or have difficulty hearing?: No Does the patient have difficulty seeing, even when wearing glasses/contacts?: No Does the patient have difficulty concentrating, remembering, or making decisions?: No Does the patient have difficulty walking or climbing stairs?: No Does the patient have difficulty dressing or bathing?: No Does the patient have difficulty doing errands alone such as visiting a doctor's office or shopping?: No    Assessment & Plan  1. Type 2 diabetes mellitus with stage 2 chronic kidney disease, without long-term current use of insulin (HCC)  - Hemoglobin A1c  2. Hypertension, benign  At goal   3. B12 deficiency  - cyanocobalamin ((VITAMIN B-12)) injection 1,000 mcg  4. Dyslipidemia  Continue medication   5. Paroxysmal A-fib (HCC)  Rate controlled  6. History of cardiac pacemaker  Sees cardiologist   7. Iron deficiency anemia, unspecified iron deficiency anemia type  Seen by Dr. Durwin Reges, hiatal hernia and small AVM that was cauterized, we will check labs and if still abnormal refer to hematologist  - CBC with Differential/Platelet - Iron, TIBC and Ferritin Panel; Future  8. Elevated TSH  - TSH  9. Sick sinus syndrome [facility]: Lambert Rhonda W)  pacemaker

## 2017-04-18 LAB — CBC WITH DIFFERENTIAL/PLATELET
BASOS ABS: 41 {cells}/uL (ref 0–200)
BASOS PCT: 1 %
EOS ABS: 172 {cells}/uL (ref 15–500)
Eosinophils Relative: 4.2 %
HEMATOCRIT: 28.6 % — AB (ref 35.0–45.0)
HEMOGLOBIN: 9 g/dL — AB (ref 11.7–15.5)
LYMPHS ABS: 1681 {cells}/uL (ref 850–3900)
MCH: 25.8 pg — AB (ref 27.0–33.0)
MCHC: 31.5 g/dL — AB (ref 32.0–36.0)
MCV: 81.9 fL (ref 80.0–100.0)
MPV: 10.5 fL (ref 7.5–12.5)
Monocytes Relative: 7.9 %
NEUTROS ABS: 1882 {cells}/uL (ref 1500–7800)
Neutrophils Relative %: 45.9 %
Platelets: 245 10*3/uL (ref 140–400)
RBC: 3.49 10*6/uL — ABNORMAL LOW (ref 3.80–5.10)
RDW: 14.3 % (ref 11.0–15.0)
Total Lymphocyte: 41 %
WBC: 4.1 10*3/uL (ref 3.8–10.8)
WBCMIX: 324 {cells}/uL (ref 200–950)

## 2017-04-18 LAB — HEMOGLOBIN A1C
Hgb A1c MFr Bld: 6.5 % of total Hgb — ABNORMAL HIGH (ref <5.7)
Mean Plasma Glucose: 140 (calc)
eAG (mmol/L): 7.7 (calc)

## 2017-04-18 LAB — TSH: TSH: 3.7 mIU/L (ref 0.40–4.50)

## 2017-04-19 ENCOUNTER — Telehealth: Payer: Self-pay | Admitting: Family Medicine

## 2017-04-19 ENCOUNTER — Other Ambulatory Visit: Payer: Self-pay | Admitting: Family Medicine

## 2017-04-19 DIAGNOSIS — D509 Iron deficiency anemia, unspecified: Secondary | ICD-10-CM

## 2017-04-19 NOTE — Telephone Encounter (Signed)
Reason for CRM: Amy from [facility] called and said that she got a referral for patient for the cancer center. She wants to know if that is correct or did Dr. [name] meant to send it to [place] center. Please call her back [phone].

## 2017-04-20 ENCOUNTER — Ambulatory Visit (INDEPENDENT_AMBULATORY_CARE_PROVIDER_SITE_OTHER): Payer: Medicare HMO

## 2017-04-20 ENCOUNTER — Telehealth: Payer: Self-pay | Admitting: Oncology

## 2017-04-20 DIAGNOSIS — E538 Deficiency of other specified B group vitamins: Secondary | ICD-10-CM

## 2017-04-20 MED ORDER — CYANOCOBALAMIN 1000 MCG/ML IJ SOLN
1000.0000 ug | Freq: Once | INTRAMUSCULAR | Status: DC
Start: 2017-04-20 — End: 2017-04-20

## 2017-04-20 NOTE — Progress Notes (Signed)
Patient came in for her B-12 injection. She/He tolerated it well. NKDA.    

## 2017-04-20 NOTE — Telephone Encounter (Signed)
Pt was not happy about the appt, but did agree to come and be evaluated. Expressed understanding of [place] location.

## 2017-04-20 NOTE — Telephone Encounter (Signed)
She is going to the cancer center, but is only to see the hematologist to evaluate her anemia, not because she has cancer

## 2017-04-24 ENCOUNTER — Encounter: Payer: Self-pay | Admitting: Oncology

## 2017-04-24 ENCOUNTER — Inpatient Hospital Stay: Payer: Medicare HMO

## 2017-04-24 ENCOUNTER — Inpatient Hospital Stay: Payer: Medicare HMO | Attending: Oncology | Admitting: Oncology

## 2017-04-24 VITALS — BP 145/77 | HR 67 | Temp 97.9°F | Resp 18 | Ht 61.0 in | Wt 128.5 lb

## 2017-04-24 DIAGNOSIS — Z79899 Other long term (current) drug therapy: Secondary | ICD-10-CM | POA: Diagnosis not present

## 2017-04-24 DIAGNOSIS — E538 Deficiency of other specified B group vitamins: Secondary | ICD-10-CM

## 2017-04-24 DIAGNOSIS — Z7982 Long term (current) use of aspirin: Secondary | ICD-10-CM | POA: Diagnosis not present

## 2017-04-24 DIAGNOSIS — E1122 Type 2 diabetes mellitus with diabetic chronic kidney disease: Secondary | ICD-10-CM | POA: Insufficient documentation

## 2017-04-24 DIAGNOSIS — E559 Vitamin D deficiency, unspecified: Secondary | ICD-10-CM | POA: Diagnosis not present

## 2017-04-24 DIAGNOSIS — D508 Other iron deficiency anemias: Secondary | ICD-10-CM | POA: Insufficient documentation

## 2017-04-24 DIAGNOSIS — Z7984 Long term (current) use of oral hypoglycemic drugs: Secondary | ICD-10-CM | POA: Diagnosis not present

## 2017-04-24 LAB — URINALYSIS, COMPLETE (UACMP) WITH MICROSCOPIC
Bacteria, UA: NONE SEEN
Bilirubin Urine: NEGATIVE
GLUCOSE, UA: NEGATIVE mg/dL
HGB URINE DIPSTICK: NEGATIVE
Ketones, ur: NEGATIVE mg/dL
NITRITE: NEGATIVE
PH: 7 (ref 5.0–8.0)
PROTEIN: NEGATIVE mg/dL
SPECIFIC GRAVITY, URINE: 1.014 (ref 1.005–1.030)

## 2017-04-24 LAB — FOLATE: FOLATE: 11.4 ng/mL (ref >5.9)

## 2017-04-24 LAB — VITAMIN B12: VITAMIN B 12: 1054 pg/mL — AB (ref 180–914)

## 2017-04-24 NOTE — Progress Notes (Signed)
Hematology/Oncology Consult note [facility] [phone] [phone]   Patient Care Team: Steele Sizer, MD as PCP - General (Family Medicine) Dingeldein, Remo Lipps, MD as Consulting Physician (Ophthalmology) Yolonda Kida, MD as Consulting Physician (Cardiology)  REFERRING PROVIDER: Steele Sizer, MD CHIEF COMPLAINTS/PURPOSE OF CONSULTATION:  Evaluation of anemia  HISTORY OF PRESENTING ILLNESS:  Emma Campbell is a  84 y.o.  female with PMH listed below who was referred to me for evaluation of anemia.  Patient follows up with primary care physician and has had labs done recently. 04/17/17 CBC showed hemoglobin 9, MCV 81.9, platelet 245,000, WBC 4.1, normal differential.  Iron panel showed TIBC 442, ferritin 11, saturation 15%. Patient reports feeling tired and weak.  Denies seeing any blood in the stool.  Denies any shortness of breath or chest pain. Review of Systems  Constitutional: Positive for malaise/fatigue. Negative for chills and fever.  HENT: Negative for nosebleeds.   Eyes: Negative for pain.  Respiratory: Negative for cough, sputum production and shortness of breath.   Cardiovascular: Negative for chest pain and claudication.  Gastrointestinal: Negative for abdominal pain, blood in stool, nausea and vomiting.  Genitourinary: Negative for dysuria.  Musculoskeletal: Negative for myalgias.  Skin: Negative for rash.  Neurological: Negative for dizziness, sensory change and weakness.  Endo/Heme/Allergies: Negative for environmental allergies.  Psychiatric/Behavioral: Negative for depression.    MEDICAL HISTORY:  Past Medical History:  Diagnosis Date  . Chronic kidney disease, stage III (moderate) (HCC)   . Diverticulosis   . Elevated LFTs   . Essential hypertension, malignant   . Fibrocystic breast disease   . Heart murmur   . Hyperlipidemia   . Menopausal problem   . Osteoarthrosis   . Osteoporosis   . Peripheral  autonomic neuropathy   . Sick sinus syndrome (HCC)    Dr. Alfredo Batty  . Type II diabetes mellitus with renal manifestations ([place])   . Vitamin D deficiency     SURGICAL HISTORY: Past Surgical History:  Procedure Laterality Date  . COLONOSCOPY WITH PROPOFOL N/A 11/06/2016   Procedure: COLONOSCOPY WITH PROPOFOL;  Surgeon: Lucilla Lame, MD;  Location: [place];  Service: Endoscopy;  Laterality: N/A;  DIABETIC  . ESOPHAGOGASTRODUODENOSCOPY (EGD) WITH PROPOFOL N/A 11/06/2016   Procedure: ESOPHAGOGASTRODUODENOSCOPY (EGD) WITH PROPOFOL;  Surgeon: Lucilla Lame, MD;  Location: [place];  Service: Endoscopy;  Laterality: N/A;  . ESOPHAGOGASTRODUODENOSCOPY (EGD) WITH PROPOFOL N/A 01/23/2017   Procedure: ESOPHAGOGASTRODUODENOSCOPY (EGD) WITH PROPOFOL with PUSH;  Surgeon: Lucilla Lame, MD;  Location: [facility] ENDOSCOPY;  Service: Endoscopy;  Laterality: N/A;  . GIVENS CAPSULE STUDY N/A 12/22/2016   Procedure: GIVENS CAPSULE STUDY;  Surgeon: Lucilla Lame, MD;  [facility] ENDOSCOPY;  Service: Endoscopy;  Laterality: N/A;  . PACEMAKER INSERTION  2006  . POLYPECTOMY  11/06/2016   Procedure: POLYPECTOMY;  Surgeon: Lucilla Lame, MD;  Location: [place];  Service: Endoscopy;;    SOCIAL HISTORY: Social History   Socioeconomic History  . Marital status: Widowed    Spouse name: Not on file  . Number of children: 1  . Years of education: 75  . Highest education level: Not on file  Social Needs  . Financial resource strain: Not on file  . Food insecurity - worry: Not on file  . Food insecurity - inability: Not on file  . Transportation needs - medical: Not on file  . Transportation needs - non-medical: Not on file  Occupational History  . Occupation: Retired Agricultural engineer at [employer]  Tobacco Use  . Smoking status: Never Smoker  . Smokeless tobacco: Never Used  Substance and Sexual Activity  . Alcohol use: No    Alcohol/week: 0.0 oz  . Drug use: No  .  Sexual activity: No  Other Topics Concern  . Not on file  Social History Narrative  . Not on file    FAMILY HISTORY: Family History  Problem Relation Age of Onset  . Hypertension Mother   . Stroke Mother   . Diabetes Daughter   . Stroke Father   . Hypertension Maternal Grandmother     ALLERGIES:  is allergic to ace inhibitors.  MEDICATIONS:  Current Outpatient Medications  Medication Sig Dispense Refill  . amiodarone (PACERONE) 200 MG tablet Take 100 mg by mouth daily.    Marland Kitchen amLODipine-valsartan (EXFORGE) 5-320 MG tablet Take 1 tablet by mouth daily. 90 tablet 1  . aspirin 81 MG tablet Take 81 mg by mouth daily.    . cholecalciferol (VITAMIN D) 1000 UNITS tablet Take 200 Units by mouth daily.    . folic acid (FOLVITE) 1 MG tablet Take 1 mg by mouth daily.    . hydrochlorothiazide (HYDRODIURIL) 25 MG tablet Take 1 tablet (25 mg total) by mouth daily. 90 tablet 1  . lidocaine (XYLOCAINE) 2 % solution 10 mL by Mouth route every eight (8) hours as needed.    . metFORMIN (GLUCOPHAGE-XR) 750 MG 24 hr tablet Take 1 tablet (750 mg total) by mouth every evening. 90 tablet 1  . rosuvastatin (CRESTOR) 5 MG tablet Take 1 tablet (5 mg total) by mouth daily. 90 tablet 3   Current Facility-Administered Medications  Medication Dose Route Frequency Provider Last Rate Last Dose  . cyanocobalamin ((VITAMIN B-12)) injection 1,000 mcg  1,000 mcg Intramuscular Q30 days Steele Sizer, MD   1,000 mcg at 04/20/17 1427  . cyanocobalamin ((VITAMIN B-12)) injection 1,000 mcg  1,000 mcg Intramuscular Once Steele Sizer, MD         PHYSICAL EXAMINATION: ECOG PERFORMANCE STATUS: 1 - Symptomatic but completely ambulatory Vitals:   04/24/17 0950  BP: (!) 145/77  Pulse: 67  Resp: 18  Temp: 97.9 F (36.6 C)   Filed Weights   04/24/17 0950  Weight: 128 lb 8 oz (58.3 kg)    Physical Exam  Constitutional: She is oriented to person, place, and time and well-developed, well-nourished, and in no  distress. No distress.  HENT:  Head: Normocephalic and atraumatic.  Mouth/Throat: No oropharyngeal exudate.  Eyes: EOM are normal. Pupils are equal, round, and reactive to light. No scleral icterus.  Neck: Normal range of motion. Neck supple.  Cardiovascular: Normal rate. Exam reveals no friction rub.  No murmur heard. Pulmonary/Chest: Breath sounds normal. No respiratory distress.  Abdominal: Soft. Bowel sounds are normal. There is no tenderness.  Musculoskeletal: Normal range of motion. She exhibits no edema.  Lymphadenopathy:    She has no cervical adenopathy.  Neurological: She is alert and oriented to person, place, and time. No cranial nerve deficit.  Skin: Skin is warm and dry. No erythema.  Psychiatric: Affect normal.     LABORATORY DATA:  I have reviewed the data as listed Lab Results  Component Value Date   WBC 4.1 04/17/2017   HGB 9.0 (L) 04/17/2017   HCT 28.6 (L) 04/17/2017   MCV 81.9 04/17/2017   PLT 245 04/17/2017   Recent Labs    12/11/16 1139  NA 139  K 4.2  CL 103  CO2 27  GLUCOSE 117*  BUN 15  CREATININE 0.95*  CALCIUM 9.1  GFRNONAA 56*  GFRAA 65  PROT 7.2  AST 15  ALT 14  BILITOT 0.3    Iron/TIBC/Ferritin/ %Sat    Component Value Date/Time   IRON 68 04/11/2016 1103   TIBC 442 04/11/2016 1103   FERRITIN 11 (L) 04/11/2016 1103   IRONPCTSAT 15 04/11/2016 1103     ASSESSMENT & PLAN:  1. Other iron deficiency anemia   2. Vitamin B12 deficiency    # Plan IV iron with Venofer 200mg  twice a week for 4 doses. Allergy reactions/infusion reaction including anaphylactic reaction discussed with patient. Patient voices understanding and willing to proceed.  # 01/23/2017 EGD showed non bleeding angiotecsia, treated with APC. 12/22/2016 Capsule study showed duodenum AVM.  11/06/2016 colonoscopy showed non bleeding hemorroids, sigmoid polyp, and diverticulosis.   Check B12, UA, folate.   All questions were answered. The patient knows to call the  clinic with any problems questions or concerns.  Return of visit: 5 weeks with repeat labs.  Thank you for this kind referral and the opportunity to participate in the care of this patient. A copy of today's note is routed to referring provider    Earlie Server, MD, PhD Hematology Oncology [facility] at [facility] Pager- [phone] 04/24/2017

## 2017-05-01 ENCOUNTER — Inpatient Hospital Stay: Payer: Medicare HMO | Attending: Oncology

## 2017-05-01 VITALS — BP 143/70 | HR 70 | Resp 20

## 2017-05-01 DIAGNOSIS — D508 Other iron deficiency anemias: Secondary | ICD-10-CM | POA: Insufficient documentation

## 2017-05-01 MED ORDER — IRON SUCROSE 20 MG/ML IV SOLN
200.0000 mg | Freq: Once | INTRAVENOUS | Status: AC
  Administered 2017-05-01: 200 mg via INTRAVENOUS
  Filled 2017-05-01: qty 10

## 2017-05-01 MED ORDER — SODIUM CHLORIDE 0.9 % IV SOLN
Freq: Once | INTRAVENOUS | Status: AC
  Administered 2017-05-01: 14:00:00 via INTRAVENOUS
  Filled 2017-05-01: qty 1000

## 2017-05-08 ENCOUNTER — Inpatient Hospital Stay: Payer: Medicare HMO

## 2017-05-08 VITALS — BP 131/71 | HR 90 | Temp 96.0°F | Resp 19

## 2017-05-08 DIAGNOSIS — D508 Other iron deficiency anemias: Secondary | ICD-10-CM

## 2017-05-08 MED ORDER — SODIUM CHLORIDE 0.9 % IV SOLN
Freq: Once | INTRAVENOUS | Status: AC
  Administered 2017-05-08: 14:00:00 via INTRAVENOUS
  Filled 2017-05-08: qty 1000

## 2017-05-08 MED ORDER — IRON SUCROSE 20 MG/ML IV SOLN
200.0000 mg | Freq: Once | INTRAVENOUS | Status: AC
  Administered 2017-05-08: 200 mg via INTRAVENOUS
  Filled 2017-05-08: qty 10

## 2017-05-15 ENCOUNTER — Ambulatory Visit: Payer: Medicare HMO

## 2017-05-15 ENCOUNTER — Inpatient Hospital Stay: Payer: Medicare HMO

## 2017-05-15 DIAGNOSIS — D508 Other iron deficiency anemias: Secondary | ICD-10-CM

## 2017-05-15 MED ORDER — SODIUM CHLORIDE 0.9 % IV SOLN
Freq: Once | INTRAVENOUS | Status: AC
  Administered 2017-05-15: 13:00:00 via INTRAVENOUS
  Filled 2017-05-15: qty 1000

## 2017-05-15 MED ORDER — IRON SUCROSE 20 MG/ML IV SOLN
200.0000 mg | Freq: Once | INTRAVENOUS | Status: AC
Start: 2017-05-15 — End: 2017-05-15
  Administered 2017-05-15: 200 mg via INTRAVENOUS
  Filled 2017-05-15: qty 10

## 2017-05-18 ENCOUNTER — Ambulatory Visit (INDEPENDENT_AMBULATORY_CARE_PROVIDER_SITE_OTHER): Payer: Medicare HMO

## 2017-05-18 ENCOUNTER — Ambulatory Visit: Payer: Medicare HMO

## 2017-05-18 VITALS — BP 110/60 | HR 62 | Temp 97.7°F | Resp 12 | Ht 61.0 in | Wt 128.3 lb

## 2017-05-18 DIAGNOSIS — Z0000 Encounter for general adult medical examination without abnormal findings: Secondary | ICD-10-CM

## 2017-05-18 DIAGNOSIS — E538 Deficiency of other specified B group vitamins: Secondary | ICD-10-CM | POA: Diagnosis not present

## 2017-05-18 NOTE — Patient Instructions (Addendum)
Emma Campbell , Thank you for taking time to come for your Medicare Wellness Visit. I appreciate your ongoing commitment to your health goals. Please review the following plan we discussed and let me know if I can assist you in the future.   Screening recommendations/referrals: Colorectal Screening: No longer required Mammogram: No longer required Bone Density: No longer required Lung Cancer Screening: You do not qualify for this screening Hepatitis C Screening: You do not qualify for this screening  Vision and Dental Exams: Recommended annual ophthalmology exams for early detection of glaucoma and other disorders of the eye Recommended annual dental exams for proper oral hygiene  Diabetic Exams: Recommended annual diabetic eye exams for early detection of retinopathy Recommended annual diabetic foot exams for early detection of peripheral neuropathy.  Diabetic Eye Exam: Completed 04/17/16. Please schedule an appointment with your ophthalmologist for completion. Diabetic Foot Exam: Up to date  Vaccinations: Influenza vaccine: Up to date Pneumococcal vaccine: Completed series Tdap vaccine: Up to date Shingles vaccine: Up to date. Please call your insurance company to determine your out of pocket expense for the Shingrix vaccine. You may also receive this vaccine at your local pharmacy or Health Dept.   Advanced directives: Advance directive discussed with you today. I have provided a copy for you to complete at home and have notarized. Once this is complete please bring a copy in to our office so we can scan it into your chart.  Conditions/risks identified: Recommend to drink at least 6-8 8oz glasses of water per day.  Next appointment: Please schedule an appointment with Dr. [name] within the next 30 days for follow up after today's Annual Wellness Visit.  Please schedule your Annual Wellness Visit with your Nurse Health Advisor in one year.  Preventive Care 4 Years and Older,  Female Preventive care refers to lifestyle choices and visits with your health care provider that can promote health and wellness. What does preventive care include?  A yearly physical exam. This is also called an annual well check.  Dental exams once or twice a year.  Routine eye exams. Ask your health care provider how often you should have your eyes checked.  Personal lifestyle choices, including:  Daily care of your teeth and gums.  Regular physical activity.  Eating a healthy diet.  Avoiding tobacco and drug use.  Limiting alcohol use.  Practicing safe sex.  Taking low-dose aspirin every day.  Taking vitamin and mineral supplements as recommended by your health care provider. What happens during an annual well check? The services and screenings done by your health care provider during your annual well check will depend on your age, overall health, lifestyle risk factors, and family history of disease. Counseling  Your health care provider may ask you questions about your:  Alcohol use.  Tobacco use.  Drug use.  Emotional well-being.  Home and relationship well-being.  Sexual activity.  Eating habits.  History of falls.  Memory and ability to understand (cognition).  Work and work Statistician.  Reproductive health. Screening  You may have the following tests or measurements:  Height, weight, and BMI.  Blood pressure.  Lipid and cholesterol levels. These may be checked every 5 years, or more frequently if you are over 23 years old.  Skin check.  Lung cancer screening. You may have this screening every year starting at age 3 if you have a 30-pack-year history of smoking and currently smoke or have quit within the past 15 years.  Fecal occult blood test (  FOBT) of the stool. You may have this test every year starting at age 27.  Flexible sigmoidoscopy or colonoscopy. You may have a sigmoidoscopy every 5 years or a colonoscopy every 10 years  starting at age 16.  Hepatitis C blood test.  Hepatitis B blood test.  Sexually transmitted disease (STD) testing.  Diabetes screening. This is done by checking your blood sugar (glucose) after you have not eaten for a while (fasting). You may have this done every 1-3 years.  Bone density scan. This is done to screen for osteoporosis. You may have this done starting at age 16.  Mammogram. This may be done every 1-2 years. Talk to your health care provider about how often you should have regular mammograms. Talk with your health care provider about your test results, treatment options, and if necessary, the need for more tests. Vaccines  Your health care provider may recommend certain vaccines, such as:  Influenza vaccine. This is recommended every year.  Tetanus, diphtheria, and acellular pertussis (Tdap, Td) vaccine. You may need a Td booster every 10 years.  Zoster vaccine. You may need this after age 52.  Pneumococcal 13-valent conjugate (PCV13) vaccine. One dose is recommended after age 30.  Pneumococcal polysaccharide (PPSV23) vaccine. One dose is recommended after age 62. Talk to your health care provider about which screenings and vaccines you need and how often you need them. This information is not intended to replace advice given to you by your health care provider. Make sure you discuss any questions you have with your health care provider. Document Released: 03/12/2015 Document Revised: 11/03/2015 Document Reviewed: 12/15/2014 Elsevier Interactive Patient Education  2017 [place] Prevention in the Home Falls can cause injuries. They can happen to people of all ages. There are many things you can do to make your home safe and to help prevent falls. What can I do on the outside of my home?  Regularly fix the edges of walkways and driveways and fix any cracks.  Remove anything that might make you trip as you walk through a door, such as a raised step or  threshold.  Trim any bushes or trees on the path to your home.  Use bright outdoor lighting.  Clear any walking paths of anything that might make someone trip, such as rocks or tools.  Regularly check to see if handrails are loose or broken. Make sure that both sides of any steps have handrails.  Any raised decks and porches should have guardrails on the edges.  Have any leaves, snow, or ice cleared regularly.  Use sand or salt on walking paths during winter.  Clean up any spills in your garage right away. This includes oil or grease spills. What can I do in the bathroom?  Use night lights.  Install grab bars by the toilet and in the tub and shower. Do not use towel bars as grab bars.  Use non-skid mats or decals in the tub or shower.  If you need to sit down in the shower, use a plastic, non-slip stool.  Keep the floor dry. Clean up any water that spills on the floor as soon as it happens.  Remove soap buildup in the tub or shower regularly.  Attach bath mats securely with double-sided non-slip rug tape.  Do not have throw rugs and other things on the floor that can make you trip. What can I do in the bedroom?  Use night lights.  Make sure that you have a light  by your bed that is easy to reach.  Do not use any sheets or blankets that are too big for your bed. They should not hang down onto the floor.  Have a firm chair that has side arms. You can use this for support while you get dressed.  Do not have throw rugs and other things on the floor that can make you trip. What can I do in the kitchen?  Clean up any spills right away.  Avoid walking on wet floors.  Keep items that you use a lot in easy-to-reach places.  If you need to reach something above you, use a strong step stool that has a grab bar.  Keep electrical cords out of the way.  Do not use floor polish or wax that makes floors slippery. If you must use wax, use non-skid floor wax.  Do not have  throw rugs and other things on the floor that can make you trip. What can I do with my stairs?  Do not leave any items on the stairs.  Make sure that there are handrails on both sides of the stairs and use them. Fix handrails that are broken or loose. Make sure that handrails are as long as the stairways.  Check any carpeting to make sure that it is firmly attached to the stairs. Fix any carpet that is loose or worn.  Avoid having throw rugs at the top or bottom of the stairs. If you do have throw rugs, attach them to the floor with carpet tape.  Make sure that you have a light switch at the top of the stairs and the bottom of the stairs. If you do not have them, ask someone to add them for you. What else can I do to help prevent falls?  Wear shoes that:  Do not have high heels.  Have rubber bottoms.  Are comfortable and fit you well.  Are closed at the toe. Do not wear sandals.  If you use a stepladder:  Make sure that it is fully opened. Do not climb a closed stepladder.  Make sure that both sides of the stepladder are locked into place.  Ask someone to hold it for you, if possible.  Clearly mark and make sure that you can see:  Any grab bars or handrails.  First and last steps.  Where the edge of each step is.  Use tools that help you move around (mobility aids) if they are needed. These include:  Canes.  Walkers.  Scooters.  Crutches.  Turn on the lights when you go into a dark area. Replace any light bulbs as soon as they burn out.  Set up your furniture so you have a clear path. Avoid moving your furniture around.  If any of your floors are uneven, fix them.  If there are any pets around you, be aware of where they are.  Review your medicines with your doctor. Some medicines can make you feel dizzy. This can increase your chance of falling. Ask your doctor what other things that you can do to help prevent falls. This information is not intended to  replace advice given to you by your health care provider. Make sure you discuss any questions you have with your health care provider. Document Released: 12/10/2008 Document Revised: 07/22/2015 Document Reviewed: 03/20/2014 Elsevier Interactive Patient Education  2017 [place] provider would like to you have your annual eye exam. Please contact your current eye doctor to schedule your annual eye  exam to evaluate the health of your eyes. If you have not yet established care with a physician to evaluate the health of your eyes, below is a list of physicians for you to contact and to establish care.   [facility] Address: [address] [place], [place] [address]   Address: [address] [place], [place] [address]  Phone: [phone]      Phone: [phone]  Website: [email or web address]    Website: [email or web address]     [facility]  Address: [address], [place], [place] [address]   Address: [place], [place], [place] [address] Phone: [phone]      Phone: [phone]    [facility] Address: [address], [place], [place] [address]  Phone: [phone]

## 2017-05-18 NOTE — Progress Notes (Signed)
Erroneous diagnosis code removed. Encounter for Medicare annual wellness exam, Z00.00 remains as primary diagnosis.

## 2017-05-18 NOTE — Progress Notes (Addendum)
Error- wrong chart opened. -MM This encounter was created in error - please disregard.

## 2017-05-18 NOTE — Progress Notes (Addendum)
Subjective:   Emma Campbell is a 84 y.o. female who presents for Medicare Annual (Subsequent) preventive examination.  Review of Systems:  N/A Cardiac Risk Factors include: advanced age (>16men, >54 women);diabetes mellitus;dyslipidemia;hypertension     Objective:     Vitals: BP 110/60 (BP Location: Left Arm, Patient Position: Sitting)   Pulse 62   Temp 97.7 F (36.5 C) (Oral)   Resp 12   Ht 5\' 1"  (1.549 m)   Wt 128 lb 4.8 oz (58.2 kg)   SpO2 97%   BMI 24.24 kg/m   Body mass index is 24.24 kg/m.  Advanced Directives 05/18/2017 04/24/2017 04/24/2017 11/06/2016 10/19/2016 08/10/2016 06/13/2016  Does Patient Have a Medical Advance Directive? No No Yes No No No No  Type of Advance Directive - Public librarian;Living will - - - -  Does patient want to make changes to medical advance directive? - No - Patient declined No - Patient declined - - - -  Copy of [place] in Chart? - No - copy requested Yes - - - -  Would patient like information on creating a medical advance directive? Yes (MAU/Ambulatory/Procedural Areas - Information given) No - Patient declined - No - Patient declined - - -    Tobacco Social History   Tobacco Use  Smoking Status Never Smoker  Smokeless Tobacco Never Used  Tobacco Comment   smoking cessation materials not required     Counseling given: No Comment: smoking cessation materials not required   Clinical Intake:  Pre-visit preparation completed: Yes  Pain : No/denies pain   BMI - recorded: 24.29 Nutritional Risks: None Is the patient diabetic?  Yes If diabetic, was a CBG obtained today?  No Did the patient bring in their glucometer from home?  No Comments: Pt monitors her CBG's at home. Denies any financial strains with device and supplies. Denies having any concerns about obtaining her own CBG's. No additional education required at this time.  Diabetic Exams: Diabetic Eye Exam: Completed 04/17/16. Pt has been  advised to call her ophthalmologist for completion. Insurance no longer covers [employer]. Pt has been provided with a list of providers to establish care with a new physician. Diabetic Foot Exam: Completed 12/11/16.   How often do you need to have someone help you when you read instructions, pamphlets, or other written materials from your doctor or pharmacy?: 1 - Never  Interpreter Needed?: No  Information entered by :: AEversole, LPN  Hospitalizations, surgeries, and ER visits occurring within the previous 12 months:  Within the previous 12 months, pt has not underwent any surgical procedures.  Pt was seen in the ED on 12/05/16 and treated by Dr. Alita Chyle for oral pain. Recommended follow up with dentist.  In addition to the above listed ED visit, pt underwent the following diagnostic procedures:  11/06/16 Colonoscopy with Dr. Allen Norris 12/22/16 Givens capsule study with Dr. Allen Norris 01/23/17 EGD with Dr. Allen Norris   Past Medical History:  Diagnosis Date  . Anemia   . Chronic kidney disease, stage III (moderate) (HCC)   . Diverticulosis   . Elevated LFTs   . Essential hypertension, malignant   . Fibrocystic breast disease   . Heart murmur   . Hyperlipidemia   . Menopausal problem   . Osteoarthrosis   . Osteoporosis   . Peripheral autonomic neuropathy   . Sick sinus syndrome (HCC)    Dr. Alfredo Batty  . Type II diabetes mellitus with renal manifestations ([place])   .  Vitamin D deficiency    Past Surgical History:  Procedure Laterality Date  . COLONOSCOPY WITH PROPOFOL N/A 11/06/2016   Procedure: COLONOSCOPY WITH PROPOFOL;  Surgeon: Lucilla Lame, MD;  Location: [place];  Service: Endoscopy;  Laterality: N/A;  DIABETIC  . ESOPHAGOGASTRODUODENOSCOPY (EGD) WITH PROPOFOL N/A 11/06/2016   Procedure: ESOPHAGOGASTRODUODENOSCOPY (EGD) WITH PROPOFOL;  Surgeon: Lucilla Lame, MD;  Location: [place];  Service: Endoscopy;  Laterality: N/A;  .  ESOPHAGOGASTRODUODENOSCOPY (EGD) WITH PROPOFOL N/A 01/23/2017   Procedure: ESOPHAGOGASTRODUODENOSCOPY (EGD) WITH PROPOFOL with PUSH;  Surgeon: Lucilla Lame, MD;  Location: [facility] ENDOSCOPY;  Service: Endoscopy;  Laterality: N/A;  . GIVENS CAPSULE STUDY N/A 12/22/2016   Procedure: GIVENS CAPSULE STUDY;  Surgeon: Lucilla Lame, MD;  Location[facility] ENDOSCOPY;  Service: Endoscopy;  Laterality: N/A;  . PACEMAKER INSERTION  2006  . POLYPECTOMY  11/06/2016   Procedure: POLYPECTOMY;  Surgeon: Lucilla Lame, MD;  Location: [place];  Service: Endoscopy;;   Family History  Problem Relation Age of Onset  . Hypertension Mother   . Stroke Mother   . Diabetes Daughter   . Stroke Father   . Healthy Sister   . Hypertension Maternal Grandmother    Social History   Socioeconomic History  . Marital status: Widowed    Spouse name: Shanon Brow  . Number of children: 1  . Years of education: 2 years of college  . Highest education level: 12th grade  Occupational History  . Occupation: Retired Agricultural engineer at [employer]  . Financial resource strain: Not hard at all  . Food insecurity:    Worry: Never true    Inability: Never true  . Transportation needs:    Medical: No    Non-medical: No  Tobacco Use  . Smoking status: Never Smoker  . Smokeless tobacco: Never Used  . Tobacco comment: smoking cessation materials not required  Substance and Sexual Activity  . Alcohol use: No    Alcohol/week: 0.0 oz  . Drug use: No  . Sexual activity: Never  Lifestyle  . Physical activity:    Days per week: 7 days    Minutes per session: 30 min  . Stress: Not at all  Relationships  . Social connections:    Talks on phone: Patient refused    Gets together: Patient refused    Attends religious service: Patient refused    Active member of club or organization: Patient refused    Attends meetings of clubs or organizations: Patient refused    Relationship status: Widowed  Other Topics  Concern  . Not on file  Social History Narrative  . Not on file    Outpatient Encounter Medications as of 05/18/2017  Medication Sig  . amiodarone (PACERONE) 200 MG tablet Take 100 mg by mouth daily.  Marland Kitchen amLODipine-valsartan (EXFORGE) 5-320 MG tablet Take 1 tablet by mouth daily.  Marland Kitchen aspirin 81 MG tablet Take 81 mg by mouth daily.  . cholecalciferol (VITAMIN D) 1000 UNITS tablet Take 200 Units by mouth daily.  . folic acid (FOLVITE) 1 MG tablet Take 1 mg by mouth daily.  . hydrochlorothiazide (HYDRODIURIL) 25 MG tablet Take 1 tablet (25 mg total) by mouth daily.  . metFORMIN (GLUCOPHAGE-XR) 750 MG 24 hr tablet Take 1 tablet (750 mg total) by mouth every evening.  . rosuvastatin (CRESTOR) 5 MG tablet Take 1 tablet (5 mg total) by mouth daily.  . [DISCONTINUED] lidocaine (XYLOCAINE) 2 % solution 10 mL by Mouth route every eight (8) hours  as needed.   Facility-Administered Encounter Medications as of 05/18/2017  Medication  . cyanocobalamin ((VITAMIN B-12)) injection 1,000 mcg  . cyanocobalamin ((VITAMIN B-12)) injection 1,000 mcg    Activities of Daily Living In your present state of health, do you have any difficulty performing the following activities: 05/18/2017 04/17/2017  Hearing? N N  Comment denies hearing aids -  Vision? N N  Comment wears eyeglasses -  Difficulty concentrating or making decisions? N N  Walking or climbing stairs? N N  Dressing or bathing? N N  Doing errands, shopping? N N  Preparing Food and eating ? N -  Comment denies dentures -  Using the Toilet? N -  In the past six months, have you accidently leaked urine? N -  Do you have problems with loss of bowel control? N -  Managing your Medications? N -  Managing your Finances? N -  Housekeeping or managing your Housekeeping? N -  Some recent data might be hidden    Patient Care Team: Steele Sizer, MD as PCP - General (Family Medicine) Yolonda Kida, MD as Consulting Physician (Cardiology) Earlie Server, MD as Consulting Physician (Oncology)    Assessment:   This is a routine wellness examination for [place].  Exercise Activities and Dietary recommendations Current Exercise Habits: Home exercise routine, Type of exercise: walking, Time (Minutes): 30, Frequency (Times/Week): 7, Weekly Exercise (Minutes/Week): 210, Intensity: Mild, Exercise limited by: None identified  Goals    . DIET - INCREASE WATER INTAKE     Recommend to drink at least 6-8 8oz glasses of water per day.       Fall Risk Fall Risk  05/18/2017 05/18/2017 04/17/2017 12/11/2016 10/19/2016  Falls in the past year? Yes Yes Yes Yes Yes  Number falls in past yr: 1 1 1 1 1   Injury with Fall? Yes Yes Yes Yes Yes  Comment - - Hit her face and broke some teeth - -  Risk for fall due to : History of fall(s) History of fall(s);Impaired vision - Other (Comment) -  Risk for fall due to: Comment - walking down ramp and tripped over dog. Denied LOC; wears eyeglasses - - -  Follow up Falls prevention discussed Falls prevention discussed - Education provided Falls evaluation completed   Is the home free of loose throw rugs in walkways, pet beds, electrical cords, etc? Yes Adequate lighting to reduce risk of falls?  Yes In addition, does the patient have any of the following: Stairs in or around the home WITH handrails? Yes Grab bars in the bathroom? No  Shower chair or a place to sit while bathing? No Use of a cane, walker or w/c? No Use of an elevated toilet seat or a handicapped toilet? Yes  Timed Get Up and Go Performed: Yes. Pt ambulated 10 feet within 7 sec. Gait stead-fast and without the use of an assistive device. No intervention required at this time. Fall risk prevention has been discussed.  Community Resource Referral sent to Care Guide for installation of grab bars in the bathroom.  Depression Screen PHQ 2/9 Scores 05/18/2017 05/18/2017 04/17/2017 10/19/2016  PHQ - 2 Score 0 0 0 0  PHQ- 9 Score 0 - - -     Cognitive  Function     6CIT Screen 05/18/2017  What Year? 0 points  What month? 0 points  What time? 3 points  Count back from 20 0 points  Months in reverse 0 points  Repeat phrase 0 points  Total  Score 3    Immunization History  Administered Date(s) Administered  . Influenza, High Dose Seasonal PF 11/30/2014, 12/09/2015, 12/11/2016  . Influenza-Unspecified 10/28/2013  . Pneumococcal Conjugate-13 07/31/2014  . Pneumococcal Polysaccharide-23 08/11/2009  . Tdap 08/11/2009, 10/16/2016  . Zoster 12/19/2011    Qualifies for Shingles Vaccine? Yes. Zostavax completed 12/19/11. Due for Shingrix vaccine. Education has been provided regarding the importance of this vaccine. Pt has been advised to call her insurance company to determine her out of pocket expense. Advised she may also receive this vaccine at her local pharmacy or Health Dept. Verbalized acceptance and understanding.  Screening Tests Health Maintenance  Topic Date Due  . OPHTHALMOLOGY EXAM  04/17/2017  . HEMOGLOBIN A1C  10/15/2017  . FOOT EXAM  12/11/2017  . TETANUS/TDAP  10/17/2026  . INFLUENZA VACCINE  Completed  . DEXA SCAN  Completed  . PNA vac Low Risk Adult  Completed    Cancer Screenings: Colorectal Screening: Completed colonoscopy 11/06/16. No longer required. Mammogram: Completed 01/19/04. No longer required. Bone Density: Completed 07/19/16. Osteoporotic screening no longer required. Lung Cancer Screening: (Low Dose CT Chest recommended if Age 56-80 years, 30 pack-year currently smoking OR have quit w/in 15years.) Does not qualify. An Epic message has been sent to Burgess Estelle, RN (Oncology Nurse Navigator) regarding the possible need for this exam. Raquel Sarna will review the patient's chart to determine if the patient truly qualifies for the exam. If the patient qualifies, Raquel Sarna will order the Low Dose CT of the chest to facilitate the scheduling of this exam.  Additional Screening: Hepatitis C Screening: Does not  qualify  Diabetic Exams: Diabetic Eye Exam: Completed 04/17/16. Pt has been advised to call her ophthalmologist for completion. Insurance no longer covers [employer]. Pt has been provided with a list of providers to establish care with a new physician. Diabetic Foot Exam: Completed 12/11/16.  Vision and Dental Exams: Recommended annual ophthalmology exams for early detection of glaucoma and other disorders of the eye Recommended annual dental exams for proper oral hygiene    Plan:  I have personally reviewed and addressed the Medicare Annual Wellness questionnaire and have noted the following in the patient's chart:  A. Medical and social history B. Use of alcohol, tobacco or illicit drugs  C. Current medications and supplements D. Functional ability and status E.  Nutritional status F.  Physical activity G. Advance directives H. List of other physicians I.  Hospitalizations, surgeries, and ER visits in previous 12 months J.  [place] such as hearing and vision if needed, cognitive and depression L. Referrals and appointments - none  In addition, I have reviewed and discussed with patient certain preventive protocols, quality metrics, and best practice recommendations. A written personalized care plan for preventive services as well as general preventive health recommendations were provided to patient.  See attached scanned questionnaire for additional information.   Signed,  Aleatha Borer, LPN Nurse Health Advisor  I have reviewed this encounter including the documentation in this note and/or discussed this patient with the provider, Aleatha Borer, LPN. I am certifying that I agree with the content of this note as supervising physician.  Steele Sizer, MD [facility] 05/21/2017, 5:02 PM

## 2017-05-18 NOTE — Addendum Note (Signed)
Addended by: Hardie Pulley Aziz Slape J on: 05/18/2017 09:51 AM   Modules accepted: Orders, Level of Service, SmartSet

## 2017-05-21 DIAGNOSIS — E538 Deficiency of other specified B group vitamins: Secondary | ICD-10-CM | POA: Insufficient documentation

## 2017-05-22 ENCOUNTER — Inpatient Hospital Stay: Payer: Medicare HMO

## 2017-05-22 VITALS — BP 114/69 | HR 66 | Resp 18

## 2017-05-22 DIAGNOSIS — D508 Other iron deficiency anemias: Secondary | ICD-10-CM | POA: Diagnosis not present

## 2017-05-22 MED ORDER — SODIUM CHLORIDE 0.9 % IV SOLN
Freq: Once | INTRAVENOUS | Status: AC
  Administered 2017-05-22: 14:00:00 via INTRAVENOUS
  Filled 2017-05-22: qty 1000

## 2017-05-22 MED ORDER — IRON SUCROSE 20 MG/ML IV SOLN
200.0000 mg | Freq: Once | INTRAVENOUS | Status: AC
  Administered 2017-05-22: 200 mg via INTRAVENOUS
  Filled 2017-05-22: qty 10

## 2017-05-28 ENCOUNTER — Inpatient Hospital Stay: Payer: Medicare HMO | Attending: Oncology | Admitting: *Deleted

## 2017-05-28 DIAGNOSIS — Z7982 Long term (current) use of aspirin: Secondary | ICD-10-CM | POA: Diagnosis not present

## 2017-05-28 DIAGNOSIS — D500 Iron deficiency anemia secondary to blood loss (chronic): Secondary | ICD-10-CM | POA: Insufficient documentation

## 2017-05-28 DIAGNOSIS — N183 Chronic kidney disease, stage 3 (moderate): Secondary | ICD-10-CM | POA: Diagnosis not present

## 2017-05-28 DIAGNOSIS — E1122 Type 2 diabetes mellitus with diabetic chronic kidney disease: Secondary | ICD-10-CM | POA: Diagnosis not present

## 2017-05-28 DIAGNOSIS — Z950 Presence of cardiac pacemaker: Secondary | ICD-10-CM | POA: Insufficient documentation

## 2017-05-28 DIAGNOSIS — Z79899 Other long term (current) drug therapy: Secondary | ICD-10-CM | POA: Insufficient documentation

## 2017-05-28 DIAGNOSIS — I129 Hypertensive chronic kidney disease with stage 1 through stage 4 chronic kidney disease, or unspecified chronic kidney disease: Secondary | ICD-10-CM | POA: Diagnosis not present

## 2017-05-28 DIAGNOSIS — Z7984 Long term (current) use of oral hypoglycemic drugs: Secondary | ICD-10-CM | POA: Insufficient documentation

## 2017-05-28 DIAGNOSIS — D509 Iron deficiency anemia, unspecified: Secondary | ICD-10-CM

## 2017-05-28 LAB — CBC WITH DIFFERENTIAL/PLATELET
Basophils Absolute: 0.1 10*3/uL (ref 0–0.1)
Basophils Relative: 1 %
EOS PCT: 4 %
Eosinophils Absolute: 0.1 10*3/uL (ref 0–0.7)
HCT: 30.3 % — ABNORMAL LOW (ref 35.0–47.0)
HEMOGLOBIN: 10 g/dL — AB (ref 12.0–16.0)
LYMPHS ABS: 1.5 10*3/uL (ref 1.0–3.6)
LYMPHS PCT: 36 %
MCH: 28.1 pg (ref 26.0–34.0)
MCHC: 33.1 g/dL (ref 32.0–36.0)
MCV: 84.8 fL (ref 80.0–100.0)
Monocytes Absolute: 0.3 10*3/uL (ref 0.2–0.9)
Monocytes Relative: 7 %
NEUTROS PCT: 52 %
Neutro Abs: 2.2 10*3/uL (ref 1.4–6.5)
Platelets: 201 10*3/uL (ref 150–440)
RBC: 3.58 MIL/uL — AB (ref 3.80–5.20)
RDW: 19.5 % — ABNORMAL HIGH (ref 11.5–14.5)
WBC: 4.1 10*3/uL (ref 3.6–11.0)

## 2017-05-28 LAB — IRON AND TIBC
Iron: 67 ug/dL (ref 28–170)
Saturation Ratios: 18 % (ref 10.4–31.8)
TIBC: 373 ug/dL (ref 250–450)
UIBC: 306 ug/dL

## 2017-05-28 LAB — FERRITIN: Ferritin: 220 ng/mL (ref 11–307)

## 2017-05-29 ENCOUNTER — Encounter: Payer: Self-pay | Admitting: Oncology

## 2017-05-29 ENCOUNTER — Inpatient Hospital Stay (HOSPITAL_BASED_OUTPATIENT_CLINIC_OR_DEPARTMENT_OTHER): Payer: Medicare HMO | Admitting: Oncology

## 2017-05-29 ENCOUNTER — Other Ambulatory Visit: Payer: Self-pay

## 2017-05-29 ENCOUNTER — Inpatient Hospital Stay: Payer: Medicare HMO

## 2017-05-29 VITALS — BP 152/81 | HR 88 | Temp 97.1°F | Wt 127.5 lb

## 2017-05-29 DIAGNOSIS — Z7984 Long term (current) use of oral hypoglycemic drugs: Secondary | ICD-10-CM | POA: Diagnosis not present

## 2017-05-29 DIAGNOSIS — N183 Chronic kidney disease, stage 3 (moderate): Secondary | ICD-10-CM | POA: Diagnosis not present

## 2017-05-29 DIAGNOSIS — E1122 Type 2 diabetes mellitus with diabetic chronic kidney disease: Secondary | ICD-10-CM | POA: Diagnosis not present

## 2017-05-29 DIAGNOSIS — Z950 Presence of cardiac pacemaker: Secondary | ICD-10-CM

## 2017-05-29 DIAGNOSIS — Z79899 Other long term (current) drug therapy: Secondary | ICD-10-CM | POA: Diagnosis not present

## 2017-05-29 DIAGNOSIS — D500 Iron deficiency anemia secondary to blood loss (chronic): Secondary | ICD-10-CM | POA: Diagnosis not present

## 2017-05-29 DIAGNOSIS — Z7982 Long term (current) use of aspirin: Secondary | ICD-10-CM

## 2017-05-29 DIAGNOSIS — I129 Hypertensive chronic kidney disease with stage 1 through stage 4 chronic kidney disease, or unspecified chronic kidney disease: Secondary | ICD-10-CM | POA: Diagnosis not present

## 2017-05-29 NOTE — Progress Notes (Signed)
Hematology/Oncology Consult note [facility] Telephone:([phone] [phone]   Patient Care Team: Steele Sizer, MD as PCP - General (Family Medicine) Yolonda Kida, MD as Consulting Physician (Cardiology) Earlie Server, MD as Consulting Physician (Oncology)  REFERRING PROVIDER: Steele Sizer, MD CHIEF COMPLAINTS/PURPOSE OF CONSULTATION:  Evaluation of anemia  HISTORY OF PRESENTING ILLNESS:  Emma Campbell is a  84 y.o.  female with PMH listed below who was referred to me for evaluation of anemia.  Patient follows up with primary care physician and has had labs done recently. 04/17/17 CBC showed hemoglobin 9, MCV 81.9, platelet 245,000, WBC 4.1, normal differential.  Iron panel showed TIBC 442, ferritin 11, saturation 15%. Previous GI workup:  # 01/23/2017 EGD showed non bleeding angiotecsia, treated with APC. 12/22/2016 Capsule study showed duodenum AVM.  11/06/2016 colonoscopy showed non bleeding hemorroids, sigmoid polyp, and diverticulosis.   INTERVAL HISTORY Emma Campbell is a 84 y.o. female who has above history reviewed by me today presents for follow up visit for management of iron deficiency anemia.  During interval she has received IV Venofer x4 doses.  Patient reports energy is slightly better.  Review of Systems  Constitutional: Positive for malaise/fatigue. Negative for chills and fever.  HENT: Negative for nosebleeds.   Eyes: Negative for pain.  Respiratory: Negative for cough, sputum production and shortness of breath.   Cardiovascular: Negative for chest pain, orthopnea and claudication.  Gastrointestinal: Negative for abdominal pain, blood in stool, nausea and vomiting.  Genitourinary: Negative for dysuria and frequency.  Musculoskeletal: Negative for myalgias and neck pain.  Skin: Negative for itching and rash.  Neurological: Negative for dizziness, sensory change, weakness and headaches.  Endo/Heme/Allergies: Negative for  environmental allergies.  Psychiatric/Behavioral: Negative for depression, hallucinations and substance abuse.    MEDICAL HISTORY:  Past Medical History:  Diagnosis Date  . Anemia   . Chronic kidney disease, stage III (moderate) (HCC)   . Diverticulosis   . Elevated LFTs   . Essential hypertension, malignant   . Fibrocystic breast disease   . Heart murmur   . Hyperlipidemia   . Menopausal problem   . Osteoarthrosis   . Osteoporosis   . Peripheral autonomic neuropathy   . Sick sinus syndrome (HCC)    Dr. Alfredo Batty  . Type II diabetes mellitus with renal manifestations ([place])   . Vitamin D deficiency     SURGICAL HISTORY: Past Surgical History:  Procedure Laterality Date  . COLONOSCOPY WITH PROPOFOL N/A 11/06/2016   Procedure: COLONOSCOPY WITH PROPOFOL;  Surgeon: Lucilla Lame, MD;  Location: [place];  Service: Endoscopy;  Laterality: N/A;  DIABETIC  . ESOPHAGOGASTRODUODENOSCOPY (EGD) WITH PROPOFOL N/A 11/06/2016   Procedure: ESOPHAGOGASTRODUODENOSCOPY (EGD) WITH PROPOFOL;  Surgeon: Lucilla Lame, MD;  Location: [place];  Service: Endoscopy;  Laterality: N/A;  . ESOPHAGOGASTRODUODENOSCOPY (EGD) WITH PROPOFOL N/A 01/23/2017   Procedure: ESOPHAGOGASTRODUODENOSCOPY (EGD) WITH PROPOFOL with PUSH;  Surgeon: Lucilla Lame, MD;  Location: [facility] ENDOSCOPY;  Service: Endoscopy;  Laterality: N/A;  . GIVENS CAPSULE STUDY N/A 12/22/2016   Procedure: GIVENS CAPSULE STUDY;  Surgeon: Lucilla Lame, MD;  Location: [facility] ENDOSCOPY;  Service: Endoscopy;  Laterality: N/A;  . PACEMAKER INSERTION  2006  . POLYPECTOMY  11/06/2016   Procedure: POLYPECTOMY;  Surgeon: Lucilla Lame, MD;  Location: [place];  Service: Endoscopy;;    SOCIAL HISTORY: Social History   Socioeconomic History  . Marital status: Widowed    Spouse name: Shanon Brow  . Number of children: 1  .  Years of education: 2 years of college  . Highest education level: 12th grade  Occupational History    . Occupation: Retired Agricultural engineer at [employer]  . Financial resource strain: Not hard at all  . Food insecurity:    Worry: Never true    Inability: Never true  . Transportation needs:    Medical: No    Non-medical: No  Tobacco Use  . Smoking status: Never Smoker  . Smokeless tobacco: Never Used  . Tobacco comment: smoking cessation materials not required  Substance and Sexual Activity  . Alcohol use: No    Alcohol/week: 0.0 oz  . Drug use: No  . Sexual activity: Never  Lifestyle  . Physical activity:    Days per week: 7 days    Minutes per session: 30 min  . Stress: Not at all  Relationships  . Social connections:    Talks on phone: Patient refused    Gets together: Patient refused    Attends religious service: Patient refused    Active member of club or organization: Patient refused    Attends meetings of clubs or organizations: Patient refused    Relationship status: Widowed  . Intimate partner violence:    Fear of current or ex partner: No    Emotionally abused: No    Physically abused: No    Forced sexual activity: No  Other Topics Concern  . Not on file  Social History Narrative  . Not on file    FAMILY HISTORY: Family History  Problem Relation Age of Onset  . Hypertension Mother   . Stroke Mother   . Diabetes Daughter   . Stroke Father   . Healthy Sister   . Hypertension Maternal Grandmother     ALLERGIES:  is allergic to ace inhibitors.  MEDICATIONS:  Current Outpatient Medications  Medication Sig Dispense Refill  . amiodarone (PACERONE) 200 MG tablet Take 100 mg by mouth daily.    Marland Kitchen amLODipine-valsartan (EXFORGE) 5-320 MG tablet Take 1 tablet by mouth daily. 90 tablet 1  . aspirin 81 MG tablet Take 81 mg by mouth daily.    . cholecalciferol (VITAMIN D) 1000 UNITS tablet Take 200 Units by mouth daily.    . folic acid (FOLVITE) 1 MG tablet Take 1 mg by mouth daily.    . hydrochlorothiazide (HYDRODIURIL) 25 MG tablet Take 1  tablet (25 mg total) by mouth daily. 90 tablet 1  . metFORMIN (GLUCOPHAGE-XR) 750 MG 24 hr tablet Take 1 tablet (750 mg total) by mouth every evening. 90 tablet 1  . rosuvastatin (CRESTOR) 5 MG tablet Take 1 tablet (5 mg total) by mouth daily. 90 tablet 3   Current Facility-Administered Medications  Medication Dose Route Frequency Provider Last Rate Last Dose  . cyanocobalamin ((VITAMIN B-12)) injection 1,000 mcg  1,000 mcg Intramuscular Q30 days Steele Sizer, MD   1,000 mcg at 05/18/17 [id number]  . cyanocobalamin ((VITAMIN B-12)) injection 1,000 mcg  1,000 mcg Intramuscular Once Steele Sizer, MD         PHYSICAL EXAMINATION: ECOG PERFORMANCE STATUS: 1 - Symptomatic but completely ambulatory Vitals:   05/29/17 1105  BP: (!) 152/81  Pulse: 88  Temp: (!) 97.1 F (36.2 C)   Filed Weights   05/29/17 1105  Weight: 127 lb 8 oz (57.8 kg)    Physical Exam  Constitutional: She is oriented to person, place, and time and well-developed, well-nourished, and in no distress. No distress.  HENT:  Head: Normocephalic and atraumatic.  Mouth/Throat: Oropharynx is clear and moist. No oropharyngeal exudate.  Eyes: Pupils are equal, round, and reactive to light. EOM are normal. No scleral icterus.  Neck: Normal range of motion. Neck supple.  Cardiovascular: Normal rate. Exam reveals no friction rub.  No murmur heard. Pulmonary/Chest: Breath sounds normal. No respiratory distress.  Abdominal: Soft. Bowel sounds are normal. There is no tenderness. There is no guarding.  Musculoskeletal: Normal range of motion. She exhibits no edema.  Lymphadenopathy:    She has no cervical adenopathy.  Neurological: She is alert and oriented to person, place, and time. No cranial nerve deficit.  Skin: Skin is warm and dry. No erythema.  Psychiatric: Affect and judgment normal.     LABORATORY DATA:  I have reviewed the data as listed Lab Results  Component Value Date   WBC 4.1 05/28/2017   HGB 10.0 (L)  05/28/2017   HCT 30.3 (L) 05/28/2017   MCV 84.8 05/28/2017   PLT 201 05/28/2017   Recent Labs    12/11/16 1139  NA 139  K 4.2  CL 103  CO2 27  GLUCOSE 117*  BUN 15  CREATININE 0.95*  CALCIUM 9.1  GFRNONAA 56*  GFRAA 65  PROT 7.2  AST 15  ALT 14  BILITOT 0.3    Iron/TIBC/Ferritin/ %Sat    Component Value Date/Time   IRON 67 05/28/2017 1153   TIBC 373 05/28/2017 1153   FERRITIN 220 05/28/2017 1153   IRONPCTSAT 18 05/28/2017 1153   IRONPCTSAT 15 04/11/2016 1103     ASSESSMENT & PLAN:  1. Iron deficiency anemia due to chronic blood loss    # s/p IV iron with Venofer 200mg  twice a week for 4 doses.. Iron panel reviewed and consistent with replete of her iron store.  Hemoglobin has improved but has not normalized.  Her baseline is about hemoglobin of 11.  We will continue monitor her hemoglobin. At next visit plan to check SPEP to complete anemia workup. All questions were answered. The patient knows to call the clinic with any problems questions or concerns.  Return of visit: 8 weeks with repeat labs.  Thank you for this kind referral and the opportunity to participate in the care of this patient. A copy of today's note is routed to referring provider    Earlie Server, MD, PhD Hematology Oncology [facility] at [facility] Pager- [phone] 05/29/2017

## 2017-06-18 ENCOUNTER — Other Ambulatory Visit: Payer: Self-pay | Admitting: Family Medicine

## 2017-06-18 DIAGNOSIS — R809 Proteinuria, unspecified: Secondary | ICD-10-CM

## 2017-06-18 DIAGNOSIS — E1129 Type 2 diabetes mellitus with other diabetic kidney complication: Secondary | ICD-10-CM

## 2017-06-18 DIAGNOSIS — N182 Chronic kidney disease, stage 2 (mild): Secondary | ICD-10-CM

## 2017-06-18 DIAGNOSIS — E1122 Type 2 diabetes mellitus with diabetic chronic kidney disease: Secondary | ICD-10-CM

## 2017-06-18 DIAGNOSIS — I10 Essential (primary) hypertension: Secondary | ICD-10-CM

## 2017-06-19 ENCOUNTER — Other Ambulatory Visit: Payer: Self-pay | Admitting: Family Medicine

## 2017-06-19 DIAGNOSIS — E1122 Type 2 diabetes mellitus with diabetic chronic kidney disease: Secondary | ICD-10-CM

## 2017-06-19 DIAGNOSIS — N182 Chronic kidney disease, stage 2 (mild): Principal | ICD-10-CM

## 2017-07-12 ENCOUNTER — Ambulatory Visit (INDEPENDENT_AMBULATORY_CARE_PROVIDER_SITE_OTHER): Payer: Medicare HMO

## 2017-07-12 DIAGNOSIS — E538 Deficiency of other specified B group vitamins: Secondary | ICD-10-CM | POA: Diagnosis not present

## 2017-07-12 MED ORDER — CYANOCOBALAMIN 1000 MCG/ML IJ SOLN
1000.0000 ug | INTRAMUSCULAR | Status: DC
Start: 2017-07-12 — End: 2020-04-20
  Administered 2017-07-12 – 2018-07-08 (×7): 1000 ug via INTRAMUSCULAR

## 2017-07-30 ENCOUNTER — Inpatient Hospital Stay: Payer: Medicare HMO | Attending: Oncology

## 2017-07-30 DIAGNOSIS — Z79899 Other long term (current) drug therapy: Secondary | ICD-10-CM | POA: Diagnosis not present

## 2017-07-30 DIAGNOSIS — D500 Iron deficiency anemia secondary to blood loss (chronic): Secondary | ICD-10-CM | POA: Insufficient documentation

## 2017-07-30 DIAGNOSIS — Z7982 Long term (current) use of aspirin: Secondary | ICD-10-CM | POA: Insufficient documentation

## 2017-07-30 DIAGNOSIS — Z8249 Family history of ischemic heart disease and other diseases of the circulatory system: Secondary | ICD-10-CM | POA: Diagnosis not present

## 2017-07-30 DIAGNOSIS — N183 Chronic kidney disease, stage 3 (moderate): Secondary | ICD-10-CM | POA: Diagnosis not present

## 2017-07-30 DIAGNOSIS — E1122 Type 2 diabetes mellitus with diabetic chronic kidney disease: Secondary | ICD-10-CM | POA: Diagnosis not present

## 2017-07-30 DIAGNOSIS — Z7984 Long term (current) use of oral hypoglycemic drugs: Secondary | ICD-10-CM | POA: Diagnosis not present

## 2017-07-30 DIAGNOSIS — R5383 Other fatigue: Secondary | ICD-10-CM | POA: Diagnosis not present

## 2017-07-30 DIAGNOSIS — L509 Urticaria, unspecified: Secondary | ICD-10-CM | POA: Diagnosis not present

## 2017-07-30 LAB — RENAL FUNCTION PANEL
Albumin: 3.9 g/dL (ref 3.5–5.0)
Anion gap: 14 (ref 5–15)
BUN: 18 mg/dL (ref 6–20)
CHLORIDE: 101 mmol/L (ref 101–111)
CO2: 22 mmol/L (ref 22–32)
Calcium: 9 mg/dL (ref 8.9–10.3)
Creatinine, Ser: 1.01 mg/dL — ABNORMAL HIGH (ref 0.44–1.00)
GFR calc Af Amer: 58 mL/min — ABNORMAL LOW (ref >60)
GFR calc non Af Amer: 50 mL/min — ABNORMAL LOW (ref >60)
GLUCOSE: 107 mg/dL — AB (ref 65–99)
POTASSIUM: 3.7 mmol/L (ref 3.5–5.1)
Phosphorus: 3.3 mg/dL (ref 2.5–4.6)
Sodium: 137 mmol/L (ref 135–145)

## 2017-07-30 LAB — CBC WITH DIFFERENTIAL/PLATELET
BASOS ABS: 0 10*3/uL (ref 0–0.1)
BASOS PCT: 1 %
EOS PCT: 3 %
Eosinophils Absolute: 0.1 10*3/uL (ref 0–0.7)
HCT: 32.6 % — ABNORMAL LOW (ref 35.0–47.0)
Hemoglobin: 11 g/dL — ABNORMAL LOW (ref 12.0–16.0)
Lymphocytes Relative: 29 %
Lymphs Abs: 1.2 10*3/uL (ref 1.0–3.6)
MCH: 29.5 pg (ref 26.0–34.0)
MCHC: 33.7 g/dL (ref 32.0–36.0)
MCV: 87.7 fL (ref 80.0–100.0)
Monocytes Absolute: 0.4 10*3/uL (ref 0.2–0.9)
Monocytes Relative: 9 %
NEUTROS ABS: 2.4 10*3/uL (ref 1.4–6.5)
Neutrophils Relative %: 58 %
PLATELETS: 204 10*3/uL (ref 150–440)
RBC: 3.72 MIL/uL — AB (ref 3.80–5.20)
RDW: 16 % — ABNORMAL HIGH (ref 11.5–14.5)
WBC: 4.1 10*3/uL (ref 3.6–11.0)

## 2017-07-30 LAB — IRON AND TIBC
IRON: 51 ug/dL (ref 28–170)
Saturation Ratios: 14 % (ref 10.4–31.8)
TIBC: 368 ug/dL (ref 250–450)
UIBC: 318 ug/dL

## 2017-07-30 LAB — FERRITIN: FERRITIN: 45 ng/mL (ref 11–307)

## 2017-07-31 LAB — PROTEIN ELECTROPHORESIS, SERUM
A/G RATIO SPE: 1.3 (ref 0.7–1.7)
ALPHA-1-GLOBULIN: 0.3 g/dL (ref 0.0–0.4)
ALPHA-2-GLOBULIN: 1 g/dL (ref 0.4–1.0)
Albumin ELP: 4 g/dL (ref 2.9–4.4)
Beta Globulin: 1.1 g/dL (ref 0.7–1.3)
Gamma Globulin: 0.8 g/dL (ref 0.4–1.8)
Globulin, Total: 3.2 g/dL (ref 2.2–3.9)
TOTAL PROTEIN ELP: 7.2 g/dL (ref 6.0–8.5)

## 2017-08-06 ENCOUNTER — Other Ambulatory Visit: Payer: Self-pay

## 2017-08-06 ENCOUNTER — Encounter: Payer: Self-pay | Admitting: Oncology

## 2017-08-06 ENCOUNTER — Inpatient Hospital Stay (HOSPITAL_BASED_OUTPATIENT_CLINIC_OR_DEPARTMENT_OTHER): Payer: Medicare HMO | Admitting: Oncology

## 2017-08-06 ENCOUNTER — Inpatient Hospital Stay: Payer: Medicare HMO

## 2017-08-06 VITALS — BP 136/70 | HR 91 | Temp 97.6°F | Resp 18 | Wt 127.4 lb

## 2017-08-06 DIAGNOSIS — D500 Iron deficiency anemia secondary to blood loss (chronic): Secondary | ICD-10-CM | POA: Diagnosis not present

## 2017-08-06 DIAGNOSIS — Z79899 Other long term (current) drug therapy: Secondary | ICD-10-CM | POA: Diagnosis not present

## 2017-08-06 DIAGNOSIS — N183 Chronic kidney disease, stage 3 (moderate): Secondary | ICD-10-CM | POA: Diagnosis not present

## 2017-08-06 DIAGNOSIS — L509 Urticaria, unspecified: Secondary | ICD-10-CM

## 2017-08-06 DIAGNOSIS — Z8249 Family history of ischemic heart disease and other diseases of the circulatory system: Secondary | ICD-10-CM

## 2017-08-06 DIAGNOSIS — R5383 Other fatigue: Secondary | ICD-10-CM | POA: Diagnosis not present

## 2017-08-06 DIAGNOSIS — Z7984 Long term (current) use of oral hypoglycemic drugs: Secondary | ICD-10-CM

## 2017-08-06 DIAGNOSIS — Z7982 Long term (current) use of aspirin: Secondary | ICD-10-CM | POA: Diagnosis not present

## 2017-08-06 DIAGNOSIS — E1122 Type 2 diabetes mellitus with diabetic chronic kidney disease: Secondary | ICD-10-CM | POA: Diagnosis not present

## 2017-08-06 MED ORDER — FERROUS SULFATE 325 (65 FE) MG PO TBEC: 325.0000 mg | DELAYED_RELEASE_TABLET | Freq: Two times a day (BID) | ORAL | 3 refills | Status: DC

## 2017-08-06 MED ORDER — FERROUS SULFATE 325 (65 FE) MG PO TBEC: 325.0000 mg | DELAYED_RELEASE_TABLET | Freq: Three times a day (TID) | ORAL | 3 refills | Status: DC

## 2017-08-06 NOTE — Progress Notes (Signed)
Patient here for follow up. Concerned about rash all over upper body and legs. "It started out with a little red bump and then spread out, it itches." Unsure of cause.

## 2017-08-06 NOTE — Progress Notes (Signed)
Hematology/Oncology follow up note [facility] [phone] [phone]   Patient Care Team: Steele Sizer, MD as PCP - General (Family Medicine) Yolonda Kida, MD as Consulting Physician (Cardiology) Earlie Server, MD as Consulting Physician (Oncology)  REFERRING PROVIDER: Steele Sizer, MD REASON FOR VISIT Follow up for treatment of anemia  HISTORY OF PRESENTING ILLNESS:  Emma Campbell is a  84 y.o.  female with PMH listed below who was referred to me for evaluation of anemia.  Patient follows up with primary care physician and has had labs done recently. 04/17/17 CBC showed hemoglobin 9, MCV 81.9, platelet 245,000, WBC 4.1, normal differential.  Iron panel showed TIBC 442, ferritin 11, saturation 15%. Previous GI workup:  # 01/23/2017 EGD showed non bleeding angiotecsia, treated with APC. 12/22/2016 Capsule study showed duodenum AVM.  11/06/2016 colonoscopy showed non bleeding hemorroids, sigmoid polyp, and diverticulosis.   INTERVAL HISTORY Emma Campbell is a 84 y.o. female who has above history reviewed by me today presents for follow up visit for mangement of iron deficiency anemia.  Anemia: hemoglobin improved. Last hemoglobin 11.  Fatigue: improved.  Skin rash: this is a new problem. Reports developed skin "bumps" all over her body, fo a week,  acute onset, itchy, no aggravating factors, improved with taking allergy medication. Not associated with any other symptoms  Review of Systems  Constitutional: Positive for malaise/fatigue. Negative for chills, fever and weight loss.  HENT: Negative for congestion, ear discharge, ear pain, nosebleeds, sinus pain and sore throat.   Eyes: Negative for double vision, photophobia, pain, discharge and redness.  Respiratory: Negative for cough, hemoptysis, sputum production, shortness of breath and wheezing.   Cardiovascular: Negative for chest pain, palpitations, orthopnea, claudication and leg  swelling.  Gastrointestinal: Negative for abdominal pain, blood in stool, constipation, diarrhea, heartburn, melena, nausea and vomiting.  Genitourinary: Negative for dysuria, flank pain, frequency and hematuria.  Musculoskeletal: Negative for back pain, myalgias and neck pain.  Skin: Positive for rash. Negative for itching.  Neurological: Negative for dizziness, tingling, tremors, sensory change, focal weakness, weakness and headaches.  Endo/Heme/Allergies: Negative for environmental allergies. Does not bruise/bleed easily.  Psychiatric/Behavioral: Negative for depression, hallucinations and substance abuse. The patient is not nervous/anxious.     MEDICAL HISTORY:  Past Medical History:  Diagnosis Date  . Anemia   . Chronic kidney disease, stage III (moderate) (HCC)   . Diverticulosis   . Elevated LFTs   . Essential hypertension, malignant   . Fibrocystic breast disease   . Heart murmur   . Hyperlipidemia   . Menopausal problem   . Osteoarthrosis   . Osteoporosis   . Peripheral autonomic neuropathy   . Sick sinus syndrome (HCC)    Dr. Alfredo Batty  . Type II diabetes mellitus with renal manifestations ([place])   . Vitamin D deficiency     SURGICAL HISTORY: Past Surgical History:  Procedure Laterality Date  . COLONOSCOPY WITH PROPOFOL N/A 11/06/2016   Procedure: COLONOSCOPY WITH PROPOFOL;  Surgeon: Lucilla Lame, MD;  Location: [place];  Service: Endoscopy;  Laterality: N/A;  DIABETIC  . ESOPHAGOGASTRODUODENOSCOPY (EGD) WITH PROPOFOL N/A 11/06/2016   Procedure: ESOPHAGOGASTRODUODENOSCOPY (EGD) WITH PROPOFOL;  Surgeon: Lucilla Lame, MD;  Location: [place];  Service: Endoscopy;  Laterality: N/A;  . ESOPHAGOGASTRODUODENOSCOPY (EGD) WITH PROPOFOL N/A 01/23/2017   Procedure: ESOPHAGOGASTRODUODENOSCOPY (EGD) WITH PROPOFOL with PUSH;  Surgeon: Lucilla Lame, MD;  Location: [facility] ENDOSCOPY;  Service: Endoscopy;  Laterality: N/A;  . GIVENS CAPSULE STUDY N/A  12/22/2016   Procedure: GIVENS CAPSULE STUDY;  Surgeon: Lucilla Lame, MD;  Location: [facility] ENDOSCOPY;  Service: Endoscopy;  Laterality: N/A;  . PACEMAKER INSERTION  2006  . POLYPECTOMY  11/06/2016   Procedure: POLYPECTOMY;  Surgeon: Lucilla Lame, MD;  Location: [place];  Service: Endoscopy;;    SOCIAL HISTORY: Social History   Socioeconomic History  . Marital status: Widowed    Spouse name: Shanon Brow  . Number of children: 1  . Years of education: 2 years of college  . Highest education level: 12th grade  Occupational History  . Occupation: Retired Agricultural engineer at [employer]  . Financial resource strain: Not hard at all  . Food insecurity:    Worry: Never true    Inability: Never true  . Transportation needs:    Medical: No    Non-medical: No  Tobacco Use  . Smoking status: Never Smoker  . Smokeless tobacco: Never Used  . Tobacco comment: smoking cessation materials not required  Substance and Sexual Activity  . Alcohol use: No    Alcohol/week: 0.0 oz  . Drug use: No  . Sexual activity: Never  Lifestyle  . Physical activity:    Days per week: 7 days    Minutes per session: 30 min  . Stress: Not at all  Relationships  . Social connections:    Talks on phone: Patient refused    Gets together: Patient refused    Attends religious service: Patient refused    Active member of club or organization: Patient refused    Attends meetings of clubs or organizations: Patient refused    Relationship status: Widowed  . Intimate partner violence:    Fear of current or ex partner: No    Emotionally abused: No    Physically abused: No    Forced sexual activity: No  Other Topics Concern  . Not on file  Social History Narrative  . Not on file    FAMILY HISTORY: Family History  Problem Relation Age of Onset  . Hypertension Mother   . Stroke Mother   . Diabetes Daughter   . Stroke Father   . Healthy Sister   . Hypertension Maternal Grandmother      ALLERGIES:  is allergic to ace inhibitors.  MEDICATIONS:  Current Outpatient Medications  Medication Sig Dispense Refill  . amiodarone (PACERONE) 200 MG tablet Take 100 mg by mouth daily.    Marland Kitchen amLODipine-valsartan (EXFORGE) 5-320 MG tablet TAKE 1 TABLET EVERY DAY 90 tablet 1  . aspirin 81 MG tablet Take 81 mg by mouth daily.    . cholecalciferol (VITAMIN D) 1000 UNITS tablet Take 200 Units by mouth daily.    . folic acid (FOLVITE) 893 MCG tablet Take 400 mcg by mouth daily.    . hydrochlorothiazide (HYDRODIURIL) 25 MG tablet TAKE 1 TABLET EVERY DAY 90 tablet 1  . metFORMIN (GLUCOPHAGE-XR) 750 MG 24 hr tablet TAKE 1 TABLET EVERY EVENING 90 tablet 1  . rosuvastatin (CRESTOR) 5 MG tablet Take 1 tablet (5 mg total) by mouth daily. 90 tablet 3  . ferrous sulfate 325 (65 FE) MG EC tablet Take 1 tablet (325 mg total) by mouth 3 (three) times daily with meals. 60 tablet 3   Current Facility-Administered Medications  Medication Dose Route Frequency Provider Last Rate Last Dose  . cyanocobalamin ((VITAMIN B-12)) injection 1,000 mcg  1,000 mcg Intramuscular Q30 days Steele Sizer, MD   1,000 mcg at 07/12/17 1435     PHYSICAL EXAMINATION:  ECOG PERFORMANCE STATUS: 1 - Symptomatic but completely ambulatory Vitals:   08/06/17 1333  BP: 136/70  Pulse: 91  Resp: 18  Temp: 97.6 F (36.4 C)   Filed Weights   08/06/17 1333  Weight: 127 lb 6.4 oz (57.8 kg)    Physical Exam  Constitutional: She is oriented to person, place, and time and well-developed, well-nourished, and in no distress. No distress.  HENT:  Head: Normocephalic and atraumatic.  Nose: Nose normal.  Mouth/Throat: Oropharynx is clear and moist. No oropharyngeal exudate.  Eyes: Pupils are equal, round, and reactive to light. EOM are normal. No scleral icterus.  Neck: Normal range of motion. Neck supple. No JVD present.  Cardiovascular: Normal rate, regular rhythm and normal heart sounds. Exam reveals no friction rub.  No  murmur heard. Pulmonary/Chest: Effort normal and breath sounds normal. No respiratory distress. She has no wheezes. She has no rales. She exhibits no tenderness.  Abdominal: Soft. Bowel sounds are normal. She exhibits no distension and no mass. There is no tenderness. There is no rebound and no guarding.  Musculoskeletal: Normal range of motion. She exhibits no edema or tenderness.  Lymphadenopathy:    She has no cervical adenopathy.  Neurological: She is alert and oriented to person, place, and time. No cranial nerve deficit. She exhibits normal muscle tone. Coordination normal.  Skin: Skin is warm and dry. Rash noted. She is not diaphoretic. No erythema.  A few red, raised brownish rash on trunk and thigh.   Psychiatric: Affect and judgment normal.     LABORATORY DATA:  I have reviewed the data as listed Lab Results  Component Value Date   WBC 4.1 07/30/2017   HGB 11.0 (L) 07/30/2017   HCT 32.6 (L) 07/30/2017   MCV 87.7 07/30/2017   PLT 204 07/30/2017   Recent Labs    12/11/16 1139 07/30/17 1100  NA 139 137  K 4.2 3.7  CL 103 101  CO2 27 22  GLUCOSE 117* 107*  BUN 15 18  CREATININE 0.95* 1.01*  CALCIUM 9.1 9.0  GFRNONAA 56* 50*  GFRAA 65 58*  PROT 7.2  --   ALBUMIN  --  3.9  AST 15  --   ALT 14  --   BILITOT 0.3  --     Iron/TIBC/Ferritin/ %Sat    Component Value Date/Time   IRON 51 07/30/2017 1100   TIBC 368 07/30/2017 1100   FERRITIN 45 07/30/2017 1100   IRONPCTSAT 14 07/30/2017 1100   IRONPCTSAT 15 04/11/2016 1103   SPEP not observed M spike.   ASSESSMENT & PLAN:  1. Iron deficiency anemia due to chronic blood loss   2. Hives    # Labs discussed with patient. Hemoglobin stable.  Iron panel acceptable, iron saturation 14, ferritin at 45, decreased from 2 months ago.  ? Ongoing blood loss.  I recommend patient to start taking ferrous sulfate 325mg  BID. Use Stool softner as needed if iron supplements cause constipation. Repeat cbc, iron TIBC ferritin  in 3 months.   # Hives: advise patient to continue taking allergy medication..  Orders Placed This Encounter  Procedures  . CBC with Differential/Platelet    Standing Status:   Future    Standing Expiration Date:   08/07/2018  . Iron and TIBC    Standing Status:   Future    Standing Expiration Date:   08/07/2018  . Ferritin    Standing Status:   Future    Standing Expiration Date:   08/07/2018  All questions were answered. The patient knows to call the clinic with any problems questions or concerns.  Return of visit: 3 months with repeat labs.  Thank you for this kind referral and the opportunity to participate in the care of this patient. A copy of today's note is routed to referring provider    Earlie Server, MD, PhD Hematology Oncology [facility] at [facility] Pager- [phone] 08/06/2017

## 2017-08-15 ENCOUNTER — Ambulatory Visit (INDEPENDENT_AMBULATORY_CARE_PROVIDER_SITE_OTHER): Payer: Medicare HMO | Admitting: Family Medicine

## 2017-08-15 ENCOUNTER — Encounter: Payer: Self-pay | Admitting: Family Medicine

## 2017-08-15 VITALS — BP 124/68 | HR 75 | Temp 97.8°F | Resp 16 | Ht 61.0 in | Wt 127.0 lb

## 2017-08-15 DIAGNOSIS — N183 Chronic kidney disease, stage 3 unspecified: Secondary | ICD-10-CM

## 2017-08-15 DIAGNOSIS — E538 Deficiency of other specified B group vitamins: Secondary | ICD-10-CM

## 2017-08-15 DIAGNOSIS — D500 Iron deficiency anemia secondary to blood loss (chronic): Secondary | ICD-10-CM | POA: Diagnosis not present

## 2017-08-15 DIAGNOSIS — Z950 Presence of cardiac pacemaker: Secondary | ICD-10-CM

## 2017-08-15 DIAGNOSIS — N182 Chronic kidney disease, stage 2 (mild): Secondary | ICD-10-CM

## 2017-08-15 DIAGNOSIS — I10 Essential (primary) hypertension: Secondary | ICD-10-CM

## 2017-08-15 DIAGNOSIS — R809 Proteinuria, unspecified: Secondary | ICD-10-CM

## 2017-08-15 DIAGNOSIS — E785 Hyperlipidemia, unspecified: Secondary | ICD-10-CM

## 2017-08-15 DIAGNOSIS — E1122 Type 2 diabetes mellitus with diabetic chronic kidney disease: Secondary | ICD-10-CM

## 2017-08-15 DIAGNOSIS — I480 Paroxysmal atrial fibrillation: Secondary | ICD-10-CM | POA: Diagnosis not present

## 2017-08-15 LAB — POCT GLYCOSYLATED HEMOGLOBIN (HGB A1C): HbA1c, POC (prediabetic range): 6.4 % (ref 5.7–6.4)

## 2017-08-15 LAB — POCT UA - MICROALBUMIN: Microalbumin Ur, POC: 20 mg/L

## 2017-08-15 MED ORDER — FERROUS SULFATE 325 (65 FE) MG PO TBEC: 325.0000 mg | DELAYED_RELEASE_TABLET | Freq: Two times a day (BID) | ORAL | 3 refills | Status: DC

## 2017-08-15 NOTE — Progress Notes (Signed)
Name: Emma Campbell   MRN: [id number]    DOB: [birth year]   Date:08/15/2017       Progress Note  Subjective  Chief Complaint  Chief Complaint  Patient presents with  . Medication Refill  . Diabetes    Checks every couple of days, Lowest-120 Highest-141  . Hypertension    Denies any symptoms  . Hyperlipidemia  . Anemia  . Osteopenia    HPI   DMII with renal manifestation: CKI and microalbuminuria. HgbA1C has been stable. She denies hypoglycemia episodes. She feels well. Glucose at home is getting checked a couple of times weekly and is low 124,  highest lately 141 She is complaint with her diet. She denies polyphagia, polydipsia or polyuria ( except right after she takes HCTZ) . Due for repeat eye exam. Continue ARB for kidney protection. Urine micro is back to normal, but GFR has dropped a little and it is CKI stage III now  HTN: taking medication daily and bp is at goal, no dizziness, chest pain or palpitation. Unchanged.  Hyperlipidemia: taking Crestor5 mg and denies myalgias. Reviewed last labs and they were at goal  Sick Sinus Syndrome: she had a pace maker placed in 2006 and is taking Amiodarone since 2006, she is now down to 100 mg of Amiodarone daily, last TSH was back to normal we will recheck today no chest pain or palpitation, no syncope ( the fall was secondary to tripping on dog's leash)  Sees Dr. Clayborn Bigness , she is [name] score of 2, and is on aspirin only without problems. She is at a high risk of falls because of age and does not want anti-coagulant  Vitamin D deficiency: still taking supplementation, denies fatigue.  Osteopenia: used to take Fosamax but has been off and last bone density reviewed with patient, low FRAX score, continue high calcium diet and vitamin D supplementation Unchanged   Anemia: iron deficiency and positive hemoccult, seen by Dr. Durwin Reges and had EGD and colonoscopy September 2018,diagnosed with AVM small bowel and had it cauterized, hemoglobin  dropped and was referred to Dr. Tasia Catchings, labs are being monitored by her.   B12 deficiency: getting supplementation   Patient Active Problem List   Diagnosis Date Noted  . B12 deficiency 05/21/2017  . Gastrointestinal tract imaging abnormality   . Iron deficiency anemia   . Polyp of sigmoid colon   . Type 2 diabetes mellitus with stage 2 chronic kidney disease, without long-term current use of insulin ([place]) 08/03/2015  . Arthritis, degenerative 05/03/2015  . Menopausal and perimenopausal disorder 05/03/2015  . Chronic kidney disease (CKD), stage III (moderate) ([place]) 11/30/2014  . High risk medication use 11/30/2014  . Diverticulosis 11/30/2014  . Fibrocystic breast disease 11/30/2014  . Elevated LFTs 11/30/2014  . Systolic ejection murmur 35/59/7416  . Paroxysmal A-fib ([place]) 11/30/2014  . Microalbuminuria 07/31/2014  . Elevated TSH 07/31/2014  . History of cardiac pacemaker 07/31/2014  . Sick sinus syndrome ([place]) 08/13/2013  . Hypercholesteremia 08/13/2013  . Hypertension, benign 08/13/2013  . Diabetes mellitus with renal manifestations, controlled ([place]) 08/13/2013  . Avitaminosis D 04/12/2009    Past Surgical History:  Procedure Laterality Date  . COLONOSCOPY WITH PROPOFOL N/A 11/06/2016   Procedure: COLONOSCOPY WITH PROPOFOL;  Surgeon: Lucilla Lame, MD;  Location: [facility];  Service: Endoscopy;  Laterality: N/A;  DIABETIC  . ESOPHAGOGASTRODUODENOSCOPY (EGD) WITH PROPOFOL N/A 11/06/2016   Procedure: ESOPHAGOGASTRODUODENOSCOPY (EGD) WITH PROPOFOL;  Surgeon: Lucilla Lame, MD;  Location: [facility];  Service: Endoscopy;  Laterality: N/A;  . ESOPHAGOGASTRODUODENOSCOPY (EGD) WITH PROPOFOL N/A 01/23/2017   Procedure: ESOPHAGOGASTRODUODENOSCOPY (EGD) WITH PROPOFOL with PUSH;  Surgeon: Lucilla Lame, MD;  Location: [facility] ENDOSCOPY;  Service: Endoscopy;  Laterality: N/A;  . GIVENS CAPSULE STUDY N/A 12/22/2016   Procedure: GIVENS CAPSULE STUDY;  Surgeon: Lucilla Lame, MD;   Location: [facility] ENDOSCOPY;  Service: Endoscopy;  Laterality: N/A;  . PACEMAKER INSERTION  2006  . POLYPECTOMY  11/06/2016   Procedure: POLYPECTOMY;  Surgeon: Lucilla Lame, MD;  Location: [facility];  Service: Endoscopy;;    Family History  Problem Relation Age of Onset  . Hypertension Mother   . Stroke Mother   . Diabetes Daughter   . Stroke Father   . Healthy Sister   . Hypertension Maternal Grandmother     Social History   Socioeconomic History  . Marital status: Widowed    Spouse name: Shanon Brow  . Number of children: 1  . Years of education: 2 years of college  . Highest education level: 12th grade  Occupational History  . Occupation: Retired Agricultural engineer at [employer]  . Financial resource strain: Not hard at all  . Food insecurity:    Worry: Never true    Inability: Never true  . Transportation needs:    Medical: No    Non-medical: No  Tobacco Use  . Smoking status: Never Smoker  . Smokeless tobacco: Never Used  . Tobacco comment: smoking cessation materials not required  Substance and Sexual Activity  . Alcohol use: No    Alcohol/week: 0.0 oz  . Drug use: No  . Sexual activity: Never  Lifestyle  . Physical activity:    Days per week: 7 days    Minutes per session: 30 min  . Stress: Not at all  Relationships  . Social connections:    Talks on phone: Patient refused    Gets together: Patient refused    Attends religious service: Patient refused    Active member of club or organization: Patient refused    Attends meetings of clubs or organizations: Patient refused    Relationship status: Widowed  . Intimate partner violence:    Fear of current or ex partner: No    Emotionally abused: No    Physically abused: No    Forced sexual activity: No  Other Topics Concern  . Not on file  Social History Narrative  . Not on file     Current Outpatient Medications:  .  amiodarone (PACERONE) 200 MG tablet, Take 100 mg by mouth daily.,  Disp: , Rfl:  .  amLODipine-valsartan (EXFORGE) 5-320 MG tablet, TAKE 1 TABLET EVERY DAY, Disp: 90 tablet, Rfl: 1 .  aspirin 81 MG tablet, Take 81 mg by mouth daily., Disp: , Rfl:  .  cholecalciferol (VITAMIN D) 1000 UNITS tablet, Take 200 Units by mouth daily., Disp: , Rfl:  .  ferrous sulfate 325 (65 FE) MG EC tablet, Take 1 tablet (325 mg total) by mouth 2 (two) times daily with a meal., Disp: 60 tablet, Rfl: 3 .  folic acid (FOLVITE) 294 MCG tablet, Take 400 mcg by mouth daily., Disp: , Rfl:  .  hydrochlorothiazide (HYDRODIURIL) 25 MG tablet, TAKE 1 TABLET EVERY DAY, Disp: 90 tablet, Rfl: 1 .  metFORMIN (GLUCOPHAGE-XR) 750 MG 24 hr tablet, TAKE 1 TABLET EVERY EVENING, Disp: 90 tablet, Rfl: 1 .  rosuvastatin (CRESTOR) 5 MG tablet, Take 1 tablet (5 mg total) by mouth daily., Disp: 90 tablet, Rfl: 3  Current  Facility-Administered Medications:  .  cyanocobalamin ((VITAMIN B-12)) injection 1,000 mcg, 1,000 mcg, Intramuscular, Q30 days, Steele Sizer, MD, 1,000 mcg at 08/15/17 1032  Allergies  Allergen Reactions  . Ace Inhibitors Other (See Comments)    Pt unable to recall. Was advised to avoid     ROS  Constitutional: Negative for fever or weight change.  Respiratory: Negative for cough and shortness of breath.   Cardiovascular: Negative for chest pain or palpitations.  Gastrointestinal: Negative for abdominal pain, no bowel changes.  Musculoskeletal: Negative for gait problem or joint swelling.  Skin: Negative for rash.  Neurological: Negative for dizziness or headache.  No other specific complaints in a complete review of systems (except as listed in HPI above).  Objective  Vitals:   08/15/17 1024  BP: 124/68  Pulse: 75  Resp: 16  Temp: 97.8 F (36.6 C)  TempSrc: Oral  SpO2: 97%  Weight: 127 lb (57.6 kg)  Height: '5\' 1"'$  (1.549 m)    Body mass index is 24 kg/m.  Physical Exam  Constitutional: Patient appears well-developed and well-nourished.  No distress.  HEENT:  head atraumatic, normocephalic, pupils equal and reactive to light,  neck supple, throat within normal limits Cardiovascular: Normal rate, regular rhythm and normal heart sounds.  No murmur heard. No BLE edema. Pulmonary/Chest: Effort normal and breath sounds normal. No respiratory distress. Abdominal: Soft.  There is no tenderness. Psychiatric: Patient has a normal mood and affect. behavior is normal. Judgment and thought content normal.  Recent Results (from the past 2160 hour(s))  Iron and TIBC     Status: None   Collection Time: 05/28/17 11:53 AM  Result Value Ref Range   Iron 67 28 - 170 ug/dL   TIBC 373 250 - 450 ug/dL   Saturation Ratios 18 10.4 - 31.8 %   UIBC 306 ug/dL    Comment: Performed at [facility], [address] [place], [place] [address]  Ferritin     Status: None   Collection Time: 05/28/17 11:53 AM  Result Value Ref Range   Ferritin 220 11 - 307 ng/mL    Comment: Performed at [facility], [address] [place], [place] [address]  CBC with Differential/Platelet     Status: Abnormal   Collection Time: 05/28/17 11:53 AM  Result Value Ref Range   WBC 4.1 3.6 - 11.0 K/uL   RBC 3.58 (L) 3.80 - 5.20 MIL/uL   Hemoglobin 10.0 (L) 12.0 - 16.0 g/dL   HCT 30.3 (L) 35.0 - 47.0 %   MCV 84.8 80.0 - 100.0 fL   MCH 28.1 26.0 - 34.0 pg   MCHC 33.1 32.0 - 36.0 g/dL   RDW 19.5 (H) 11.5 - 14.5 %   Platelets 201 150 - 440 K/uL   Neutrophils Relative % 52 %   Neutro Abs 2.2 1.4 - 6.5 K/uL   Lymphocytes Relative 36 %   Lymphs Abs 1.5 1.0 - 3.6 K/uL   Monocytes Relative 7 %   Monocytes Absolute 0.3 0.2 - 0.9 K/uL   Eosinophils Relative 4 %   Eosinophils Absolute 0.1 0 - 0.7 K/uL   Basophils Relative 1 %   Basophils Absolute 0.1 0 - 0.1 K/uL    Comment: Performed at [facility], [place], [place] [address]  Iron and TIBC     Status: None   Collection Time: 07/30/17 11:00 AM  Result Value Ref Range   Iron 51 28 - 170 ug/dL    TIBC  368 250 - 450 ug/dL   Saturation Ratios 14 10.4 - 31.8 %   UIBC 318 ug/dL    Comment: Performed at [facility], [address] [place], [place] [address]  Renal function panel     Status: Abnormal   Collection Time: 07/30/17 11:00 AM  Result Value Ref Range   Sodium 137 135 - 145 mmol/L   Potassium 3.7 3.5 - 5.1 mmol/L   Chloride 101 101 - 111 mmol/L   CO2 22 22 - 32 mmol/L   Glucose, Bld 107 (H) 65 - 99 mg/dL   BUN 18 6 - 20 mg/dL   Creatinine, Ser 1.01 (H) 0.44 - 1.00 mg/dL   Calcium 9.0 8.9 - 10.3 mg/dL   Phosphorus 3.3 2.5 - 4.6 mg/dL   Albumin 3.9 3.5 - 5.0 g/dL   GFR calc non Af Amer 50 (L) >60 mL/min   GFR calc Af Amer 58 (L) >60 mL/min    Comment: (NOTE) The eGFR has been calculated using the CKD EPI equation. This calculation has not been validated in all clinical situations. eGFR's persistently <60 mL/min signify possible Chronic Kidney Disease.    Anion gap 14 5 - 15    Comment: Performed at [facility], [address] [place], [place] [address]  CBC with Differential/Platelet     Status: Abnormal   Collection Time: 07/30/17 11:00 AM  Result Value Ref Range   WBC 4.1 3.6 - 11.0 K/uL   RBC 3.72 (L) 3.80 - 5.20 MIL/uL   Hemoglobin 11.0 (L) 12.0 - 16.0 g/dL   HCT 32.6 (L) 35.0 - 47.0 %   MCV 87.7 80.0 - 100.0 fL   MCH 29.5 26.0 - 34.0 pg   MCHC 33.7 32.0 - 36.0 g/dL   RDW 16.0 (H) 11.5 - 14.5 %   Platelets 204 150 - 440 K/uL   Neutrophils Relative % 58 %   Neutro Abs 2.4 1.4 - 6.5 K/uL   Lymphocytes Relative 29 %   Lymphs Abs 1.2 1.0 - 3.6 K/uL   Monocytes Relative 9 %   Monocytes Absolute 0.4 0.2 - 0.9 K/uL   Eosinophils Relative 3 %   Eosinophils Absolute 0.1 0 - 0.7 K/uL   Basophils Relative 1 %   Basophils Absolute 0.0 0 - 0.1 K/uL    Comment: Performed at [facility], [address] [place], [place] [address]  Ferritin     Status: None   Collection Time: 07/30/17 11:00 AM  Result Value Ref Range   Ferritin 45 11 -  307 ng/mL    Comment: Performed at [facility], [address] [place], [place] [address]  Protein electrophoresis, serum     Status: None   Collection Time: 07/30/17 11:04 AM  Result Value Ref Range   Total Protein ELP 7.2 6.0 - 8.5 g/dL   Albumin ELP 4.0 2.9 - 4.4 g/dL   Alpha-1-Globulin 0.3 0.0 - 0.4 g/dL   Alpha-2-Globulin 1.0 0.4 - 1.0 g/dL   Beta Globulin 1.1 0.7 - 1.3 g/dL   Gamma Globulin 0.8 0.4 - 1.8 g/dL   M-Spike, % Not Observed Not Observed g/dL   SPE Interp. Comment     Comment: (NOTE) The SPE pattern appears essentially unremarkable. Evidence of monoclonal protein is not apparent. Performed At: [facility] [place], [place] [id number] Rush Farmer MD NO:[id number]    Comment Comment     Comment: (NOTE) Protein electrophoresis scan will follow via computer, mail, or courier  delivery.    GLOBULIN, TOTAL 3.2 2.2 - 3.9 g/dL   A/G Ratio 1.3 0.7 - 1.7    Comment: Performed at [facility], [address] [place], [place] [address]  POCT HgB A1C     Status: None   Collection Time: 08/15/17 10:23 AM  Result Value Ref Range   Hemoglobin A1C  4.0 - 5.6 %   HbA1c, POC (prediabetic range) 6.4 5.7 - 6.4 %   HbA1c, POC (controlled diabetic range)  0.0 - 7.0 %  POCT UA - Microalbumin     Status: None   Collection Time: 08/15/17 10:23 AM  Result Value Ref Range   Microalbumin Ur, POC 20 mg/L   Creatinine, POC  mg/dL   Albumin/Creatinine Ratio, Urine, POC        PHQ2/9: Depression screen [facility] 2/9 08/15/2017 05/18/2017 05/18/2017 04/17/2017 10/19/2016  Decreased Interest 0 0 0 0 0  Down, Depressed, Hopeless 0 0 0 0 0  PHQ - 2 Score 0 0 0 0 0  Altered sleeping - 0 - - -  Tired, decreased energy - 0 - - -  Change in appetite - 0 - - -  Feeling bad or failure about yourself  - 0 - - -  Trouble concentrating - 0 - - -  Moving slowly or fidgety/restless - 0 - - -  Suicidal thoughts - 0 - - -  PHQ-9 Score - 0 - - -  Difficult  doing work/chores - Not difficult at all - - -    Fall Risk: Fall Risk  08/15/2017 05/18/2017 05/18/2017 04/17/2017 12/11/2016  Falls in the past year? No Yes Yes Yes Yes  Number falls in past yr: - _0 Injury with Fall? - Yes Yes Yes Yes  Comment - - - Hit her face and broke some teeth -  Risk for fall due to : - History of fall(s) History of fall(s);Impaired vision - Other (Comment)  Risk for fall due to: Comment - - walking down ramp and tripped over dog. Denied LOC; wears eyeglasses - -  Follow up - Falls prevention discussed Falls prevention discussed - Education provided    Functional Status Survey: Is the patient deaf or have difficulty hearing?: No Does the patient have difficulty seeing, even when wearing glasses/contacts?: Yes(glasses) Does the patient have difficulty concentrating, remembering, or making decisions?: No Does the patient have difficulty walking or climbing stairs?: No Does the patient have difficulty dressing or bathing?: No Does the patient have difficulty doing errands alone such as visiting a doctor's office or shopping?: No    Assessment & Plan  1. Type 2 diabetes mellitus with stage 3 chronic kidney disease, without long-term current use of insulin (H[facility])  - POCT HgB A1C - POCT UA - Microalbumin  2. Chronic kidney disease, stage III (moderate) ([place])  Recheck labs next visit   3. Paroxysmal A-fib ([facility])  Doing well, on aspirin and rate controlled  4. Dyslipidemia   5. B12 deficiency  Continue supplementation   6. Iron deficiency anemia due to chronic blood loss  Keep follow up with hematologist   7. History of cardiac pacemaker   8. Hypertension, benign  At goal   9. Microalbuminuria  On ARB

## 2017-08-22 DIAGNOSIS — I480 Paroxysmal atrial fibrillation: Secondary | ICD-10-CM | POA: Diagnosis not present

## 2017-08-22 DIAGNOSIS — I495 Sick sinus syndrome: Secondary | ICD-10-CM | POA: Diagnosis not present

## 2017-08-22 DIAGNOSIS — E785 Hyperlipidemia, unspecified: Secondary | ICD-10-CM | POA: Diagnosis not present

## 2017-08-22 DIAGNOSIS — R011 Cardiac murmur, unspecified: Secondary | ICD-10-CM | POA: Diagnosis not present

## 2017-08-22 DIAGNOSIS — R001 Bradycardia, unspecified: Secondary | ICD-10-CM | POA: Diagnosis not present

## 2017-08-22 DIAGNOSIS — I10 Essential (primary) hypertension: Secondary | ICD-10-CM | POA: Diagnosis not present

## 2017-08-22 DIAGNOSIS — M1990 Unspecified osteoarthritis, unspecified site: Secondary | ICD-10-CM | POA: Diagnosis not present

## 2017-08-22 DIAGNOSIS — E119 Type 2 diabetes mellitus without complications: Secondary | ICD-10-CM | POA: Diagnosis not present

## 2017-09-18 ENCOUNTER — Ambulatory Visit (INDEPENDENT_AMBULATORY_CARE_PROVIDER_SITE_OTHER): Payer: Medicare HMO | Admitting: Emergency Medicine

## 2017-09-18 DIAGNOSIS — E538 Deficiency of other specified B group vitamins: Secondary | ICD-10-CM | POA: Diagnosis not present

## 2017-10-02 ENCOUNTER — Ambulatory Visit: Payer: Medicare HMO

## 2017-10-17 ENCOUNTER — Ambulatory Visit (INDEPENDENT_AMBULATORY_CARE_PROVIDER_SITE_OTHER): Payer: Medicare HMO

## 2017-10-17 DIAGNOSIS — E538 Deficiency of other specified B group vitamins: Secondary | ICD-10-CM

## 2017-10-17 MED ORDER — CYANOCOBALAMIN 1000 MCG/ML IJ SOLN
1000.0000 ug | Freq: Once | INTRAMUSCULAR | Status: AC
  Administered 2017-10-17: 1000 ug via INTRAMUSCULAR

## 2017-11-06 ENCOUNTER — Inpatient Hospital Stay: Payer: Medicare HMO | Attending: Oncology

## 2017-11-06 DIAGNOSIS — Z7982 Long term (current) use of aspirin: Secondary | ICD-10-CM | POA: Insufficient documentation

## 2017-11-06 DIAGNOSIS — Z7984 Long term (current) use of oral hypoglycemic drugs: Secondary | ICD-10-CM | POA: Insufficient documentation

## 2017-11-06 DIAGNOSIS — E559 Vitamin D deficiency, unspecified: Secondary | ICD-10-CM | POA: Insufficient documentation

## 2017-11-06 DIAGNOSIS — R5383 Other fatigue: Secondary | ICD-10-CM | POA: Insufficient documentation

## 2017-11-06 DIAGNOSIS — Z8249 Family history of ischemic heart disease and other diseases of the circulatory system: Secondary | ICD-10-CM | POA: Diagnosis not present

## 2017-11-06 DIAGNOSIS — N183 Chronic kidney disease, stage 3 (moderate): Secondary | ICD-10-CM | POA: Insufficient documentation

## 2017-11-06 DIAGNOSIS — E1122 Type 2 diabetes mellitus with diabetic chronic kidney disease: Secondary | ICD-10-CM | POA: Diagnosis not present

## 2017-11-06 DIAGNOSIS — I129 Hypertensive chronic kidney disease with stage 1 through stage 4 chronic kidney disease, or unspecified chronic kidney disease: Secondary | ICD-10-CM | POA: Insufficient documentation

## 2017-11-06 DIAGNOSIS — Z79899 Other long term (current) drug therapy: Secondary | ICD-10-CM | POA: Insufficient documentation

## 2017-11-06 DIAGNOSIS — D500 Iron deficiency anemia secondary to blood loss (chronic): Secondary | ICD-10-CM | POA: Insufficient documentation

## 2017-11-06 LAB — CBC WITH DIFFERENTIAL/PLATELET
BASOS ABS: 0.1 10*3/uL (ref 0–0.1)
Basophils Relative: 2 %
Eosinophils Absolute: 0.1 10*3/uL (ref 0–0.7)
Eosinophils Relative: 4 %
HEMATOCRIT: 33.8 % — AB (ref 35.0–47.0)
HEMOGLOBIN: 11.1 g/dL — AB (ref 12.0–16.0)
Lymphocytes Relative: 40 %
Lymphs Abs: 1.4 10*3/uL (ref 1.0–3.6)
MCH: 29.5 pg (ref 26.0–34.0)
MCHC: 33 g/dL (ref 32.0–36.0)
MCV: 89.4 fL (ref 80.0–100.0)
Monocytes Absolute: 0.2 10*3/uL (ref 0.2–0.9)
Monocytes Relative: 7 %
NEUTROS ABS: 1.7 10*3/uL (ref 1.4–6.5)
NEUTROS PCT: 47 %
Platelets: 216 10*3/uL (ref 150–440)
RBC: 3.78 MIL/uL — AB (ref 3.80–5.20)
RDW: 14.1 % (ref 11.5–14.5)
WBC: 3.6 10*3/uL (ref 3.6–11.0)

## 2017-11-06 LAB — IRON AND TIBC
Iron: 83 ug/dL (ref 28–170)
Saturation Ratios: 25 % (ref 10.4–31.8)
TIBC: 336 ug/dL (ref 250–450)
UIBC: 253 ug/dL

## 2017-11-06 LAB — FERRITIN: Ferritin: 59 ng/mL (ref 11–307)

## 2017-11-08 ENCOUNTER — Inpatient Hospital Stay (HOSPITAL_BASED_OUTPATIENT_CLINIC_OR_DEPARTMENT_OTHER): Payer: Medicare HMO | Admitting: Oncology

## 2017-11-08 ENCOUNTER — Encounter: Payer: Self-pay | Admitting: Oncology

## 2017-11-08 ENCOUNTER — Inpatient Hospital Stay: Payer: Medicare HMO

## 2017-11-08 ENCOUNTER — Other Ambulatory Visit: Payer: Self-pay

## 2017-11-08 VITALS — BP 125/68 | HR 84 | Temp 97.5°F | Resp 18 | Wt 124.0 lb

## 2017-11-08 DIAGNOSIS — D500 Iron deficiency anemia secondary to blood loss (chronic): Secondary | ICD-10-CM

## 2017-11-08 DIAGNOSIS — Z7982 Long term (current) use of aspirin: Secondary | ICD-10-CM

## 2017-11-08 DIAGNOSIS — E559 Vitamin D deficiency, unspecified: Secondary | ICD-10-CM | POA: Diagnosis not present

## 2017-11-08 DIAGNOSIS — Z8249 Family history of ischemic heart disease and other diseases of the circulatory system: Secondary | ICD-10-CM

## 2017-11-08 DIAGNOSIS — Z79899 Other long term (current) drug therapy: Secondary | ICD-10-CM

## 2017-11-08 DIAGNOSIS — I129 Hypertensive chronic kidney disease with stage 1 through stage 4 chronic kidney disease, or unspecified chronic kidney disease: Secondary | ICD-10-CM

## 2017-11-08 DIAGNOSIS — R5383 Other fatigue: Secondary | ICD-10-CM | POA: Diagnosis not present

## 2017-11-08 DIAGNOSIS — Z7984 Long term (current) use of oral hypoglycemic drugs: Secondary | ICD-10-CM

## 2017-11-08 DIAGNOSIS — E1122 Type 2 diabetes mellitus with diabetic chronic kidney disease: Secondary | ICD-10-CM

## 2017-11-08 DIAGNOSIS — N183 Chronic kidney disease, stage 3 (moderate): Secondary | ICD-10-CM

## 2017-11-08 NOTE — Progress Notes (Signed)
No venofer needed today per Dr. Tasia Catchings

## 2017-11-08 NOTE — Progress Notes (Signed)
Patient here for follow up. No changes since last visit.   

## 2017-11-08 NOTE — Progress Notes (Signed)
Hematology/Oncology follow up note [facility] Telephone:([phone]) [phone] Fax:(336) [phone]   Patient Care Team: Steele Sizer, MD as PCP - General (Family Medicine) Yolonda Kida, MD as Consulting Physician (Cardiology) Earlie Server, MD as Consulting Physician (Oncology)  REFERRING PROVIDER: Steele Sizer, MD REASON FOR VISIT Follow up for treatment of anemia  HISTORY OF PRESENTING ILLNESS:  Emma Campbell is a  84 y.o.  female with PMH listed below who was referred to me for evaluation of anemia.  Patient follows up with primary care physician and has had labs done recently. 04/17/17 CBC showed hemoglobin 9, MCV 81.9, platelet 245,000, WBC 4.1, normal differential.  Iron panel showed TIBC 442, ferritin 11, saturation 15%. Previous GI workup:  # 01/23/2017 EGD showed non bleeding angiotecsia, treated with APC. 12/22/2016 Capsule study showed duodenum AVM.  11/06/2016 colonoscopy showed non bleeding hemorroids, sigmoid polyp, and diverticulosis.   INTERVAL HISTORY Emma Campbell is a 84 y.o. female who has above history reviewed by me today presents for follow up for management of iron deficiency anemia.   Fatigue: reports fatigue has improved.  Skin rash/hives, has resolved. Patient found out that she maybe allergic to her washing powder.   Review of Systems  Constitutional: Negative for chills, fever, malaise/fatigue and weight loss.  HENT: Negative for congestion, ear discharge, ear pain, nosebleeds, sinus pain and sore throat.   Eyes: Negative for double vision, photophobia, pain, discharge and redness.  Respiratory: Negative for cough, hemoptysis, sputum production, shortness of breath and wheezing.   Cardiovascular: Negative for chest pain, palpitations, orthopnea, claudication and leg swelling.  Gastrointestinal: Negative for abdominal pain, blood in stool, constipation, diarrhea, heartburn, melena, nausea and vomiting.  Genitourinary: Negative for  dysuria, flank pain, frequency and hematuria.  Musculoskeletal: Negative for back pain, myalgias and neck pain.  Skin: Negative for itching and rash.  Neurological: Negative for dizziness, tingling, tremors, sensory change, focal weakness, weakness and headaches.  Endo/Heme/Allergies: Negative for environmental allergies. Does not bruise/bleed easily.  Psychiatric/Behavioral: Negative for depression, hallucinations and substance abuse. The patient is not nervous/anxious.     MEDICAL HISTORY:  Past Medical History:  Diagnosis Date  . Anemia   . Chronic kidney disease, stage III (moderate) (HCC)   . Diverticulosis   . Elevated LFTs   . Essential hypertension, malignant   . Fibrocystic breast disease   . Heart murmur   . Hyperlipidemia   . Menopausal problem   . Osteoarthrosis   . Osteoporosis   . Peripheral autonomic neuropathy   . Sick sinus syndrome (HCC)    Dr. Alfredo Batty  . Type II diabetes mellitus with renal manifestations ([place])   . Vitamin D deficiency     SURGICAL HISTORY: Past Surgical History:  Procedure Laterality Date  . COLONOSCOPY WITH PROPOFOL N/A 11/06/2016   Procedure: COLONOSCOPY WITH PROPOFOL;  Surgeon: Lucilla Lame, MD;  Location: [place];  Service: Endoscopy;  Laterality: N/A;  DIABETIC  . ESOPHAGOGASTRODUODENOSCOPY (EGD) WITH PROPOFOL N/A 11/06/2016   Procedure: ESOPHAGOGASTRODUODENOSCOPY (EGD) WITH PROPOFOL;  Surgeon: Lucilla Lame, MD;  Location: [place];  Service: Endoscopy;  Laterality: N/A;  . ESOPHAGOGASTRODUODENOSCOPY (EGD) WITH PROPOFOL N/A 01/23/2017   Procedure: ESOPHAGOGASTRODUODENOSCOPY (EGD) WITH PROPOFOL with PUSH;  Surgeon: Lucilla Lame, MD;  Location: [facility] ENDOSCOPY;  Service: Endoscopy;  Laterality: N/A;  . GIVENS CAPSULE STUDY N/A 12/22/2016   Procedure: GIVENS CAPSULE STUDY;  Surgeon: Lucilla Lame, MD;  Location: [facility] ENDOSCOPY;  Service: Endoscopy;  Laterality: N/A;  . PACEMAKER INSERTION  2006  .  POLYPECTOMY  11/06/2016   Procedure: POLYPECTOMY;  Surgeon: Lucilla Lame, MD;  Location: [place];  Service: Endoscopy;;    SOCIAL HISTORY: Social History   Socioeconomic History  . Marital status: Widowed    Spouse name: Shanon Brow  . Number of children: 1  . Years of education: 2 years of college  . Highest education level: 12th grade  Occupational History  . Occupation: Retired Agricultural engineer at [employer]  . Financial resource strain: Not hard at all  . Food insecurity:    Worry: Never true    Inability: Never true  . Transportation needs:    Medical: No    Non-medical: No  Tobacco Use  . Smoking status: Never Smoker  . Smokeless tobacco: Never Used  . Tobacco comment: smoking cessation materials not required  Substance and Sexual Activity  . Alcohol use: No    Alcohol/week: 0.0 standard drinks  . Drug use: No  . Sexual activity: Never  Lifestyle  . Physical activity:    Days per week: 7 days    Minutes per session: 30 min  . Stress: Not at all  Relationships  . Social connections:    Talks on phone: Patient refused    Gets together: Patient refused    Attends religious service: Patient refused    Active member of club or organization: Patient refused    Attends meetings of clubs or organizations: Patient refused    Relationship status: Widowed  . Intimate partner violence:    Fear of current or ex partner: No    Emotionally abused: No    Physically abused: No    Forced sexual activity: No  Other Topics Concern  . Not on file  Social History Narrative  . Not on file    FAMILY HISTORY: Family History  Problem Relation Age of Onset  . Hypertension Mother   . Stroke Mother   . Diabetes Daughter   . Stroke Father   . Healthy Sister   . Hypertension Maternal Grandmother     ALLERGIES:  is allergic to ace inhibitors.  MEDICATIONS:  Current Outpatient Medications  Medication Sig Dispense Refill  . amiodarone (PACERONE) 200 MG  tablet Take 100 mg by mouth daily.    Marland Kitchen amLODipine-valsartan (EXFORGE) 5-320 MG tablet TAKE 1 TABLET EVERY DAY 90 tablet 1  . aspirin 81 MG tablet Take 81 mg by mouth daily.    . cholecalciferol (VITAMIN D) 1000 UNITS tablet Take 200 Units by mouth daily.    . ferrous sulfate 325 (65 FE) MG EC tablet Take 1 tablet (325 mg total) by mouth 2 (two) times daily with a meal. 60 tablet 3  . folic acid (FOLVITE) 782 MCG tablet Take 400 mcg by mouth daily.    . hydrochlorothiazide (HYDRODIURIL) 25 MG tablet TAKE 1 TABLET EVERY DAY 90 tablet 1  . metFORMIN (GLUCOPHAGE-XR) 750 MG 24 hr tablet TAKE 1 TABLET EVERY EVENING 90 tablet 1  . rosuvastatin (CRESTOR) 5 MG tablet Take 1 tablet (5 mg total) by mouth daily. 90 tablet 3   Current Facility-Administered Medications  Medication Dose Route Frequency Provider Last Rate Last Dose  . cyanocobalamin ((VITAMIN B-12)) injection 1,000 mcg  1,000 mcg Intramuscular Q30 days Steele Sizer, MD   1,000 mcg at 09/18/17 1231     PHYSICAL EXAMINATION: ECOG PERFORMANCE STATUS: 1 - Symptomatic but completely ambulatory Vitals:   11/08/17 1401  BP: 125/68  Pulse: 84  Resp: 18  Temp: (!) 97.5 F (  36.4 C)   Filed Weights   11/08/17 1401  Weight: 124 lb (56.2 kg)    Physical Exam  Constitutional: She is oriented to person, place, and time and well-developed, well-nourished, and in no distress. No distress.  HENT:  Head: Normocephalic and atraumatic.  Nose: Nose normal.  Mouth/Throat: Oropharynx is clear and moist. No oropharyngeal exudate.  Eyes: Pupils are equal, round, and reactive to light. EOM are normal. Left eye exhibits no discharge. No scleral icterus.  Neck: Normal range of motion. Neck supple. No JVD present.  Cardiovascular: Normal rate, regular rhythm and normal heart sounds. Exam reveals no friction rub.  No murmur heard. Pulmonary/Chest: Effort normal and breath sounds normal. No respiratory distress. She has no wheezes. She has no rales.  She exhibits no tenderness.  Abdominal: Soft. Bowel sounds are normal. She exhibits no distension and no mass. There is no tenderness. There is no rebound and no guarding.  Musculoskeletal: Normal range of motion. She exhibits no edema or tenderness.  Lymphadenopathy:    She has no cervical adenopathy.  Neurological: She is alert and oriented to person, place, and time. No cranial nerve deficit. She exhibits normal muscle tone. Coordination normal.  Skin: Skin is warm and dry. No rash noted. She is not diaphoretic. No erythema.  Psychiatric: Affect and judgment normal.     LABORATORY DATA:  I have reviewed the data as listed Lab Results  Component Value Date   WBC 3.6 11/06/2017   HGB 11.1 (L) 11/06/2017   HCT 33.8 (L) 11/06/2017   MCV 89.4 11/06/2017   PLT 216 11/06/2017   Recent Labs    12/11/16 1139 07/30/17 1100  NA 139 137  K 4.2 3.7  CL 103 101  CO2 27 22  GLUCOSE 117* 107*  BUN 15 18  CREATININE 0.95* 1.01*  CALCIUM 9.1 9.0  GFRNONAA 56* 50*  GFRAA 65 58*  PROT 7.2  --   ALBUMIN  --  3.9  AST 15  --   ALT 14  --   BILITOT 0.3  --     Iron/TIBC/Ferritin/ %Sat    Component Value Date/Time   IRON 83 11/06/2017 1050   TIBC 336 11/06/2017 1050   FERRITIN 59 11/06/2017 1050   IRONPCTSAT 25 11/06/2017 1050   IRONPCTSAT 15 04/11/2016 1103   SPEP not observed M spike.   ASSESSMENT & PLAN:  1. Iron deficiency anemia due to chronic blood loss    # Iron deficiency anemia, Labs are reviewed and discussed with patient. Iron panel showed improvement.  Hemoglobin showed mild improvement.  Recommend continue take oral iron supplement ferrous sulfate 325mg  BID. She tolerates well and respond to treatment.   Orders Placed This Encounter  Procedures  . CBC with Differential/Platelet    Standing Status:   Future    Standing Expiration Date:   11/09/2018  . Iron and TIBC    Standing Status:   Future    Standing Expiration Date:   11/09/2018  . Ferritin    Standing  Status:   Future    Standing Expiration Date:   11/09/2018   All questions were answered. The patient knows to call the clinic with any problems questions or concerns.  Return of visit: 4 months with repeat labs.  Total face to face encounter time for this patient visit was 15 min. >50% of the time was  spent in counseling and coordination of care.    Earlie Server, MD, PhD Hematology Oncology [facility]  at [place]- [phone] 11/08/2017

## 2017-11-16 ENCOUNTER — Ambulatory Visit (INDEPENDENT_AMBULATORY_CARE_PROVIDER_SITE_OTHER): Payer: Medicare HMO

## 2017-11-16 DIAGNOSIS — E538 Deficiency of other specified B group vitamins: Secondary | ICD-10-CM

## 2017-11-16 MED ORDER — CYANOCOBALAMIN 1000 MCG/ML IJ SOLN
1000.0000 ug | Freq: Once | INTRAMUSCULAR | Status: AC
  Administered 2017-11-16: 1000 ug via INTRAMUSCULAR

## 2017-11-19 ENCOUNTER — Telehealth: Payer: Self-pay | Admitting: Family Medicine

## 2017-11-19 DIAGNOSIS — E785 Hyperlipidemia, unspecified: Secondary | ICD-10-CM

## 2017-11-19 MED ORDER — ROSUVASTATIN CALCIUM 5 MG PO TABS: 5.0000 mg | ORAL_TABLET | Freq: Every day | ORAL | 0 refills | Status: DC

## 2017-11-19 MED ORDER — FERROUS SULFATE 325 (65 FE) MG PO TBEC: 325.0000 mg | DELAYED_RELEASE_TABLET | Freq: Two times a day (BID) | ORAL | 0 refills | Status: DC

## 2017-11-19 NOTE — Telephone Encounter (Signed)
Copied from [place] [phone]. Topic: Quick Communication - Rx Refill/Question >> Nov 19, 2017  1:02 PM Mcneil, Ja-Kwan wrote: Medication: ferrous sulfate 325 (65 FE) MG EC tablet and rosuvastatin (CRESTOR) 5 MG tablet  Has the patient contacted their pharmacy? no  Preferred Pharmacy (with phone number or street name): [facility], [place] [phone] (Phone) [phone] (Fax)  Agent: Please be advised that RX refills may take up to 3 business days. We ask that you follow-up with your pharmacy.

## 2017-12-04 ENCOUNTER — Other Ambulatory Visit: Payer: Self-pay | Admitting: Family Medicine

## 2017-12-04 DIAGNOSIS — R809 Proteinuria, unspecified: Secondary | ICD-10-CM

## 2017-12-04 DIAGNOSIS — N182 Chronic kidney disease, stage 2 (mild): Secondary | ICD-10-CM

## 2017-12-04 DIAGNOSIS — E1122 Type 2 diabetes mellitus with diabetic chronic kidney disease: Secondary | ICD-10-CM

## 2017-12-04 DIAGNOSIS — E1129 Type 2 diabetes mellitus with other diabetic kidney complication: Secondary | ICD-10-CM

## 2017-12-04 DIAGNOSIS — I10 Essential (primary) hypertension: Secondary | ICD-10-CM

## 2017-12-04 MED ORDER — ROSUVASTATIN CALCIUM 5 MG PO TABS: 5.0000 mg | ORAL_TABLET | Freq: Every day | ORAL | 0 refills | Status: DC

## 2017-12-04 MED ORDER — FERROUS SULFATE 325 (65 FE) MG PO TBEC: 325.0000 mg | DELAYED_RELEASE_TABLET | Freq: Two times a day (BID) | ORAL | 1 refills | Status: DC

## 2017-12-04 NOTE — Addendum Note (Signed)
Addended by: Steele Sizer F on: 12/04/2017 03:55 PM   Modules accepted: Orders

## 2017-12-04 NOTE — Addendum Note (Signed)
Addended by: Steele Sizer F on: 12/04/2017 03:57 PM   Modules accepted: Orders

## 2017-12-04 NOTE — Telephone Encounter (Signed)
Patient called and said that these medications were not suppose to go to the local pharmacy. She said that all her medications go to [facility]. The patient is out of her medications now and would like those to be re-sent. Thanks

## 2017-12-17 ENCOUNTER — Encounter: Payer: Self-pay | Admitting: Family Medicine

## 2017-12-17 ENCOUNTER — Ambulatory Visit (INDEPENDENT_AMBULATORY_CARE_PROVIDER_SITE_OTHER): Payer: Medicare HMO | Admitting: Family Medicine

## 2017-12-17 VITALS — BP 130/76 | HR 76 | Temp 97.8°F | Resp 16 | Ht 61.0 in | Wt 124.1 lb

## 2017-12-17 DIAGNOSIS — D509 Iron deficiency anemia, unspecified: Secondary | ICD-10-CM

## 2017-12-17 DIAGNOSIS — I480 Paroxysmal atrial fibrillation: Secondary | ICD-10-CM | POA: Diagnosis not present

## 2017-12-17 DIAGNOSIS — E1122 Type 2 diabetes mellitus with diabetic chronic kidney disease: Secondary | ICD-10-CM

## 2017-12-17 DIAGNOSIS — N183 Chronic kidney disease, stage 3 unspecified: Secondary | ICD-10-CM

## 2017-12-17 DIAGNOSIS — I495 Sick sinus syndrome: Secondary | ICD-10-CM

## 2017-12-17 DIAGNOSIS — Z950 Presence of cardiac pacemaker: Secondary | ICD-10-CM

## 2017-12-17 DIAGNOSIS — Z23 Encounter for immunization: Secondary | ICD-10-CM

## 2017-12-17 DIAGNOSIS — E538 Deficiency of other specified B group vitamins: Secondary | ICD-10-CM

## 2017-12-17 DIAGNOSIS — R7989 Other specified abnormal findings of blood chemistry: Secondary | ICD-10-CM

## 2017-12-17 DIAGNOSIS — I10 Essential (primary) hypertension: Secondary | ICD-10-CM

## 2017-12-17 DIAGNOSIS — E785 Hyperlipidemia, unspecified: Secondary | ICD-10-CM

## 2017-12-17 LAB — POCT GLYCOSYLATED HEMOGLOBIN (HGB A1C): HBA1C, POC (CONTROLLED DIABETIC RANGE): 6.1 % (ref 0.0–7.0)

## 2017-12-17 NOTE — Progress Notes (Signed)
Name: Emma Campbell   MRN: [id number]    DOB: 20-Oct-1932   Date:12/17/2017       Progress Note  Subjective  Chief Complaint  Chief Complaint  Patient presents with  . Medication Refill  . Diabetes    Checks weekly Average-120  . Hypertension    Denies any symptoms  . Hyperlipidemia  . Osteopenia    HPI  DMII with renal manifestation: CKI and microalbuminuria. HgbA1C has been stable and today it was 6.1% She denies hypoglycemia episodes. She feels well. Glucose at home is getting checked a couple of times weekly , values between 115-129She is complaint with her diet, she has been walking for 30 minutes 6 days a week. She denies polyphagia, polydipsia or polyuria ( except right after she takes HCTZ) . Eye exam scheduled for Oct 30 th.  Continue ARB for kidney protection. We will recheck urine micro and GFR today   HTN: taking medicationdaily and bp is at goal, no dizziness, chest pain or palpitation. BP is at goal.   Hyperlipidemia: taking Crestor5 mg and denies myalgias. we will recheck labs today   Sick Sinus Syndrome: she had a pace makerplaced in 2006 and is taking Amiodarone since 2006, she is now down to 100 mg of Amiodarone daily, last TSH was back to normal we will recheck it again today,  no chest pain or palpitation.Sees Dr. Clayborn Bigness, last visit 07/2017, she is [name] score of 2, and is on aspirin only per his recommendation. She is at a high risk of falls because of age and does not want anti-coagulant. She also has a history of Afib.   Vitamin D deficiency: still taking supplementation, denies fatigue, she has CKI and we will recheck level   Osteopenia: used to take Fosamax but has been off and last bone density reviewed with patient, low FRAX score, continue high calcium diet and vitamin D supplementation, she is also walking daily and is doing well.   Anemia: iron deficiency and positive hemoccult, seen by Dr. Durwin Reges andhadEGD and colonoscopy September  2018,diagnosed with AVM small bowel and had it cauterized, hemoglobin dropped and was referred to Dr. Tasia Catchings, labs are being monitored by her, last HCT improved, iron storage back to normal.   B12 deficiency: getting supplementation , B12 injections today  Patient Active Problem List   Diagnosis Date Noted  . B12 deficiency 05/21/2017  . Gastrointestinal tract imaging abnormality   . Iron deficiency anemia   . Polyp of sigmoid colon   . Type 2 diabetes mellitus with stage 2 chronic kidney disease, without long-term current use of insulin ([place]) 08/03/2015  . Arthritis, degenerative 05/03/2015  . Menopausal and perimenopausal disorder 05/03/2015  . Chronic kidney disease (CKD), stage III (moderate) ([place]) 11/30/2014  . High risk medication use 11/30/2014  . Diverticulosis 11/30/2014  . Fibrocystic breast disease 11/30/2014  . Elevated LFTs 11/30/2014  . Systolic ejection murmur 40/98/1191  . Paroxysmal A-fib ([place]) 11/30/2014  . Microalbuminuria 07/31/2014  . Elevated TSH 07/31/2014  . History of cardiac pacemaker 07/31/2014  . Sick sinus syndrome ([place]) 08/13/2013  . Hypercholesteremia 08/13/2013  . Hypertension, benign 08/13/2013  . Diabetes mellitus with renal manifestations, controlled ([place]) 08/13/2013  . Avitaminosis D 04/12/2009    Past Surgical History:  Procedure Laterality Date  . COLONOSCOPY WITH PROPOFOL N/A 11/06/2016   Procedure: COLONOSCOPY WITH PROPOFOL;  Surgeon: Lucilla Lame, MD;  Location: [place];  Service: Endoscopy;  Laterality: N/A;  DIABETIC  . ESOPHAGOGASTRODUODENOSCOPY (EGD) WITH PROPOFOL  N/A 11/06/2016   Procedure: ESOPHAGOGASTRODUODENOSCOPY (EGD) WITH PROPOFOL;  Surgeon: Lucilla Lame, MD;  Location: [place];  Service: Endoscopy;  Laterality: N/A;  . ESOPHAGOGASTRODUODENOSCOPY (EGD) WITH PROPOFOL N/A 01/23/2017   Procedure: ESOPHAGOGASTRODUODENOSCOPY (EGD) WITH PROPOFOL with PUSH;  Surgeon: Lucilla Lame, MD;  Location: [facility] ENDOSCOPY;   Service: Endoscopy;  Laterality: N/A;  . GIVENS CAPSULE STUDY N/A 12/22/2016   Procedure: GIVENS CAPSULE STUDY;  Surgeon: Lucilla Lame, MD;  [facility] ENDOSCOPY;  Service: Endoscopy;  Laterality: N/A;  . PACEMAKER INSERTION  2006  . POLYPECTOMY  11/06/2016   Procedure: POLYPECTOMY;  Surgeon: Lucilla Lame, MD;  Location: [place];  Service: Endoscopy;;    Family History  Problem Relation Age of Onset  . Hypertension Mother   . Stroke Mother   . Diabetes Daughter   . Stroke Father   . Healthy Sister   . Hypertension Maternal Grandmother     Social History   Socioeconomic History  . Marital status: Widowed    Spouse name: Shanon Brow  . Number of children: 1  . Years of education: 2 years of college  . Highest education level: 12th grade  Occupational History  . Occupation: Retired Agricultural engineer at [employer]  . Financial resource strain: Not hard at all  . Food insecurity:    Worry: Never true    Inability: Never true  . Transportation needs:    Medical: No    Non-medical: No  Tobacco Use  . Smoking status: Never Smoker  . Smokeless tobacco: Never Used  . Tobacco comment: smoking cessation materials not required  Substance and Sexual Activity  . Alcohol use: No    Alcohol/week: 0.0 standard drinks  . Drug use: No  . Sexual activity: Never  Lifestyle  . Physical activity:    Days per week: 7 days    Minutes per session: 30 min  . Stress: Not at all  Relationships  . Social connections:    Talks on phone: Patient refused    Gets together: Patient refused    Attends religious service: Patient refused    Active member of club or organization: Patient refused    Attends meetings of clubs or organizations: Patient refused    Relationship status: Widowed  . Intimate partner violence:    Fear of current or ex partner: No    Emotionally abused: No    Physically abused: No    Forced sexual activity: No  Other Topics Concern  . Not on file   Social History Narrative  . Not on file     Current Outpatient Medications:  .  amiodarone (PACERONE) 200 MG tablet, Take 100 mg by mouth daily., Disp: , Rfl:  .  amLODipine-valsartan (EXFORGE) 5-320 MG tablet, TAKE 1 TABLET EVERY DAY, Disp: 90 tablet, Rfl: 1 .  aspirin 81 MG tablet, Take 81 mg by mouth daily., Disp: , Rfl:  .  cholecalciferol (VITAMIN D) 1000 UNITS tablet, Take 200 Units by mouth daily., Disp: , Rfl:  .  ferrous sulfate 325 (65 FE) MG EC tablet, Take 1 tablet (325 mg total) by mouth 2 (two) times daily with a meal., Disp: 180 tablet, Rfl: 1 .  folic acid (FOLVITE) 174 MCG tablet, Take 400 mcg by mouth daily., Disp: , Rfl:  .  hydrochlorothiazide (HYDRODIURIL) 25 MG tablet, TAKE 1 TABLET EVERY DAY, Disp: 90 tablet, Rfl: 1 .  metFORMIN (GLUCOPHAGE-XR) 750 MG 24 hr tablet, TAKE 1 TABLET EVERY EVENING, Disp: 90 tablet, Rfl:  1 .  rosuvastatin (CRESTOR) 5 MG tablet, Take 1 tablet (5 mg total) by mouth daily., Disp: 90 tablet, Rfl: 0  Current Facility-Administered Medications:  .  cyanocobalamin ((VITAMIN B-12)) injection 1,000 mcg, 1,000 mcg, Intramuscular, Q30 days, Steele Sizer, MD, 1,000 mcg at 12/17/17 1050  Allergies  Allergen Reactions  . Ace Inhibitors Other (See Comments)    Pt unable to recall. Was advised to avoid    I personally reviewed active problem list, medication list, allergies, family history, social history with the patient/caregiver today.   ROS  Constitutional: Negative for fever or weight change.  Respiratory: Negative for cough and shortness of breath.   Cardiovascular: Negative for chest pain or palpitations.  Gastrointestinal: Negative for abdominal pain, no bowel changes.  Musculoskeletal: Negative for gait problem or joint swelling.  Skin: Negative for rash.  Neurological: Negative for dizziness or headache.  No other specific complaints in a complete review of systems (except as listed in HPI above).  Objective  Vitals:    12/17/17 1052  BP: 130/76  Pulse: 76  Resp: 16  Temp: 97.8 F (36.6 C)  TempSrc: Oral  SpO2: 98%  Weight: 124 lb 1.6 oz (56.3 kg)  Height: 5\' 1"  (1.549 m)    Body mass index is 23.45 kg/m.  Physical Exam  Constitutional: Patient appears well-developed and well-nourished. No distress.  HEENT: head atraumatic, normocephalic, neck supple, throat within normal limits Cardiovascular: Normal rate, regular rhythm and normal heart sounds.  3/6 SEM . No BLE edema. Pulmonary/Chest: Effort normal and breath sounds normal. No respiratory distress. Abdominal: Soft.  There is no tenderness. Psychiatric: Patient has a normal mood and affect. behavior is normal. Judgment and thought content normal.  Recent Results (from the past 2160 hour(s))  Iron and TIBC     Status: None   Collection Time: 11/06/17 10:50 AM  Result Value Ref Range   Iron 83 28 - 170 ug/dL   TIBC 336 250 - 450 ug/dL   Saturation Ratios 25 10.4 - 31.8 %   UIBC 253 ug/dL    Comment: Performed at [facility], [address] [place], [place] [address]  CBC with Differential/Platelet     Status: Abnormal   Collection Time: 11/06/17 10:50 AM  Result Value Ref Range   WBC 3.6 3.6 - 11.0 K/uL   RBC 3.78 (L) 3.80 - 5.20 MIL/uL   Hemoglobin 11.1 (L) 12.0 - 16.0 g/dL   HCT 33.8 (L) 35.0 - 47.0 %   MCV 89.4 80.0 - 100.0 fL   MCH 29.5 26.0 - 34.0 pg   MCHC 33.0 32.0 - 36.0 g/dL   RDW 14.1 11.5 - 14.5 %   Platelets 216 150 - 440 K/uL   Neutrophils Relative % 47 %   Neutro Abs 1.7 1.4 - 6.5 K/uL   Lymphocytes Relative 40 %   Lymphs Abs 1.4 1.0 - 3.6 K/uL   Monocytes Relative 7 %   Monocytes Absolute 0.2 0.2 - 0.9 K/uL   Eosinophils Relative 4 %   Eosinophils Absolute 0.1 0 - 0.7 K/uL   Basophils Relative 2 %   Basophils Absolute 0.1 0 - 0.1 K/uL    Comment: Performed at [facility], [address] [place], [place] [address]  Ferritin     Status: None   Collection Time: 11/06/17 10:50 AM  Result Value  Ref Range   Ferritin 59 11 - 307 ng/mL    Comment: Performed at [facility], [address] [place],  [place] [address]    Diabetic Foot Exam: Diabetic Foot Exam - Simple   Simple Foot Form Diabetic Foot exam was performed with the following findings:  Yes 12/17/2017 10:55 AM  Visual Inspection See comments:  Yes Sensation Testing Intact to touch and monofilament testing bilaterally:  Yes Pulse Check Posterior Tibialis and Dorsalis pulse intact bilaterally:  Yes Comments Thick toenails     PHQ2/9: Depression screen [facility] 2/9 12/17/2017 08/15/2017 05/18/2017 05/18/2017 04/17/2017  Decreased Interest 0 0 0 0 0  Down, Depressed, Hopeless 0 0 0 0 0  PHQ - 2 Score 0 0 0 0 0  Altered sleeping - - 0 - -  Tired, decreased energy - - 0 - -  Change in appetite - - 0 - -  Feeling bad or failure about yourself  - - 0 - -  Trouble concentrating - - 0 - -  Moving slowly or fidgety/restless - - 0 - -  Suicidal thoughts - - 0 - -  PHQ-9 Score - - 0 - -  Difficult doing work/chores - - Not difficult at all - -     Fall Risk: Fall Risk  12/17/2017 08/15/2017 05/18/2017 05/18/2017 04/17/2017  Falls in the past year? No No Yes Yes Yes  Number falls in past yr: - - 1 1 1   Injury with Fall? - - Yes Yes Yes  Comment - - - - Hit her face and broke some teeth  Risk for fall due to : - - History of fall(s) History of fall(s);Impaired vision -  Risk for fall due to: Comment - - - walking down ramp and tripped over dog. Denied LOC; wears eyeglasses -  Follow up - - Falls prevention discussed Falls prevention discussed -     Functional Status Survey: Is the patient deaf or have difficulty hearing?: No Does the patient have difficulty seeing, even when wearing glasses/contacts?: Yes Does the patient have difficulty concentrating, remembering, or making decisions?: No Does the patient have difficulty walking or climbing stairs?: No Does the patient have difficulty dressing or bathing?:  No Does the patient have difficulty doing errands alone such as visiting a doctor's office or shopping?: No    Assessment & Plan  1. Type 2 diabetes mellitus with stage 3 chronic kidney disease, without long-term current use of insulin (HCC)  - POCT HgB A1C  2. Need for immunization against influenza  - Flu vaccine HIGH DOSE PF (Fluzone High dose)  3. Paroxysmal A-fib (HCC)  Not in afib now on aspirin only   4. Chronic kidney disease, stage III (moderate) (HCC)  - VITAMIN D 25 Hydroxy (Vit-D Deficiency, Fractures)  5. Dyslipidemia  - Lipid panel  6. B12 deficiency  -B12 today   7. Hypertension, benign  - COMPLETE METABOLIC PANEL WITH GFR  8. Sick sinus syndrome ([place])  Has pacemaker   9. Iron deficiency anemia, unspecified iron deficiency anemia type  Under the care of hematologist and hgb has been improving, unknown etiology   10. Pacemaker  Under the care of Dr. Clayborn Bigness

## 2017-12-18 LAB — COMPLETE METABOLIC PANEL WITH GFR
AG Ratio: 1.6 (calc) (ref 1.0–2.5)
ALT: 11 U/L (ref 6–29)
AST: 16 U/L (ref 10–35)
Albumin: 4.4 g/dL (ref 3.6–5.1)
Alkaline phosphatase (APISO): 53 U/L (ref 33–130)
BUN/Creatinine Ratio: 16 (calc) (ref 6–22)
BUN: 19 mg/dL (ref 7–25)
CALCIUM: 9.6 mg/dL (ref 8.6–10.4)
CO2: 27 mmol/L (ref 20–32)
CREATININE: 1.19 mg/dL — AB (ref 0.60–0.88)
Chloride: 100 mmol/L (ref 98–110)
GFR, EST NON AFRICAN AMERICAN: 42 mL/min/{1.73_m2} — AB (ref >=60)
GFR, Est African American: 49 mL/min/{1.73_m2} — ABNORMAL LOW (ref >=60)
GLUCOSE: 99 mg/dL (ref 65–99)
Globulin: 2.8 g/dL (calc) (ref 1.9–3.7)
Potassium: 4.2 mmol/L (ref 3.5–5.3)
Sodium: 138 mmol/L (ref 135–146)
Total Bilirubin: 0.4 mg/dL (ref 0.2–1.2)
Total Protein: 7.2 g/dL (ref 6.1–8.1)

## 2017-12-18 LAB — LIPID PANEL
CHOL/HDL RATIO: 3.6 (calc) (ref <5.0)
Cholesterol: 270 mg/dL — ABNORMAL HIGH (ref <200)
HDL: 74 mg/dL (ref >50)
LDL Cholesterol (Calc): 175 mg/dL (calc) — ABNORMAL HIGH
NON-HDL CHOLESTEROL (CALC): 196 mg/dL — AB (ref <130)
TRIGLYCERIDES: 92 mg/dL (ref <150)

## 2017-12-18 LAB — MICROALBUMIN / CREATININE URINE RATIO
CREATININE, URINE: 72 mg/dL (ref 20–275)
MICROALB/CREAT RATIO: 14 ug/mg{creat} (ref <30)
Microalb, Ur: 1 mg/dL

## 2017-12-18 LAB — TSH: TSH: 3.24 mIU/L (ref 0.40–4.50)

## 2017-12-18 LAB — VITAMIN D 25 HYDROXY (VIT D DEFICIENCY, FRACTURES): VIT D 25 HYDROXY: 36 ng/mL (ref 30–100)

## 2017-12-26 LAB — HM DIABETES EYE EXAM

## 2017-12-31 ENCOUNTER — Encounter: Payer: Self-pay | Admitting: Family Medicine

## 2018-02-05 ENCOUNTER — Ambulatory Visit (INDEPENDENT_AMBULATORY_CARE_PROVIDER_SITE_OTHER): Payer: Medicare HMO

## 2018-02-05 DIAGNOSIS — E538 Deficiency of other specified B group vitamins: Secondary | ICD-10-CM

## 2018-02-05 NOTE — Progress Notes (Signed)
Patient came in for her monthly B12 injection.

## 2018-02-11 ENCOUNTER — Other Ambulatory Visit: Payer: Self-pay | Admitting: Family Medicine

## 2018-02-11 DIAGNOSIS — E785 Hyperlipidemia, unspecified: Secondary | ICD-10-CM

## 2018-02-11 NOTE — Telephone Encounter (Signed)
Refill Request for Cholesterol medication. Crestor 5 mg  Last physical: 05/18/2017  Lab Results  Component Value Date   CHOL 270 (H) 12/17/2017   HDL 74 12/17/2017   LDLCALC 175 (H) 12/17/2017   TRIG 92 12/17/2017   CHOLHDL 3.6 12/17/2017    Follow up: 04/19/2018

## 2018-03-08 ENCOUNTER — Inpatient Hospital Stay: Payer: Medicare HMO | Attending: Oncology

## 2018-03-08 ENCOUNTER — Ambulatory Visit (INDEPENDENT_AMBULATORY_CARE_PROVIDER_SITE_OTHER): Payer: Medicare HMO

## 2018-03-08 DIAGNOSIS — I129 Hypertensive chronic kidney disease with stage 1 through stage 4 chronic kidney disease, or unspecified chronic kidney disease: Secondary | ICD-10-CM | POA: Diagnosis not present

## 2018-03-08 DIAGNOSIS — E785 Hyperlipidemia, unspecified: Secondary | ICD-10-CM | POA: Diagnosis not present

## 2018-03-08 DIAGNOSIS — E559 Vitamin D deficiency, unspecified: Secondary | ICD-10-CM | POA: Diagnosis not present

## 2018-03-08 DIAGNOSIS — Z79899 Other long term (current) drug therapy: Secondary | ICD-10-CM | POA: Insufficient documentation

## 2018-03-08 DIAGNOSIS — E1122 Type 2 diabetes mellitus with diabetic chronic kidney disease: Secondary | ICD-10-CM | POA: Diagnosis not present

## 2018-03-08 DIAGNOSIS — Z8249 Family history of ischemic heart disease and other diseases of the circulatory system: Secondary | ICD-10-CM | POA: Insufficient documentation

## 2018-03-08 DIAGNOSIS — E538 Deficiency of other specified B group vitamins: Secondary | ICD-10-CM

## 2018-03-08 DIAGNOSIS — R5383 Other fatigue: Secondary | ICD-10-CM | POA: Insufficient documentation

## 2018-03-08 DIAGNOSIS — Z7982 Long term (current) use of aspirin: Secondary | ICD-10-CM | POA: Diagnosis not present

## 2018-03-08 DIAGNOSIS — N183 Chronic kidney disease, stage 3 (moderate): Secondary | ICD-10-CM | POA: Diagnosis not present

## 2018-03-08 DIAGNOSIS — Z7984 Long term (current) use of oral hypoglycemic drugs: Secondary | ICD-10-CM | POA: Diagnosis not present

## 2018-03-08 DIAGNOSIS — D500 Iron deficiency anemia secondary to blood loss (chronic): Secondary | ICD-10-CM | POA: Diagnosis not present

## 2018-03-08 LAB — CBC WITH DIFFERENTIAL/PLATELET
ABS IMMATURE GRANULOCYTES: 0.01 10*3/uL (ref 0.00–0.07)
BASOS ABS: 0 10*3/uL (ref 0.0–0.1)
Basophils Relative: 1 %
EOS ABS: 0.2 10*3/uL (ref 0.0–0.5)
Eosinophils Relative: 4 %
HEMATOCRIT: 32.7 % — AB (ref 36.0–46.0)
Hemoglobin: 10.3 g/dL — ABNORMAL LOW (ref 12.0–15.0)
IMMATURE GRANULOCYTES: 0 %
LYMPHS ABS: 1.7 10*3/uL (ref 0.7–4.0)
LYMPHS PCT: 39 %
MCH: 28.9 pg (ref 26.0–34.0)
MCHC: 31.5 g/dL (ref 30.0–36.0)
MCV: 91.6 fL (ref 80.0–100.0)
MONOS PCT: 7 %
Monocytes Absolute: 0.3 10*3/uL (ref 0.1–1.0)
NEUTROS ABS: 2.1 10*3/uL (ref 1.7–7.7)
NRBC: 0 % (ref 0.0–0.2)
Neutrophils Relative %: 49 %
PLATELETS: 192 10*3/uL (ref 150–400)
RBC: 3.57 MIL/uL — ABNORMAL LOW (ref 3.87–5.11)
RDW: 13.1 % (ref 11.5–15.5)
WBC: 4.3 10*3/uL (ref 4.0–10.5)

## 2018-03-08 LAB — IRON AND TIBC
Iron: 76 ug/dL (ref 28–170)
Saturation Ratios: 24 % (ref 10.4–31.8)
TIBC: 321 ug/dL (ref 250–450)
UIBC: 245 ug/dL

## 2018-03-08 LAB — FERRITIN: FERRITIN: 54 ng/mL (ref 11–307)

## 2018-03-08 NOTE — Progress Notes (Signed)
Patient came in for her B-12 injection. She tolerated it well. NKDA.   

## 2018-03-11 ENCOUNTER — Other Ambulatory Visit: Payer: Self-pay

## 2018-03-11 ENCOUNTER — Encounter: Payer: Self-pay | Admitting: Oncology

## 2018-03-11 ENCOUNTER — Inpatient Hospital Stay (HOSPITAL_BASED_OUTPATIENT_CLINIC_OR_DEPARTMENT_OTHER): Payer: Medicare HMO | Admitting: Oncology

## 2018-03-11 VITALS — BP 139/77 | HR 80 | Temp 96.7°F | Resp 18 | Wt 126.8 lb

## 2018-03-11 DIAGNOSIS — Z7984 Long term (current) use of oral hypoglycemic drugs: Secondary | ICD-10-CM

## 2018-03-11 DIAGNOSIS — Z79899 Other long term (current) drug therapy: Secondary | ICD-10-CM

## 2018-03-11 DIAGNOSIS — E1122 Type 2 diabetes mellitus with diabetic chronic kidney disease: Secondary | ICD-10-CM

## 2018-03-11 DIAGNOSIS — Z8249 Family history of ischemic heart disease and other diseases of the circulatory system: Secondary | ICD-10-CM

## 2018-03-11 DIAGNOSIS — I129 Hypertensive chronic kidney disease with stage 1 through stage 4 chronic kidney disease, or unspecified chronic kidney disease: Secondary | ICD-10-CM

## 2018-03-11 DIAGNOSIS — Z7982 Long term (current) use of aspirin: Secondary | ICD-10-CM

## 2018-03-11 DIAGNOSIS — E559 Vitamin D deficiency, unspecified: Secondary | ICD-10-CM | POA: Diagnosis not present

## 2018-03-11 DIAGNOSIS — R5383 Other fatigue: Secondary | ICD-10-CM

## 2018-03-11 DIAGNOSIS — D500 Iron deficiency anemia secondary to blood loss (chronic): Secondary | ICD-10-CM

## 2018-03-11 DIAGNOSIS — E785 Hyperlipidemia, unspecified: Secondary | ICD-10-CM | POA: Diagnosis not present

## 2018-03-11 DIAGNOSIS — N183 Chronic kidney disease, stage 3 (moderate): Secondary | ICD-10-CM

## 2018-03-11 NOTE — Progress Notes (Signed)
Hematology/Oncology follow up note [facility] [phone] [phone]   Patient Care Team: Steele Sizer, MD as PCP - General (Family Medicine) Yolonda Kida, MD as Consulting Physician (Cardiology) Earlie Server, MD as Consulting Physician (Oncology)  REFERRING PROVIDER: Steele Sizer, MD REASON FOR VISIT Follow up for treatment of anemia  HISTORY OF PRESENTING ILLNESS:  Emma Campbell is a  85 y.o.  female with PMH listed below who was referred to me for evaluation of anemia.  Patient follows up with primary care physician and has had labs done recently. 04/17/17 CBC showed hemoglobin 9, MCV 81.9, platelet 245,000, WBC 4.1, normal differential.  Iron panel showed TIBC 442, ferritin 11, saturation 15%. Previous GI workup:  # 01/23/2017 EGD showed non bleeding angiotecsia, treated with APC. 12/22/2016 Capsule study showed duodenum AVM.  11/06/2016 colonoscopy showed non bleeding hemorroids, sigmoid polyp, and diverticulosis.   INTERVAL HISTORY Emma Campbell is a 85 y.o. female who has above history reviewed by me today presents for follow up for management of iron deficiency anemia.   #Patient reports doing pretty well.  Fatigue has improved. Denies hematochezia, hematuria, hematemesis, epistaxis, black tarry stool or easy bruising.  Appetite is fair. Review of Systems  Constitutional: Negative for chills, fever, malaise/fatigue and weight loss.  HENT: Negative for sore throat.   Eyes: Negative for redness.  Respiratory: Negative for cough, shortness of breath and wheezing.   Cardiovascular: Negative for chest pain, palpitations and leg swelling.  Gastrointestinal: Negative for abdominal pain, blood in stool, nausea and vomiting.  Genitourinary: Negative for dysuria.  Musculoskeletal: Negative for myalgias.  Skin: Negative for rash.  Neurological: Negative for dizziness, tingling and tremors.  Endo/Heme/Allergies: Does not bruise/bleed  easily.  Psychiatric/Behavioral: Negative for hallucinations.    MEDICAL HISTORY:  Past Medical History:  Diagnosis Date  . Anemia   . Chronic kidney disease, stage III (moderate) (HCC)   . Diverticulosis   . Elevated LFTs   . Essential hypertension, malignant   . Fibrocystic breast disease   . Heart murmur   . Hyperlipidemia   . Menopausal problem   . Osteoarthrosis   . Osteoporosis   . Peripheral autonomic neuropathy   . Sick sinus syndrome (HCC)    Dr. Alfredo Batty  . Type II diabetes mellitus with renal manifestations ([place])   . Vitamin D deficiency     SURGICAL HISTORY: Past Surgical History:  Procedure Laterality Date  . COLONOSCOPY WITH PROPOFOL N/A 11/06/2016   Procedure: COLONOSCOPY WITH PROPOFOL;  Surgeon: Lucilla Lame, MD;  Location: [place];  Service: Endoscopy;  Laterality: N/A;  DIABETIC  . ESOPHAGOGASTRODUODENOSCOPY (EGD) WITH PROPOFOL N/A 11/06/2016   Procedure: ESOPHAGOGASTRODUODENOSCOPY (EGD) WITH PROPOFOL;  Surgeon: Lucilla Lame, MD;  Location: [place];  Service: Endoscopy;  Laterality: N/A;  . ESOPHAGOGASTRODUODENOSCOPY (EGD) WITH PROPOFOL N/A 01/23/2017   Procedure: ESOPHAGOGASTRODUODENOSCOPY (EGD) WITH PROPOFOL with PUSH;  Surgeon: Lucilla Lame, MD;  Location: [facility] ENDOSCOPY;  Service: Endoscopy;  Laterality: N/A;  . GIVENS CAPSULE STUDY N/A 12/22/2016   Procedure: GIVENS CAPSULE STUDY;  Surgeon: Lucilla Lame, MD;  [facility] ENDOSCOPY;  Service: Endoscopy;  Laterality: N/A;  . PACEMAKER INSERTION  2006  . POLYPECTOMY  11/06/2016   Procedure: POLYPECTOMY;  Surgeon: Lucilla Lame, MD;  Location: [place];  Service: Endoscopy;;    SOCIAL HISTORY: Social History   Socioeconomic History  . Marital status: Widowed    Spouse name: Shanon Brow  . Number of children: 1  . Years of education:  2 years of college  . Highest education level: 12th grade  Occupational History  . Occupation: Retired Agricultural engineer at [employer]  . Financial resource strain: Not hard at all  . Food insecurity:    Worry: Never true    Inability: Never true  . Transportation needs:    Medical: No    Non-medical: No  Tobacco Use  . Smoking status: Never Smoker  . Smokeless tobacco: Never Used  . Tobacco comment: smoking cessation materials not required  Substance and Sexual Activity  . Alcohol use: No    Alcohol/week: 0.0 standard drinks  . Drug use: No  . Sexual activity: Never  Lifestyle  . Physical activity:    Days per week: 7 days    Minutes per session: 30 min  . Stress: Not at all  Relationships  . Social connections:    Talks on phone: Patient refused    Gets together: Patient refused    Attends religious service: Patient refused    Active member of club or organization: Patient refused    Attends meetings of clubs or organizations: Patient refused    Relationship status: Widowed  . Intimate partner violence:    Fear of current or ex partner: No    Emotionally abused: No    Physically abused: No    Forced sexual activity: No  Other Topics Concern  . Not on file  Social History Narrative  . Not on file    FAMILY HISTORY: Family History  Problem Relation Age of Onset  . Hypertension Mother   . Stroke Mother   . Diabetes Daughter   . Stroke Father   . Healthy Sister   . Hypertension Maternal Grandmother     ALLERGIES:  is allergic to ace inhibitors.  MEDICATIONS:  Current Outpatient Medications  Medication Sig Dispense Refill  . amiodarone (PACERONE) 200 MG tablet Take 100 mg by mouth daily.    Marland Kitchen amLODipine-valsartan (EXFORGE) 5-320 MG tablet TAKE 1 TABLET EVERY DAY 90 tablet 1  . aspirin 81 MG tablet Take 81 mg by mouth daily.    . cholecalciferol (VITAMIN D) 1000 UNITS tablet Take 200 Units by mouth daily.    . ferrous sulfate 325 (65 FE) MG EC tablet Take 1 tablet (325 mg total) by mouth 2 (two) times daily with a meal. 106 tablet 1  . folic acid (FOLVITE) 269 MCG  tablet Take 400 mcg by mouth daily.    . hydrochlorothiazide (HYDRODIURIL) 25 MG tablet TAKE 1 TABLET EVERY DAY 90 tablet 1  . metFORMIN (GLUCOPHAGE-XR) 750 MG 24 hr tablet TAKE 1 TABLET EVERY EVENING 90 tablet 1  . rosuvastatin (CRESTOR) 5 MG tablet TAKE 1 TABLET EVERY DAY 90 tablet 0   Current Facility-Administered Medications  Medication Dose Route Frequency Provider Last Rate Last Dose  . cyanocobalamin ((VITAMIN B-12)) injection 1,000 mcg  1,000 mcg Intramuscular Q30 days Steele Sizer, MD   1,000 mcg at 03/08/18 1100     PHYSICAL EXAMINATION: ECOG PERFORMANCE STATUS: 1 - Symptomatic but completely ambulatory Vitals:   03/11/18 1423  BP: 139/77  Pulse: 80  Resp: 18  Temp: (!) 96.7 F (35.9 C)   Filed Weights   03/11/18 1423  Weight: 126 lb 12.8 oz (57.5 kg)    Physical Exam  Constitutional: She is oriented to person, place, and time and well-developed, well-nourished, and in no distress. No distress.  HENT:  Head: Normocephalic and atraumatic.  Nose: Nose normal.  Mouth/Throat: Oropharynx  is clear and moist. No oropharyngeal exudate.  Eyes: Pupils are equal, round, and reactive to light. EOM are normal. Left eye exhibits no discharge. No scleral icterus.  Neck: Normal range of motion. Neck supple. No JVD present.  Cardiovascular: Normal rate, regular rhythm and normal heart sounds. Exam reveals no friction rub.  No murmur heard. Pulmonary/Chest: Effort normal and breath sounds normal. No respiratory distress. She has no wheezes. She has no rales. She exhibits no tenderness.  Abdominal: Soft. Bowel sounds are normal. She exhibits no distension. There is no abdominal tenderness.  Musculoskeletal: Normal range of motion.        General: No tenderness or edema.  Lymphadenopathy:    She has no cervical adenopathy.  Neurological: She is alert and oriented to person, place, and time. No cranial nerve deficit. She exhibits normal muscle tone. Coordination normal.  Skin: Skin  is warm and dry. No rash noted. She is not diaphoretic. No erythema.  Psychiatric: Affect and judgment normal.     LABORATORY DATA:  I have reviewed the data as listed Lab Results  Component Value Date   WBC 4.3 03/08/2018   HGB 10.3 (L) 03/08/2018   HCT 32.7 (L) 03/08/2018   MCV 91.6 03/08/2018   PLT 192 03/08/2018   Recent Labs    07/30/17 1100 12/17/17 1121  NA 137 138  K 3.7 4.2  CL 101 100  CO2 22 27  GLUCOSE 107* 99  BUN 18 19  CREATININE 1.01* 1.19*  CALCIUM 9.0 9.6  GFRNONAA 50* 42*  GFRAA 58* 49*  PROT  --  7.2  ALBUMIN 3.9  --   AST  --  16  ALT  --  11  BILITOT  --  0.4    Iron/TIBC/Ferritin/ %Sat    Component Value Date/Time   IRON 76 03/08/2018 1120   TIBC 321 03/08/2018 1120   FERRITIN 54 03/08/2018 1120   IRONPCTSAT 24 03/08/2018 1120   IRONPCTSAT 15 04/11/2016 1103   SPEP not observed M spike.   ASSESSMENT & PLAN:  1. Iron deficiency anemia due to chronic blood loss    # Iron deficiency anemia, Labs reviewed and discussed with patient.  Iron panel showed stable iron stores.   Ferritin level at 54.   Hemoglobin has slightly decreased from 11.1 to 10.3. I recommend patient to continue take oral iron supplement ferrous sulfate 325 mg twice daily.   She may have a component of anemia secondary to CKD as well. We will recheck iron TIBC ferritin CBC in 3 months.  May also give her additional IV iron if hemoglobin further drops.   Orders Placed This Encounter  Procedures  . Iron and TIBC    Standing Status:   Future    Standing Expiration Date:   03/12/2019  . CBC with Differential/Platelet    Standing Status:   Future    Standing Expiration Date:   03/12/2019  . Ferritin    Standing Status:   Future    Standing Expiration Date:   03/12/2019   All questions were answered. The patient knows to call the clinic with any problems questions or concerns.  Return of visit: 3 months.  Total face to face encounter time for this patient visit was  15 min. >50% of the time was  spent in counseling and coordination of care.    Earlie Server, MD, PhD Hematology Oncology [facility] Pager- [phone] 03/11/2018

## 2018-03-11 NOTE — Progress Notes (Signed)
Patient here for follow up. No concerns voiced.  °

## 2018-04-10 ENCOUNTER — Telehealth: Payer: Self-pay | Admitting: Family Medicine

## 2018-04-10 NOTE — Telephone Encounter (Signed)
I left a message for the patient explaining that there is currently an opening for AWV with [facility] right before her 04/19/2018 appt with Dr. [name].  I asked her to call and let us know if she would like to have both appointments on the same day if it's still available. VDM (DD)

## 2018-04-10 NOTE — Telephone Encounter (Signed)
Pt called back and scheduled AWV

## 2018-04-18 ENCOUNTER — Other Ambulatory Visit: Payer: Self-pay | Admitting: Family Medicine

## 2018-04-18 DIAGNOSIS — E785 Hyperlipidemia, unspecified: Secondary | ICD-10-CM

## 2018-04-19 ENCOUNTER — Ambulatory Visit: Payer: Medicare HMO

## 2018-04-19 ENCOUNTER — Encounter: Payer: Self-pay | Admitting: Family Medicine

## 2018-04-19 ENCOUNTER — Ambulatory Visit (INDEPENDENT_AMBULATORY_CARE_PROVIDER_SITE_OTHER): Payer: Medicare HMO | Admitting: Family Medicine

## 2018-04-19 VITALS — BP 102/64 | HR 86 | Temp 97.9°F | Resp 12 | Ht 61.0 in | Wt 122.1 lb

## 2018-04-19 DIAGNOSIS — D509 Iron deficiency anemia, unspecified: Secondary | ICD-10-CM

## 2018-04-19 DIAGNOSIS — E1122 Type 2 diabetes mellitus with diabetic chronic kidney disease: Secondary | ICD-10-CM | POA: Diagnosis not present

## 2018-04-19 DIAGNOSIS — N183 Chronic kidney disease, stage 3 unspecified: Secondary | ICD-10-CM

## 2018-04-19 DIAGNOSIS — I129 Hypertensive chronic kidney disease with stage 1 through stage 4 chronic kidney disease, or unspecified chronic kidney disease: Secondary | ICD-10-CM

## 2018-04-19 DIAGNOSIS — I10 Essential (primary) hypertension: Secondary | ICD-10-CM

## 2018-04-19 DIAGNOSIS — E538 Deficiency of other specified B group vitamins: Secondary | ICD-10-CM

## 2018-04-19 DIAGNOSIS — I480 Paroxysmal atrial fibrillation: Secondary | ICD-10-CM | POA: Diagnosis not present

## 2018-04-19 DIAGNOSIS — N182 Chronic kidney disease, stage 2 (mild): Secondary | ICD-10-CM

## 2018-04-19 DIAGNOSIS — E785 Hyperlipidemia, unspecified: Secondary | ICD-10-CM

## 2018-04-19 LAB — POCT GLYCOSYLATED HEMOGLOBIN (HGB A1C)
HbA1c POC (<> result, manual entry): 6.4 % (ref 4.0–5.6)
HbA1c, POC (controlled diabetic range): 6.4 % (ref 0.0–7.0)
HbA1c, POC (prediabetic range): 6.4 % (ref 5.7–6.4)
Hemoglobin A1C: 6.4 % — AB (ref 4.0–5.6)

## 2018-04-19 MED ORDER — CYANOCOBALAMIN 1000 MCG/ML IJ SOLN
1000.0000 ug | Freq: Once | INTRAMUSCULAR | Status: AC
  Administered 2018-04-19: 1000 ug via INTRAMUSCULAR

## 2018-04-19 MED ORDER — METFORMIN HCL ER 750 MG PO TB24: 750.0000 mg | ORAL_TABLET | Freq: Every evening | ORAL | 1 refills | Status: DC

## 2018-04-19 MED ORDER — HYDROCHLOROTHIAZIDE 12.5 MG PO TABS: 12.5000 mg | ORAL_TABLET | Freq: Every day | ORAL | 1 refills | Status: DC

## 2018-04-19 MED ORDER — AMLODIPINE BESYLATE-VALSARTAN 5-320 MG PO TABS: 1.0000 | ORAL_TABLET | Freq: Every day | ORAL | 1 refills | Status: DC

## 2018-04-19 NOTE — Telephone Encounter (Signed)
Refill Request for Cholesterol medication. Rosuvastatin 5 mg  Last physical: 05/18/2017  Lab Results  Component Value Date   CHOL 270 (H) 12/17/2017   HDL 74 12/17/2017   LDLCALC 175 (H) 12/17/2017   TRIG 92 12/17/2017   CHOLHDL 3.6 12/17/2017   Follow up: 08/26/2018

## 2018-04-19 NOTE — Progress Notes (Signed)
Name: Emma Campbell   MRN: [id number]    DOB: 1932-08-07   Date:04/19/2018       Progress Note  Subjective  Chief Complaint  Chief Complaint  Patient presents with  . Medication Refill    4 month F/U  . Diabetes  . Hypertension  . Hyperlipidemia  . Osteopenia  . Anemia    HPI  DMII with renal manifestation: CKI and microalbuminuria. HgbA1C has beenstable and today it was 6.4%She denies hypoglycemia episodes. She feels well. Glucose at home is getting checked a couple of times weekly , values between 113-131 She denies polyphagia, polydipsia or polyuria ( except right after she takes HCTZ) . Eye exam scheduled for Oct 30 th. Continue ARB for kidney protection. Eye exam is up to date  HTN: taking medicationdaily and bp is at goal, no dizziness, chest pain or palpitation. BP towards low end of normal but it was 139/74 during recent visit to hematologist, we will decreased Hctz to 12.5 mg and monitor, she denies orthostatic changes  Hyperlipidemia: she was not taking taking Crestor5 mg on her last visit and LDL went up to 175, we will recheck level today and if still high I will increase dose to 10 mg daily   Sick Sinus Syndrome: she had a pacemaker placed in 2006 and has been on Amiodarone since 2006, she is now down to 100 mg of Amiodarone daily, last TSH was back to normal. No chest pain or palpitation.Sees Dr. Clayborn Campbell, last visit 01/2018, she is [name] score of 2, and is on aspirin only per his recommendation.She is at a high risk of falls because of age and does not want anti-coagulant. She also has a history of Afib.   Vitamin D deficiency: still taking supplementation, denies fatigue, last Vitamin D was at goal.   Osteopenia: used to take Fosamax but has been off and last bone density reviewed with patient, low FRAX score, continue high calcium diet and vitamin D supplementation, she has not been walking lately, but will she will try to resume it again.   Anemia: iron  deficiency and positive hemoccult, seen by Dr. Durwin Campbell andhadEGD and colonoscopy September 2018,diagnosed with AVM small bowel and had it cauterized,hemoglobin dropped and was referred to Dr. Tasia Campbell, labs are being monitored by her, last HCT was in the 10's. She is going to have another iron infusion soon.  B12 deficiency: getting supplementation, B12 injections today. Denies fatigue.    Patient Active Problem List   Diagnosis Date Noted  . B12 deficiency 05/21/2017  . Gastrointestinal tract imaging abnormality   . Iron deficiency anemia   . Polyp of sigmoid colon   . Type 2 diabetes mellitus with stage 2 chronic kidney disease, without long-term current use of insulin ([facility]) 08/03/2015  . Arthritis, degenerative 05/03/2015  . Menopausal and perimenopausal disorder 05/03/2015  . Chronic kidney disease (CKD), stage III (moderate) (Emma Campbell) 11/30/2014  . High risk medication use 11/30/2014  . Diverticulosis 11/30/2014  . Fibrocystic breast disease 11/30/2014  . Elevated LFTs 11/30/2014  . Systolic ejection murmur 11/94/1740  . Paroxysmal A-fib ([place]) 11/30/2014  . Microalbuminuria 07/31/2014  . Elevated TSH 07/31/2014  . History of cardiac pacemaker 07/31/2014  . Sick sinus syndrome (Emma Campbell) 08/13/2013  . Hypercholesteremia 08/13/2013  . Hypertension, benign 08/13/2013  . Diabetes mellitus with renal manifestations, controlled ([place]) 08/13/2013  . Avitaminosis D 04/12/2009    Past Surgical History:  Procedure Laterality Date  . COLONOSCOPY WITH PROPOFOL N/A 11/06/2016   Procedure: COLONOSCOPY  WITH PROPOFOL;  Surgeon: Emma Lame, MD;  Location: [facility];  Service: [facility];  Laterality: N/A;  DIABETIC  . ESOPHAGOGASTRODUODENOSCOPY (EGD) WITH PROPOFOL N/A 11/06/2016   Procedure: ESOPHAGOGASTRODUODENOSCOPY (EGD) WITH PROPOFOL;  Surgeon: Emma Lame, MD;  Location: [facility];  Service: [facility];  Laterality: N/A;  . ESOPHAGOGASTRODUODENOSCOPY (EGD) WITH PROPOFOL N/A  01/23/2017   Procedure: ESOPHAGOGASTRODUODENOSCOPY (EGD) WITH PROPOFOL with PUSH;  Surgeon: Emma Lame, MD;  Location: [facility];  Service: [facility];  Laterality: N/A;  . GIVENS CAPSULE STUDY N/A 12/22/2016   Procedure: GIVENS CAPSULE STUDY;  Surgeon: Emma Lame, MD;  Location: [facility];  Service: [facility];  Laterality: N/A;  . PACEMAKER INSERTION  2006  . POLYPECTOMY  11/06/2016   Procedure: POLYPECTOMY;  Surgeon: Emma Lame, MD;  Location: [facility];  Service: [facility];;    Family History  Problem Relation Age of Onset  . Hypertension Mother   . Stroke Mother   . Diabetes Daughter   . Stroke Father   . Healthy Sister   . Hypertension Maternal Grandmother     Social History   Socioeconomic History  . Marital status: Widowed    Spouse name: Shanon Brow  . Number of children: 1  . Years of education: 2 years of college  . Highest education level: 12th grade  Occupational History  . Occupation: Retired Agricultural engineer at [employer]  . Financial resource strain: Not hard at all  . Food insecurity:    Worry: Never true    Inability: Never true  . Transportation needs:    Medical: No    Non-medical: No  Tobacco Use  . Smoking status: Never Smoker  . Smokeless tobacco: Never Used  . Tobacco comment: smoking cessation materials not required  Substance and Sexual Activity  . Alcohol use: No    Alcohol/week: 0.0 standard drinks  . Drug use: No  . Sexual activity: Never  Lifestyle  . Physical activity:    Days per week: 7 days    Minutes per session: 30 min  . Stress: Not at all  Relationships  . Social connections:    Talks on phone: More than three times a week    Gets together: More than three times a week    Attends religious service: More than 4 times per year    Active member of club or organization: Yes    Attends meetings of clubs or organizations: Never    Relationship status: Widowed  . Intimate partner violence:    Fear  of current or ex partner: No    Emotionally abused: No    Physically abused: No    Forced sexual activity: No  Other Topics Concern  . Not on file  Social History Narrative   She still has a house but her daughter and grand-daughter are staying at her house   She moved in with her older sister ( 57 years older), to help her out, and now they are watching her twin  great-grand-daughters ( they were born in 2019 ), watching them for about 4 hours a few days a week     Current Outpatient Medications:  .  amiodarone (PACERONE) 200 MG tablet, Take 100 mg by mouth daily., Disp: , Rfl:  .  amLODipine-valsartan (EXFORGE) 5-320 MG tablet, Take 1 tablet by mouth daily., Disp: 90 tablet, Rfl: 1 .  aspirin 81 MG tablet, Take 81 mg by mouth daily., Disp: , Rfl:  .  cholecalciferol (VITAMIN D) 1000 UNITS tablet,  Take 200 Units by mouth daily., Disp: , Rfl:  .  ferrous sulfate 325 (65 FE) MG EC tablet, Take 1 tablet (325 mg total) by mouth 2 (two) times daily with a meal., Disp: 180 tablet, Rfl: 1 .  folic acid (FOLVITE) 185 MCG tablet, Take 400 mcg by mouth daily., Disp: , Rfl:  .  hydrochlorothiazide (HYDRODIURIL) 12.5 MG tablet, Take 1 tablet (12.5 mg total) by mouth daily., Disp: 90 tablet, Rfl: 1 .  metFORMIN (GLUCOPHAGE-XR) 750 MG 24 hr tablet, Take 1 tablet (750 mg total) by mouth every evening., Disp: 90 tablet, Rfl: 1 .  rosuvastatin (CRESTOR) 5 MG tablet, TAKE 1 TABLET EVERY DAY, Disp: 90 tablet, Rfl: 0  Current Facility-Administered Medications:  .  cyanocobalamin ((VITAMIN B-12)) injection 1,000 mcg, 1,000 mcg, Intramuscular, Q30 days, Steele Sizer, MD, 1,000 mcg at 03/08/18 1100  Allergies  Allergen Reactions  . Ace Inhibitors Other (See Comments)    Pt unable to recall. Was advised to avoid    I personally reviewed active problem list, medication list, allergies, family history, social history with the patient/caregiver today.   ROS  Constitutional: Negative for fever or weight  change.  Respiratory: Negative for cough and shortness of breath.   Cardiovascular: Negative for chest pain or palpitations.  Gastrointestinal: Negative for abdominal pain, no bowel changes.  Musculoskeletal: Negative for gait problem or joint swelling.  Skin: Negative for rash.  Neurological: Negative for dizziness or headache.  No other specific complaints in a complete review of systems (except as listed in HPI above).  Objective  Vitals:   04/19/18 1020  BP: 102/64  Pulse: 86  Resp: 12  Temp: 97.9 F (36.6 C)  TempSrc: Oral  SpO2: 96%  Weight: 122 lb 1.6 oz (55.4 kg)  Height: 5\' 1"  (1.549 m)    Body mass index is 23.07 kg/m.  Physical Exam  Constitutional: Patient appears well-developed and well-nourished.  No distress.  HEENT: head atraumatic, normocephalic, pupils equal and reactive to light, neck supple, throat within normal limits Cardiovascular: Normal rate, regular rhythm and normal heart sounds.  3/6 systolic murmur heard. No BLE edema. Pulmonary/Chest: Effort normal and breath sounds normal. No respiratory distress. Abdominal: Soft.  There is no tenderness. Psychiatric: Patient has a normal mood and affect. behavior is normal. Judgment and thought content normal.  Recent Results (from the past 2160 hour(s))  Ferritin     Status: None   Collection Time: 03/08/18 11:20 AM  Result Value Ref Range   Ferritin 54 11 - 307 ng/mL    Comment: Performed at [facility], [address] [place], [place] [address]  Iron and TIBC     Status: None   Collection Time: 03/08/18 11:20 AM  Result Value Ref Range   Iron 76 28 - 170 ug/dL   TIBC 321 250 - 450 ug/dL   Saturation Ratios 24 10.4 - 31.8 %   UIBC 245 ug/dL    Comment: Performed at [facility], [address] [place], [place] [address]  CBC with Differential/Platelet     Status: Abnormal   Collection Time: 03/08/18 11:20 AM  Result Value Ref Range   WBC 4.3 4.0 - 10.5 K/uL   RBC 3.57 (L)  3.87 - 5.11 MIL/uL   Hemoglobin 10.3 (L) 12.0 - 15.0 g/dL   HCT 32.7 (L) 36.0 - 46.0 %   MCV 91.6 80.0 - 100.0 fL   MCH 28.9 26.0 - 34.0 pg   MCHC 31.5 30.0 - 36.0 g/dL  RDW 13.1 11.5 - 15.5 %   Platelets 192 150 - 400 K/uL   nRBC 0.0 0.0 - 0.2 %   Neutrophils Relative % 49 %   Neutro Abs 2.1 1.7 - 7.7 K/uL   Lymphocytes Relative 39 %   Lymphs Abs 1.7 0.7 - 4.0 K/uL   Monocytes Relative 7 %   Monocytes Absolute 0.3 0.1 - 1.0 K/uL   Eosinophils Relative 4 %   Eosinophils Absolute 0.2 0.0 - 0.5 K/uL   Basophils Relative 1 %   Basophils Absolute 0.0 0.0 - 0.1 K/uL   Immature Granulocytes 0 %   Abs Immature Granulocytes 0.01 0.00 - 0.07 K/uL    Comment: Performed at [facility], [address] [place], [place] [address]  POCT HgB A1C     Status: Abnormal   Collection Time: 04/19/18 10:27 AM  Result Value Ref Range   Hemoglobin A1C 6.4 (A) 4.0 - 5.6 %   HbA1c POC (<> result, manual entry) 6.4 4.0 - 5.6 %   HbA1c, POC (prediabetic range) 6.4 5.7 - 6.4 %   HbA1c, POC (controlled diabetic range) 6.4 0.0 - 7.0 %     PHQ2/9: Depression screen [facility] 2/9 04/19/2018 12/17/2017 08/15/2017 05/18/2017 05/18/2017  Decreased Interest 0 0 0 0 0  Down, Depressed, Hopeless 0 0 0 0 0  PHQ - 2 Score 0 0 0 0 0  Altered sleeping 0 - - 0 -  Tired, decreased energy 0 - - 0 -  Change in appetite 0 - - 0 -  Feeling bad or failure about yourself  0 - - 0 -  Trouble concentrating 0 - - 0 -  Moving slowly or fidgety/restless 0 - - 0 -  Suicidal thoughts 0 - - 0 -  PHQ-9 Score 0 - - 0 -  Difficult doing work/chores Not difficult at all - - Not difficult at all -     Fall Risk: Fall Risk  04/19/2018 04/19/2018 12/17/2017 08/15/2017 05/18/2017  Falls in the past year? 0 0 No No Yes  Number falls in past yr: 0 - - - 1  Injury with Fall? 0 - - - Yes  Comment - - - - -  Risk for fall due to : - - - - History of fall(s)  Risk for fall due to: Comment - - - - -  Follow up - - - - Falls prevention  discussed     Functional Status Survey: Is the patient deaf or have difficulty hearing?: No Does the patient have difficulty seeing, even when wearing glasses/contacts?: No Does the patient have difficulty concentrating, remembering, or making decisions?: No Does the patient have difficulty walking or climbing stairs?: No Does the patient have difficulty dressing or bathing?: No Does the patient have difficulty doing errands alone such as visiting a doctor's office or shopping?: No   Assessment & Plan  1. Type 2 diabetes mellitus with stage 3 chronic kidney disease, without long-term current use of insulin (HCC)  - POCT HgB A1C - amLODipine-valsartan (EXFORGE) 5-320 MG tablet; Take 1 tablet by mouth daily.  Dispense: 90 tablet; Refill: 1 - metFORMIN (GLUCOPHAGE-XR) 750 MG 24 hr tablet; Take 1 tablet (750 mg total) by mouth every evening.  Dispense: 90 tablet; Refill: 1  2. B12 deficiency  - cyanocobalamin ((VITAMIN B-12)) injection 1,000 mcg  3. Paroxysmal A-fib (HCC)  Rate controlled  4. Chronic kidney disease, stage III (moderate) ([place])  Recheck labs   5. Iron deficiency anemia, unspecified  iron deficiency anemia type   6. Hypertension, benign  - hydrochlorothiazide (HYDRODIURIL) 12.5 MG tablet; Take 1 tablet (12.5 mg total) by mouth daily.  Dispense: 90 tablet; Refill: 1 - amLODipine-valsartan (EXFORGE) 5-320 MG tablet; Take 1 tablet by mouth daily.  Dispense: 90 tablet; Refill: 1  7. Dyslipidemia  - Lipid panel  8. Hypertension associated with stage 3 chronic kidney disease due to type 2 diabetes mellitus (HCC)  - COMPLETE METABOLIC PANEL WITH GFR  9. Type 2 diabetes mellitus with stage 2 chronic kidney disease, without long-term current use of insulin (HCC)  - amLODipine-valsartan (EXFORGE) 5-320 MG tablet; Take 1 tablet by mouth daily.  Dispense: 90 tablet; Refill: 1 - metFORMIN (GLUCOPHAGE-XR) 750 MG 24 hr tablet; Take 1 tablet (750 mg total) by mouth every  evening.  Dispense: 90 tablet; Refill: 1

## 2018-04-20 LAB — COMPLETE METABOLIC PANEL WITH GFR
AG Ratio: 1.8 (calc) (ref 1.0–2.5)
ALT: 12 U/L (ref 6–29)
AST: 17 U/L (ref 10–35)
Albumin: 4.4 g/dL (ref 3.6–5.1)
Alkaline phosphatase (APISO): 51 U/L (ref 37–153)
BUN/Creatinine Ratio: 16 (calc) (ref 6–22)
BUN: 16 mg/dL (ref 7–25)
CO2: 26 mmol/L (ref 20–32)
Calcium: 9.3 mg/dL (ref 8.6–10.4)
Chloride: 103 mmol/L (ref 98–110)
Creat: 1.03 mg/dL — ABNORMAL HIGH (ref 0.60–0.88)
GFR, Est African American: 58 mL/min/{1.73_m2} — ABNORMAL LOW (ref >=60)
GFR, Est Non African American: 50 mL/min/{1.73_m2} — ABNORMAL LOW (ref >=60)
GLUCOSE: 105 mg/dL — AB (ref 65–99)
Globulin: 2.5 g/dL (calc) (ref 1.9–3.7)
Potassium: 4.2 mmol/L (ref 3.5–5.3)
Sodium: 140 mmol/L (ref 135–146)
Total Bilirubin: 0.5 mg/dL (ref 0.2–1.2)
Total Protein: 6.9 g/dL (ref 6.1–8.1)

## 2018-04-20 LAB — LIPID PANEL
Cholesterol: 197 mg/dL (ref <200)
HDL: 84 mg/dL (ref >=50)
LDL Cholesterol (Calc): 97 mg/dL (calc)
Non-HDL Cholesterol (Calc): 113 mg/dL (calc) (ref <130)
Total CHOL/HDL Ratio: 2.3 (calc) (ref <5.0)
Triglycerides: 75 mg/dL (ref <150)

## 2018-04-25 ENCOUNTER — Ambulatory Visit: Payer: Medicare HMO

## 2018-05-20 ENCOUNTER — Telehealth: Payer: Self-pay

## 2018-05-20 NOTE — Telephone Encounter (Signed)
Contacted patient to reschedule her wellness visit for tomorrow due to she was the only patient on the schedule and patient complains of cough and cold for the past 3 weeks. She states she left a message on the voicemail last week but no one has returned her call. No telephone messages in chart. Patient denies fever, chills or coughing up mucous. She states Dr. [name] has given her cough medicine before and wants to know if it can be prescribed to avoid coming into the office. Please contact patient or advise CMA to do so as I am only available on Epic for a limited time.   Thank you!

## 2018-05-21 ENCOUNTER — Ambulatory Visit (INDEPENDENT_AMBULATORY_CARE_PROVIDER_SITE_OTHER): Payer: Medicare HMO | Admitting: Family Medicine

## 2018-05-21 ENCOUNTER — Other Ambulatory Visit: Payer: Self-pay

## 2018-05-21 ENCOUNTER — Encounter: Payer: Self-pay | Admitting: Family Medicine

## 2018-05-21 VITALS — BP 150/50 | HR 96 | Temp 98.4°F | Resp 18 | Ht 61.0 in | Wt 118.0 lb

## 2018-05-21 DIAGNOSIS — E538 Deficiency of other specified B group vitamins: Secondary | ICD-10-CM | POA: Diagnosis not present

## 2018-05-21 DIAGNOSIS — I10 Essential (primary) hypertension: Secondary | ICD-10-CM

## 2018-05-21 DIAGNOSIS — R630 Anorexia: Secondary | ICD-10-CM | POA: Diagnosis not present

## 2018-05-21 DIAGNOSIS — R059 Cough, unspecified: Secondary | ICD-10-CM

## 2018-05-21 DIAGNOSIS — E1122 Type 2 diabetes mellitus with diabetic chronic kidney disease: Secondary | ICD-10-CM | POA: Diagnosis not present

## 2018-05-21 DIAGNOSIS — N183 Chronic kidney disease, stage 3 (moderate): Secondary | ICD-10-CM

## 2018-05-21 DIAGNOSIS — R05 Cough: Secondary | ICD-10-CM

## 2018-05-21 DIAGNOSIS — E785 Hyperlipidemia, unspecified: Secondary | ICD-10-CM

## 2018-05-21 MED ORDER — BENZONATATE 100 MG PO CAPS: 100.0000 mg | ORAL_CAPSULE | Freq: Two times a day (BID) | ORAL | 0 refills | Status: DC | PRN

## 2018-05-21 MED ORDER — ATORVASTATIN CALCIUM 20 MG PO TABS: 20.0000 mg | ORAL_TABLET | Freq: Every day | ORAL | 1 refills | Status: DC

## 2018-05-21 MED ORDER — CEFTRIAXONE SODIUM 1 G IJ SOLR
1.0000 g | Freq: Once | INTRAMUSCULAR | Status: AC
  Administered 2018-05-21: 1 g via INTRAMUSCULAR

## 2018-05-21 MED ORDER — AZITHROMYCIN 250 MG PO TABS: ORAL_TABLET | ORAL | 0 refills | Status: DC

## 2018-05-21 NOTE — Telephone Encounter (Signed)
Spoke with pt and scheduled her an appt with Dr [name] for this afternoon

## 2018-05-21 NOTE — Progress Notes (Signed)
Name: Emma Campbell   MRN: [id number]    DOB: [birth year]   Date:05/21/2018       Progress Note  Subjective  Chief Complaint  Chief Complaint  Patient presents with  . Cough    dry cough x 3 weeks that is rapidly worsening  . Medication Management    insurance will no longer cover Rosuvastatin.    HPI  Cough: she states symptoms started straight in her chest with a cough , never been productive, no fever or chills. She denies rhinorrhea, nasal congestion, wheezing or SOB. She denies heartburn or change in medication , no dysphagia but has noticed lack of appetite and lost 4 lbs in the past few weeks.   Hyperlipidemia: insurance is no longer paying for Crestor, we will try sending atorvastatin , denies myalgias or chest pain  HTN: bp is usually controlled, but is elevated today, she states she is feeling tired, no appetite and just does not feel good.  DMII: she has not been checking her glucose lately, but taking medication Last A1C at goal   Anemia : going on for years, under the care of hematologist, had colonoscopy and EGD, I would like to check a CXR and consider CT chest but because of COVID-19 we will treat for CAP and if no resolution of symptoms we will order tests for her. Dx:  cough , hoarseness and anemia    Patient Active Problem List   Diagnosis Date Noted  . B12 deficiency 05/21/2017  . Gastrointestinal tract imaging abnormality   . Iron deficiency anemia   . Polyp of sigmoid colon   . Type 2 diabetes mellitus with stage 2 chronic kidney disease, without long-term current use of insulin ([place]) 08/03/2015  . Arthritis, degenerative 05/03/2015  . Menopausal and perimenopausal disorder 05/03/2015  . Chronic kidney disease (CKD), stage III (moderate) ([place]) 11/30/2014  . High risk medication use 11/30/2014  . Diverticulosis 11/30/2014  . Fibrocystic breast disease 11/30/2014  . Elevated LFTs 11/30/2014  . Systolic ejection murmur 00/34/9179  . Paroxysmal A-fib ([place])  11/30/2014  . Microalbuminuria 07/31/2014  . Elevated TSH 07/31/2014  . History of cardiac pacemaker 07/31/2014  . Sick sinus syndrome ([place]) 08/13/2013  . Hypercholesteremia 08/13/2013  . Hypertension, benign 08/13/2013  . Diabetes mellitus with renal manifestations, controlled ([place]) 08/13/2013  . Avitaminosis D 04/12/2009    Past Surgical History:  Procedure Laterality Date  . COLONOSCOPY WITH PROPOFOL N/A 11/06/2016   Procedure: COLONOSCOPY WITH PROPOFOL;  Surgeon: Lucilla Lame, MD;  Location: [place];  Service: Endoscopy;  Laterality: N/A;  DIABETIC  . ESOPHAGOGASTRODUODENOSCOPY (EGD) WITH PROPOFOL N/A 11/06/2016   Procedure: ESOPHAGOGASTRODUODENOSCOPY (EGD) WITH PROPOFOL;  Surgeon: Lucilla Lame, MD;  Location: [place];  Service: Endoscopy;  Laterality: N/A;  . ESOPHAGOGASTRODUODENOSCOPY (EGD) WITH PROPOFOL N/A 01/23/2017   Procedure: ESOPHAGOGASTRODUODENOSCOPY (EGD) WITH PROPOFOL with PUSH;  Surgeon: Lucilla Lame, MD;  Location: [facility] ENDOSCOPY;  Service: Endoscopy;  Laterality: N/A;  . GIVENS CAPSULE STUDY N/A 12/22/2016   Procedure: GIVENS CAPSULE STUDY;  Surgeon: Lucilla Lame, MD;  [facility] ENDOSCOPY;  Service: Endoscopy;  Laterality: N/A;  . PACEMAKER INSERTION  2006  . POLYPECTOMY  11/06/2016   Procedure: POLYPECTOMY;  Surgeon: Lucilla Lame, MD;  Location: [place];  Service: Endoscopy;;    Family History  Problem Relation Age of Onset  . Hypertension Mother   . Stroke Mother   . Diabetes Daughter   . Stroke Father   . Healthy Sister   .  Hypertension Maternal Grandmother     Social History   Socioeconomic History  . Marital status: Widowed    Spouse name: Shanon Brow  . Number of children: 1  . Years of education: 2 years of college  . Highest education level: 12th grade  Occupational History  . Occupation: Retired Agricultural engineer at [employer]  . Financial resource strain: Not hard at all  . Food insecurity:     Worry: Never true    Inability: Never true  . Transportation needs:    Medical: No    Non-medical: No  Tobacco Use  . Smoking status: Never Smoker  . Smokeless tobacco: Never Used  . Tobacco comment: smoking cessation materials not required  Substance and Sexual Activity  . Alcohol use: No    Alcohol/week: 0.0 standard drinks  . Drug use: No  . Sexual activity: Never  Lifestyle  . Physical activity:    Days per week: 7 days    Minutes per session: 30 min  . Stress: Not at all  Relationships  . Social connections:    Talks on phone: More than three times a week    Gets together: More than three times a week    Attends religious service: More than 4 times per year    Active member of club or organization: Yes    Attends meetings of clubs or organizations: Never    Relationship status: Widowed  . Intimate partner violence:    Fear of current or ex partner: No    Emotionally abused: No    Physically abused: No    Forced sexual activity: No  Other Topics Concern  . Not on file  Social History Narrative   She still has a house but her daughter and grand-daughter are staying at her house   She moved in with her older sister ( 40 years older), to help her out, and now they are watching her twin  great-grand-daughters ( they were born in 2019 ), watching them for about 4 hours a few days a week     Current Outpatient Medications:  .  amiodarone (PACERONE) 200 MG tablet, Take 100 mg by mouth daily., Disp: , Rfl:  .  amLODipine-valsartan (EXFORGE) 5-320 MG tablet, Take 1 tablet by mouth daily., Disp: 90 tablet, Rfl: 1 .  aspirin 81 MG tablet, Take 81 mg by mouth daily., Disp: , Rfl:  .  cholecalciferol (VITAMIN D) 1000 UNITS tablet, Take 200 Units by mouth daily., Disp: , Rfl:  .  ferrous sulfate 325 (65 FE) MG EC tablet, Take 1 tablet (325 mg total) by mouth 2 (two) times daily with a meal., Disp: 180 tablet, Rfl: 1 .  folic acid (FOLVITE) 119 MCG tablet, Take 400 mcg by mouth  daily., Disp: , Rfl:  .  hydrochlorothiazide (HYDRODIURIL) 12.5 MG tablet, Take 1 tablet (12.5 mg total) by mouth daily., Disp: 90 tablet, Rfl: 1 .  metFORMIN (GLUCOPHAGE-XR) 750 MG 24 hr tablet, Take 1 tablet (750 mg total) by mouth every evening., Disp: 90 tablet, Rfl: 1 .  atorvastatin (LIPITOR) 20 MG tablet, Take 1 tablet (20 mg total) by mouth daily., Disp: 90 tablet, Rfl: 1 .  azithromycin (ZITHROMAX) 250 MG tablet, Take as directed, Disp: 6 tablet, Rfl: 0 .  benzonatate (TESSALON) 100 MG capsule, Take 1-2 capsules (100-200 mg total) by mouth 2 (two) times daily as needed., Disp: 40 capsule, Rfl: 0  Current Facility-Administered Medications:  .  cefTRIAXone (ROCEPHIN) injection 1 g,  1 g, Intramuscular, Once, Amiah Frohlich, Drue Stager, MD .  cyanocobalamin ((VITAMIN B-12)) injection 1,000 mcg, 1,000 mcg, Intramuscular, Q30 days, Steele Sizer, MD, 1,000 mcg at 03/08/18 1100  Allergies  Allergen Reactions  . Ace Inhibitors Other (See Comments)    Pt unable to recall. Was advised to avoid    I personally reviewed active problem list, medication list, allergies, family history, social history with the patient/caregiver today.   ROS  Constitutional: Negative for fever or significant weight change.  Respiratory: positive for cough but no  shortness of breath.   Cardiovascular: Negative for chest pain or palpitations.  Gastrointestinal: Negative for abdominal pain, no bowel changes.  Musculoskeletal: Negative for gait problem or joint swelling.  Skin: Negative for rash.  Neurological: Negative for dizziness or headache.  No other specific complaints in a complete review of systems (except as listed in HPI above).  Objective  Vitals:   05/21/18 1410  BP: (!) 150/50  Pulse: 96  Resp: 18  Temp: 98.4 F (36.9 C)  TempSrc: Oral  SpO2: 97%  Weight: 118 lb (53.5 kg)  Height: 5\' 1"  (1.549 m)    Body mass index is 22.3 kg/m.  Physical Exam  Constitutional: Patient appears  well-developed and well-nourished.  HEENT: head atraumatic, normocephalic, pupils equal and reactive to light, ears normal TM,  neck supple, throat within normal limits, yellow drainage from nostril Cardiovascular: Normal rate, regular rhythm and normal heart sounds.  No murmur heard. No BLE edema. Pulmonary/Chest: Effort normal , rhonchi both lung fields. Mild tachypnea Abdominal: Soft.  There is no tenderness. Psychiatric: Patient has a normal mood and affect. behavior is normal. Judgment and thought content normal.  Recent Results (from the past 2160 hour(s))  Ferritin     Status: None   Collection Time: 03/08/18 11:20 AM  Result Value Ref Range   Ferritin 54 11 - 307 ng/mL    Comment: Performed at [facility], [address] [place], [place] [address]  Iron and TIBC     Status: None   Collection Time: 03/08/18 11:20 AM  Result Value Ref Range   Iron 76 28 - 170 ug/dL   TIBC 321 250 - 450 ug/dL   Saturation Ratios 24 10.4 - 31.8 %   UIBC 245 ug/dL    Comment: Performed at [facility], [address] [place], [place] [address]  CBC with Differential/Platelet     Status: Abnormal   Collection Time: 03/08/18 11:20 AM  Result Value Ref Range   WBC 4.3 4.0 - 10.5 K/uL   RBC 3.57 (L) 3.87 - 5.11 MIL/uL   Hemoglobin 10.3 (L) 12.0 - 15.0 g/dL   HCT 32.7 (L) 36.0 - 46.0 %   MCV 91.6 80.0 - 100.0 fL   MCH 28.9 26.0 - 34.0 pg   MCHC 31.5 30.0 - 36.0 g/dL   RDW 13.1 11.5 - 15.5 %   Platelets 192 150 - 400 K/uL   nRBC 0.0 0.0 - 0.2 %   Neutrophils Relative % 49 %   Neutro Abs 2.1 1.7 - 7.7 K/uL   Lymphocytes Relative 39 %   Lymphs Abs 1.7 0.7 - 4.0 K/uL   Monocytes Relative 7 %   Monocytes Absolute 0.3 0.1 - 1.0 K/uL   Eosinophils Relative 4 %   Eosinophils Absolute 0.2 0.0 - 0.5 K/uL   Basophils Relative 1 %   Basophils Absolute 0.0 0.0 - 0.1 K/uL   Immature Granulocytes 0 %   Abs Immature Granulocytes 0.01 0.00 -  0.07 K/uL    Comment: Performed at [facility], [place], [place] [address]  POCT HgB A1C     Status: Abnormal   Collection Time: 04/19/18 10:27 AM  Result Value Ref Range   Hemoglobin A1C 6.4 (A) 4.0 - 5.6 %   HbA1c POC (<> result, manual entry) 6.4 4.0 - 5.6 %   HbA1c, POC (prediabetic range) 6.4 5.7 - 6.4 %   HbA1c, POC (controlled diabetic range) 6.4 0.0 - 7.0 %  Lipid panel     Status: None   Collection Time: 04/19/18 10:44 AM  Result Value Ref Range   Cholesterol 197 <200 mg/dL   HDL 84 > OR = 50 mg/dL   Triglycerides 75 <150 mg/dL   LDL Cholesterol (Calc) 97 mg/dL (calc)    Comment: Reference range: <100 . Desirable range <100 mg/dL for primary prevention;   <70 mg/dL for patients with CHD or diabetic patients  with > or = 2 CHD risk factors. Marland Kitchen LDL-C is now calculated using the Martin-Hopkins  calculation, which is a validated novel method providing  better accuracy than the Friedewald equation in the  estimation of LDL-C.  Cresenciano Genre et al. Annamaria Helling. 1914;782(95): 2061-2068  (http://education.[employer].com/faq/FAQ164)    Total CHOL/HDL Ratio 2.3 <5.0 (calc)   Non-HDL Cholesterol (Calc) 113 <130 mg/dL (calc)    Comment: For patients with diabetes plus 1 major ASCVD risk  factor, treating to a non-HDL-C goal of <100 mg/dL  (LDL-C of <70 mg/dL) is considered a therapeutic  option.   COMPLETE METABOLIC PANEL WITH GFR     Status: Abnormal   Collection Time: 04/19/18 10:44 AM  Result Value Ref Range   Glucose, Bld 105 (H) 65 - 99 mg/dL    Comment: .            Fasting reference interval . For someone without known diabetes, a glucose value between 100 and 125 mg/dL is consistent with prediabetes and should be confirmed with a follow-up test. .    BUN 16 7 - 25 mg/dL   Creat 1.03 (H) 0.60 - 0.88 mg/dL    Comment: For patients >70 years of age, the reference limit for Creatinine is approximately 13% higher for people identified as African-American. .    GFR, Est Non African  American 50 (L) > OR = 60 mL/min/1.90m2   GFR, Est African American 58 (L) > OR = 60 mL/min/1.55m2   BUN/Creatinine Ratio 16 6 - 22 (calc)   Sodium 140 135 - 146 mmol/L   Potassium 4.2 3.5 - 5.3 mmol/L   Chloride 103 98 - 110 mmol/L   CO2 26 20 - 32 mmol/L   Calcium 9.3 8.6 - 10.4 mg/dL   Total Protein 6.9 6.1 - 8.1 g/dL   Albumin 4.4 3.6 - 5.1 g/dL   Globulin 2.5 1.9 - 3.7 g/dL (calc)   AG Ratio 1.8 1.0 - 2.5 (calc)   Total Bilirubin 0.5 0.2 - 1.2 mg/dL   Alkaline phosphatase (APISO) 51 37 - 153 U/L   AST 17 10 - 35 U/L   ALT 12 6 - 29 U/L      PHQ2/9: Depression screen [facility] 2/9 04/19/2018 12/17/2017 08/15/2017 05/18/2017 05/18/2017  Decreased Interest 0 0 0 0 0  Down, Depressed, Hopeless 0 0 0 0 0  PHQ - 2 Score 0 0 0 0 0  Altered sleeping 0 - - 0 -  Tired, decreased energy 0 - - 0 -  Change in appetite 0 - -  0 -  Feeling bad or failure about yourself  0 - - 0 -  Trouble concentrating 0 - - 0 -  Moving slowly or fidgety/restless 0 - - 0 -  Suicidal thoughts 0 - - 0 -  PHQ-9 Score 0 - - 0 -  Difficult doing work/chores Not difficult at all - - Not difficult at all -     Fall Risk: Fall Risk  04/19/2018 04/19/2018 12/17/2017 08/15/2017 05/18/2017  Falls in the past year? 0 0 No No Yes  Number falls in past yr: 0 - - - 1  Injury with Fall? 0 - - - Yes  Comment - - - - -  Risk for fall due to : - - - - History of fall(s)  Risk for fall due to: Comment - - - - -  Follow up - - - - Falls prevention discussed      Assessment & Plan  1. Cough in adult  - cefTRIAXone (ROCEPHIN) injection 1 g - azithromycin (ZITHROMAX) 250 MG tablet; Take as directed  Dispense: 6 tablet; Refill: 0 - benzonatate (TESSALON) 100 MG capsule; Take 1-2 capsules (100-200 mg total) by mouth 2 (two) times daily as needed.  Dispense: 40 capsule; Refill: 0  2. Lack of appetite   3. Type 2 diabetes mellitus with stage 3 chronic kidney disease, without long-term current use of insulin (HCC)  Try to  monitor glucose at home   4. Hypertension, benign   monitor for now   5. Dyslipidemia  Insurance is not covering crestor.  - atorvastatin (LIPITOR) 20 MG tablet; Take 1 tablet (20 mg total) by mouth daily.  Dispense: 90 tablet; Refill: 1

## 2018-05-27 ENCOUNTER — Telehealth: Payer: Self-pay | Admitting: Family Medicine

## 2018-05-27 ENCOUNTER — Other Ambulatory Visit: Payer: Self-pay | Admitting: Family Medicine

## 2018-05-27 DIAGNOSIS — R059 Cough, unspecified: Secondary | ICD-10-CM

## 2018-05-27 DIAGNOSIS — R05 Cough: Secondary | ICD-10-CM

## 2018-05-27 NOTE — Telephone Encounter (Signed)
Patient notified and will go have the CXR done today.

## 2018-05-27 NOTE — Telephone Encounter (Signed)
Copied from [place] [phone]. Topic: Quick Communication - See Telephone Encounter >> May 27, 2018 10:45 AM Robina Ade, Helene Kelp D wrote: CRM for notification. See Telephone encounter for: 05/27/18. Patient called and wants to let Dr. [name] that she is doing better by the tightness is still there. She wants to know if we would send a new rx for her. Please call patient back, thanks

## 2018-05-29 ENCOUNTER — Other Ambulatory Visit: Payer: Self-pay | Admitting: Family Medicine

## 2018-05-29 ENCOUNTER — Other Ambulatory Visit: Payer: Self-pay

## 2018-05-29 ENCOUNTER — Ambulatory Visit
Admission: RE | Admit: 2018-05-29 | Discharge: 2018-05-29 | Disposition: A | Discharge status: Home / Self Care | Payer: Medicare HMO | Attending: Family Medicine | Admitting: Family Medicine

## 2018-05-29 ENCOUNTER — Ambulatory Visit
Admission: RE | Admit: 2018-05-29 | Discharge: 2018-05-29 | Disposition: A | Discharge status: Home / Self Care | Payer: Medicare HMO | Source: Ambulatory Visit | Attending: Family Medicine | Admitting: Family Medicine

## 2018-05-29 DIAGNOSIS — R05 Cough: Secondary | ICD-10-CM | POA: Diagnosis present

## 2018-05-29 DIAGNOSIS — J181 Lobar pneumonia, unspecified organism: Principal | ICD-10-CM

## 2018-05-29 DIAGNOSIS — R059 Cough, unspecified: Secondary | ICD-10-CM

## 2018-05-29 DIAGNOSIS — J189 Pneumonia, unspecified organism: Secondary | ICD-10-CM

## 2018-05-29 MED ORDER — AMOXICILLIN-POT CLAVULANATE 875-125 MG PO TABS: 1.0000 | ORAL_TABLET | Freq: Two times a day (BID) | ORAL | 0 refills | Status: DC

## 2018-06-09 ENCOUNTER — Other Ambulatory Visit: Payer: Self-pay

## 2018-06-10 ENCOUNTER — Other Ambulatory Visit: Payer: Self-pay

## 2018-06-10 ENCOUNTER — Inpatient Hospital Stay: Payer: Medicare HMO | Attending: Oncology

## 2018-06-10 DIAGNOSIS — D500 Iron deficiency anemia secondary to blood loss (chronic): Secondary | ICD-10-CM

## 2018-06-10 DIAGNOSIS — N183 Chronic kidney disease, stage 3 (moderate): Secondary | ICD-10-CM | POA: Diagnosis present

## 2018-06-10 DIAGNOSIS — D631 Anemia in chronic kidney disease: Secondary | ICD-10-CM | POA: Insufficient documentation

## 2018-06-10 LAB — FERRITIN: Ferritin: 115 ng/mL (ref 11–307)

## 2018-06-10 LAB — CBC WITH DIFFERENTIAL/PLATELET
Abs Immature Granulocytes: 0 10*3/uL (ref 0.00–0.07)
Basophils Absolute: 0.1 10*3/uL (ref 0.0–0.1)
Basophils Relative: 2 %
Eosinophils Absolute: 0.1 10*3/uL (ref 0.0–0.5)
Eosinophils Relative: 4 %
HCT: 30.1 % — ABNORMAL LOW (ref 36.0–46.0)
Hemoglobin: 9.7 g/dL — ABNORMAL LOW (ref 12.0–15.0)
Immature Granulocytes: 0 %
Lymphocytes Relative: 46 %
Lymphs Abs: 1.6 10*3/uL (ref 0.7–4.0)
MCH: 29.2 pg (ref 26.0–34.0)
MCHC: 32.2 g/dL (ref 30.0–36.0)
MCV: 90.7 fL (ref 80.0–100.0)
Monocytes Absolute: 0.3 10*3/uL (ref 0.1–1.0)
Monocytes Relative: 8 %
Neutro Abs: 1.4 10*3/uL — ABNORMAL LOW (ref 1.7–7.7)
Neutrophils Relative %: 40 %
Platelets: 247 10*3/uL (ref 150–400)
RBC: 3.32 MIL/uL — ABNORMAL LOW (ref 3.87–5.11)
RDW: 13.2 % (ref 11.5–15.5)
WBC: 3.5 10*3/uL — ABNORMAL LOW (ref 4.0–10.5)
nRBC: 0 % (ref 0.0–0.2)

## 2018-06-10 LAB — IRON AND TIBC
Iron: 81 ug/dL (ref 28–170)
Saturation Ratios: 27 % (ref 10.4–31.8)
TIBC: 299 ug/dL (ref 250–450)
UIBC: 218 ug/dL

## 2018-06-11 ENCOUNTER — Other Ambulatory Visit: Payer: Self-pay

## 2018-06-11 ENCOUNTER — Encounter: Payer: Self-pay | Admitting: Oncology

## 2018-06-11 ENCOUNTER — Inpatient Hospital Stay (HOSPITAL_BASED_OUTPATIENT_CLINIC_OR_DEPARTMENT_OTHER): Payer: Medicare HMO | Admitting: Oncology

## 2018-06-11 DIAGNOSIS — D500 Iron deficiency anemia secondary to blood loss (chronic): Secondary | ICD-10-CM | POA: Diagnosis not present

## 2018-06-11 DIAGNOSIS — D631 Anemia in chronic kidney disease: Secondary | ICD-10-CM | POA: Diagnosis not present

## 2018-06-11 DIAGNOSIS — N183 Chronic kidney disease, stage 3 unspecified: Secondary | ICD-10-CM

## 2018-06-11 NOTE — Progress Notes (Signed)
Called patient for Tele-Visit.  Patient states no new concerns today.  

## 2018-06-12 ENCOUNTER — Inpatient Hospital Stay: Payer: Medicare HMO

## 2018-06-12 ENCOUNTER — Inpatient Hospital Stay: Payer: Medicare HMO | Admitting: Oncology

## 2018-06-12 ENCOUNTER — Other Ambulatory Visit: Payer: Self-pay

## 2018-06-12 VITALS — BP 123/71 | HR 60 | Temp 97.1°F | Resp 18

## 2018-06-12 DIAGNOSIS — D508 Other iron deficiency anemias: Secondary | ICD-10-CM

## 2018-06-12 DIAGNOSIS — N183 Chronic kidney disease, stage 3 (moderate): Secondary | ICD-10-CM | POA: Diagnosis not present

## 2018-06-12 MED ORDER — SODIUM CHLORIDE 0.9 % IV SOLN
Freq: Once | INTRAVENOUS | Status: AC
  Administered 2018-06-12: 14:00:00 via INTRAVENOUS
  Filled 2018-06-12: qty 250

## 2018-06-12 MED ORDER — SODIUM CHLORIDE 0.9 % IV SOLN: 200.0000 mg | INTRAVENOUS | Status: DC

## 2018-06-12 MED ORDER — IRON SUCROSE 20 MG/ML IV SOLN
200.0000 mg | Freq: Once | INTRAVENOUS | Status: AC
  Administered 2018-06-12: 200 mg via INTRAVENOUS
  Filled 2018-06-12: qty 10

## 2018-06-12 NOTE — Progress Notes (Signed)
HEMATOLOGY-ONCOLOGY TeleHEALTH VISIT PROGRESS NOTE  I connected with Emma Campbell on 06/12/18 at  1:15 PM EDT by telephone visit and verified that I am speaking with the correct person using two identifiers. I discussed the limitations, risks, security and privacy concerns of performing an evaluation and management service by telemedicine and the availability of in-person appointments. I also discussed with the patient that there may be a patient responsible charge related to this service. The patient expressed understanding and agreed to proceed.   Other persons participating in the visit and their role in the encounter:  Emma Campbell, CMA, check in patient       [name] is a 85 y.o. female who has above history reviewed by me today presents for follow up visit for management of anemia Problems and complaints are listed below:  Iron deficiency anemia, previously received IV iron oral ferrous sulfate supplementation twice daily. Reports feeling tired Fatigue: reports worsening fatigue. Chronic onset, perisistent, no aggravating or improving factors, no associated symptoms.  She recently recovered from an episode of pneumonia and finished a course of antibiotics. Cough has resolved.   Review of Systems  Constitutional: Positive for fatigue. Negative for appetite change, chills and fever.  HENT:   Negative for hearing loss and voice change.   Eyes: Negative for eye problems.  Respiratory: Negative for chest tightness and cough.   Cardiovascular: Negative for chest pain.  Gastrointestinal: Negative for abdominal distention, abdominal pain and blood in stool.  Endocrine: Negative for hot flashes.  Genitourinary: Negative for difficulty urinating and frequency.   Musculoskeletal: Negative for arthralgias.  Skin: Negative for itching and rash.  Neurological: Negative for extremity weakness.  Hematological: Negative for adenopathy.  Psychiatric/Behavioral: Negative  for confusion.    Past Medical History:  Diagnosis Date  . Anemia   . Chronic kidney disease, stage III (moderate) (HCC)   . Diverticulosis   . Elevated LFTs   . Essential hypertension, malignant   . Fibrocystic breast disease   . Heart murmur   . Hyperlipidemia   . Menopausal problem   . Osteoarthrosis   . Osteoporosis   . Peripheral autonomic neuropathy   . Sick sinus syndrome (HCC)    Dr. Alfredo Batty  . Type II diabetes mellitus with renal manifestations ([place])   . Vitamin D deficiency    Past Surgical History:  Procedure Laterality Date  . COLONOSCOPY WITH PROPOFOL N/A 11/06/2016   Procedure: COLONOSCOPY WITH PROPOFOL;  Surgeon: Lucilla Lame, MD;  Location: [place];  Service: Endoscopy;  Laterality: N/A;  DIABETIC  . ESOPHAGOGASTRODUODENOSCOPY (EGD) WITH PROPOFOL N/A 11/06/2016   Procedure: ESOPHAGOGASTRODUODENOSCOPY (EGD) WITH PROPOFOL;  Surgeon: Lucilla Lame, MD;  Location: [place];  Service: Endoscopy;  Laterality: N/A;  . ESOPHAGOGASTRODUODENOSCOPY (EGD) WITH PROPOFOL N/A 01/23/2017   Procedure: ESOPHAGOGASTRODUODENOSCOPY (EGD) WITH PROPOFOL with PUSH;  Surgeon: Lucilla Lame, MD;  Location: [facility] ENDOSCOPY;  Service: Endoscopy;  Laterality: N/A;  . GIVENS CAPSULE STUDY N/A 12/22/2016   Procedure: GIVENS CAPSULE STUDY;  Surgeon: Lucilla Lame, MD;  Location: [facility] ENDOSCOPY;  Service: Endoscopy;  Laterality: N/A;  . PACEMAKER INSERTION  2006  . POLYPECTOMY  11/06/2016   Procedure: POLYPECTOMY;  Surgeon: Lucilla Lame, MD;  Location: [place];  Service: Endoscopy;;    Family History  Problem Relation Age of Onset  . Hypertension Mother   . Stroke Mother   . Diabetes Daughter   . Stroke Father   . Healthy Sister   . Hypertension Maternal  Grandmother     Social History   Socioeconomic History  . Marital status: Widowed    Spouse name: Shanon Brow  . Number of children: 1  . Years of education: 2 years of college  . Highest education  level: 12th grade  Occupational History  . Occupation: Retired Agricultural engineer at [employer]  . Financial resource strain: Not hard at all  . Food insecurity:    Worry: Never true    Inability: Never true  . Transportation needs:    Medical: No    Non-medical: No  Tobacco Use  . Smoking status: Never Smoker  . Smokeless tobacco: Never Used  . Tobacco comment: smoking cessation materials not required  Substance and Sexual Activity  . Alcohol use: No    Alcohol/week: 0.0 standard drinks  . Drug use: No  . Sexual activity: Never  Lifestyle  . Physical activity:    Days per week: 7 days    Minutes per session: 30 min  . Stress: Not at all  Relationships  . Social connections:    Talks on phone: More than three times a week    Gets together: More than three times a week    Attends religious service: More than 4 times per year    Active member of club or organization: Yes    Attends meetings of clubs or organizations: Never    Relationship status: Widowed  . Intimate partner violence:    Fear of current or ex partner: No    Emotionally abused: No    Physically abused: No    Forced sexual activity: No  Other Topics Concern  . Not on file  Social History Narrative   She still has a house but her daughter and grand-daughter are staying at her house   She moved in with her older sister ( 49 years older), to help her out, and now they are watching her twin  great-grand-daughters ( they were born in 2019 ), watching them for about 4 hours a few days a week    Current Outpatient Medications on File Prior to Visit  Medication Sig Dispense Refill  . amiodarone (PACERONE) 200 MG tablet Take 100 mg by mouth daily.    Marland Kitchen amLODipine-valsartan (EXFORGE) 5-320 MG tablet Take 1 tablet by mouth daily. 90 tablet 1  . aspirin 81 MG tablet Take 81 mg by mouth daily.    Marland Kitchen atorvastatin (LIPITOR) 20 MG tablet Take 1 tablet (20 mg total) by mouth daily. 90 tablet 1  . cholecalciferol  (VITAMIN D) 1000 UNITS tablet Take 200 Units by mouth daily.    . ferrous sulfate 325 (65 FE) MG EC tablet Take 1 tablet (325 mg total) by mouth 2 (two) times daily with a meal. 119 tablet 1  . folic acid (FOLVITE) 417 MCG tablet Take 400 mcg by mouth daily.    . hydrochlorothiazide (HYDRODIURIL) 12.5 MG tablet Take 1 tablet (12.5 mg total) by mouth daily. 90 tablet 1  . metFORMIN (GLUCOPHAGE-XR) 750 MG 24 hr tablet Take 1 tablet (750 mg total) by mouth every evening. 90 tablet 1   Current Facility-Administered Medications on File Prior to Visit  Medication Dose Route Frequency Provider Last Rate Last Dose  . cyanocobalamin ((VITAMIN B-12)) injection 1,000 mcg  1,000 mcg Intramuscular Q30 days Steele Sizer, MD   1,000 mcg at 05/21/18 1457    Allergies  Allergen Reactions  . Ace Inhibitors Other (See Comments)    Pt unable to recall.  Was advised to avoid       Observations/Objective: There were no vitals filed for this visit. There is no height or weight on file to calculate BMI.   CBC    Component Value Date/Time   WBC 3.5 (L) 06/10/2018 1402   RBC 3.32 (L) 06/10/2018 1402   HGB 9.7 (L) 06/10/2018 1402   HCT 30.1 (L) 06/10/2018 1402   PLT 247 06/10/2018 1402   MCV 90.7 06/10/2018 1402   MCH 29.2 06/10/2018 1402   MCHC 32.2 06/10/2018 1402   RDW 13.2 06/10/2018 1402   LYMPHSABS 1.6 06/10/2018 1402   MONOABS 0.3 06/10/2018 1402   EOSABS 0.1 06/10/2018 1402   BASOSABS 0.1 06/10/2018 1402    CMP     Component Value Date/Time   NA 140 04/19/2018 1044   NA 140 11/30/2014 1121   K 4.2 04/19/2018 1044   CL 103 04/19/2018 1044   CO2 26 04/19/2018 1044   GLUCOSE 105 (H) 04/19/2018 1044   BUN 16 04/19/2018 1044   BUN 16 11/30/2014 1121   CREATININE 1.03 (H) 04/19/2018 1044   CALCIUM 9.3 04/19/2018 1044   PROT 6.9 04/19/2018 1044   PROT 6.8 11/30/2014 1121   ALBUMIN 3.9 07/30/2017 1100   ALBUMIN 4.3 11/30/2014 1121   AST 17 04/19/2018 1044   ALT 12 04/19/2018 1044    ALKPHOS 59 12/09/2015 1103   BILITOT 0.5 04/19/2018 1044   BILITOT 0.3 11/30/2014 1121   GFRNONAA 50 (L) 04/19/2018 1044   GFRAA 58 (L) 04/19/2018 1044     Assessment and Plan: 1. Iron deficiency anemia due to chronic blood loss   2. Anemia of chronic renal failure, stage 3 (moderate) ([place])     Labs reviewed and discussed with patient. Iron panel showed increased ferritin to 115, saturation ratio 27. CBC showed decreased hemoglobin 9.7, neutrophil 1.4. Discussed with patient that there may been a component of anemia of CKD I recommend proceed with IV Venofer weekly x2 further increase iron stores If hemoglobin persistently below 10, will proceed with erythropoietin replacement therapy. Rationale of erythropoietin replacement therapy and potential side effects discussed with patient. Patient is in agreement.  Follow Up Instructions: Follow-up in 4 weeks with labs, MD assessment +/-IV Venofer or Procrit   I discussed the assessment and treatment plan with the patient. The patient was provided an opportunity to ask questions and all were answered. The patient agreed with the plan and demonstrated an understanding of the instructions.  The patient was advised to call back or seek an in-person evaluation if the symptoms worsen or if the condition fails to improve as anticipated.   I provided 14 minutes of non face-to-face telephone visit time during this encounter, and > 50% was spent counseling as documented under my assessment & plan.  Earlie Server, MD 06/12/2018 7:44 AM

## 2018-06-17 ENCOUNTER — Other Ambulatory Visit: Payer: Self-pay | Admitting: Oncology

## 2018-06-18 ENCOUNTER — Ambulatory Visit: Payer: Self-pay

## 2018-06-19 ENCOUNTER — Other Ambulatory Visit: Payer: Self-pay

## 2018-06-19 ENCOUNTER — Inpatient Hospital Stay: Payer: Medicare HMO

## 2018-06-19 DIAGNOSIS — N183 Chronic kidney disease, stage 3 (moderate): Secondary | ICD-10-CM | POA: Diagnosis not present

## 2018-06-19 DIAGNOSIS — D508 Other iron deficiency anemias: Secondary | ICD-10-CM

## 2018-06-19 MED ORDER — IRON SUCROSE 20 MG/ML IV SOLN
200.0000 mg | Freq: Once | INTRAVENOUS | Status: AC
  Administered 2018-06-19: 200 mg via INTRAVENOUS
  Filled 2018-06-19: qty 10

## 2018-06-19 MED ORDER — SODIUM CHLORIDE 0.9 % IV SOLN
INTRAVENOUS | Status: DC
  Administered 2018-06-19: 14:00:00 via INTRAVENOUS
  Filled 2018-06-19 (×2): qty 250

## 2018-06-25 ENCOUNTER — Other Ambulatory Visit: Payer: Self-pay

## 2018-06-26 ENCOUNTER — Encounter (INDEPENDENT_AMBULATORY_CARE_PROVIDER_SITE_OTHER): Payer: Self-pay

## 2018-06-26 ENCOUNTER — Inpatient Hospital Stay: Payer: Medicare HMO

## 2018-06-26 ENCOUNTER — Other Ambulatory Visit: Payer: Self-pay

## 2018-06-26 VITALS — BP 130/60 | HR 60 | Temp 96.5°F | Resp 18

## 2018-06-26 DIAGNOSIS — N183 Chronic kidney disease, stage 3 (moderate): Secondary | ICD-10-CM | POA: Diagnosis not present

## 2018-06-26 DIAGNOSIS — D508 Other iron deficiency anemias: Secondary | ICD-10-CM

## 2018-06-26 MED ORDER — SODIUM CHLORIDE 0.9 % IV SOLN
INTRAVENOUS | Status: DC
  Administered 2018-06-26: 14:00:00 via INTRAVENOUS
  Filled 2018-06-26: qty 250

## 2018-06-26 MED ORDER — IRON SUCROSE 20 MG/ML IV SOLN
200.0000 mg | Freq: Once | INTRAVENOUS | Status: AC
  Administered 2018-06-26: 14:00:00 200 mg via INTRAVENOUS
  Filled 2018-06-26: qty 10

## 2018-07-07 ENCOUNTER — Other Ambulatory Visit: Payer: Self-pay

## 2018-07-08 ENCOUNTER — Other Ambulatory Visit: Payer: Self-pay

## 2018-07-08 ENCOUNTER — Ambulatory Visit (INDEPENDENT_AMBULATORY_CARE_PROVIDER_SITE_OTHER): Payer: Medicare HMO

## 2018-07-08 ENCOUNTER — Inpatient Hospital Stay: Payer: Medicare HMO | Attending: Oncology

## 2018-07-08 DIAGNOSIS — D500 Iron deficiency anemia secondary to blood loss (chronic): Secondary | ICD-10-CM

## 2018-07-08 DIAGNOSIS — E538 Deficiency of other specified B group vitamins: Secondary | ICD-10-CM | POA: Diagnosis not present

## 2018-07-08 DIAGNOSIS — D631 Anemia in chronic kidney disease: Secondary | ICD-10-CM

## 2018-07-08 DIAGNOSIS — N183 Anemia in chronic kidney disease: Secondary | ICD-10-CM

## 2018-07-08 LAB — CBC WITH DIFFERENTIAL/PLATELET
Abs Immature Granulocytes: 0.01 10*3/uL (ref 0.00–0.07)
Basophils Absolute: 0 10*3/uL (ref 0.0–0.1)
Basophils Relative: 1 %
Eosinophils Absolute: 0.1 10*3/uL (ref 0.0–0.5)
Eosinophils Relative: 4 %
HCT: 32.1 % — ABNORMAL LOW (ref 36.0–46.0)
Hemoglobin: 10.2 g/dL — ABNORMAL LOW (ref 12.0–15.0)
Immature Granulocytes: 0 %
Lymphocytes Relative: 42 %
Lymphs Abs: 1.3 10*3/uL (ref 0.7–4.0)
MCH: 29.1 pg (ref 26.0–34.0)
MCHC: 31.8 g/dL (ref 30.0–36.0)
MCV: 91.5 fL (ref 80.0–100.0)
Monocytes Absolute: 0.2 10*3/uL (ref 0.1–1.0)
Monocytes Relative: 6 %
Neutro Abs: 1.5 10*3/uL — ABNORMAL LOW (ref 1.7–7.7)
Neutrophils Relative %: 47 %
Platelets: 206 10*3/uL (ref 150–400)
RBC: 3.51 MIL/uL — ABNORMAL LOW (ref 3.87–5.11)
RDW: 13.6 % (ref 11.5–15.5)
WBC: 3.1 10*3/uL — ABNORMAL LOW (ref 4.0–10.5)
nRBC: 0 % (ref 0.0–0.2)

## 2018-07-08 LAB — RETIC PANEL
Immature Retic Fract: 2.6 % (ref 2.3–15.9)
RBC.: 3.51 MIL/uL — ABNORMAL LOW (ref 3.87–5.11)
Retic Count, Absolute: 44.6 10*3/uL (ref 19.0–186.0)
Retic Ct Pct: 1.3 % (ref 0.4–3.1)
Reticulocyte Hemoglobin: 34 pg (ref >27.9)

## 2018-07-08 LAB — FERRITIN: Ferritin: 297 ng/mL (ref 11–307)

## 2018-07-08 LAB — IRON AND TIBC
Iron: 86 ug/dL (ref 28–170)
Saturation Ratios: 32 % — ABNORMAL HIGH (ref 10.4–31.8)
TIBC: 271 ug/dL (ref 250–450)
UIBC: 185 ug/dL

## 2018-07-08 LAB — FOLATE: Folate: 40 ng/mL (ref >5.9)

## 2018-07-08 LAB — VITAMIN B12: Vitamin B-12: >7500 pg/mL — ABNORMAL HIGH (ref 180–914)

## 2018-07-08 LAB — TECHNOLOGIST SMEAR REVIEW

## 2018-07-09 ENCOUNTER — Other Ambulatory Visit: Payer: Self-pay

## 2018-07-09 ENCOUNTER — Inpatient Hospital Stay: Payer: Medicare HMO

## 2018-07-09 ENCOUNTER — Inpatient Hospital Stay (HOSPITAL_BASED_OUTPATIENT_CLINIC_OR_DEPARTMENT_OTHER): Payer: Medicare HMO | Admitting: Oncology

## 2018-07-09 ENCOUNTER — Encounter: Payer: Self-pay | Admitting: Oncology

## 2018-07-09 DIAGNOSIS — N183 Chronic kidney disease, stage 3 (moderate): Secondary | ICD-10-CM | POA: Diagnosis not present

## 2018-07-09 DIAGNOSIS — D500 Iron deficiency anemia secondary to blood loss (chronic): Secondary | ICD-10-CM

## 2018-07-09 DIAGNOSIS — Z79899 Other long term (current) drug therapy: Secondary | ICD-10-CM | POA: Diagnosis not present

## 2018-07-09 DIAGNOSIS — Z7982 Long term (current) use of aspirin: Secondary | ICD-10-CM | POA: Diagnosis not present

## 2018-07-09 DIAGNOSIS — D631 Anemia in chronic kidney disease: Secondary | ICD-10-CM | POA: Diagnosis not present

## 2018-07-09 NOTE — Progress Notes (Signed)
HEMATOLOGY-ONCOLOGY TeleHEALTH VISIT PROGRESS NOTE  I connected with Emma Campbell on 07/09/18 at  8:45 AM EDT by telephone visit and verified that I am speaking with the correct person using two identifiers. I discussed the limitations, risks, security and privacy concerns of performing an evaluation and management service by telemedicine and the availability of in-person appointments. I also discussed with the patient that there may be a patient responsible charge related to this service. The patient expressed understanding and agreed to proceed.   Other persons participating in the visit and their role in the encounter:  Geraldine Solar, CMA, check in patient     Patient's location: Home  Provider's location: Home office Chief Complaint: Follow-up for management of anemia secondary to chronic kidney disease and iron deficiency   INTERVAL HISTORY Emma Campbell is a 85 y.o. female who has above history reviewed by me today presents for follow up visit for management of anemia secondary to chronic kidney disease and iron deficiency. Problems and complaints are listed below:  Patient was last seen by me in April 2020, patient was recommended to proceed with additional IV Venofer further increase iron store.  She reports tolerating IV iron treatment well.  Reports fatigue level has improved. Denies any shortness of breath with exertion, fever, chills, nausea, vomiting, diarrhea, chest pain, abdominal pain.  Feeling well at baseline today.  Review of Systems  Constitutional: Positive for fatigue. Negative for appetite change, chills and fever.  HENT:   Negative for hearing loss and voice change.   Eyes: Negative for eye problems.  Respiratory: Negative for chest tightness and cough.   Cardiovascular: Negative for chest pain.  Gastrointestinal: Negative for abdominal distention, abdominal pain and blood in stool.  Endocrine: Negative for hot flashes.  Genitourinary: Negative for difficulty  urinating and frequency.   Musculoskeletal: Negative for arthralgias.  Skin: Negative for itching and rash.  Neurological: Negative for extremity weakness.  Hematological: Negative for adenopathy.  Psychiatric/Behavioral: Negative for confusion.    Past Medical History:  Diagnosis Date  . Anemia   . Chronic kidney disease, stage III (moderate) (HCC)   . Diverticulosis   . Elevated LFTs   . Essential hypertension, malignant   . Fibrocystic breast disease   . Heart murmur   . Hyperlipidemia   . Menopausal problem   . Osteoarthrosis   . Osteoporosis   . Peripheral autonomic neuropathy   . Sick sinus syndrome (HCC)    Dr. Alfredo Batty  . Type II diabetes mellitus with renal manifestations ([place])   . Vitamin D deficiency    Past Surgical History:  Procedure Laterality Date  . COLONOSCOPY WITH PROPOFOL N/A 11/06/2016   Procedure: COLONOSCOPY WITH PROPOFOL;  Surgeon: Lucilla Lame, MD;  Location: [place];  Service: Endoscopy;  Laterality: N/A;  DIABETIC  . ESOPHAGOGASTRODUODENOSCOPY (EGD) WITH PROPOFOL N/A 11/06/2016   Procedure: ESOPHAGOGASTRODUODENOSCOPY (EGD) WITH PROPOFOL;  Surgeon: Lucilla Lame, MD;  Location: [place];  Service: Endoscopy;  Laterality: N/A;  . ESOPHAGOGASTRODUODENOSCOPY (EGD) WITH PROPOFOL N/A 01/23/2017   Procedure: ESOPHAGOGASTRODUODENOSCOPY (EGD) WITH PROPOFOL with PUSH;  Surgeon: Lucilla Lame, MD;  Location: [facility] ENDOSCOPY;  Service: Endoscopy;  Laterality: N/A;  . GIVENS CAPSULE STUDY N/A 12/22/2016   Procedure: GIVENS CAPSULE STUDY;  Surgeon: Lucilla Lame, MD;  Location: [facility] ENDOSCOPY;  Service: Endoscopy;  Laterality: N/A;  . PACEMAKER INSERTION  2006  . POLYPECTOMY  11/06/2016   Procedure: POLYPECTOMY;  Surgeon: Lucilla Lame, MD;  Location: [place];  Service: Endoscopy;;  Family History  Problem Relation Age of Onset  . Hypertension Mother   . Stroke Mother   . Diabetes Daughter   . Stroke Father   . Healthy  Sister   . Hypertension Maternal Grandmother     Social History   Socioeconomic History  . Marital status: Widowed    Spouse name: Shanon Brow  . Number of children: 1  . Years of education: 2 years of college  . Highest education level: 12th grade  Occupational History  . Occupation: Retired Agricultural engineer at [employer]  . Financial resource strain: Not hard at all  . Food insecurity:    Worry: Never true    Inability: Never true  . Transportation needs:    Medical: No    Non-medical: No  Tobacco Use  . Smoking status: Never Smoker  . Smokeless tobacco: Never Used  . Tobacco comment: smoking cessation materials not required  Substance and Sexual Activity  . Alcohol use: No    Alcohol/week: 0.0 standard drinks  . Drug use: No  . Sexual activity: Never  Lifestyle  . Physical activity:    Days per week: 7 days    Minutes per session: 30 min  . Stress: Not at all  Relationships  . Social connections:    Talks on phone: More than three times a week    Gets together: More than three times a week    Attends religious service: More than 4 times per year    Active member of club or organization: Yes    Attends meetings of clubs or organizations: Never    Relationship status: Widowed  . Intimate partner violence:    Fear of current or ex partner: No    Emotionally abused: No    Physically abused: No    Forced sexual activity: No  Other Topics Concern  . Not on file  Social History Narrative   She still has a house but her daughter and grand-daughter are staying at her house   She moved in with her older sister ( 68 years older), to help her out, and now they are watching her twin  great-grand-daughters ( they were born in 2019 ), watching them for about 4 hours a few days a week    Current Outpatient Medications on File Prior to Visit  Medication Sig Dispense Refill  . amiodarone (PACERONE) 200 MG tablet Take 100 mg by mouth daily.    Marland Kitchen amLODipine-valsartan  (EXFORGE) 5-320 MG tablet Take 1 tablet by mouth daily. 90 tablet 1  . aspirin 81 MG tablet Take 81 mg by mouth daily.    Marland Kitchen atorvastatin (LIPITOR) 20 MG tablet Take 1 tablet (20 mg total) by mouth daily. 90 tablet 1  . cholecalciferol (VITAMIN D) 1000 UNITS tablet Take 200 Units by mouth daily.    . ferrous sulfate 325 (65 FE) MG EC tablet Take 1 tablet (325 mg total) by mouth 2 (two) times daily with a meal. 161 tablet 1  . folic acid (FOLVITE) 096 MCG tablet Take 400 mcg by mouth daily.    . hydrochlorothiazide (HYDRODIURIL) 12.5 MG tablet Take 1 tablet (12.5 mg total) by mouth daily. 90 tablet 1  . metFORMIN (GLUCOPHAGE-XR) 750 MG 24 hr tablet Take 1 tablet (750 mg total) by mouth every evening. 90 tablet 1   Current Facility-Administered Medications on File Prior to Visit  Medication Dose Route Frequency Provider Last Rate Last Dose  . 0.9 %  sodium chloride infusion  Intravenous Continuous Earlie Server, MD 10 mL/hr at 06/19/18 1339    . cyanocobalamin ((VITAMIN B-12)) injection 1,000 mcg  1,000 mcg Intramuscular Q30 days Steele Sizer, MD   1,000 mcg at 07/08/18 0949    Allergies  Allergen Reactions  . Ace Inhibitors Other (See Comments)    Pt unable to recall. Was advised to avoid       Observations/Objective: There were no vitals filed for this visit. There is no height or weight on file to calculate BMI.  Pain level 0 Physical Exam  CBC    Component Value Date/Time   WBC 3.1 (L) 07/08/2018 1115   RBC 3.51 (L) 07/08/2018 1115   RBC 3.51 (L) 07/08/2018 1115   HGB 10.2 (L) 07/08/2018 1115   HCT 32.1 (L) 07/08/2018 1115   PLT 206 07/08/2018 1115   MCV 91.5 07/08/2018 1115   MCH 29.1 07/08/2018 1115   MCHC 31.8 07/08/2018 1115   RDW 13.6 07/08/2018 1115   LYMPHSABS 1.3 07/08/2018 1115   MONOABS 0.2 07/08/2018 1115   EOSABS 0.1 07/08/2018 1115   BASOSABS 0.0 07/08/2018 1115    CMP     Component Value Date/Time   NA 140 04/19/2018 1044   NA 140 11/30/2014 1121   K 4.2  04/19/2018 1044   CL 103 04/19/2018 1044   CO2 26 04/19/2018 1044   GLUCOSE 105 (H) 04/19/2018 1044   BUN 16 04/19/2018 1044   BUN 16 11/30/2014 1121   CREATININE 1.03 (H) 04/19/2018 1044   CALCIUM 9.3 04/19/2018 1044   PROT 6.9 04/19/2018 1044   PROT 6.8 11/30/2014 1121   ALBUMIN 3.9 07/30/2017 1100   ALBUMIN 4.3 11/30/2014 1121   AST 17 04/19/2018 1044   ALT 12 04/19/2018 1044   ALKPHOS 59 12/09/2015 1103   BILITOT 0.5 04/19/2018 1044   BILITOT 0.3 11/30/2014 1121   GFRNONAA 50 (L) 04/19/2018 1044   GFRAA 58 (L) 04/19/2018 1044     Assessment and Plan: 1. Iron deficiency anemia due to chronic blood loss   2. Anemia of chronic renal failure, stage 3 (moderate) (HCC)     Labs are reviewed and discussed with patient.  Iron store has further improved after IV Venofer treatment. Hemoglobin has improved to 10.2. Iron panel indicated adequate iron stores. Hold additional IV Venofer at this point. Anemia secondary to chronic kidney disease, hemoglobin above 10, hold erythropoietin therapy. Recommend repeat labs and evaluation for need of Retacrit in approximately 2 months.  Follow Up Instructions: Lab MD +/-Retacrit.  I discussed the assessment and treatment plan with the patient. The patient was provided an opportunity to ask questions and all were answered. The patient agreed with the plan and demonstrated an understanding of the instructions.  The patient was advised to call back or seek an in-person evaluation if the symptoms worsen or if the condition fails to improve as anticipated.   I provided 12 minutes of non face-to-face telephone visit time during this encounter, and > 50% was spent counseling as documented under my assessment & plan.  Earlie Server, MD 07/09/2018 9:20 AM

## 2018-07-09 NOTE — Progress Notes (Signed)
Called patient for Telehealth visit via telephone.  Patient states no new concerns today.

## 2018-08-22 ENCOUNTER — Other Ambulatory Visit: Payer: Self-pay | Admitting: Family Medicine

## 2018-08-22 DIAGNOSIS — I10 Essential (primary) hypertension: Secondary | ICD-10-CM

## 2018-08-26 ENCOUNTER — Ambulatory Visit (INDEPENDENT_AMBULATORY_CARE_PROVIDER_SITE_OTHER): Payer: Medicare HMO | Admitting: Family Medicine

## 2018-08-26 ENCOUNTER — Other Ambulatory Visit: Payer: Self-pay

## 2018-08-26 ENCOUNTER — Encounter: Payer: Self-pay | Admitting: Family Medicine

## 2018-08-26 VITALS — BP 116/59

## 2018-08-26 DIAGNOSIS — E559 Vitamin D deficiency, unspecified: Secondary | ICD-10-CM | POA: Diagnosis not present

## 2018-08-26 DIAGNOSIS — I495 Sick sinus syndrome: Secondary | ICD-10-CM

## 2018-08-26 DIAGNOSIS — N183 Chronic kidney disease, stage 3 unspecified: Secondary | ICD-10-CM

## 2018-08-26 DIAGNOSIS — E7800 Pure hypercholesterolemia, unspecified: Secondary | ICD-10-CM

## 2018-08-26 DIAGNOSIS — I480 Paroxysmal atrial fibrillation: Secondary | ICD-10-CM | POA: Diagnosis not present

## 2018-08-26 DIAGNOSIS — I129 Hypertensive chronic kidney disease with stage 1 through stage 4 chronic kidney disease, or unspecified chronic kidney disease: Secondary | ICD-10-CM

## 2018-08-26 DIAGNOSIS — E1122 Type 2 diabetes mellitus with diabetic chronic kidney disease: Secondary | ICD-10-CM | POA: Diagnosis not present

## 2018-08-26 DIAGNOSIS — I10 Essential (primary) hypertension: Secondary | ICD-10-CM

## 2018-08-26 DIAGNOSIS — Z79899 Other long term (current) drug therapy: Secondary | ICD-10-CM | POA: Diagnosis not present

## 2018-08-26 NOTE — Progress Notes (Signed)
Name: Emma Campbell   MRN: [id number]    DOB: [birth year]   Date:08/26/2018       Progress Note  Subjective  Chief Complaint  Chief Complaint  Patient presents with  . Medication Refill  . Diabetes    Checks BS every day or so BS Averaging-130  . Hypertension    Denies any symptoms  . Hyperlipidemia  . Osteopenia  . Anemia    I connected with  Emma Campbell on 08/26/18 at 10:00 AM EDT by telephone and verified that I am speaking with the correct person using two identifiers.  I discussed the limitations, risks, security and privacy concerns of performing an evaluation and management service by telephone and the availability of in person appointments. Staff also discussed with the patient that there may be a patient responsible charge related to this service. Patient Location: at home Provider Location: [facility] Additional Individuals present: none   HPI   DMII with renal manifestation: CKI and microalbuminuria. HgbA1C has beenstableand it was 6.4% 03/2018 and she will return next week for A1C check.She denies hypoglycemia episodes. She feels well. Glucose at home is getting checked a couple of times weekly , values between 120's- highest 129   She denies polyphagia, polydipsia or polyuria ( except right after she takes HCTZ) .Continue ARB for kidney protection. Eye exam is up to date  HTN: taking medicationdaily and bp is at goal, no dizziness, chest pain or palpitation.BP towards low end of normal but it was 139/74 during recent visit to hematologist, we  decreased Hctz to 12.5 mg Feb 2020 she denies orthostatic changes, however at home today bp was very low, we will ask her to return next week for BP check and we may stop hctz if needed   Hyperlipidemia: she was not taking taking Crestor5 mg on her last visit and LDL went up to 175, however once she resumed medication cholesterol and last level was at goal. No myalgias   Sick Sinus Syndrome: she had  a pacemaker placed in 2006 and has been on Amiodarone since 2006, she is now down to 100 mg of Amiodarone daily, last TSH was back to normal.No chest pain or palpitation.Sees Dr. Clayborn Bigness, last visit 01/2018, she is [name] score of 2, and is on aspirin onlyper his recommendation.She is at a high risk of falls because of age and does not want anti-coagulant. She also has a history of Afib.Unchanged, she has a pacemaker check on July 8th    Vitamin D deficiency: still taking supplementation, denies fatigue, last Vitamin D was at goal. Unchanged  Osteopenia: used to take Fosamax but has been off and last bone density reviewed with patient, low FRAX score, she is back on Fosamax and is walking again about one mile per day  Anemia: iron deficiency and positive hemoccult, seen by Dr. Durwin Reges andhadEGD and colonoscopy September 2018,diagnosed with AVM small bowel and had it cauterized,hemoglobin dropped and was referred to Dr. Tasia Catchings, labs are being monitored by her, last HCT was in the 10's. She is done with infusions and has follow up appointment   B12 deficiency: last level was over 7000, Dr. Tasia Catchings advised her stop supplementation  Patient Active Problem List   Diagnosis Date Noted  . B12 deficiency 05/21/2017  . Gastrointestinal tract imaging abnormality   . Iron deficiency anemia   . Polyp of sigmoid colon   . Type 2 diabetes mellitus with stage 2 chronic kidney disease, without long-term current use of  insulin ([place]) 08/03/2015  . Arthritis, degenerative 05/03/2015  . Menopausal and perimenopausal disorder 05/03/2015  . Chronic kidney disease (CKD), stage III (moderate) ([place]) 11/30/2014  . High risk medication use 11/30/2014  . Diverticulosis 11/30/2014  . Fibrocystic breast disease 11/30/2014  . Elevated LFTs 11/30/2014  . Systolic ejection murmur 16/11/9602  . Paroxysmal A-fib ([place]) 11/30/2014  . Microalbuminuria 07/31/2014  . Elevated TSH 07/31/2014  . History of cardiac pacemaker  07/31/2014  . Sick sinus syndrome ([place]) 08/13/2013  . Hypercholesteremia 08/13/2013  . Hypertension, benign 08/13/2013  . Diabetes mellitus with renal manifestations, controlled ([place]) 08/13/2013  . Avitaminosis D 04/12/2009    Past Surgical History:  Procedure Laterality Date  . COLONOSCOPY WITH PROPOFOL N/A 11/06/2016   Procedure: COLONOSCOPY WITH PROPOFOL;  Surgeon: Lucilla Lame, MD;  Location: [place];  Service: Endoscopy;  Laterality: N/A;  DIABETIC  . ESOPHAGOGASTRODUODENOSCOPY (EGD) WITH PROPOFOL N/A 11/06/2016   Procedure: ESOPHAGOGASTRODUODENOSCOPY (EGD) WITH PROPOFOL;  Surgeon: Lucilla Lame, MD;  Location: [place];  Service: Endoscopy;  Laterality: N/A;  . ESOPHAGOGASTRODUODENOSCOPY (EGD) WITH PROPOFOL N/A 01/23/2017   Procedure: ESOPHAGOGASTRODUODENOSCOPY (EGD) WITH PROPOFOL with PUSH;  Surgeon: Lucilla Lame, MD;  Location: [facility] ENDOSCOPY;  Service: Endoscopy;  Laterality: N/A;  . GIVENS CAPSULE STUDY N/A 12/22/2016   Procedure: GIVENS CAPSULE STUDY;  Surgeon: Lucilla Lame, MD;  Location: [facility] ENDOSCOPY;  Service: Endoscopy;  Laterality: N/A;  . PACEMAKER INSERTION  2006  . POLYPECTOMY  11/06/2016   Procedure: POLYPECTOMY;  Surgeon: Lucilla Lame, MD;  Location: [place];  Service: Endoscopy;;    Family History  Problem Relation Age of Onset  . Hypertension Mother   . Stroke Mother   . Diabetes Daughter   . Stroke Father   . Healthy Sister   . Hypertension Maternal Grandmother     Social History   Socioeconomic History  . Marital status: Widowed    Spouse name: Emma Campbell  . Number of children: 1  . Years of education: 2 years of college  . Highest education level: 12th grade  Occupational History  . Occupation: Retired Agricultural engineer at [employer]  . Financial resource strain: Not hard at all  . Food insecurity    Worry: Never true    Inability: Never true  . Transportation needs    Medical: No    Non-medical: No   Tobacco Use  . Smoking status: Never Smoker  . Smokeless tobacco: Never Used  . Tobacco comment: smoking cessation materials not required  Substance and Sexual Activity  . Alcohol use: No    Alcohol/week: 0.0 standard drinks  . Drug use: No  . Sexual activity: Never  Lifestyle  . Physical activity    Days per week: 7 days    Minutes per session: 30 min  . Stress: Not at all  Relationships  . Social connections    Talks on phone: More than three times a week    Gets together: More than three times a week    Attends religious service: More than 4 times per year    Active member of club or organization: Yes    Attends meetings of clubs or organizations: Never    Relationship status: Widowed  . Intimate partner violence    Fear of current or ex partner: No    Emotionally abused: No    Physically abused: No    Forced sexual activity: No  Other Topics Concern  . Not on file  Social History Narrative  She still has a house but her daughter and grand-daughter are staying at her house   She moved in with her older sister ( 14 years older), to help her out, and now they are watching her twin  great-grand-daughters ( they were born in 2019 ), watching them for about 4 hours a few days a week     Current Outpatient Medications:  .  amiodarone (PACERONE) 200 MG tablet, Take 100 mg by mouth daily., Disp: , Rfl:  .  amLODipine-valsartan (EXFORGE) 5-320 MG tablet, Take 1 tablet by mouth daily., Disp: 90 tablet, Rfl: 1 .  aspirin 81 MG tablet, Take 81 mg by mouth daily., Disp: , Rfl:  .  atorvastatin (LIPITOR) 20 MG tablet, Take 1 tablet (20 mg total) by mouth daily., Disp: 90 tablet, Rfl: 1 .  cholecalciferol (VITAMIN D) 1000 UNITS tablet, Take 200 Units by mouth daily., Disp: , Rfl:  .  ferrous sulfate 325 (65 FE) MG EC tablet, Take 1 tablet (325 mg total) by mouth 2 (two) times daily with a meal., Disp: 180 tablet, Rfl: 1 .  folic acid (FOLVITE) 662 MCG tablet, Take 400 mcg by mouth  daily., Disp: , Rfl:  .  hydrochlorothiazide (HYDRODIURIL) 12.5 MG tablet, TAKE 1 TABLET EVERY DAY, Disp: 90 tablet, Rfl: 1 .  metFORMIN (GLUCOPHAGE-XR) 750 MG 24 hr tablet, Take 1 tablet (750 mg total) by mouth every evening., Disp: 90 tablet, Rfl: 1  Current Facility-Administered Medications:  .  cyanocobalamin ((VITAMIN B-12)) injection 1,000 mcg, 1,000 mcg, Intramuscular, Q30 days, Steele Sizer, MD, 1,000 mcg at 07/08/18 9476  Facility-Administered Medications Ordered in Other Visits:  .  0.9 %  sodium chloride infusion, , Intravenous, Continuous, Earlie Server, MD, Last Rate: 10 mL/hr at 06/19/18 1339  Allergies  Allergen Reactions  . Ace Inhibitors Other (See Comments)    Pt unable to recall. Was advised to avoid    I personally reviewed active problem list, medication list, allergies, family history, social history with the patient/caregiver today.   ROS  Ten systems reviewed and is negative except as mentioned in HPI   Objective  Vitals:   08/26/18 1031  BP: (!) 116/59   There is no height or weight on file to calculate BMI.  Physical Exam  Awake, alert and oriented   PHQ2/9: Depression screen [facility] 2/9 08/26/2018 04/19/2018 12/17/2017 08/15/2017 05/18/2017  Decreased Interest 0 0 0 0 0  Down, Depressed, Hopeless 0 0 0 0 0  PHQ - 2 Score 0 0 0 0 0  Altered sleeping 0 0 - - 0  Tired, decreased energy 0 0 - - 0  Change in appetite 0 0 - - 0  Feeling bad or failure about yourself  0 0 - - 0  Trouble concentrating 0 0 - - 0  Moving slowly or fidgety/restless 0 0 - - 0  Suicidal thoughts 0 0 - - 0  PHQ-9 Score 0 0 - - 0  Difficult doing work/chores Not difficult at all Not difficult at all - - Not difficult at all  Some recent data might be hidden   PHQ-2/9 Result is negative.    Fall Risk: Fall Risk  08/26/2018 04/19/2018 04/19/2018 12/17/2017 08/15/2017  Falls in the past year? 0 0 0 No No  Number falls in past yr: 0 0 - - -  Injury with Fall? 0 0 - - -  Comment - -  - - -  Risk for fall due to : - - - - -  Risk for fall due to: Comment - - - - -  Follow up - - - - -     Assessment & Plan  1. Paroxysmal A-fib ([place])  Under the care of cardiologist   2. Avitaminosis D  On supplements  3. Chronic kidney disease (CKD), stage III (moderate) (HCC)  Stable   4. High risk medication use   5. Hypertension, benign  bp towards low end of normal, she will return to see CMA next week and if bp below 120/80 we will stop hctz  6. Hypercholesteremia  Continue medication   7. Sick sinus syndrome [facility])  Sees cardiologist   8. Controlled type 2 diabetes mellitus with stage 3 chronic kidney disease, without long-term current use of insulin ([place])  Return next week for A1C   I discussed the assessment and treatment plan with the patient. The patient was provided an opportunity to ask questions and all were answered. The patient agreed with the plan and demonstrated an understanding of the instructions.   The patient was advised to call back or seek an in-person evaluation if the symptoms worsen or if the condition fails to improve as anticipated.  I provided 25 minutes of non-face-to-face time during this encounter.  Loistine Chance, MD

## 2018-08-30 ENCOUNTER — Other Ambulatory Visit: Payer: Self-pay | Admitting: Family Medicine

## 2018-08-30 DIAGNOSIS — N182 Chronic kidney disease, stage 2 (mild): Secondary | ICD-10-CM

## 2018-08-30 DIAGNOSIS — E1122 Type 2 diabetes mellitus with diabetic chronic kidney disease: Secondary | ICD-10-CM

## 2018-08-30 DIAGNOSIS — I10 Essential (primary) hypertension: Secondary | ICD-10-CM

## 2018-09-02 ENCOUNTER — Ambulatory Visit (INDEPENDENT_AMBULATORY_CARE_PROVIDER_SITE_OTHER): Payer: Medicare HMO

## 2018-09-02 ENCOUNTER — Other Ambulatory Visit: Payer: Self-pay | Admitting: Family Medicine

## 2018-09-02 VITALS — BP 104/50 | HR 65 | Temp 99.0°F

## 2018-09-02 DIAGNOSIS — Z0130 Encounter for examination of blood pressure without abnormal findings: Secondary | ICD-10-CM

## 2018-09-02 NOTE — Progress Notes (Addendum)
Patient is here for a blood pressure check. Patient denies chest pain, palpitations, shortness of breath or visual disturbances. At previous visit blood pressure was 116/59. Today during nurse visit first check blood pressure was 104/50 with a heart rate of 65. She does take blood pressure medications and has not missed any doses.   Please add patient to stop taking hctz and return in one week for bp recheck.  Thank you

## 2018-09-03 NOTE — Progress Notes (Signed)
Patient called.  Patient aware.  

## 2018-09-04 DIAGNOSIS — I495 Sick sinus syndrome: Secondary | ICD-10-CM | POA: Diagnosis not present

## 2018-09-10 ENCOUNTER — Inpatient Hospital Stay: Payer: Medicare HMO | Attending: Oncology

## 2018-09-10 ENCOUNTER — Other Ambulatory Visit: Payer: Self-pay

## 2018-09-10 ENCOUNTER — Inpatient Hospital Stay (HOSPITAL_BASED_OUTPATIENT_CLINIC_OR_DEPARTMENT_OTHER): Payer: Medicare HMO | Admitting: Oncology

## 2018-09-10 ENCOUNTER — Inpatient Hospital Stay: Payer: Medicare HMO

## 2018-09-10 ENCOUNTER — Encounter: Payer: Self-pay | Admitting: Oncology

## 2018-09-10 ENCOUNTER — Ambulatory Visit (INDEPENDENT_AMBULATORY_CARE_PROVIDER_SITE_OTHER): Payer: Medicare HMO

## 2018-09-10 VITALS — BP 141/70 | HR 78 | Temp 99.7°F | Resp 18 | Wt 114.7 lb

## 2018-09-10 VITALS — BP 133/64 | HR 67 | Ht 61.0 in | Wt 118.0 lb

## 2018-09-10 DIAGNOSIS — Z8249 Family history of ischemic heart disease and other diseases of the circulatory system: Secondary | ICD-10-CM | POA: Insufficient documentation

## 2018-09-10 DIAGNOSIS — N183 Chronic kidney disease, stage 3 (moderate): Secondary | ICD-10-CM | POA: Insufficient documentation

## 2018-09-10 DIAGNOSIS — Z0000 Encounter for general adult medical examination without abnormal findings: Secondary | ICD-10-CM

## 2018-09-10 DIAGNOSIS — E1122 Type 2 diabetes mellitus with diabetic chronic kidney disease: Secondary | ICD-10-CM

## 2018-09-10 DIAGNOSIS — E559 Vitamin D deficiency, unspecified: Secondary | ICD-10-CM | POA: Diagnosis not present

## 2018-09-10 DIAGNOSIS — Z7982 Long term (current) use of aspirin: Secondary | ICD-10-CM

## 2018-09-10 DIAGNOSIS — I129 Hypertensive chronic kidney disease with stage 1 through stage 4 chronic kidney disease, or unspecified chronic kidney disease: Secondary | ICD-10-CM | POA: Diagnosis not present

## 2018-09-10 DIAGNOSIS — D631 Anemia in chronic kidney disease: Secondary | ICD-10-CM

## 2018-09-10 DIAGNOSIS — Z7984 Long term (current) use of oral hypoglycemic drugs: Secondary | ICD-10-CM | POA: Diagnosis not present

## 2018-09-10 DIAGNOSIS — D500 Iron deficiency anemia secondary to blood loss (chronic): Secondary | ICD-10-CM

## 2018-09-10 DIAGNOSIS — Z79899 Other long term (current) drug therapy: Secondary | ICD-10-CM | POA: Insufficient documentation

## 2018-09-10 DIAGNOSIS — E785 Hyperlipidemia, unspecified: Secondary | ICD-10-CM | POA: Insufficient documentation

## 2018-09-10 DIAGNOSIS — R5383 Other fatigue: Secondary | ICD-10-CM | POA: Insufficient documentation

## 2018-09-10 LAB — CBC WITH DIFFERENTIAL/PLATELET
Abs Immature Granulocytes: 0.01 10*3/uL (ref 0.00–0.07)
Basophils Absolute: 0 10*3/uL (ref 0.0–0.1)
Basophils Relative: 1 %
Eosinophils Absolute: 0.1 10*3/uL (ref 0.0–0.5)
Eosinophils Relative: 3 %
HCT: 31.6 % — ABNORMAL LOW (ref 36.0–46.0)
Hemoglobin: 10.2 g/dL — ABNORMAL LOW (ref 12.0–15.0)
Immature Granulocytes: 0 %
Lymphocytes Relative: 42 %
Lymphs Abs: 1.5 10*3/uL (ref 0.7–4.0)
MCH: 29.7 pg (ref 26.0–34.0)
MCHC: 32.3 g/dL (ref 30.0–36.0)
MCV: 92.1 fL (ref 80.0–100.0)
Monocytes Absolute: 0.3 10*3/uL (ref 0.1–1.0)
Monocytes Relative: 8 %
Neutro Abs: 1.7 10*3/uL (ref 1.7–7.7)
Neutrophils Relative %: 46 %
Platelets: 172 10*3/uL (ref 150–400)
RBC: 3.43 MIL/uL — ABNORMAL LOW (ref 3.87–5.11)
RDW: 13.4 % (ref 11.5–15.5)
WBC: 3.7 10*3/uL — ABNORMAL LOW (ref 4.0–10.5)
nRBC: 0 % (ref 0.0–0.2)

## 2018-09-10 MED ORDER — FERROUS SULFATE 325 (65 FE) MG PO TBEC: 325.0000 mg | DELAYED_RELEASE_TABLET | Freq: Two times a day (BID) | ORAL | 1 refills | Status: DC

## 2018-09-10 NOTE — Patient Instructions (Signed)
Emma Campbell , Thank you for taking time to come for your Medicare Wellness Visit. I appreciate your ongoing commitment to your health goals. Please review the following plan we discussed and let me know if I can assist you in the future.   Screening recommendations/referrals: Colonoscopy: done 11/06/16 Mammogram: no longer required Bone Density: done 07/19/16 Recommended yearly ophthalmology/optometry visit for glaucoma screening and checkup Recommended yearly dental visit for hygiene and checkup  Vaccinations: Influenza vaccine: done 12/17/17 Pneumococcal vaccine: done 07/31/14 Tdap vaccine: done 10/16/16 Shingles vaccine: Shingrix discussed. Please contact your pharmacy for coverage information.   Advanced directives: Advance directive discussed with you today. I have mailed a copy for you to complete at home and have notarized. Once this is complete please bring a copy in to our office so we can scan it into your chart.  Conditions/risks identified: Keep up the great work!  Next appointment: Please follow up in one year for your Medicare Annual Wellness visit.     Preventive Care 84 Years and Older, Female Preventive care refers to lifestyle choices and visits with your health care provider that can promote health and wellness. What does preventive care include?  A yearly physical exam. This is also called an annual well check.  Dental exams once or twice a year.  Routine eye exams. Ask your health care provider how often you should have your eyes checked.  Personal lifestyle choices, including:  Daily care of your teeth and gums.  Regular physical activity.  Eating a healthy diet.  Avoiding tobacco and drug use.  Limiting alcohol use.  Practicing safe sex.  Taking low-dose aspirin every day.  Taking vitamin and mineral supplements as recommended by your health care provider. What happens during an annual well check? The services and screenings done by your health care  provider during your annual well check will depend on your age, overall health, lifestyle risk factors, and family history of disease. Counseling  Your health care provider may ask you questions about your:  Alcohol use.  Tobacco use.  Drug use.  Emotional well-being.  Home and relationship well-being.  Sexual activity.  Eating habits.  History of falls.  Memory and ability to understand (cognition).  Work and work Statistician.  Reproductive health. Screening  You may have the following tests or measurements:  Height, weight, and BMI.  Blood pressure.  Lipid and cholesterol levels. These may be checked every 5 years, or more frequently if you are over 59 years old.  Skin check.  Lung cancer screening. You may have this screening every year starting at age 42 if you have a 30-pack-year history of smoking and currently smoke or have quit within the past 15 years.  Fecal occult blood test (FOBT) of the stool. You may have this test every year starting at age 32.  Flexible sigmoidoscopy or colonoscopy. You may have a sigmoidoscopy every 5 years or a colonoscopy every 10 years starting at age 48.  Hepatitis C blood test.  Hepatitis B blood test.  Sexually transmitted disease (STD) testing.  Diabetes screening. This is done by checking your blood sugar (glucose) after you have not eaten for a while (fasting). You may have this done every 1-3 years.  Bone density scan. This is done to screen for osteoporosis. You may have this done starting at age 10.  Mammogram. This may be done every 1-2 years. Talk to your health care provider about how often you should have regular mammograms. Talk with your health care  provider about your test results, treatment options, and if necessary, the need for more tests. Vaccines  Your health care provider may recommend certain vaccines, such as:  Influenza vaccine. This is recommended every year.  Tetanus, diphtheria, and acellular  pertussis (Tdap, Td) vaccine. You may need a Td booster every 10 years.  Zoster vaccine. You may need this after age 4.  Pneumococcal 13-valent conjugate (PCV13) vaccine. One dose is recommended after age 46.  Pneumococcal polysaccharide (PPSV23) vaccine. One dose is recommended after age 82. Talk to your health care provider about which screenings and vaccines you need and how often you need them. This information is not intended to replace advice given to you by your health care provider. Make sure you discuss any questions you have with your health care provider. Document Released: 03/12/2015 Document Revised: 11/03/2015 Document Reviewed: 12/15/2014 Elsevier Interactive Patient Education  2017 [employer] Prevention in the Home Falls can cause injuries. They can happen to people of all ages. There are many things you can do to make your home safe and to help prevent falls. What can I do on the outside of my home?  Regularly fix the edges of walkways and driveways and fix any cracks.  Remove anything that might make you trip as you walk through a door, such as a raised step or threshold.  Trim any bushes or trees on the path to your home.  Use bright outdoor lighting.  Clear any walking paths of anything that might make someone trip, such as rocks or tools.  Regularly check to see if handrails are loose or broken. Make sure that both sides of any steps have handrails.  Any raised decks and porches should have guardrails on the edges.  Have any leaves, snow, or ice cleared regularly.  Use sand or salt on walking paths during winter.  Clean up any spills in your garage right away. This includes oil or grease spills. What can I do in the bathroom?  Use night lights.  Install grab bars by the toilet and in the tub and shower. Do not use towel bars as grab bars.  Use non-skid mats or decals in the tub or shower.  If you need to sit down in the shower, use a plastic,  non-slip stool.  Keep the floor dry. Clean up any water that spills on the floor as soon as it happens.  Remove soap buildup in the tub or shower regularly.  Attach bath mats securely with double-sided non-slip rug tape.  Do not have throw rugs and other things on the floor that can make you trip. What can I do in the bedroom?  Use night lights.  Make sure that you have a light by your bed that is easy to reach.  Do not use any sheets or blankets that are too big for your bed. They should not hang down onto the floor.  Have a firm chair that has side arms. You can use this for support while you get dressed.  Do not have throw rugs and other things on the floor that can make you trip. What can I do in the kitchen?  Clean up any spills right away.  Avoid walking on wet floors.  Keep items that you use a lot in easy-to-reach places.  If you need to reach something above you, use a strong step stool that has a grab bar.  Keep electrical cords out of the way.  Do not use floor polish  or wax that makes floors slippery. If you must use wax, use non-skid floor wax.  Do not have throw rugs and other things on the floor that can make you trip. What can I do with my stairs?  Do not leave any items on the stairs.  Make sure that there are handrails on both sides of the stairs and use them. Fix handrails that are broken or loose. Make sure that handrails are as long as the stairways.  Check any carpeting to make sure that it is firmly attached to the stairs. Fix any carpet that is loose or worn.  Avoid having throw rugs at the top or bottom of the stairs. If you do have throw rugs, attach them to the floor with carpet tape.  Make sure that you have a light switch at the top of the stairs and the bottom of the stairs. If you do not have them, ask someone to add them for you. What else can I do to help prevent falls?  Wear shoes that:  Do not have high heels.  Have rubber  bottoms.  Are comfortable and fit you well.  Are closed at the toe. Do not wear sandals.  If you use a stepladder:  Make sure that it is fully opened. Do not climb a closed stepladder.  Make sure that both sides of the stepladder are locked into place.  Ask someone to hold it for you, if possible.  Clearly mark and make sure that you can see:  Any grab bars or handrails.  First and last steps.  Where the edge of each step is.  Use tools that help you move around (mobility aids) if they are needed. These include:  Canes.  Walkers.  Scooters.  Crutches.  Turn on the lights when you go into a dark area. Replace any light bulbs as soon as they burn out.  Set up your furniture so you have a clear path. Avoid moving your furniture around.  If any of your floors are uneven, fix them.  If there are any pets around you, be aware of where they are.  Review your medicines with your doctor. Some medicines can make you feel dizzy. This can increase your chance of falling. Ask your doctor what other things that you can do to help prevent falls. This information is not intended to replace advice given to you by your health care provider. Make sure you discuss any questions you have with your health care provider. Document Released: 12/10/2008 Document Revised: 07/22/2015 Document Reviewed: 03/20/2014 Elsevier Interactive Patient Education  2017 [employer].

## 2018-09-10 NOTE — Progress Notes (Signed)
Hematology/Oncology follow up note [facility] [phone] [phone]   Patient Care Team: Steele Sizer, MD as PCP - General (Family Medicine) Yolonda Kida, MD as Consulting Physician (Cardiology) Earlie Server, MD as Consulting Physician (Oncology)  REFERRING PROVIDER: Steele Sizer, MD REASON FOR VISIT Follow up for treatment of anemia  HISTORY OF PRESENTING ILLNESS:  Emma Campbell is a  85 y.o.  female with PMH listed below who was referred to me for evaluation of anemia.  Patient follows up with primary care physician and has had labs done recently. 04/17/17 CBC showed hemoglobin 9, MCV 81.9, platelet 245,000, WBC 4.1, normal differential.  Iron panel showed TIBC 442, ferritin 11, saturation 15%. Previous GI workup:  # 01/23/2017 EGD showed non bleeding angiotecsia, treated with APC. 12/22/2016 Capsule study showed duodenum AVM.  11/06/2016 colonoscopy showed non bleeding hemorroids, sigmoid polyp, and diverticulosis.   INTERVAL HISTORY Emma Campbell is a 85 y.o. female who has above history reviewed by me today presents for follow up for management of iron deficiency anemia.  Reports feeling well. Fatigue is at baseline.  Denies hematochezia, hematuria, hematemesis, epistaxis, black tarry stool or easy bruising.  Appetite is fair.  . Review of Systems  Constitutional: Positive for malaise/fatigue. Negative for chills, fever and weight loss.  HENT: Negative for sore throat.   Eyes: Negative for redness.  Respiratory: Negative for cough, shortness of breath and wheezing.   Cardiovascular: Negative for chest pain, palpitations and leg swelling.  Gastrointestinal: Negative for abdominal pain, blood in stool, nausea and vomiting.  Genitourinary: Negative for dysuria.  Musculoskeletal: Negative for myalgias.  Skin: Negative for rash.  Neurological: Negative for dizziness, tingling and tremors.  Endo/Heme/Allergies: Does not  bruise/bleed easily.  Psychiatric/Behavioral: Negative for hallucinations.    MEDICAL HISTORY:  Past Medical History:  Diagnosis Date  . Anemia   . Chronic kidney disease, stage III (moderate) (HCC)   . Diverticulosis   . Elevated LFTs   . Essential hypertension, malignant   . Fibrocystic breast disease   . Heart murmur   . Hyperlipidemia   . Menopausal problem   . Osteoarthrosis   . Osteoporosis   . Peripheral autonomic neuropathy   . Sick sinus syndrome (HCC)    Dr. Alfredo Batty  . Type II diabetes mellitus with renal manifestations ([place])   . Vitamin D deficiency     SURGICAL HISTORY: Past Surgical History:  Procedure Laterality Date  . COLONOSCOPY WITH PROPOFOL N/A 11/06/2016   Procedure: COLONOSCOPY WITH PROPOFOL;  Surgeon: Lucilla Lame, MD;  Location: [place];  Service: Endoscopy;  Laterality: N/A;  DIABETIC  . ESOPHAGOGASTRODUODENOSCOPY (EGD) WITH PROPOFOL N/A 11/06/2016   Procedure: ESOPHAGOGASTRODUODENOSCOPY (EGD) WITH PROPOFOL;  Surgeon: Lucilla Lame, MD;  Location: [place];  Service: Endoscopy;  Laterality: N/A;  . ESOPHAGOGASTRODUODENOSCOPY (EGD) WITH PROPOFOL N/A 01/23/2017   Procedure: ESOPHAGOGASTRODUODENOSCOPY (EGD) WITH PROPOFOL with PUSH;  Surgeon: Lucilla Lame, MD;  Location: [facility] ENDOSCOPY;  Service: Endoscopy;  Laterality: N/A;  . GIVENS CAPSULE STUDY N/A 12/22/2016   Procedure: GIVENS CAPSULE STUDY;  Surgeon: Lucilla Lame, MD;  Location: [facility] ENDOSCOPY;  Service: Endoscopy;  Laterality: N/A;  . PACEMAKER INSERTION  2006  . POLYPECTOMY  11/06/2016   Procedure: POLYPECTOMY;  Surgeon: Lucilla Lame, MD;  Location: [place];  Service: Endoscopy;;    SOCIAL HISTORY: Social History   Socioeconomic History  . Marital status: Widowed    Spouse name: Shanon Brow  . Number of children: 1  . Years  of education: 2 years of college  . Highest education level: 12th grade  Occupational History  . Occupation: Retired Agricultural engineer at  [employer]  . Financial resource strain: Not hard at all  . Food insecurity    Worry: Never true    Inability: Never true  . Transportation needs    Medical: No    Non-medical: No  Tobacco Use  . Smoking status: Never Smoker  . Smokeless tobacco: Never Used  . Tobacco comment: smoking cessation materials not required  Substance and Sexual Activity  . Alcohol use: No    Alcohol/week: 0.0 standard drinks  . Drug use: No  . Sexual activity: Never  Lifestyle  . Physical activity    Days per week: 7 days    Minutes per session: 30 min  . Stress: Not at all  Relationships  . Social connections    Talks on phone: More than three times a week    Gets together: More than three times a week    Attends religious service: More than 4 times per year    Active member of club or organization: Yes    Attends meetings of clubs or organizations: Never    Relationship status: Widowed  . Intimate partner violence    Fear of current or ex partner: No    Emotionally abused: No    Physically abused: No    Forced sexual activity: No  Other Topics Concern  . Not on file  Social History Narrative   She still has a house but her daughter and grand-daughter are staying at her house   She moved in with her older sister ( 64 years older), to help her out.     FAMILY HISTORY: Family History  Problem Relation Age of Onset  . Hypertension Mother   . Stroke Mother   . Diabetes Daughter   . Stroke Father   . Healthy Sister   . Hypertension Maternal Grandmother     ALLERGIES:  is allergic to ace inhibitors.  MEDICATIONS:  Current Outpatient Medications  Medication Sig Dispense Refill  . amiodarone (PACERONE) 200 MG tablet Take 100 mg by mouth daily.    Marland Kitchen amLODipine-valsartan (EXFORGE) 5-320 MG tablet TAKE 1 TABLET EVERY DAY 90 tablet 1  . aspirin 81 MG tablet Take 81 mg by mouth daily.    Marland Kitchen atorvastatin (LIPITOR) 20 MG tablet Take 1 tablet (20 mg total) by mouth daily. 90  tablet 1  . cholecalciferol (VITAMIN D) 1000 UNITS tablet Take 200 Units by mouth daily.    . ferrous sulfate 325 (65 FE) MG EC tablet Take 1 tablet (325 mg total) by mouth 2 (two) times daily with a meal. 829 tablet 1  . folic acid (FOLVITE) 562 MCG tablet Take 400 mcg by mouth daily.    . metFORMIN (GLUCOPHAGE-XR) 750 MG 24 hr tablet TAKE 1 TABLET EVERY EVENING 90 tablet 1   Current Facility-Administered Medications  Medication Dose Route Frequency Provider Last Rate Last Dose  . cyanocobalamin ((VITAMIN B-12)) injection 1,000 mcg  1,000 mcg Intramuscular Q30 days Steele Sizer, MD   1,000 mcg at 07/08/18 1308   Facility-Administered Medications Ordered in Other Visits  Medication Dose Route Frequency Provider Last Rate Last Dose  . 0.9 %  sodium chloride infusion   Intravenous Continuous Earlie Server, MD 10 mL/hr at 06/19/18 1339       PHYSICAL EXAMINATION: ECOG PERFORMANCE STATUS: 1 - Symptomatic but completely ambulatory Vitals:   09/10/18  1416  BP: (!) 141/70  Pulse: 78  Resp: 18  Temp: 99.7 F (37.6 C)   Filed Weights   09/10/18 1416  Weight: 114 lb 11.2 oz (52 kg)    Physical Exam  Constitutional: She is oriented to person, place, and time. No distress.  Thin. Walk in   HENT:  Head: Normocephalic and atraumatic.  Mouth/Throat: No oropharyngeal exudate.  Eyes: Pupils are equal, round, and reactive to light. EOM are normal. Left eye exhibits no discharge. No scleral icterus.  Neck: Normal range of motion. Neck supple. No JVD present.  Cardiovascular: Normal rate, regular rhythm and normal heart sounds. Exam reveals no friction rub.  No murmur heard. Pulmonary/Chest: Effort normal and breath sounds normal. No respiratory distress. She has no wheezes. She has no rales. She exhibits no tenderness.  Abdominal: Soft. Bowel sounds are normal. She exhibits no distension. There is no abdominal tenderness.  Musculoskeletal: Normal range of motion.        General: No tenderness  or edema.  Lymphadenopathy:    She has no cervical adenopathy.  Neurological: She is alert and oriented to person, place, and time. No cranial nerve deficit. She exhibits normal muscle tone. Coordination normal.  Skin: Skin is warm and dry. No rash noted. She is not diaphoretic. No erythema.  Psychiatric: Affect and judgment normal.     LABORATORY DATA:  I have reviewed the data as listed Lab Results  Component Value Date   WBC 3.7 (L) 09/10/2018   HGB 10.2 (L) 09/10/2018   HCT 31.6 (L) 09/10/2018   MCV 92.1 09/10/2018   PLT 172 09/10/2018   Recent Labs    12/17/17 1121 04/19/18 1044  NA 138 140  K 4.2 4.2  CL 100 103  CO2 27 26  GLUCOSE 99 105*  BUN 19 16  CREATININE 1.19* 1.03*  CALCIUM 9.6 9.3  GFRNONAA 42* 50*  GFRAA 49* 58*  PROT 7.2 6.9  AST 16 17  ALT 11 12  BILITOT 0.4 0.5    Iron/TIBC/Ferritin/ %Sat    Component Value Date/Time   IRON 86 07/08/2018 1115   TIBC 271 07/08/2018 1115   FERRITIN 297 07/08/2018 1115   IRONPCTSAT 32 (H) 07/08/2018 1115   IRONPCTSAT 15 04/11/2016 1103   SPEP not observed M spike.   ASSESSMENT & PLAN:  1. Iron deficiency anemia due to chronic blood loss   2. Anemia of chronic renal failure, stage 3 (moderate) (HCC)    # Iron deficiency anemia, Labs are reviewed and discussed with patient.  Stable hemoglobin, 10.2 Hold additional IV Venofer.  Continue oral iron supplementations. Rx sent to pharmacy  # anemia of CKD.  Hemoglobin >10, hold erythropoietin treatments.  We will recheck iron TIBC ferritin CBC in 3 months. Possible retacrit if Hb<10   Orders Placed This Encounter  Procedures  . CBC with Differential/Platelet    Standing Status:   Future    Number of Occurrences:   1    Standing Expiration Date:   09/10/2019  . CBC with Differential/Platelet    Standing Status:   Future    Standing Expiration Date:   09/10/2019   All questions were answered. The patient knows to call the clinic with any problems  questions or concerns.  Return of visit: 3 months.  Total face to face encounter time for this patient visit was 15 min. >50% of the time was  spent in counseling and coordination of care.    Earlie Server, MD, PhD Hematology  [facility] [place]- [phone] 09/10/2018

## 2018-09-10 NOTE — Progress Notes (Signed)
Subjective:   Emma Campbell is a 85 y.o. female who presents for Medicare Annual (Subsequent) preventive examination.  Virtual Visit via Telephone Note  I connected with Emma Campbell on 09/10/18 at 11:20 AM EDT by telephone and verified that I am speaking with the correct person using two identifiers.  Medicare Annual Wellness visit completed telephonically due to Covid-19 pandemic.   Location: Patient: home Provider: office   I discussed the limitations, risks, security and privacy concerns of performing an evaluation and management service by telephone and the availability of in person appointments. The patient expressed understanding and agreed to proceed.  Some vital signs may be absent or patient reported.   Clemetine Marker, LPN  Review of Systems:   Cardiac Risk Factors include: advanced age (>48men, >65 women);diabetes mellitus;dyslipidemia;hypertension     Objective:     Vitals: BP 133/64   Pulse 67   Ht 5\' 1"  (1.549 m)   Wt 118 lb (53.5 kg)   BMI 22.30 kg/m   Body mass index is 22.3 kg/m.  Advanced Directives 09/10/2018 07/09/2018 06/11/2018 03/11/2018 11/08/2017 08/06/2017 05/29/2017  Does Patient Have a Medical Advance Directive? No No No No No No No  Type of Advance Directive - - - - - - -  Does patient want to make changes to medical advance directive? - - - - - - -  Copy of [place] in Chart? - - - - - - -  Would patient like information on creating a medical advance directive? Yes (MAU/Ambulatory/Procedural Areas - Information given) - - No - Patient declined - - No - Patient declined    Tobacco Social History   Tobacco Use  Smoking Status Never Smoker  Smokeless Tobacco Never Used  Tobacco Comment   smoking cessation materials not required     Counseling given: Not Answered Comment: smoking cessation materials not required   Clinical Intake:  Pre-visit preparation completed: Yes  Pain : No/denies pain     BMI - recorded:  22.3 Nutritional Status: BMI of 19-24  Normal Nutritional Risks: None Diabetes: Yes CBG done?: No Did pt. bring in CBG monitor from home?: No   Nutrition Risk Assessment:  Has the patient had any N/V/D within the last 2 months?  No  Does the patient have any non-healing wounds?  No  Has the patient had any unintentional weight loss or weight gain?  No   Diabetes:  Is the patient diabetic?  Yes  If diabetic, was a CBG obtained today?  No  Did the patient bring in their glucometer from home?  No  How often do you monitor your CBG's? daily.   Financial Strains and Diabetes Management:  Are you having any financial strains with the device, your supplies or your medication? No .  Does the patient want to be seen by Chronic Care Management for management of their diabetes?  No  Would the patient like to be referred to a Nutritionist or for Diabetic Management?  No   Diabetic Exams:  Diabetic Eye Exam: Completed 12/26/17 negative retinopathy.   Diabetic Foot Exam: Completed 12/17/17.   How often do you need to have someone help you when you read instructions, pamphlets, or other written materials from your doctor or pharmacy?: 1 - Never  Interpreter Needed?: No  Information entered by :: Clemetine Marker LPN  Past Medical History:  Diagnosis Date  . Anemia   . Chronic kidney disease, stage III (moderate) (HCC)   . Diverticulosis   .  Elevated LFTs   . Essential hypertension, malignant   . Fibrocystic breast disease   . Heart murmur   . Hyperlipidemia   . Menopausal problem   . Osteoarthrosis   . Osteoporosis   . Peripheral autonomic neuropathy   . Sick sinus syndrome (HCC)    Dr. Alfredo Batty  . Type II diabetes mellitus with renal manifestations ([place])   . Vitamin D deficiency    Past Surgical History:  Procedure Laterality Date  . COLONOSCOPY WITH PROPOFOL N/A 11/06/2016   Procedure: COLONOSCOPY WITH PROPOFOL;  Surgeon: Lucilla Lame, MD;  Location: [place];  Service: Endoscopy;  Laterality: N/A;  DIABETIC  . ESOPHAGOGASTRODUODENOSCOPY (EGD) WITH PROPOFOL N/A 11/06/2016   Procedure: ESOPHAGOGASTRODUODENOSCOPY (EGD) WITH PROPOFOL;  Surgeon: Lucilla Lame, MD;  Location: [place];  Service: Endoscopy;  Laterality: N/A;  . ESOPHAGOGASTRODUODENOSCOPY (EGD) WITH PROPOFOL N/A 01/23/2017   Procedure: ESOPHAGOGASTRODUODENOSCOPY (EGD) WITH PROPOFOL with PUSH;  Surgeon: Lucilla Lame, MD;  Location: [facility] ENDOSCOPY;  Service: Endoscopy;  Laterality: N/A;  . GIVENS CAPSULE STUDY N/A 12/22/2016   Procedure: GIVENS CAPSULE STUDY;  Surgeon: Lucilla Lame, MD;  Location: [facility] ENDOSCOPY;  Service: Endoscopy;  Laterality: N/A;  . PACEMAKER INSERTION  2006  . POLYPECTOMY  11/06/2016   Procedure: POLYPECTOMY;  Surgeon: Lucilla Lame, MD;  Location: [place];  Service: Endoscopy;;   Family History  Problem Relation Age of Onset  . Hypertension Mother   . Stroke Mother   . Diabetes Daughter   . Stroke Father   . Healthy Sister   . Hypertension Maternal Grandmother    Social History   Socioeconomic History  . Marital status: Widowed    Spouse name: Shanon Brow  . Number of children: 1  . Years of education: 2 years of college  . Highest education level: 12th grade  Occupational History  . Occupation: Retired Agricultural engineer at [employer]  . Financial resource strain: Not hard at all  . Food insecurity    Worry: Never true    Inability: Never true  . Transportation needs    Medical: No    Non-medical: No  Tobacco Use  . Smoking status: Never Smoker  . Smokeless tobacco: Never Used  . Tobacco comment: smoking cessation materials not required  Substance and Sexual Activity  . Alcohol use: No    Alcohol/week: 0.0 standard drinks  . Drug use: No  . Sexual activity: Never  Lifestyle  . Physical activity    Days per week: 7 days    Minutes per session: 30 min  . Stress: Not at all  Relationships  . Social connections     Talks on phone: More than three times a week    Gets together: More than three times a week    Attends religious service: More than 4 times per year    Active member of club or organization: Yes    Attends meetings of clubs or organizations: Never    Relationship status: Widowed  Other Topics Concern  . Not on file  Social History Narrative   She still has a house but her daughter and grand-daughter are staying at her house   She moved in with her older sister ( 82 years older), to help her out.     Outpatient Encounter Medications as of 09/10/2018  Medication Sig  . amiodarone (PACERONE) 200 MG tablet Take 100 mg by mouth daily.  Marland Kitchen amLODipine-valsartan (EXFORGE) 5-320 MG tablet TAKE 1 TABLET EVERY DAY  .  aspirin 81 MG tablet Take 81 mg by mouth daily.  Marland Kitchen atorvastatin (LIPITOR) 20 MG tablet Take 1 tablet (20 mg total) by mouth daily.  . cholecalciferol (VITAMIN D) 1000 UNITS tablet Take 200 Units by mouth daily.  . ferrous sulfate 325 (65 FE) MG EC tablet Take 1 tablet (325 mg total) by mouth 2 (two) times daily with a meal.  . folic acid (FOLVITE) 419 MCG tablet Take 400 mcg by mouth daily.  . metFORMIN (GLUCOPHAGE-XR) 750 MG 24 hr tablet TAKE 1 TABLET EVERY EVENING   Facility-Administered Encounter Medications as of 09/10/2018  Medication  . 0.9 %  sodium chloride infusion  . cyanocobalamin ((VITAMIN B-12)) injection 1,000 mcg    Activities of Daily Living In your present state of health, do you have any difficulty performing the following activities: 09/10/2018 08/26/2018  Hearing? N N  Comment declines hearing aids -  Vision? Y Y  Comment cataracts -  Difficulty concentrating or making decisions? N N  Walking or climbing stairs? N N  Dressing or bathing? N N  Doing errands, shopping? N N  Preparing Food and eating ? N -  Using the Toilet? N -  In the past six months, have you accidently leaked urine? N -  Do you have problems with loss of bowel control? N -  Managing  your Medications? N -  Managing your Finances? N -  Housekeeping or managing your Housekeeping? N -  Some recent data might be hidden    Patient Care Team: Steele Sizer, MD as PCP - General (Family Medicine) Yolonda Kida, MD as Consulting Physician (Cardiology) Earlie Server, MD as Consulting Physician (Oncology)    Assessment:   This is a routine wellness examination for [place].  Exercise Activities and Dietary recommendations Current Exercise Habits: Home exercise routine, Type of exercise: walking, Time (Minutes): 30, Frequency (Times/Week): 7, Weekly Exercise (Minutes/Week): 210, Intensity: Mild, Exercise limited by: None identified  Goals    . DIET - INCREASE WATER INTAKE     Recommend to drink at least 6-8 8oz glasses of water per day.       Fall Risk Fall Risk  09/10/2018 08/26/2018 04/19/2018 04/19/2018 12/17/2017  Falls in the past year? 0 0 0 0 No  Number falls in past yr: 0 0 0 - -  Injury with Fall? 0 0 0 - -  Comment - - - - -  Risk for fall due to : - - - - -  Risk for fall due to: Comment - - - - -  Follow up Falls prevention discussed - - - -   FALL RISK PREVENTION PERTAINING TO THE HOME:  Any stairs in or around the home? Yes  If so, do they handrails? Yes   Home free of loose throw rugs in walkways, pet beds, electrical cords, etc? Yes  Adequate lighting in your home to reduce risk of falls? Yes   ASSISTIVE DEVICES UTILIZED TO PREVENT FALLS:  Life alert? No  Use of a cane, walker or w/c? No  Grab bars in the bathroom? No  Shower chair or bench in shower? Yes  Elevated toilet seat or a handicapped toilet? Yes   DME ORDERS:  DME order needed?  No   TIMED UP AND GO:  Was the test performed? No . Telephonic visit.   Education: Fall risk prevention has been discussed.  Intervention(s) required? No   Depression Screen PHQ 2/9 Scores 09/10/2018 08/26/2018 05/21/2018 04/19/2018  PHQ - 2 Score 0  0 - 0  PHQ- 9 Score - 0 - 0  Exception Documentation  - - Medical reason -     Cognitive Function     6CIT Screen 09/10/2018 05/18/2017  What Year? 0 points 0 points  What month? 0 points 0 points  What time? 0 points 3 points  Count back from 20 0 points 0 points  Months in reverse 2 points 0 points  Repeat phrase 0 points 0 points  Total Score 2 3    Immunization History  Administered Date(s) Administered  . Influenza, High Dose Seasonal PF 11/30/2014, 12/09/2015, 12/11/2016, 12/17/2017  . Influenza-Unspecified 10/28/2013  . Pneumococcal Conjugate-13 07/31/2014  . Pneumococcal Polysaccharide-23 08/11/2009  . Tdap 08/11/2009, 10/16/2016  . Zoster 12/19/2011    Qualifies for Shingles Vaccine? Yes  Zostavax completed 2013. Due for Shingrix. Education has been provided regarding the importance of this vaccine. Pt has been advised to call insurance company to determine out of pocket expense. Advised may also receive vaccine at local pharmacy or Health Dept. Verbalized acceptance and understanding.  Tdap: Up to date  Flu Vaccine: Up to date  Pneumococcal Vaccine: Up to date   Screening Tests Health Maintenance  Topic Date Due  . INFLUENZA VACCINE  09/28/2018  . HEMOGLOBIN A1C  10/18/2018  . FOOT EXAM  12/18/2018  . OPHTHALMOLOGY EXAM  12/27/2018  . TETANUS/TDAP  10/17/2026  . DEXA SCAN  Completed  . PNA vac Low Risk Adult  Completed    Cancer Screenings:  Colorectal Screening: Completed 11/06/16. No longer required.   Mammogram:  No longer required.   Bone Density: Completed 07/19/16. Results reflect OSTEOPENIA. Repeat every 2 years.   Lung Cancer Screening: (Low Dose CT Chest recommended if Age 21-80 years, 30 pack-year currently smoking OR have quit w/in 15years.) does not qualify.   Additional Screening:  Hepatitis C Screening: no longer required  Vision Screening: Recommended annual ophthalmology exams for early detection of glaucoma and other disorders of the eye. Is the patient up to date with their annual eye  exam?  Yes  Who is the provider or what is the name of the office in which the pt attends annual eye exams? [place] Screening: Recommended annual dental exams for proper oral hygiene  Community Resource Referral:  CRR required this visit?  No      Plan:    I have personally reviewed and addressed the Medicare Annual Wellness questionnaire and have noted the following in the patient's chart:  A. Medical and social history B. Use of alcohol, tobacco or illicit drugs  C. Current medications and supplements D. Functional ability and status E.  Nutritional status F.  Physical activity G. Advance directives H. List of other physicians I.  Hospitalizations, surgeries, and ER visits in previous 12 months J.  [place] such as hearing and vision if needed, cognitive and depression L. Referrals and appointments   In addition, I have reviewed and discussed with patient certain preventive protocols, quality metrics, and best practice recommendations. A written personalized care plan for preventive services as well as general preventive health recommendations were provided to patient.   Signed,  Clemetine Marker, LPN Nurse Health Advisor   Nurse Notes: pt doing well and appreciative of visit today.

## 2018-09-10 NOTE — Progress Notes (Signed)
Patient does not offer any problems today.  

## 2018-09-20 ENCOUNTER — Ambulatory Visit (INDEPENDENT_AMBULATORY_CARE_PROVIDER_SITE_OTHER): Payer: Medicare HMO | Admitting: Family Medicine

## 2018-09-20 ENCOUNTER — Other Ambulatory Visit: Payer: Self-pay

## 2018-09-20 ENCOUNTER — Encounter: Payer: Self-pay | Admitting: Family Medicine

## 2018-09-20 DIAGNOSIS — R600 Localized edema: Secondary | ICD-10-CM | POA: Diagnosis not present

## 2018-09-20 DIAGNOSIS — I10 Essential (primary) hypertension: Secondary | ICD-10-CM | POA: Diagnosis not present

## 2018-09-20 NOTE — Progress Notes (Signed)
Name: Emma Campbell   MRN: [id number]    DOB: [birth year]   Date:09/20/2018       Progress Note  Subjective  Chief Complaint  Chief Complaint  Patient presents with  . Hypertension    I connected with  Emma Campbell on 09/20/18 at  3:20 PM EDT by telephone and verified that I am speaking with the correct person using two identifiers.  I discussed the limitations, risks, security and privacy concerns of performing an evaluation and management service by telephone and the availability of in person appointments. Staff also discussed with the patient that there may be a patient responsible charge related to this service. Patient Location: at home  Provider Location: [facility]   HPI  BP problems: Tuesday am was 126/67 in am, in the pm 162/74, yesterday 138/68 in am, this am 117/65 - after she walked this am. She denies chest pain, SOB or decrease in exercise tolerance, she states occasionally has mild lower extremity edema but resolves by itself. She just go the bp monitor two weeks ago and is surprised by the variability   Patient Active Problem List   Diagnosis Date Noted  . B12 deficiency 05/21/2017  . Gastrointestinal tract imaging abnormality   . Iron deficiency anemia   . Polyp of sigmoid colon   . Type 2 diabetes mellitus with stage 2 chronic kidney disease, without long-term current use of insulin ([place]) 08/03/2015  . Arthritis, degenerative 05/03/2015  . Menopausal and perimenopausal disorder 05/03/2015  . Chronic kidney disease (CKD), stage III (moderate) ([place]) 11/30/2014  . High risk medication use 11/30/2014  . Diverticulosis 11/30/2014  . Fibrocystic breast disease 11/30/2014  . Elevated LFTs 11/30/2014  . Systolic ejection murmur 83/15/1761  . Paroxysmal A-fib ([place]) 11/30/2014  . Microalbuminuria 07/31/2014  . Elevated TSH 07/31/2014  . History of cardiac pacemaker 07/31/2014  . Sick sinus syndrome ([place]) 08/13/2013  . Hypercholesteremia 08/13/2013   . Hypertension, benign 08/13/2013  . Diabetes mellitus with renal manifestations, controlled ([place]) 08/13/2013  . Avitaminosis D 04/12/2009    Past Surgical History:  Procedure Laterality Date  . COLONOSCOPY WITH PROPOFOL N/A 11/06/2016   Procedure: COLONOSCOPY WITH PROPOFOL;  Surgeon: Lucilla Lame, MD;  Location: [place];  Service: Endoscopy;  Laterality: N/A;  DIABETIC  . ESOPHAGOGASTRODUODENOSCOPY (EGD) WITH PROPOFOL N/A 11/06/2016   Procedure: ESOPHAGOGASTRODUODENOSCOPY (EGD) WITH PROPOFOL;  Surgeon: Lucilla Lame, MD;  Location: [place];  Service: Endoscopy;  Laterality: N/A;  . ESOPHAGOGASTRODUODENOSCOPY (EGD) WITH PROPOFOL N/A 01/23/2017   Procedure: ESOPHAGOGASTRODUODENOSCOPY (EGD) WITH PROPOFOL with PUSH;  Surgeon: Lucilla Lame, MD;  Location: [facility] ENDOSCOPY;  Service: Endoscopy;  Laterality: N/A;  . GIVENS CAPSULE STUDY N/A 12/22/2016   Procedure: GIVENS CAPSULE STUDY;  Surgeon: Lucilla Lame, MD;  Location[facility] ENDOSCOPY;  Service: Endoscopy;  Laterality: N/A;  . PACEMAKER INSERTION  2006  . POLYPECTOMY  11/06/2016   Procedure: POLYPECTOMY;  Surgeon: Lucilla Lame, MD;  Location: [place];  Service: Endoscopy;;    Family History  Problem Relation Age of Onset  . Hypertension Mother   . Stroke Mother   . Diabetes Daughter   . Stroke Father   . Healthy Sister   . Hypertension Maternal Grandmother     Social History   Socioeconomic History  . Marital status: Widowed    Spouse name: Shanon Brow  . Number of children: 1  . Years of education: 2 years of college  . Highest education level: 12th grade  Occupational History  .  Occupation: Retired Agricultural engineer at [employer]  . Financial resource strain: Not hard at all  . Food insecurity    Worry: Never true    Inability: Never true  . Transportation needs    Medical: No    Non-medical: No  Tobacco Use  . Smoking status: Never Smoker  . Smokeless tobacco: Never Used  .  Tobacco comment: smoking cessation materials not required  Substance and Sexual Activity  . Alcohol use: No    Alcohol/week: 0.0 standard drinks  . Drug use: No  . Sexual activity: Never  Lifestyle  . Physical activity    Days per week: 7 days    Minutes per session: 30 min  . Stress: Not at all  Relationships  . Social connections    Talks on phone: More than three times a week    Gets together: More than three times a week    Attends religious service: More than 4 times per year    Active member of club or organization: Yes    Attends meetings of clubs or organizations: Never    Relationship status: Widowed  . Intimate partner violence    Fear of current or ex partner: No    Emotionally abused: No    Physically abused: No    Forced sexual activity: No  Other Topics Concern  . Not on file  Social History Narrative   She still has a house but her daughter and grand-daughter are staying at her house   She moved in with her older sister ( 31 years older), to help her out.      Current Outpatient Medications:  .  amiodarone (PACERONE) 200 MG tablet, Take 100 mg by mouth daily., Disp: , Rfl:  .  amLODipine-valsartan (EXFORGE) 5-320 MG tablet, TAKE 1 TABLET EVERY DAY, Disp: 90 tablet, Rfl: 1 .  aspirin 81 MG tablet, Take 81 mg by mouth daily., Disp: , Rfl:  .  atorvastatin (LIPITOR) 20 MG tablet, Take 1 tablet (20 mg total) by mouth daily., Disp: 90 tablet, Rfl: 1 .  cholecalciferol (VITAMIN D) 1000 UNITS tablet, Take 200 Units by mouth daily., Disp: , Rfl:  .  ferrous sulfate 325 (65 FE) MG EC tablet, Take 1 tablet (325 mg total) by mouth 2 (two) times daily with a meal., Disp: 180 tablet, Rfl: 1 .  folic acid (FOLVITE) 478 MCG tablet, Take 400 mcg by mouth daily., Disp: , Rfl:  .  metFORMIN (GLUCOPHAGE-XR) 750 MG 24 hr tablet, TAKE 1 TABLET EVERY EVENING, Disp: 90 tablet, Rfl: 1  Current Facility-Administered Medications:  .  cyanocobalamin ((VITAMIN B-12)) injection 1,000 mcg,  1,000 mcg, Intramuscular, Q30 days, Steele Sizer, MD, 1,000 mcg at 07/08/18 2956  Facility-Administered Medications Ordered in Other Visits:  .  0.9 %  sodium chloride infusion, , Intravenous, Continuous, Earlie Server, MD, Last Rate: 10 mL/hr at 06/19/18 1339  Allergies  Allergen Reactions  . Ace Inhibitors Other (See Comments)    Pt unable to recall. Was advised to avoid    I personally reviewed active problem list, medication list, allergies, family history, social history with the patient/caregiver today.   ROS  Ten systems reviewed and is negative except as mentioned in HPI    Objective  Virtual encounter, vitals not obtained.  There is no height or weight on file to calculate BMI.  Physical Exam  Awake, alert and oriented  PHQ2/9: Depression screen [facility] 2/9 09/20/2018 09/10/2018 08/26/2018 04/19/2018 12/17/2017  Decreased Interest  0 0 0 0 0  Down, Depressed, Hopeless 0 0 0 0 0  PHQ - 2 Score 0 0 0 0 0  Altered sleeping 0 - 0 0 -  Tired, decreased energy 0 - 0 0 -  Change in appetite 0 - 0 0 -  Feeling bad or failure about yourself  0 - 0 0 -  Trouble concentrating 0 - 0 0 -  Moving slowly or fidgety/restless 0 - 0 0 -  Suicidal thoughts 0 - 0 0 -  PHQ-9 Score 0 - 0 0 -  Difficult doing work/chores Not difficult at all - Not difficult at all Not difficult at all -  Some recent data might be hidden   PHQ-2/9 Result is negative.    Fall Risk: Fall Risk  09/20/2018 09/10/2018 08/26/2018 04/19/2018 04/19/2018  Falls in the past year? 0 0 0 0 0  Number falls in past yr: 0 0 0 0 -  Injury with Fall? 0 0 0 0 -  Comment - - - - -  Risk for fall due to : - - - - -  Risk for fall due to: Comment - - - - -  Follow up - Falls prevention discussed - - -     Assessment & Plan   1. Hypertension, benign  She just started checking her bp and explained that it is normal to oscillate, also advised her to re-check if bp above 145/90 or below 110/60 after 10 minutes of rest. To  also observe if going up after a salty meal or snack. She will continue to track it   2. Lower extremity edema  Mild and occasional, she walks and it is hot, advised to elevate and if does not resolve or associated with SOB, wheezing or decrease in exercise tolerance she will need to follow up, otherwise likely normal   I discussed the assessment and treatment plan with the patient. The patient was provided an opportunity to ask questions and all were answered. The patient agreed with the plan and demonstrated an understanding of the instructions.   The patient was advised to call back or seek an in-person evaluation if the symptoms worsen or if the condition fails to improve as anticipated.  I provided 15  minutes of non-face-to-face time during this encounter.  Loistine Chance, MD

## 2018-11-07 ENCOUNTER — Other Ambulatory Visit: Payer: Self-pay | Admitting: Family Medicine

## 2018-11-07 DIAGNOSIS — E785 Hyperlipidemia, unspecified: Secondary | ICD-10-CM

## 2018-11-14 ENCOUNTER — Telehealth: Payer: Self-pay

## 2018-11-14 NOTE — Telephone Encounter (Signed)
Copied from [place] [id number]. Topic: General - Other >> Nov 14, 2018  9:45 AM Keene Breath wrote: Reason for CRM: Patient called to discuss her new BP medication with the nurse or doctor.  Please advise and call patient back at [phone]

## 2018-11-14 NOTE — Telephone Encounter (Signed)
Patient called in stating she is returning a call back from office. Please advise.

## 2018-11-14 NOTE — Telephone Encounter (Signed)
Pt returned VM from [place] who was OOO. Please return call tomorrow.

## 2018-11-15 ENCOUNTER — Other Ambulatory Visit: Payer: Self-pay | Admitting: Family Medicine

## 2018-11-15 MED ORDER — HYDROCHLOROTHIAZIDE 12.5 MG PO TABS: 12.5000 mg | ORAL_TABLET | Freq: Every day | ORAL | 0 refills | Status: DC

## 2018-11-15 NOTE — Telephone Encounter (Signed)
Blood pressure has been elevated and her feet have been swollen. She denies chest pain, sob, palpitations and visual disturbances.  BP readings have been:  09/02: 137/67 78 am             160/71 75 pm  09/03: 135/73 82 am  164/75 76 pm   09/08: 164/78 84 am    09/10:  160/70 am  138/69 86: this was shortly after taking it the first time  169/76 86: pm  09/11:  143/76 87 am  09/14: 139/76 85 am  09/16: 155/68 77 pm  09/17: 150/73 85 am

## 2018-11-18 NOTE — Telephone Encounter (Signed)
lvm for scheduling °

## 2018-11-19 ENCOUNTER — Ambulatory Visit (INDEPENDENT_AMBULATORY_CARE_PROVIDER_SITE_OTHER): Payer: Medicare HMO

## 2018-11-19 ENCOUNTER — Other Ambulatory Visit: Payer: Self-pay

## 2018-11-19 ENCOUNTER — Other Ambulatory Visit: Payer: Self-pay | Admitting: Family Medicine

## 2018-11-19 VITALS — BP 150/70 | HR 75

## 2018-11-19 DIAGNOSIS — Z0130 Encounter for examination of blood pressure without abnormal findings: Secondary | ICD-10-CM

## 2018-11-19 DIAGNOSIS — Z23 Encounter for immunization: Secondary | ICD-10-CM

## 2018-11-19 MED ORDER — AMLODIPINE BESYLATE-VALSARTAN 10-320 MG PO TABS: 1.0000 | ORAL_TABLET | Freq: Every day | ORAL | 0 refills | Status: DC

## 2018-11-19 MED ORDER — AMLODIPINE BESYLATE 5 MG PO TABS: 5.0000 mg | ORAL_TABLET | Freq: Every day | ORAL | 0 refills | Status: DC

## 2018-11-19 NOTE — Progress Notes (Signed)
Patient is here for a blood pressure check. Patient denies chest pain, palpitations, shortness of breath or visual disturbances. Today during nurse visit first check blood pressure was 160 76 with a heart rate of 75. After resting for 10 minutes it was 152 68. Blood pressure was taken again and it was 150/70. She does take blood pressure medications as prescribed.   PCP increased dose of Amlodipine-Valsartan to 10-320 mg and asked her to follow up with her in 1 week.

## 2018-11-26 ENCOUNTER — Other Ambulatory Visit: Payer: Self-pay

## 2018-11-26 ENCOUNTER — Other Ambulatory Visit: Payer: Self-pay | Admitting: Family Medicine

## 2018-11-26 ENCOUNTER — Ambulatory Visit: Payer: Medicare HMO

## 2018-11-26 VITALS — BP 126/64

## 2018-11-26 DIAGNOSIS — I10 Essential (primary) hypertension: Secondary | ICD-10-CM

## 2018-11-26 NOTE — Progress Notes (Signed)
Patient here for Bp recheck.  At last visit her amlodipine/valsartan was increased to 10/320.  Pt denies any side effects.  Bp today is 126/64.

## 2018-12-06 ENCOUNTER — Other Ambulatory Visit: Payer: Self-pay

## 2018-12-06 DIAGNOSIS — D500 Iron deficiency anemia secondary to blood loss (chronic): Secondary | ICD-10-CM

## 2018-12-09 ENCOUNTER — Other Ambulatory Visit: Payer: Self-pay

## 2018-12-10 ENCOUNTER — Inpatient Hospital Stay: Payer: Medicare HMO | Admitting: Oncology

## 2018-12-10 ENCOUNTER — Other Ambulatory Visit: Payer: Self-pay

## 2018-12-10 ENCOUNTER — Inpatient Hospital Stay: Payer: Medicare HMO | Attending: Internal Medicine

## 2018-12-10 ENCOUNTER — Inpatient Hospital Stay: Payer: Medicare HMO

## 2018-12-10 DIAGNOSIS — Z79899 Other long term (current) drug therapy: Secondary | ICD-10-CM | POA: Insufficient documentation

## 2018-12-10 DIAGNOSIS — E611 Iron deficiency: Secondary | ICD-10-CM | POA: Insufficient documentation

## 2018-12-10 DIAGNOSIS — E538 Deficiency of other specified B group vitamins: Secondary | ICD-10-CM | POA: Diagnosis not present

## 2018-12-10 DIAGNOSIS — D500 Iron deficiency anemia secondary to blood loss (chronic): Secondary | ICD-10-CM

## 2018-12-10 DIAGNOSIS — I129 Hypertensive chronic kidney disease with stage 1 through stage 4 chronic kidney disease, or unspecified chronic kidney disease: Secondary | ICD-10-CM | POA: Insufficient documentation

## 2018-12-10 DIAGNOSIS — Z8249 Family history of ischemic heart disease and other diseases of the circulatory system: Secondary | ICD-10-CM | POA: Diagnosis not present

## 2018-12-10 DIAGNOSIS — D631 Anemia in chronic kidney disease: Secondary | ICD-10-CM

## 2018-12-10 DIAGNOSIS — N183 Chronic kidney disease, stage 3 unspecified: Secondary | ICD-10-CM | POA: Diagnosis not present

## 2018-12-10 DIAGNOSIS — Z833 Family history of diabetes mellitus: Secondary | ICD-10-CM | POA: Diagnosis not present

## 2018-12-10 DIAGNOSIS — E1122 Type 2 diabetes mellitus with diabetic chronic kidney disease: Secondary | ICD-10-CM | POA: Diagnosis not present

## 2018-12-10 DIAGNOSIS — Z7984 Long term (current) use of oral hypoglycemic drugs: Secondary | ICD-10-CM | POA: Insufficient documentation

## 2018-12-10 DIAGNOSIS — Z823 Family history of stroke: Secondary | ICD-10-CM | POA: Insufficient documentation

## 2018-12-10 LAB — CBC WITH DIFFERENTIAL/PLATELET
Abs Immature Granulocytes: 0 10*3/uL (ref 0.00–0.07)
Basophils Absolute: 0.1 10*3/uL (ref 0.0–0.1)
Basophils Relative: 1 %
Eosinophils Absolute: 0.1 10*3/uL (ref 0.0–0.5)
Eosinophils Relative: 3 %
HCT: 33.7 % — ABNORMAL LOW (ref 36.0–46.0)
Hemoglobin: 10.8 g/dL — ABNORMAL LOW (ref 12.0–15.0)
Immature Granulocytes: 0 %
Lymphocytes Relative: 48 %
Lymphs Abs: 2 10*3/uL (ref 0.7–4.0)
MCH: 29.1 pg (ref 26.0–34.0)
MCHC: 32 g/dL (ref 30.0–36.0)
MCV: 90.8 fL (ref 80.0–100.0)
Monocytes Absolute: 0.3 10*3/uL (ref 0.1–1.0)
Monocytes Relative: 6 %
Neutro Abs: 1.7 10*3/uL (ref 1.7–7.7)
Neutrophils Relative %: 42 %
Platelets: 204 10*3/uL (ref 150–400)
RBC: 3.71 MIL/uL — ABNORMAL LOW (ref 3.87–5.11)
RDW: 12.3 % (ref 11.5–15.5)
WBC: 4.1 10*3/uL (ref 4.0–10.5)
nRBC: 0 % (ref 0.0–0.2)

## 2018-12-10 LAB — RETIC PANEL
Immature Retic Fract: 2.7 % (ref 2.3–15.9)
RBC.: 3.71 MIL/uL — ABNORMAL LOW (ref 3.87–5.11)
Retic Count, Absolute: 43.4 10*3/uL (ref 19.0–186.0)
Retic Ct Pct: 1.2 % (ref 0.4–3.1)
Reticulocyte Hemoglobin: 34.2 pg (ref >27.9)

## 2018-12-10 LAB — IRON AND TIBC
Iron: 85 ug/dL (ref 28–170)
Saturation Ratios: 29 % (ref 10.4–31.8)
TIBC: 296 ug/dL (ref 250–450)
UIBC: 211 ug/dL

## 2018-12-10 LAB — FERRITIN: Ferritin: 176 ng/mL (ref 11–307)

## 2018-12-12 ENCOUNTER — Other Ambulatory Visit: Payer: Self-pay

## 2018-12-12 DIAGNOSIS — D500 Iron deficiency anemia secondary to blood loss (chronic): Secondary | ICD-10-CM

## 2018-12-13 ENCOUNTER — Inpatient Hospital Stay: Payer: Medicare HMO

## 2018-12-13 ENCOUNTER — Encounter: Payer: Self-pay | Admitting: Oncology

## 2018-12-13 ENCOUNTER — Inpatient Hospital Stay (HOSPITAL_BASED_OUTPATIENT_CLINIC_OR_DEPARTMENT_OTHER): Payer: Medicare HMO | Admitting: Oncology

## 2018-12-13 ENCOUNTER — Other Ambulatory Visit: Payer: Self-pay

## 2018-12-13 VITALS — BP 127/74 | HR 86 | Temp 98.9°F | Resp 18 | Wt 110.7 lb

## 2018-12-13 DIAGNOSIS — Z7984 Long term (current) use of oral hypoglycemic drugs: Secondary | ICD-10-CM | POA: Diagnosis not present

## 2018-12-13 DIAGNOSIS — N1831 Chronic kidney disease, stage 3a: Secondary | ICD-10-CM | POA: Diagnosis not present

## 2018-12-13 DIAGNOSIS — E1122 Type 2 diabetes mellitus with diabetic chronic kidney disease: Secondary | ICD-10-CM | POA: Diagnosis not present

## 2018-12-13 DIAGNOSIS — D500 Iron deficiency anemia secondary to blood loss (chronic): Secondary | ICD-10-CM | POA: Diagnosis not present

## 2018-12-13 DIAGNOSIS — E538 Deficiency of other specified B group vitamins: Secondary | ICD-10-CM | POA: Diagnosis not present

## 2018-12-13 DIAGNOSIS — I129 Hypertensive chronic kidney disease with stage 1 through stage 4 chronic kidney disease, or unspecified chronic kidney disease: Secondary | ICD-10-CM | POA: Diagnosis not present

## 2018-12-13 DIAGNOSIS — E611 Iron deficiency: Secondary | ICD-10-CM | POA: Diagnosis not present

## 2018-12-13 DIAGNOSIS — Z79899 Other long term (current) drug therapy: Secondary | ICD-10-CM | POA: Diagnosis not present

## 2018-12-13 DIAGNOSIS — D631 Anemia in chronic kidney disease: Secondary | ICD-10-CM | POA: Diagnosis not present

## 2018-12-13 DIAGNOSIS — N183 Chronic kidney disease, stage 3 unspecified: Secondary | ICD-10-CM | POA: Diagnosis not present

## 2018-12-13 DIAGNOSIS — Z8249 Family history of ischemic heart disease and other diseases of the circulatory system: Secondary | ICD-10-CM | POA: Diagnosis not present

## 2018-12-13 MED ORDER — FERROUS SULFATE 325 (65 FE) MG PO TBEC: 325.0000 mg | DELAYED_RELEASE_TABLET | Freq: Two times a day (BID) | ORAL | 2 refills | Status: DC

## 2018-12-13 NOTE — Progress Notes (Signed)
Patient here for follow up. No concerns voiced.  °

## 2018-12-13 NOTE — Progress Notes (Signed)
Hematology/Oncology follow up note [facility] [phone] [phone]   Patient Care Team: Steele Sizer, MD as PCP - General (Family Medicine) Yolonda Kida, MD as Consulting Physician (Cardiology) Earlie Server, MD as Consulting Physician (Oncology)  REFERRING PROVIDER: Steele Sizer, MD REASON FOR VISIT Follow up for treatment of anemia  HISTORY OF PRESENTING ILLNESS:  Emma Campbell is a  85 y.o.  female with PMH listed below who was referred to me for evaluation of anemia.  Patient follows up with primary care physician and has had labs done recently. 04/17/17 CBC showed hemoglobin 9, MCV 81.9, platelet 245,000, WBC 4.1, normal differential.  Iron panel showed TIBC 442, ferritin 11, saturation 15%. Previous GI workup:  # 01/23/2017 EGD showed non bleeding angiotecsia, treated with APC. 12/22/2016 Capsule study showed duodenum AVM.  11/06/2016 colonoscopy showed non bleeding hemorroids, sigmoid polyp, and diverticulosis.   INTERVAL HISTORY Emma Campbell is a 85 y.o. female who has above history reviewed by me today presents for follow up for management of iron deficiency anemia.  She reports feeling pretty well today.  Chronic fatigue has improved.  No new complaints. . Review of Systems  Constitutional: Positive for malaise/fatigue. Negative for chills, fever and weight loss.  HENT: Negative for sore throat.   Eyes: Negative for redness.  Respiratory: Negative for cough, shortness of breath and wheezing.   Cardiovascular: Negative for chest pain, palpitations and leg swelling.  Gastrointestinal: Negative for abdominal pain, blood in stool, nausea and vomiting.  Genitourinary: Negative for dysuria.  Musculoskeletal: Negative for myalgias.  Skin: Negative for rash.  Neurological: Negative for dizziness, tingling and tremors.  Endo/Heme/Allergies: Does not bruise/bleed easily.  Psychiatric/Behavioral: Negative for hallucinations.     MEDICAL HISTORY:  Past Medical History:  Diagnosis Date  . Anemia   . Chronic kidney disease, stage III (moderate)   . Diverticulosis   . Elevated LFTs   . Essential hypertension, malignant   . Fibrocystic breast disease   . Heart murmur   . Hyperlipidemia   . Menopausal problem   . Osteoarthrosis   . Osteoporosis   . Peripheral autonomic neuropathy   . Sick sinus syndrome (HCC)    Dr. Alfredo Batty  . Type II diabetes mellitus with renal manifestations ([place])   . Vitamin D deficiency     SURGICAL HISTORY: Past Surgical History:  Procedure Laterality Date  . COLONOSCOPY WITH PROPOFOL N/A 11/06/2016   Procedure: COLONOSCOPY WITH PROPOFOL;  Surgeon: Lucilla Lame, MD;  Location: [place];  Service: Endoscopy;  Laterality: N/A;  DIABETIC  . ESOPHAGOGASTRODUODENOSCOPY (EGD) WITH PROPOFOL N/A 11/06/2016   Procedure: ESOPHAGOGASTRODUODENOSCOPY (EGD) WITH PROPOFOL;  Surgeon: Lucilla Lame, MD;  Location: [place];  Service: Endoscopy;  Laterality: N/A;  . ESOPHAGOGASTRODUODENOSCOPY (EGD) WITH PROPOFOL N/A 01/23/2017   Procedure: ESOPHAGOGASTRODUODENOSCOPY (EGD) WITH PROPOFOL with PUSH;  Surgeon: Lucilla Lame, MD;  Location: [facility] ENDOSCOPY;  Service: Endoscopy;  Laterality: N/A;  . GIVENS CAPSULE STUDY N/A 12/22/2016   Procedure: GIVENS CAPSULE STUDY;  Surgeon: Lucilla Lame, MD;  Location: [facility] ENDOSCOPY;  Service: Endoscopy;  Laterality: N/A;  . PACEMAKER INSERTION  2006  . POLYPECTOMY  11/06/2016   Procedure: POLYPECTOMY;  Surgeon: Lucilla Lame, MD;  Location: [place];  Service: Endoscopy;;    SOCIAL HISTORY: Social History   Socioeconomic History  . Marital status: Widowed    Spouse name: Shanon Brow  . Number of children: 1  . Years of education: 2 years of college  . Highest education  level: 12th grade  Occupational History  . Occupation: Retired Agricultural engineer at [employer]  . Financial resource strain: Not hard at all  .  Food insecurity    Worry: Never true    Inability: Never true  . Transportation needs    Medical: No    Non-medical: No  Tobacco Use  . Smoking status: Never Smoker  . Smokeless tobacco: Never Used  . Tobacco comment: smoking cessation materials not required  Substance and Sexual Activity  . Alcohol use: No    Alcohol/week: 0.0 standard drinks  . Drug use: No  . Sexual activity: Never  Lifestyle  . Physical activity    Days per week: 7 days    Minutes per session: 30 min  . Stress: Not at all  Relationships  . Social connections    Talks on phone: More than three times a week    Gets together: More than three times a week    Attends religious service: More than 4 times per year    Active member of club or organization: Yes    Attends meetings of clubs or organizations: Never    Relationship status: Widowed  . Intimate partner violence    Fear of current or ex partner: No    Emotionally abused: No    Physically abused: No    Forced sexual activity: No  Other Topics Concern  . Not on file  Social History Narrative   She still has a house but her daughter and grand-daughter are staying at her house   She moved in with her older sister ( 21 years older), to help her out.     FAMILY HISTORY: Family History  Problem Relation Age of Onset  . Hypertension Mother   . Stroke Mother   . Diabetes Daughter   . Stroke Father   . Healthy Sister   . Hypertension Maternal Grandmother     ALLERGIES:  is allergic to ace inhibitors.  MEDICATIONS:  Current Outpatient Medications  Medication Sig Dispense Refill  . amiodarone (PACERONE) 200 MG tablet Take 100 mg by mouth daily.    Marland Kitchen amLODipine-valsartan (EXFORGE) 10-320 MG tablet Take 1 tablet by mouth daily. 90 tablet 0  . aspirin 81 MG tablet Take 81 mg by mouth daily.    Marland Kitchen atorvastatin (LIPITOR) 20 MG tablet TAKE 1 TABLET (20 MG TOTAL) BY MOUTH DAILY. 90 tablet 1  . cholecalciferol (VITAMIN D) 1000 UNITS tablet Take 200 Units  by mouth daily.    . ferrous sulfate 325 (65 FE) MG EC tablet Take 1 tablet (325 mg total) by mouth 2 (two) times daily with a meal. [id number] tablet 1  . folic acid (FOLVITE) A999333 MCG tablet Take 400 mcg by mouth daily.    . hydrochlorothiazide (HYDRODIURIL) 12.5 MG tablet Take 1 tablet (12.5 mg total) by mouth daily. 30 tablet 0  . metFORMIN (GLUCOPHAGE-XR) 750 MG 24 hr tablet TAKE 1 TABLET EVERY EVENING 90 tablet 1   Current Facility-Administered Medications  Medication Dose Route Frequency Provider Last Rate Last Dose  . cyanocobalamin ((VITAMIN B-12)) injection 1,000 mcg  1,000 mcg Intramuscular Q30 days Steele Sizer, MD   1,000 mcg at 07/08/18 [id number]   Facility-Administered Medications Ordered in Other Visits  Medication Dose Route Frequency Provider Last Rate Last Dose  . 0.9 %  sodium chloride infusion   Intravenous Continuous Earlie Server, MD 10 mL/hr at 06/19/18 1339       PHYSICAL EXAMINATION: ECOG PERFORMANCE STATUS:  1 - Symptomatic but completely ambulatory Vitals:   12/13/18 1432  BP: 127/74  Pulse: 86  Resp: 18  Temp: 98.9 F (37.2 C)   Filed Weights   12/13/18 1432  Weight: 110 lb 11.2 oz (50.2 kg)    Physical Exam  Constitutional: She is oriented to person, place, and time. No distress.  Thin. Walk in   HENT:  Head: Normocephalic and atraumatic.  Nose: Nose normal.  Mouth/Throat: Oropharynx is clear and moist. No oropharyngeal exudate.  Eyes: Pupils are equal, round, and reactive to light. EOM are normal. Left eye exhibits no discharge. No scleral icterus.  Neck: Normal range of motion. Neck supple. No JVD present.  Cardiovascular: Normal rate, regular rhythm and normal heart sounds. Exam reveals no friction rub.  No murmur heard. Pulmonary/Chest: Effort normal and breath sounds normal. No respiratory distress. She has no wheezes. She has no rales. She exhibits no tenderness.  Abdominal: Soft. Bowel sounds are normal. She exhibits no distension. There is no abdominal  tenderness.  Musculoskeletal: Normal range of motion.        General: No tenderness or edema.  Lymphadenopathy:    She has no cervical adenopathy.  Neurological: She is alert and oriented to person, place, and time. No cranial nerve deficit. She exhibits normal muscle tone. Coordination normal.  Skin: Skin is warm and dry. No rash noted. She is not diaphoretic. No erythema.  Psychiatric: Affect and judgment normal.     LABORATORY DATA:  I have reviewed the data as listed Lab Results  Component Value Date   WBC 4.1 12/10/2018   HGB 10.8 (L) 12/10/2018   HCT 33.7 (L) 12/10/2018   MCV 90.8 12/10/2018   PLT 204 12/10/2018   Recent Labs    12/17/17 1121 04/19/18 1044  NA 138 140  K 4.2 4.2  CL 100 103  CO2 27 26  GLUCOSE 99 105*  BUN 19 16  CREATININE 1.19* 1.03*  CALCIUM 9.6 9.3  GFRNONAA 42* 50*  GFRAA 49* 58*  PROT 7.2 6.9  AST 16 17  ALT 11 12  BILITOT 0.4 0.5    Iron/TIBC/Ferritin/ %Sat    Component Value Date/Time   IRON 85 12/10/2018 1332   TIBC 296 12/10/2018 1332   FERRITIN 176 12/10/2018 1332   IRONPCTSAT 29 12/10/2018 1332   IRONPCTSAT 15 04/11/2016 1103   SPEP not observed M spike.   ASSESSMENT & PLAN:  1. Iron deficiency anemia due to chronic blood loss   2. Anemia of chronic renal failure, stage 3a    # Iron deficiency anemia, Labs are reviewed and discussed with patient. Hemoglobin is stable, improved, 10.8.  Iron panel shows stable and adequate iron store.  Hold IV iron today.  Continue oral iron supplementation   # Anemia of CKD, since hemoglobin is >10, no need for erythropoietin treatments.  We will recheck iron TIBC ferritin CBC in 6 months. Possible retacrit if Hb<10   Orders Placed This Encounter  Procedures  . CBC with Differential/Platelet    Standing Status:   Future    Standing Expiration Date:   12/13/2019  . Ferritin    Standing Status:   Future    Standing Expiration Date:   12/13/2019  . Iron and TIBC    Standing  Status:   Future    Standing Expiration Date:   12/13/2019   All questions were answered. The patient knows to call the clinic with any problems questions or concerns.  Return of visit: 3  months.  Total face to face encounter time for this patient visit was 15 min. >50% of the time was  spent in counseling and coordination of care.    Earlie Server, MD, PhD Hematology Oncology [facility] at [facility] Pager- [phone] 12/13/2018

## 2018-12-25 DIAGNOSIS — H2513 Age-related nuclear cataract, bilateral: Secondary | ICD-10-CM | POA: Diagnosis not present

## 2018-12-25 LAB — HM DIABETES EYE EXAM

## 2018-12-30 ENCOUNTER — Telehealth: Payer: Self-pay | Admitting: *Deleted

## 2018-12-30 NOTE — Telephone Encounter (Signed)
Ok. She can choose not to pick them up. I think it is the auto refill requests from pharmacy.

## 2018-12-30 NOTE — Telephone Encounter (Signed)
Patient called and asked that we stop sending prescription to [facility] for her iron tabs and she cannot afford them as she can get them over the counter a lot cheaper with the same dose.

## 2018-12-31 ENCOUNTER — Other Ambulatory Visit: Payer: Self-pay

## 2018-12-31 ENCOUNTER — Encounter: Payer: Self-pay | Admitting: *Deleted

## 2018-12-31 DIAGNOSIS — E1159 Type 2 diabetes mellitus with other circulatory complications: Secondary | ICD-10-CM | POA: Diagnosis not present

## 2018-12-31 DIAGNOSIS — H2511 Age-related nuclear cataract, right eye: Secondary | ICD-10-CM | POA: Diagnosis not present

## 2019-01-01 ENCOUNTER — Ambulatory Visit: Payer: Medicare HMO | Admitting: Family Medicine

## 2019-01-01 NOTE — Anesthesia Preprocedure Evaluation (Addendum)
Anesthesia Evaluation  Patient identified by MRN, date of birth, ID band Patient awake    Reviewed: NPO status   History of Anesthesia Complications Negative for: history of anesthetic complications  Airway Mallampati: II  TM Distance: >3 FB Neck ROM: full    Dental  (+) Chipped, Missing, Poor Dentition   Pulmonary neg pulmonary ROS,    Pulmonary exam normal        Cardiovascular Exercise Tolerance: Good hypertension, + dysrhythmias (parox. afib on amiodarone) Atrial Fibrillation + pacemaker (SSS)   sick sinus syndrome bradycardia has history of permanent pacemaker recent pacer check was okay   atrial fibrillation paroxysmal   murmur chronic stable  cards stable: 01/2018: dr. Clayborn Bigness;      Neuro/Psych  Neuromuscular disease (peripheral neuropathy) negative psych ROS   GI/Hepatic Neg liver ROS, GERD  Controlled,  Endo/Other  diabetes, Type 2  Renal/GU Renal disease (stage III CKD)  negative genitourinary   Musculoskeletal  (+) Arthritis ,   Abdominal   Peds  Hematology  (+) Blood dyscrasia, anemia ,   Anesthesia Other Findings Covid: NEG.  Pacemaker check 09/04/18   Cardiology note 02/05/18:  Plan  1 sick sinus syndrome bradycardia has history of permanent pacemaker recent pacer check was okay here for routine follow-up 2 atrial fibrillation paroxysmal on amiodarone for rhythm not on anticoagulation has appeared to maintain sinus rhythm continuously for years 3 diabetes type 2 uncomplicated hemoglobin Z6X of 6.1 currently on metformin 4 hypertension reasonably controlled continue amlodipine valsartan HCTZ 5 hyperlipidemia chronic stable continue Crestor therapy for lipid management 6 murmur chronic stable continue repeat echocardiogram for further assessment evaluation 7 bradycardia underlying chronic currently asymptomatic with permanent pacemaker placed 8 have the patient follow-up in 6  months  Return in about 6 months (around 08/07/2018).  DWAYNE Prince Rome, MD   Reproductive/Obstetrics                           Anesthesia Physical Anesthesia Plan  ASA: III  Anesthesia Plan: MAC   Post-op Pain Management:    Induction:   PONV Risk Score and Plan: 2 and Midazolam and TIVA  Airway Management Planned:   Additional Equipment:   Intra-op Plan:   Post-operative Plan:   Informed Consent: I have reviewed the patients History and Physical, chart, labs and discussed the procedure including the risks, benefits and alternatives for the proposed anesthesia with the patient or authorized representative who has indicated his/her understanding and acceptance.       Plan Discussed with: CRNA  Anesthesia Plan Comments:         Anesthesia Quick Evaluation

## 2019-01-03 ENCOUNTER — Other Ambulatory Visit: Payer: Self-pay | Admitting: *Deleted

## 2019-01-03 ENCOUNTER — Other Ambulatory Visit: Payer: Self-pay

## 2019-01-03 ENCOUNTER — Other Ambulatory Visit
Admission: RE | Admit: 2019-01-03 | Discharge: 2019-01-03 | Disposition: A | Discharge status: Home / Self Care | Payer: Medicare HMO | Source: Ambulatory Visit | Attending: Ophthalmology | Admitting: Ophthalmology

## 2019-01-03 DIAGNOSIS — Z20828 Contact with and (suspected) exposure to other viral communicable diseases: Secondary | ICD-10-CM | POA: Diagnosis not present

## 2019-01-03 DIAGNOSIS — Z01812 Encounter for preprocedural laboratory examination: Secondary | ICD-10-CM | POA: Insufficient documentation

## 2019-01-03 DIAGNOSIS — Z20822 Contact with and (suspected) exposure to covid-19: Secondary | ICD-10-CM

## 2019-01-03 LAB — SARS CORONAVIRUS 2 (TAT 6-24 HRS): SARS Coronavirus 2: NEGATIVE

## 2019-01-03 NOTE — Discharge Instructions (Signed)

## 2019-01-07 ENCOUNTER — Ambulatory Visit: Payer: Medicare HMO | Admitting: Anesthesiology

## 2019-01-07 ENCOUNTER — Other Ambulatory Visit: Payer: Self-pay

## 2019-01-07 ENCOUNTER — Encounter
Admission: RE | Disposition: A | Discharge status: Home / Self Care | Payer: Self-pay | Source: Home / Self Care | Attending: Ophthalmology

## 2019-01-07 ENCOUNTER — Ambulatory Visit
Admission: RE | Admit: 2019-01-07 | Discharge: 2019-01-07 | Disposition: A | Discharge status: Home / Self Care | Payer: Medicare HMO | Attending: Ophthalmology | Admitting: Ophthalmology

## 2019-01-07 DIAGNOSIS — Z950 Presence of cardiac pacemaker: Secondary | ICD-10-CM | POA: Diagnosis not present

## 2019-01-07 DIAGNOSIS — Z7982 Long term (current) use of aspirin: Secondary | ICD-10-CM | POA: Insufficient documentation

## 2019-01-07 DIAGNOSIS — I480 Paroxysmal atrial fibrillation: Secondary | ICD-10-CM | POA: Diagnosis not present

## 2019-01-07 DIAGNOSIS — E1142 Type 2 diabetes mellitus with diabetic polyneuropathy: Secondary | ICD-10-CM | POA: Diagnosis not present

## 2019-01-07 DIAGNOSIS — Z7984 Long term (current) use of oral hypoglycemic drugs: Secondary | ICD-10-CM | POA: Insufficient documentation

## 2019-01-07 DIAGNOSIS — H2511 Age-related nuclear cataract, right eye: Secondary | ICD-10-CM | POA: Diagnosis not present

## 2019-01-07 DIAGNOSIS — E1136 Type 2 diabetes mellitus with diabetic cataract: Secondary | ICD-10-CM | POA: Diagnosis not present

## 2019-01-07 DIAGNOSIS — H25811 Combined forms of age-related cataract, right eye: Secondary | ICD-10-CM | POA: Diagnosis not present

## 2019-01-07 DIAGNOSIS — E1122 Type 2 diabetes mellitus with diabetic chronic kidney disease: Secondary | ICD-10-CM | POA: Insufficient documentation

## 2019-01-07 DIAGNOSIS — N183 Chronic kidney disease, stage 3 unspecified: Secondary | ICD-10-CM | POA: Diagnosis not present

## 2019-01-07 DIAGNOSIS — Z79899 Other long term (current) drug therapy: Secondary | ICD-10-CM | POA: Insufficient documentation

## 2019-01-07 DIAGNOSIS — I129 Hypertensive chronic kidney disease with stage 1 through stage 4 chronic kidney disease, or unspecified chronic kidney disease: Secondary | ICD-10-CM | POA: Diagnosis not present

## 2019-01-07 DIAGNOSIS — E7800 Pure hypercholesterolemia, unspecified: Secondary | ICD-10-CM | POA: Diagnosis not present

## 2019-01-07 DIAGNOSIS — M1990 Unspecified osteoarthritis, unspecified site: Secondary | ICD-10-CM | POA: Insufficient documentation

## 2019-01-07 DIAGNOSIS — I495 Sick sinus syndrome: Secondary | ICD-10-CM | POA: Diagnosis not present

## 2019-01-07 HISTORY — PX: CATARACT EXTRACTION W/PHACO: SHX586

## 2019-01-07 HISTORY — DX: Presence of cardiac pacemaker: Z95.0

## 2019-01-07 LAB — GLUCOSE, CAPILLARY
Glucose-Capillary: 101 mg/dL — ABNORMAL HIGH (ref 70–99)
Glucose-Capillary: 104 mg/dL — ABNORMAL HIGH (ref 70–99)

## 2019-01-07 SURGERY — PHACOEMULSIFICATION, CATARACT, WITH IOL INSERTION
Anesthesia: Monitor Anesthesia Care | Site: Eye | Laterality: Right

## 2019-01-07 MED ORDER — OXYCODONE HCL 5 MG PO TABS: 5.0000 mg | ORAL_TABLET | Freq: Once | ORAL | Status: DC | PRN

## 2019-01-07 MED ORDER — ARMC OPHTHALMIC DILATING DROPS
1.0000 "application " | OPHTHALMIC | Status: DC | PRN
  Administered 2019-01-07 (×3): 1 via OPHTHALMIC

## 2019-01-07 MED ORDER — OXYCODONE HCL 5 MG/5ML PO SOLN: 5.0000 mg | Freq: Once | ORAL | Status: DC | PRN

## 2019-01-07 MED ORDER — MOXIFLOXACIN HCL 0.5 % OP SOLN
OPHTHALMIC | Status: DC | PRN
  Administered 2019-01-07: 0.2 mL via OPHTHALMIC

## 2019-01-07 MED ORDER — EPINEPHRINE PF 1 MG/ML IJ SOLN
INTRAOCULAR | Status: DC | PRN
  Administered 2019-01-07: 99 mL via OPHTHALMIC

## 2019-01-07 MED ORDER — MIDAZOLAM HCL 2 MG/2ML IJ SOLN
INTRAMUSCULAR | Status: DC | PRN
  Administered 2019-01-07: 1 mg via INTRAVENOUS

## 2019-01-07 MED ORDER — NA CHONDROIT SULF-NA HYALURON 40-17 MG/ML IO SOLN
INTRAOCULAR | Status: DC | PRN
  Administered 2019-01-07: 1 mL via INTRAOCULAR

## 2019-01-07 MED ORDER — FENTANYL CITRATE (PF) 100 MCG/2ML IJ SOLN
INTRAMUSCULAR | Status: DC | PRN
  Administered 2019-01-07: 50 ug via INTRAVENOUS

## 2019-01-07 MED ORDER — TETRACAINE HCL 0.5 % OP SOLN
1.0000 [drp] | OPHTHALMIC | Status: DC | PRN
  Administered 2019-01-07 (×3): 1 [drp] via OPHTHALMIC

## 2019-01-07 MED ORDER — BRIMONIDINE TARTRATE-TIMOLOL 0.2-0.5 % OP SOLN
OPHTHALMIC | Status: DC | PRN
  Administered 2019-01-07: 1 [drp] via OPHTHALMIC

## 2019-01-07 MED ORDER — LIDOCAINE HCL (PF) 2 % IJ SOLN
INTRAOCULAR | Status: DC | PRN
  Administered 2019-01-07: 2 mL

## 2019-01-07 SURGICAL SUPPLY — 20 items
CANNULA ANT/CHMB 27G (MISCELLANEOUS) ×2 IMPLANT
CANNULA ANT/CHMB 27GA (MISCELLANEOUS) ×6 IMPLANT
GLOVE SURG LX 8.0 MICRO (GLOVE) ×2
GLOVE SURG LX STRL 8.0 MICRO (GLOVE) ×1 IMPLANT
GLOVE SURG TRIUMPH 8.0 PF LTX (GLOVE) ×3 IMPLANT
GOWN STRL REUS W/ TWL LRG LVL3 (GOWN DISPOSABLE) ×2 IMPLANT
GOWN STRL REUS W/TWL LRG LVL3 (GOWN DISPOSABLE) ×4
LENS IOL TECNIS ITEC 22.5 (Intraocular Lens) ×2 IMPLANT
MARKER SKIN DUAL TIP RULER LAB (MISCELLANEOUS) ×3 IMPLANT
NDL FILTER BLUNT 18X1 1/2 (NEEDLE) ×1 IMPLANT
NDL RETROBULBAR .5 NSTRL (NEEDLE) ×3 IMPLANT
NEEDLE FILTER BLUNT 18X 1/2SAF (NEEDLE) ×2
NEEDLE FILTER BLUNT 18X1 1/2 (NEEDLE) ×1 IMPLANT
PACK EYE AFTER SURG (MISCELLANEOUS) ×3 IMPLANT
PACK OPTHALMIC (MISCELLANEOUS) ×3 IMPLANT
PACK PORFILIO (MISCELLANEOUS) ×3 IMPLANT
SYR 3ML LL SCALE MARK (SYRINGE) ×3 IMPLANT
SYR TB 1ML LUER SLIP (SYRINGE) ×3 IMPLANT
WATER STERILE IRR 250ML POUR (IV SOLUTION) ×3 IMPLANT
WIPE NON LINTING 3.25X3.25 (MISCELLANEOUS) ×3 IMPLANT

## 2019-01-07 NOTE — Op Note (Signed)
PREOPERATIVE DIAGNOSIS:  Nuclear sclerotic cataract of the right eye.   POSTOPERATIVE DIAGNOSIS:  H25.11 Cataract   OPERATIVE PROCEDURE:@   SURGEON:  Birder Robson, MD.   ANESTHESIA:  Anesthesiologist: Fidel Levy, MD CRNA: Cameron Ali, CRNA  1.      Managed anesthesia care. 2.      0.73ml of Shugarcaine was instilled in the eye following the paracentesis.   COMPLICATIONS:  None.   TECHNIQUE:   Stop and chop   DESCRIPTION OF PROCEDURE:  The patient was examined and consented in the preoperative holding area where the aforementioned topical anesthesia was applied to the right eye and then brought back to the Operating Room where the right eye was prepped and draped in the usual sterile ophthalmic fashion and a lid speculum was placed. A paracentesis was created with the side port blade and the anterior chamber was filled with viscoelastic. A near clear corneal incision was performed with the steel keratome. A continuous curvilinear capsulorrhexis was performed with a cystotome followed by the capsulorrhexis forceps. Hydrodissection and hydrodelineation were carried out with BSS on a blunt cannula. The lens was removed in a stop and chop  technique and the remaining cortical material was removed with the irrigation-aspiration handpiece. The capsular bag was inflated with viscoelastic and the Technis [id number]  lens was placed in the capsular bag without complication. The remaining viscoelastic was removed from the eye with the irrigation-aspiration handpiece. The wounds were hydrated. The anterior chamber was flushed with BSS and the eye was inflated to physiologic pressure. 0.61ml of Vigamox was placed in the anterior chamber. The wounds were found to be water tight. The eye was dressed with Combigan. The patient was given protective glasses to wear throughout the day and a shield with which to sleep tonight. The patient was also given drops with which to begin a drop regimen today and will  follow-up with me in one day. Implant Name Type Inv. Item Serial No. Manufacturer Lot No. LRB No. Used Action  LENS IOL DIOP 22.5 - [id number] Intraocular Lens LENS IOL DIOP 22.5 [id number]  Right 1 Implanted   Procedure(s) with comments: CATARACT EXTRACTION PHACO AND INTRAOCULAR LENS PLACEMENT (IOC) RIGHT DIABETIC 02:15.1          25.7%        34.69 (Right) - Diabetic - oral meds  Electronically signed: Birder Robson 01/07/2019 9:12 AM

## 2019-01-07 NOTE — Transfer of Care (Signed)
Immediate Anesthesia Transfer of Care Note  Patient: Emma Campbell  Procedure(s) Performed: CATARACT EXTRACTION PHACO AND INTRAOCULAR LENS PLACEMENT (IOC) RIGHT DIABETIC 02:15.1          25.7%        34.69 (Right Eye)  Patient Location: PACU  Anesthesia Type: MAC  Level of Consciousness: awake, alert  and patient cooperative  Airway and Oxygen Therapy: Patient Spontanous Breathing and Patient connected to supplemental oxygen  Post-op Assessment: Post-op Vital signs reviewed, Patient's Cardiovascular Status Stable, Respiratory Function Stable, Patent Airway and No signs of Nausea or vomiting  Post-op Vital Signs: Reviewed and stable  Complications: No apparent anesthesia complications

## 2019-01-07 NOTE — Anesthesia Postprocedure Evaluation (Signed)
Anesthesia Post Note  Patient: Emma Campbell  Procedure(s) Performed: CATARACT EXTRACTION PHACO AND INTRAOCULAR LENS PLACEMENT (IOC) RIGHT DIABETIC 02:15.1          25.7%        34.69 (Right Eye)     Patient location during evaluation: PACU Anesthesia Type: MAC Level of consciousness: awake and alert Pain management: pain level controlled Vital Signs Assessment: post-procedure vital signs reviewed and stable Respiratory status: spontaneous breathing, nonlabored ventilation, respiratory function stable and patient connected to nasal cannula oxygen Cardiovascular status: stable and blood pressure returned to baseline Postop Assessment: no apparent nausea or vomiting Anesthetic complications: no    Lakethia Coppess

## 2019-01-07 NOTE — H&P (Signed)
All labs reviewed. Abnormal studies sent to patients PCP when indicated.  Previous H&P reviewed, patient examined, there are NO CHANGES.  Emma Honea Porfilio11/10/20208:40 AM

## 2019-01-07 NOTE — Anesthesia Procedure Notes (Signed)
Procedure Name: MAC Date/Time: 01/07/2019 8:49 AM Performed by: Cameron Ali, CRNA Pre-anesthesia Checklist: Patient identified, Emergency Drugs available, Suction available, Timeout performed and Patient being monitored Patient Re-evaluated:Patient Re-evaluated prior to induction Oxygen Delivery Method: Nasal cannula Placement Confirmation: positive ETCO2

## 2019-01-08 ENCOUNTER — Encounter: Payer: Self-pay | Admitting: Family Medicine

## 2019-01-08 ENCOUNTER — Ambulatory Visit (INDEPENDENT_AMBULATORY_CARE_PROVIDER_SITE_OTHER): Payer: Medicare HMO | Admitting: Family Medicine

## 2019-01-08 VITALS — BP 120/70 | HR 85 | Temp 96.9°F | Resp 16 | Ht 61.0 in | Wt 108.6 lb

## 2019-01-08 DIAGNOSIS — D500 Iron deficiency anemia secondary to blood loss (chronic): Secondary | ICD-10-CM | POA: Diagnosis not present

## 2019-01-08 DIAGNOSIS — I495 Sick sinus syndrome: Secondary | ICD-10-CM

## 2019-01-08 DIAGNOSIS — Z950 Presence of cardiac pacemaker: Secondary | ICD-10-CM | POA: Diagnosis not present

## 2019-01-08 DIAGNOSIS — E7800 Pure hypercholesterolemia, unspecified: Secondary | ICD-10-CM | POA: Diagnosis not present

## 2019-01-08 DIAGNOSIS — E441 Mild protein-calorie malnutrition: Secondary | ICD-10-CM

## 2019-01-08 DIAGNOSIS — N183 Chronic kidney disease, stage 3 unspecified: Secondary | ICD-10-CM

## 2019-01-08 DIAGNOSIS — I10 Essential (primary) hypertension: Secondary | ICD-10-CM

## 2019-01-08 DIAGNOSIS — I480 Paroxysmal atrial fibrillation: Secondary | ICD-10-CM

## 2019-01-08 DIAGNOSIS — E1122 Type 2 diabetes mellitus with diabetic chronic kidney disease: Secondary | ICD-10-CM | POA: Diagnosis not present

## 2019-01-08 LAB — POCT UA - MICROALBUMIN: Microalbumin Ur, POC: 20 mg/L

## 2019-01-08 LAB — POCT GLYCOSYLATED HEMOGLOBIN (HGB A1C): Hemoglobin A1C: 5.9 % — AB (ref 4.0–5.6)

## 2019-01-08 NOTE — Progress Notes (Signed)
Name: Emma Campbell   MRN: [id number]    DOB: 02-06-1933   Date:01/08/2019       Progress Note  Subjective  Chief Complaint  Chief Complaint  Patient presents with  . Medication Refill  . Diabetes  . Hypertension  . Hyperlipidemia    HPI  DMII with renal manifestation: CKI and microalbuminuria. HgbA1C was 6.4% 03/2018 and today is down to 5.9%She denies hypoglycemia episodes. She feels well. Glucose at home is getting checked a couple of times weekly , values between 108- highest 131   She denies polyphagia, polydipsia or polyuria ( except right after she takes HCTZ) Continue ARB for kidney protection.Eye exam is up to date, recheck labs yearly   HTN: taking medicationdaily and bp is at goal, no dizziness, chest pain or palpitation.BP has been in the 120's/70's range. Taking hctz 12.5 mg.   Hyperlipidemia:she has been taking Rosuvastatin daily and denies side effects at this time  Sick Sinus Syndrome: she had a pacemaker placed in 2006and has been onAmiodarone since 2006, she is now down to 100 mg of Amiodarone daily, last TSH was back to normal.No chest pain or palpitation.Sees Dr. Clayborn Bigness, last visit12/2019, she is [name] score of 2, and is on aspirin onlyper his recommendation.She is at a high risk of falls because of age and does not want anti-coagulant. She also has a history of Afib.last visit with him was in 2019, went back in July 2020 but it was just for a pacemaker check   Vitamin D deficiency: still taking supplementation, denies fatigue,last Vitamin D was at goal recheck next year   Osteopenia: last FRAX showed osteopenia, not taking Alendronate, we will recheck bone density test next year   Anemia: iron deficiency and positive hemoccult, seen by Dr. Durwin Reges andhadEGD and colonoscopy September 2018,diagnosed with AVM small bowel and had it cauterized,hemoglobin dropped and was referred to Dr. Tasia Catchings, labs are being monitored by her, last HCTwas in the 10's.  She had labs done about one month ago and did not need another infusion   B12 deficiency: Dr. Tasia Catchings advised her stop supplementation  Malnourished: explained that she lost 16 lbs in the past year, discussed giving her medication to increase appetite, but she wants to hold off for now, she does will try adding Glucerna shakes    Patient Active Problem List   Diagnosis Date Noted  . B12 deficiency 05/21/2017  . Gastrointestinal tract imaging abnormality   . Iron deficiency anemia   . Polyp of sigmoid colon   . Type 2 diabetes mellitus with stage 2 chronic kidney disease, without long-term current use of insulin ([place]) 08/03/2015  . Arthritis, degenerative 05/03/2015  . Menopausal and perimenopausal disorder 05/03/2015  . Chronic kidney disease (CKD), stage III (moderate) 11/30/2014  . High risk medication use 11/30/2014  . Diverticulosis 11/30/2014  . Fibrocystic breast disease 11/30/2014  . Elevated LFTs 11/30/2014  . Systolic ejection murmur 68/34/1962  . Paroxysmal A-fib ([place]) 11/30/2014  . Microalbuminuria 07/31/2014  . Elevated TSH 07/31/2014  . History of cardiac pacemaker 07/31/2014  . Sick sinus syndrome ([place]) 08/13/2013  . Hypercholesteremia 08/13/2013  . Hypertension, benign 08/13/2013  . Diabetes mellitus with renal manifestations, controlled ([place]) 08/13/2013  . Avitaminosis D 04/12/2009    Past Surgical History:  Procedure Laterality Date  . COLONOSCOPY WITH PROPOFOL N/A 11/06/2016   Procedure: COLONOSCOPY WITH PROPOFOL;  Surgeon: Lucilla Lame, MD;  Location: [facility];  Service: Endoscopy;  Laterality: N/A;  DIABETIC  . ESOPHAGOGASTRODUODENOSCOPY (EGD)  WITH PROPOFOL N/A 11/06/2016   Procedure: ESOPHAGOGASTRODUODENOSCOPY (EGD) WITH PROPOFOL;  Surgeon: Lucilla Lame, MD;  Location: [facility];  Service: Endoscopy;  Laterality: N/A;  . ESOPHAGOGASTRODUODENOSCOPY (EGD) WITH PROPOFOL N/A 01/23/2017   Procedure: ESOPHAGOGASTRODUODENOSCOPY (EGD) WITH PROPOFOL  with PUSH;  Surgeon: Lucilla Lame, MD;  Location: [facility] ENDOSCOPY;  Service: Endoscopy;  Laterality: N/A;  . GIVENS CAPSULE STUDY N/A 12/22/2016   Procedure: GIVENS CAPSULE STUDY;  Surgeon: Lucilla Lame, MD;  Location: [facility] ENDOSCOPY;  Service: Endoscopy;  Laterality: N/A;  . INSERT / REPLACE / REMOVE PACEMAKER    . PACEMAKER INSERTION  2006  . POLYPECTOMY  11/06/2016   Procedure: POLYPECTOMY;  Surgeon: Lucilla Lame, MD;  Location: [facility];  Service: Endoscopy;;    Family History  Problem Relation Age of Onset  . Hypertension Mother   . Stroke Mother   . Diabetes Daughter   . Stroke Father   . Healthy Sister   . Hypertension Maternal Grandmother     Social History   Socioeconomic History  . Marital status: Widowed    Spouse name: Shanon Brow  . Number of children: 1  . Years of education: 2 years of college  . Highest education level: 12th grade  Occupational History  . Occupation: Retired Agricultural engineer at [employer]  . Financial resource strain: Not hard at all  . Food insecurity    Worry: Never true    Inability: Never true  . Transportation needs    Medical: No    Non-medical: No  Tobacco Use  . Smoking status: Never Smoker  . Smokeless tobacco: Never Used  . Tobacco comment: smoking cessation materials not required  Substance and Sexual Activity  . Alcohol use: No    Alcohol/week: 0.0 standard drinks  . Drug use: No  . Sexual activity: Never  Lifestyle  . Physical activity    Days per week: 7 days    Minutes per session: 30 min  . Stress: Not at all  Relationships  . Social connections    Talks on phone: More than three times a week    Gets together: More than three times a week    Attends religious service: More than 4 times per year    Active member of club or organization: Yes    Attends meetings of clubs or organizations: Never    Relationship status: Widowed  . Intimate partner violence    Fear of current or ex partner: No     Emotionally abused: No    Physically abused: No    Forced sexual activity: No  Other Topics Concern  . Not on file  Social History Narrative   She still has a house but her daughter and grand-daughter are staying at her house   She moved in with her older sister ( 42 years older), to help her out.      Current Outpatient Medications:  .  amiodarone (PACERONE) 200 MG tablet, Take 100 mg by mouth daily., Disp: , Rfl:  .  amLODipine-valsartan (EXFORGE) 10-320 MG tablet, Take 1 tablet by mouth daily., Disp: 90 tablet, Rfl: 0 .  aspirin 81 MG tablet, Take 81 mg by mouth daily., Disp: , Rfl:  .  atorvastatin (LIPITOR) 20 MG tablet, TAKE 1 TABLET (20 MG TOTAL) BY MOUTH DAILY., Disp: 90 tablet, Rfl: 1 .  cholecalciferol (VITAMIN D) 1000 UNITS tablet, Take 200 Units by mouth daily., Disp: , Rfl:  .  DUREZOL 0.05 % EMUL, Apply 1 drop  to eye 2 (two) times daily., Disp: , Rfl:  .  ferrous sulfate 325 (65 FE) MG EC tablet, Take 1 tablet (325 mg total) by mouth 2 (two) times daily with a meal., Disp: 180 tablet, Rfl: 2 .  folic acid (FOLVITE) 384 MCG tablet, Take 400 mcg by mouth daily., Disp: , Rfl:  .  hydrochlorothiazide (HYDRODIURIL) 12.5 MG tablet, Take 1 tablet (12.5 mg total) by mouth daily., Disp: 30 tablet, Rfl: 0 .  ILEVRO 0.3 % ophthalmic suspension, 1 drop 2 (two) times daily., Disp: , Rfl:  .  metFORMIN (GLUCOPHAGE-XR) 750 MG 24 hr tablet, TAKE 1 TABLET EVERY EVENING, Disp: 90 tablet, Rfl: 1  Current Facility-Administered Medications:  .  cyanocobalamin ((VITAMIN B-12)) injection 1,000 mcg, 1,000 mcg, Intramuscular, Q30 days, Steele Sizer, MD, 1,000 mcg at 07/08/18 6659  Facility-Administered Medications Ordered in Other Visits:  .  0.9 %  sodium chloride infusion, , Intravenous, Continuous, Earlie Server, MD, Last Rate: 10 mL/hr at 06/19/18 1339  Allergies  Allergen Reactions  . Ace Inhibitors Other (See Comments)    Pt unable to recall. Was advised to avoid    I personally reviewed  active problem list, medication list, allergies, family history, social history with the patient/caregiver today.   ROS  Constitutional: Negative for fever or weight change.  Respiratory: Negative for cough and shortness of breath.   Cardiovascular: Negative for chest pain or palpitations.  Gastrointestinal: Negative for abdominal pain, no bowel changes.  Musculoskeletal: Negative for gait problem or joint swelling.  Skin: Negative for rash.  Neurological: Negative for dizziness or headache.  No other specific complaints in a complete review of systems (except as listed in HPI above).  Objective  Vitals:   01/08/19 1155  BP: 120/70  Pulse: 85  Resp: 16  Temp: (!) 96.9 F (36.1 C)  TempSrc: Temporal  SpO2: 96%  Weight: 108 lb 9.6 oz (49.3 kg)  Height: 5' 1"  (1.549 m)    Body mass index is 20.52 kg/m.  Physical Exam  Constitutional: Patient appears well-developed but malnourished .  Temporal waisting No distress.  HEENT: head atraumatic, normocephalic, pupils equal and reactive to light Cardiovascular: Normal rate, regular rhythm and normal heart sounds.  No murmur heard. No BLE edema. Pulmonary/Chest: Effort normal and breath sounds normal. No respiratory distress. Abdominal: Soft.  There is no tenderness. Psychiatric: Patient has a normal mood and affect. behavior is normal. Judgment and thought content normal.   Recent Results (from the past 2160 hour(s))  Iron and TIBC     Status: None   Collection Time: 12/10/18  1:32 PM  Result Value Ref Range   Iron 85 28 - 170 ug/dL   TIBC 296 250 - 450 ug/dL   Saturation Ratios 29 10.4 - 31.8 %   UIBC 211 ug/dL    Comment: Performed at [facility], [address] [place], [place] [address]  Ferritin     Status: None   Collection Time: 12/10/18  1:32 PM  Result Value Ref Range   Ferritin 176 11 - 307 ng/mL    Comment: Performed at [facility], [address] [place], [place] [address]  Retic Panel      Status: Abnormal   Collection Time: 12/10/18  1:32 PM  Result Value Ref Range   Retic Ct Pct 1.2 0.4 - 3.1 %   RBC. 3.71 (L) 3.87 - 5.11 MIL/uL   Retic Count, Absolute 43.4 19.0 - 186.0 K/uL   Immature Retic Fract 2.7  2.3 - 15.9 %   Reticulocyte Hemoglobin 34.2 >27.9 pg    Comment:        Given the high negative predictive value of a RET-He result > 32 pg iron deficiency is essentially excluded. If this patient is anemic other etiologies should be considered. Performed at [facility], [address] [place], [place] [address]   CBC with Differential/Platelet     Status: Abnormal   Collection Time: 12/10/18  1:32 PM  Result Value Ref Range   WBC 4.1 4.0 - 10.5 K/uL   RBC 3.71 (L) 3.87 - 5.11 MIL/uL   Hemoglobin 10.8 (L) 12.0 - 15.0 g/dL   HCT 33.7 (L) 36.0 - 46.0 %   MCV 90.8 80.0 - 100.0 fL   MCH 29.1 26.0 - 34.0 pg   MCHC 32.0 30.0 - 36.0 g/dL   RDW 12.3 11.5 - 15.5 %   Platelets 204 150 - 400 K/uL   nRBC 0.0 0.0 - 0.2 %   Neutrophils Relative % 42 %   Neutro Abs 1.7 1.7 - 7.7 K/uL   Lymphocytes Relative 48 %   Lymphs Abs 2.0 0.7 - 4.0 K/uL   Monocytes Relative 6 %   Monocytes Absolute 0.3 0.1 - 1.0 K/uL   Eosinophils Relative 3 %   Eosinophils Absolute 0.1 0.0 - 0.5 K/uL   Basophils Relative 1 %   Basophils Absolute 0.1 0.0 - 0.1 K/uL   Immature Granulocytes 0 %   Abs Immature Granulocytes 0.00 0.00 - 0.07 K/uL    Comment: Performed at [facility], [address] [place], [place] [address]  HM DIABETES EYE EXAM     Status: None   Collection Time: 12/25/18 12:00 AM  Result Value Ref Range   HM Diabetic Eye Exam No Retinopathy No Retinopathy  HM DIABETES EYE EXAM     Status: Abnormal   Collection Time: 12/25/18 12:00 AM  Result Value Ref Range   HM Diabetic Eye Exam Retinopathy (A) No Retinopathy  SARS CORONAVIRUS 2 (TAT 6-24 HRS) Nasopharyngeal Nasopharyngeal Swab     Status: None   Collection Time: 01/03/19  1:54 PM   Specimen:  Nasopharyngeal Swab  Result Value Ref Range   SARS Coronavirus 2 NEGATIVE NEGATIVE    Comment: (NOTE) SARS-CoV-2 target nucleic acids are NOT DETECTED. The SARS-CoV-2 RNA is generally detectable in upper and lower respiratory specimens during the acute phase of infection. Negative results do not preclude SARS-CoV-2 infection, do not rule out co-infections with other pathogens, and should not be used as the sole basis for treatment or other patient management decisions. Negative results must be combined with clinical observations, patient history, and epidemiological information. The expected result is Negative. Fact Sheet for Patients: SugarRoll.be Fact Sheet for Healthcare Providers: https://www.woods-mathews.com/ This test is not yet approved or cleared by the [place] FDA and  has been authorized for detection and/or diagnosis of SARS-CoV-2 by FDA under an Emergency Use Authorization (EUA). This EUA will remain  in effect (meaning this test can be used) for the duration of the COVID-19 declaration under Section 56 4(b)(1) of the Act, 21 U.S.C. section 360bbb-3(b)(1), unless the authorization is terminated or revoked sooner. Performed at [facility], [place] [place], [place] [address]   Glucose, capillary     Status: Abnormal   Collection Time: 01/07/19  8:25 AM  Result Value Ref Range   Glucose-Capillary 101 (H) 70 - 99 mg/dL  Glucose, capillary     Status: Abnormal  Collection Time: 01/07/19  9:16 AM  Result Value Ref Range   Glucose-Capillary 104 (H) 70 - 99 mg/dL  POCT HgB A1C     Status: Abnormal   Collection Time: 01/08/19 12:05 PM  Result Value Ref Range   Hemoglobin A1C 5.9 (A) 4.0 - 5.6 %   HbA1c POC (<> result, manual entry)     HbA1c, POC (prediabetic range)     HbA1c, POC (controlled diabetic range)    POCT UA - Microalbumin     Status: Normal   Collection Time: 01/08/19 12:05 PM  Result Value Ref  Range   Microalbumin Ur, POC 20 mg/L   Creatinine, POC     Albumin/Creatinine Ratio, Urine, POC      Diabetic Foot Exam: Diabetic Foot Exam - Simple   Simple Foot Form Diabetic Foot exam was performed with the following findings: Yes 01/08/2019 12:33 PM  Visual Inspection No deformities, no ulcerations, no other skin breakdown bilaterally: Yes Sensation Testing Intact to touch and monofilament testing bilaterally: Yes Pulse Check Posterior Tibialis and Dorsalis pulse intact bilaterally: Yes Comments      PHQ2/9: Depression screen [facility] 2/9 01/08/2019 09/20/2018 09/10/2018 08/26/2018 04/19/2018  Decreased Interest 0 0 0 0 0  Down, Depressed, Hopeless 0 0 0 0 0  PHQ - 2 Score 0 0 0 0 0  Altered sleeping 0 0 - 0 0  Tired, decreased energy 0 0 - 0 0  Change in appetite 0 0 - 0 0  Feeling bad or failure about yourself  0 0 - 0 0  Trouble concentrating 0 0 - 0 0  Moving slowly or fidgety/restless 0 0 - 0 0  Suicidal thoughts 0 0 - 0 0  PHQ-9 Score 0 0 - 0 0  Difficult doing work/chores Not difficult at all Not difficult at all - Not difficult at all Not difficult at all  Some recent data might be hidden    phq 9 is negative   Fall Risk: Fall Risk  01/08/2019 09/20/2018 09/10/2018 08/26/2018 04/19/2018  Falls in the past year? 0 0 0 0 0  Number falls in past yr: 0 0 0 0 0  Injury with Fall? 0 0 0 0 0  Comment - - - - -  Risk for fall due to : - - - - -  Risk for fall due to: Comment - - - - -  Follow up - - Falls prevention discussed - -      Functional Status Survey: Is the patient deaf or have difficulty hearing?: No Does the patient have difficulty seeing, even when wearing glasses/contacts?: Yes Does the patient have difficulty concentrating, remembering, or making decisions?: No Does the patient have difficulty walking or climbing stairs?: No Does the patient have difficulty dressing or bathing?: No Does the patient have difficulty doing errands alone such as visiting a  doctor's office or shopping?: No    Assessment & Plan  1. Controlled type 2 diabetes mellitus with stage 3 chronic kidney disease, without long-term current use of insulin (HCC)  - POCT HgB A1C - POCT UA - Microalbumin  2. Hypertension, benign  At goal   3. Stage 3 chronic kidney disease, unspecified whether stage 3a or 3b CKD  Recheck labs next visit   4. Paroxysmal A-fib (HCC)  Today sinus rhythm   5. Hypercholesteremia  Reviewed last labs  6. Iron deficiency anemia due to chronic blood loss  Up to date with visits with Dr. Tasia Catchings  7. Pacemaker  8. Sick sinus syndrome (HCC)  Stable, has pacemaker    9. Mild protein-calorie malnutrition ([place])  Discussed protein shakes

## 2019-01-16 ENCOUNTER — Other Ambulatory Visit: Payer: Self-pay | Admitting: Family Medicine

## 2019-01-16 DIAGNOSIS — H2512 Age-related nuclear cataract, left eye: Secondary | ICD-10-CM | POA: Diagnosis not present

## 2019-01-21 ENCOUNTER — Other Ambulatory Visit: Payer: Self-pay

## 2019-01-21 ENCOUNTER — Encounter: Payer: Self-pay | Admitting: *Deleted

## 2019-01-21 NOTE — Discharge Instructions (Signed)

## 2019-01-24 ENCOUNTER — Other Ambulatory Visit
Admission: RE | Admit: 2019-01-24 | Discharge: 2019-01-24 | Disposition: A | Discharge status: Home / Self Care | Payer: Medicare HMO | Source: Ambulatory Visit | Attending: Ophthalmology | Admitting: Ophthalmology

## 2019-01-24 DIAGNOSIS — Z01812 Encounter for preprocedural laboratory examination: Secondary | ICD-10-CM | POA: Insufficient documentation

## 2019-01-24 DIAGNOSIS — Z20828 Contact with and (suspected) exposure to other viral communicable diseases: Secondary | ICD-10-CM | POA: Diagnosis not present

## 2019-01-25 LAB — SARS CORONAVIRUS 2 (TAT 6-24 HRS): SARS Coronavirus 2: NEGATIVE

## 2019-01-27 NOTE — Anesthesia Preprocedure Evaluation (Addendum)
Anesthesia Evaluation  Patient identified by MRN, date of birth, ID band Patient awake    Reviewed: Allergy & Precautions, NPO status , Patient's Chart, lab work & pertinent test results  History of Anesthesia Complications Negative for: history of anesthetic complications  Airway Mallampati: II  TM Distance: >3 FB Neck ROM: full    Dental  (+) Chipped, Missing, Poor Dentition   Pulmonary    Pulmonary exam normal        Cardiovascular Exercise Tolerance: Good hypertension, (-) angina(-) DOE + dysrhythmias (parox. afib on amiodarone, sick sinus syndrome) Atrial Fibrillation + pacemaker (SSS)    HLD  sick sinus syndrome bradycardia has history of permanent pacemaker recent pacer check was okay   atrial fibrillation paroxysmal   murmur chronic stable  cards stable: 01/2018: dr. Clayborn Bigness;   Neuro/Psych  Neuromuscular disease (peripheral neuropathy)    GI/Hepatic GERD  Controlled,  Endo/Other  diabetes, Type 2  Renal/GU CRFRenal disease (stage III CKD)     Musculoskeletal  (+) Arthritis ,   Abdominal   Peds  Hematology  (+) Blood dyscrasia, anemia ,   Anesthesia Other Findings  Pacemaker check 09/04/18   Cardiology note 02/05/18:  Plan  1 sick sinus syndrome bradycardia has history of permanent pacemaker recent pacer check was okay here for routine follow-up 2 atrial fibrillation paroxysmal on amiodarone for rhythm not on anticoagulation has appeared to maintain sinus rhythm continuously for years 3 diabetes type 2 uncomplicated hemoglobin D9R of 6.1 currently on metformin 4 hypertension reasonably controlled continue amlodipine valsartan HCTZ 5 hyperlipidemia chronic stable continue Crestor therapy for lipid management 6 murmur chronic stable continue repeat echocardiogram for further assessment evaluation 7 bradycardia underlying chronic currently asymptomatic with permanent pacemaker placed 8 have the  patient follow-up in 6 months  Return in about 6 months (around 08/07/2018).  Emma Prince Rome, MD   Reproductive/Obstetrics                            Anesthesia Physical  Anesthesia Plan  ASA: III  Anesthesia Plan: MAC   Post-op Pain Management:    Induction: Intravenous  PONV Risk Score and Plan: 2 and Midazolam and TIVA  Airway Management Planned: Nasal Cannula  Additional Equipment:   Intra-op Plan:   Post-operative Plan:   Informed Consent: I have reviewed the patients History and Physical, chart, labs and discussed the procedure including the risks, benefits and alternatives for the proposed anesthesia with the patient or authorized representative who has indicated his/her understanding and acceptance.       Plan Discussed with: CRNA  Anesthesia Plan Comments:         Anesthesia Quick Evaluation

## 2019-01-28 ENCOUNTER — Encounter
Admission: RE | Disposition: A | Discharge status: Home / Self Care | Payer: Self-pay | Source: Home / Self Care | Attending: Ophthalmology

## 2019-01-28 ENCOUNTER — Ambulatory Visit: Payer: Medicare HMO | Admitting: Anesthesiology

## 2019-01-28 ENCOUNTER — Other Ambulatory Visit: Payer: Self-pay

## 2019-01-28 ENCOUNTER — Ambulatory Visit
Admission: RE | Admit: 2019-01-28 | Discharge: 2019-01-28 | Disposition: A | Discharge status: Home / Self Care | Payer: Medicare HMO | Attending: Ophthalmology | Admitting: Ophthalmology

## 2019-01-28 DIAGNOSIS — E1122 Type 2 diabetes mellitus with diabetic chronic kidney disease: Secondary | ICD-10-CM | POA: Diagnosis not present

## 2019-01-28 DIAGNOSIS — Z7984 Long term (current) use of oral hypoglycemic drugs: Secondary | ICD-10-CM | POA: Diagnosis not present

## 2019-01-28 DIAGNOSIS — D649 Anemia, unspecified: Secondary | ICD-10-CM | POA: Insufficient documentation

## 2019-01-28 DIAGNOSIS — Z950 Presence of cardiac pacemaker: Secondary | ICD-10-CM | POA: Diagnosis not present

## 2019-01-28 DIAGNOSIS — E1136 Type 2 diabetes mellitus with diabetic cataract: Secondary | ICD-10-CM | POA: Diagnosis not present

## 2019-01-28 DIAGNOSIS — I495 Sick sinus syndrome: Secondary | ICD-10-CM | POA: Insufficient documentation

## 2019-01-28 DIAGNOSIS — I480 Paroxysmal atrial fibrillation: Secondary | ICD-10-CM | POA: Insufficient documentation

## 2019-01-28 DIAGNOSIS — E7800 Pure hypercholesterolemia, unspecified: Secondary | ICD-10-CM | POA: Diagnosis not present

## 2019-01-28 DIAGNOSIS — E785 Hyperlipidemia, unspecified: Secondary | ICD-10-CM | POA: Insufficient documentation

## 2019-01-28 DIAGNOSIS — H25812 Combined forms of age-related cataract, left eye: Secondary | ICD-10-CM | POA: Diagnosis not present

## 2019-01-28 DIAGNOSIS — N183 Chronic kidney disease, stage 3 unspecified: Secondary | ICD-10-CM | POA: Insufficient documentation

## 2019-01-28 DIAGNOSIS — Z79899 Other long term (current) drug therapy: Secondary | ICD-10-CM | POA: Insufficient documentation

## 2019-01-28 DIAGNOSIS — I129 Hypertensive chronic kidney disease with stage 1 through stage 4 chronic kidney disease, or unspecified chronic kidney disease: Secondary | ICD-10-CM | POA: Diagnosis not present

## 2019-01-28 DIAGNOSIS — H2512 Age-related nuclear cataract, left eye: Secondary | ICD-10-CM | POA: Insufficient documentation

## 2019-01-28 HISTORY — PX: CATARACT EXTRACTION W/PHACO: SHX586

## 2019-01-28 LAB — GLUCOSE, CAPILLARY
Glucose-Capillary: 115 mg/dL — ABNORMAL HIGH (ref 70–99)
Glucose-Capillary: 103 mg/dL — ABNORMAL HIGH (ref 70–99)

## 2019-01-28 SURGERY — PHACOEMULSIFICATION, CATARACT, WITH IOL INSERTION
Anesthesia: Monitor Anesthesia Care | Site: Eye | Laterality: Left

## 2019-01-28 MED ORDER — ARMC OPHTHALMIC DILATING DROPS
1.0000 "application " | OPHTHALMIC | Status: DC | PRN
  Administered 2019-01-28 (×3): 1 via OPHTHALMIC

## 2019-01-28 MED ORDER — ONDANSETRON HCL 4 MG/2ML IJ SOLN: 4.0000 mg | Freq: Once | INTRAMUSCULAR | Status: DC | PRN

## 2019-01-28 MED ORDER — MIDAZOLAM HCL 2 MG/2ML IJ SOLN
INTRAMUSCULAR | Status: DC | PRN
  Administered 2019-01-28: 1 mg via INTRAVENOUS

## 2019-01-28 MED ORDER — EPINEPHRINE PF 1 MG/ML IJ SOLN
INTRAOCULAR | Status: DC | PRN
  Administered 2019-01-28: 76 mL via OPHTHALMIC

## 2019-01-28 MED ORDER — LACTATED RINGERS IV SOLN: 100.0000 mL/h | INTRAVENOUS | Status: DC

## 2019-01-28 MED ORDER — LIDOCAINE HCL (PF) 2 % IJ SOLN
INTRAOCULAR | Status: DC | PRN
  Administered 2019-01-28: 1 mL

## 2019-01-28 MED ORDER — TETRACAINE HCL 0.5 % OP SOLN
1.0000 [drp] | OPHTHALMIC | Status: DC | PRN
  Administered 2019-01-28 (×3): 1 [drp] via OPHTHALMIC

## 2019-01-28 MED ORDER — MOXIFLOXACIN HCL 0.5 % OP SOLN
OPHTHALMIC | Status: DC | PRN
  Administered 2019-01-28: 0.2 mL via OPHTHALMIC

## 2019-01-28 MED ORDER — ACETAMINOPHEN 10 MG/ML IV SOLN: 1000.0000 mg | Freq: Once | INTRAVENOUS | Status: DC | PRN

## 2019-01-28 MED ORDER — BRIMONIDINE TARTRATE-TIMOLOL 0.2-0.5 % OP SOLN
OPHTHALMIC | Status: DC | PRN
  Administered 2019-01-28: 1 [drp] via OPHTHALMIC

## 2019-01-28 MED ORDER — NA CHONDROIT SULF-NA HYALURON 40-17 MG/ML IO SOLN
INTRAOCULAR | Status: DC | PRN
  Administered 2019-01-28: 1 mL via INTRAOCULAR

## 2019-01-28 SURGICAL SUPPLY — 18 items
CANNULA ANT/CHMB 27GA (MISCELLANEOUS) ×6 IMPLANT
GLOVE SURG LX 8.0 MICRO (GLOVE) ×2
GLOVE SURG LX STRL 8.0 MICRO (GLOVE) ×1 IMPLANT
GLOVE SURG TRIUMPH 8.0 PF LTX (GLOVE) ×3 IMPLANT
GOWN STRL REUS W/ TWL LRG LVL3 (GOWN DISPOSABLE) ×2 IMPLANT
GOWN STRL REUS W/TWL LRG LVL3 (GOWN DISPOSABLE) ×4
LENS IOL TECNIS ITEC 24.5 (Intraocular Lens) ×3 IMPLANT
MARKER SKIN DUAL TIP RULER LAB (MISCELLANEOUS) ×3 IMPLANT
NDL RETROBULBAR .5 NSTRL (NEEDLE) ×3 IMPLANT
NEEDLE FILTER BLUNT 18X 1/2SAF (NEEDLE) ×2
NEEDLE FILTER BLUNT 18X1 1/2 (NEEDLE) ×1 IMPLANT
PACK EYE AFTER SURG (MISCELLANEOUS) ×3 IMPLANT
PACK OPTHALMIC (MISCELLANEOUS) ×3 IMPLANT
PACK PORFILIO (MISCELLANEOUS) ×3 IMPLANT
SYR 3ML LL SCALE MARK (SYRINGE) ×3 IMPLANT
SYR TB 1ML LUER SLIP (SYRINGE) ×3 IMPLANT
WATER STERILE IRR 250ML POUR (IV SOLUTION) ×3 IMPLANT
WIPE NON LINTING 3.25X3.25 (MISCELLANEOUS) ×3 IMPLANT

## 2019-01-28 NOTE — H&P (Signed)
All labs reviewed. Abnormal studies sent to patients PCP when indicated.  Previous H&P reviewed, patient examined, there are NO CHANGES.  Emma Strohecker Porfilio12/1/20208:57 AM

## 2019-01-28 NOTE — Op Note (Signed)
PREOPERATIVE DIAGNOSIS:  Nuclear sclerotic cataract of the left eye.   POSTOPERATIVE DIAGNOSIS:  Nuclear sclerotic cataract of the left eye.   OPERATIVE PROCEDURE:@   SURGEON:  Birder Robson, MD.   ANESTHESIA:  Anesthesiologist: Heniser, Fredric Dine, MD CRNA: Izetta Dakin, CRNA  1.      Managed anesthesia care. 2.     0.64ml of Shugarcaine was instilled following the paracentesis   COMPLICATIONS:  None.   TECHNIQUE:   Stop and chop   DESCRIPTION OF PROCEDURE:  The patient was examined and consented in the preoperative holding area where the aforementioned topical anesthesia was applied to the left eye and then brought back to the Operating Room where the left eye was prepped and draped in the usual sterile ophthalmic fashion and a lid speculum was placed. A paracentesis was created with the side port blade and the anterior chamber was filled with viscoelastic. A near clear corneal incision was performed with the steel keratome. A continuous curvilinear capsulorrhexis was performed with a cystotome followed by the capsulorrhexis forceps. Hydrodissection and hydrodelineation were carried out with BSS on a blunt cannula. The lens was removed in a stop and chop  technique and the remaining cortical material was removed with the irrigation-aspiration handpiece. The capsular bag was inflated with viscoelastic and the Technis [id number] lens was placed in the capsular bag without complication. The remaining viscoelastic was removed from the eye with the irrigation-aspiration handpiece. The wounds were hydrated. The anterior chamber was flushed with BSS and the eye was inflated to physiologic pressure. 0.36ml Vigamox was placed in the anterior chamber. The wounds were found to be water tight. The eye was dressed with Combigan. The patient was given protective glasses to wear throughout the day and a shield with which to sleep tonight. The patient was also given drops with which to begin a drop regimen today  and will follow-up with me in one day. Implant Name Type Inv. Item Serial No. Manufacturer Lot No. LRB No. Used Action  LENS IOL DIOP 24.5 - [id number] Intraocular Lens LENS IOL DIOP 24.5 [id number]  Left 1 Implanted    Procedure(s) with comments: CATARACT EXTRACTION PHACO AND INTRAOCULAR LENS PLACEMENT (IOC) LEFT DIABETIC 12.96  01:15.5 (Left) - Diabetic - oral meds  Electronically signed: Birder Robson 01/28/2019 9:27 AM

## 2019-01-28 NOTE — Transfer of Care (Signed)
Immediate Anesthesia Transfer of Care Note  Patient: Emma Campbell  Procedure(s) Performed: CATARACT EXTRACTION PHACO AND INTRAOCULAR LENS PLACEMENT (IOC) LEFT DIABETIC 12.96  01:15.5 (Left Eye)  Patient Location: PACU  Anesthesia Type: MAC  Level of Consciousness: awake, alert  and patient cooperative  Airway and Oxygen Therapy: Patient Spontanous Breathing and Patient connected to supplemental oxygen  Post-op Assessment: Post-op Vital signs reviewed, Patient's Cardiovascular Status Stable, Respiratory Function Stable, Patent Airway and No signs of Nausea or vomiting  Post-op Vital Signs: Reviewed and stable  Complications: No apparent anesthesia complications

## 2019-01-28 NOTE — Anesthesia Procedure Notes (Signed)
Procedure Name: MAC Performed by: Dakiyah Heinke M, CRNA Pre-anesthesia Checklist: Timeout performed, Patient being monitored, Suction available, Emergency Drugs available and Patient identified Patient Re-evaluated:Patient Re-evaluated prior to induction Oxygen Delivery Method: Nasal cannula       

## 2019-01-28 NOTE — Anesthesia Postprocedure Evaluation (Signed)
Anesthesia Post Note  Patient: Emma Campbell  Procedure(s) Performed: CATARACT EXTRACTION PHACO AND INTRAOCULAR LENS PLACEMENT (IOC) LEFT DIABETIC 12.96  01:15.5 (Left Eye)     Patient location during evaluation: PACU Anesthesia Type: MAC Level of consciousness: awake and alert Pain management: pain level controlled Vital Signs Assessment: post-procedure vital signs reviewed and stable Respiratory status: spontaneous breathing, nonlabored ventilation, respiratory function stable and patient connected to nasal cannula oxygen Cardiovascular status: stable and blood pressure returned to baseline Postop Assessment: no apparent nausea or vomiting Anesthetic complications: no    Bentleigh Waren A  Bill Mcvey

## 2019-01-29 ENCOUNTER — Encounter: Payer: Self-pay | Admitting: Ophthalmology

## 2019-02-15 ENCOUNTER — Other Ambulatory Visit: Payer: Self-pay | Admitting: Family Medicine

## 2019-02-15 DIAGNOSIS — N183 Chronic kidney disease, stage 3 unspecified: Secondary | ICD-10-CM

## 2019-02-15 DIAGNOSIS — E1122 Type 2 diabetes mellitus with diabetic chronic kidney disease: Secondary | ICD-10-CM

## 2019-03-31 ENCOUNTER — Other Ambulatory Visit: Payer: Self-pay | Admitting: Family Medicine

## 2019-03-31 ENCOUNTER — Telehealth: Payer: Self-pay | Admitting: Family Medicine

## 2019-03-31 DIAGNOSIS — E785 Hyperlipidemia, unspecified: Secondary | ICD-10-CM

## 2019-03-31 MED ORDER — ATORVASTATIN CALCIUM 20 MG PO TABS: 20.0000 mg | ORAL_TABLET | Freq: Every day | ORAL | 1 refills | Status: DC

## 2019-03-31 NOTE — Telephone Encounter (Signed)
Medication Refill - Medication:  atorvastatin (LIPITOR) 20 MG tablet [id number]  Has the patient contacted their pharmacy? No. (Agent: If no, request that the patient contact the pharmacy for the refill.) (Agent: If yes, when and what did the pharmacy advise?)  Preferred Pharmacy (with phone number or street name): [facility] mail order [facility]   Agent: Please be advised that RX refills may take up to 3 business days. We ask that you follow-up with your pharmacy.

## 2019-04-02 DIAGNOSIS — I10 Essential (primary) hypertension: Secondary | ICD-10-CM | POA: Diagnosis not present

## 2019-04-02 DIAGNOSIS — E7800 Pure hypercholesterolemia, unspecified: Secondary | ICD-10-CM | POA: Diagnosis not present

## 2019-04-02 DIAGNOSIS — R011 Cardiac murmur, unspecified: Secondary | ICD-10-CM | POA: Diagnosis not present

## 2019-04-02 DIAGNOSIS — I495 Sick sinus syndrome: Secondary | ICD-10-CM | POA: Diagnosis not present

## 2019-04-02 DIAGNOSIS — N183 Chronic kidney disease, stage 3 unspecified: Secondary | ICD-10-CM | POA: Diagnosis not present

## 2019-04-02 DIAGNOSIS — I4891 Unspecified atrial fibrillation: Secondary | ICD-10-CM | POA: Diagnosis not present

## 2019-04-02 DIAGNOSIS — E1122 Type 2 diabetes mellitus with diabetic chronic kidney disease: Secondary | ICD-10-CM | POA: Diagnosis not present

## 2019-05-08 ENCOUNTER — Encounter: Payer: Self-pay | Admitting: Family Medicine

## 2019-05-08 ENCOUNTER — Ambulatory Visit (INDEPENDENT_AMBULATORY_CARE_PROVIDER_SITE_OTHER): Payer: Medicare HMO | Admitting: Family Medicine

## 2019-05-08 VITALS — BP 121/65 | HR 77 | Ht 61.0 in | Wt 114.0 lb

## 2019-05-08 DIAGNOSIS — I495 Sick sinus syndrome: Secondary | ICD-10-CM

## 2019-05-08 DIAGNOSIS — E441 Mild protein-calorie malnutrition: Secondary | ICD-10-CM | POA: Diagnosis not present

## 2019-05-08 DIAGNOSIS — E1122 Type 2 diabetes mellitus with diabetic chronic kidney disease: Secondary | ICD-10-CM

## 2019-05-08 DIAGNOSIS — E538 Deficiency of other specified B group vitamins: Secondary | ICD-10-CM

## 2019-05-08 DIAGNOSIS — D500 Iron deficiency anemia secondary to blood loss (chronic): Secondary | ICD-10-CM

## 2019-05-08 DIAGNOSIS — M8589 Other specified disorders of bone density and structure, multiple sites: Secondary | ICD-10-CM | POA: Diagnosis not present

## 2019-05-08 DIAGNOSIS — I10 Essential (primary) hypertension: Secondary | ICD-10-CM

## 2019-05-08 DIAGNOSIS — I480 Paroxysmal atrial fibrillation: Secondary | ICD-10-CM

## 2019-05-08 DIAGNOSIS — N1831 Chronic kidney disease, stage 3a: Secondary | ICD-10-CM

## 2019-05-08 DIAGNOSIS — E1121 Type 2 diabetes mellitus with diabetic nephropathy: Secondary | ICD-10-CM

## 2019-05-08 DIAGNOSIS — N183 Chronic kidney disease, stage 3 unspecified: Secondary | ICD-10-CM

## 2019-05-08 DIAGNOSIS — I129 Hypertensive chronic kidney disease with stage 1 through stage 4 chronic kidney disease, or unspecified chronic kidney disease: Secondary | ICD-10-CM

## 2019-05-08 MED ORDER — AMLODIPINE BESYLATE-VALSARTAN 10-320 MG PO TABS: 1.0000 | ORAL_TABLET | Freq: Every day | ORAL | 0 refills | Status: DC

## 2019-05-08 NOTE — Progress Notes (Signed)
Name: Emma Campbell   MRN: [id number]    DOB: 20-Aug-1932   Date:05/08/2019       Progress Note  Subjective  Chief Complaint  Chief Complaint  Patient presents with  . Medication Refill  . Diabetes  . Hypertension  . Hyperlipidemia  . Sick Sinus Syndrome  . Osteopenia    I connected with  Emma Campbell on 05/08/19 at  9:20 AM EST by telephone and verified that I am speaking with the correct person using two identifiers.  I discussed the limitations, risks, security and privacy concerns of performing an evaluation and management service by telephone and the availability of in person appointments. Staff also discussed with the patient that there may be a patient responsible charge related to this service. Patient Location: at home  Provider Location: [facility]   HPI  DMII with renal manifestation: CKI and microalbuminuria. HgbA1C was6.4%03/2018 and today is down to 5.9%She denies hypoglycemia episodes. She feels well. Glucose at home is getting checked a couple of times weekly , values in the 120 range  She denies polyphagia, polydipsia or polyuria ( except right after she takes HCTZ) Continue ARB for kidney protection.Eye exam is up to date, recheck labs next visit - in person   HTN: taking medicationdaily and bp is at goal, no dizziness, chest pain or palpitation.BP has been in the 120's/70's range. Taking hctz 12.5 mg. Towards low end of normal but since no orthostatic changes we will keep current dose of medication   Hyperlipidemia:she has been taking Rosuvastatin daily and denies side effects at this time, she is due for labs but it was a remote visit and we will recheck it next time.   Sick Sinus Syndrome: she had a pacemaker placed in 2006and has been onAmiodarone since 2006, she is now down to 100 mg of Amiodarone daily, last TSH was back to normal.No chest pain or palpitation.Sees Dr. Clayborn Bigness, last visit02/2021 , she is [name] score of 2,  and is on aspirin onlyper his recommendation.She is at a high risk of falls because of age and does not want anti-coagulant. She also has a history of Afib  Vitamin D deficiency: still taking supplementation, denies fatigue,last Vitamin D was at goal recheck yearly   Osteopenia: last FRAX showed osteopenia, not taking Alendronate, we will recheck bone density test next year   Anemia: iron deficiency and positive hemoccult, seen by Dr. Durwin Reges andhadEGD and colonoscopy September 2018,diagnosed with AVM small bowel and had it cauterized,hemoglobin dropped and was referred to Dr. Tasia Catchings, labs are being monitored by her, last HCTwas was 10.8 . No recent iron infusion   B12 deficiency:  Next visit is April 2021, she has been off supplementation because last level was very high   Malnourished: explained that she lost 16 lbs in the previous year ( she was sick with URI)  She is now taking glucerna shakes and has gained 6 lbs since Nov 2020    Patient Active Problem List   Diagnosis Date Noted  . B12 deficiency 05/21/2017  . Gastrointestinal tract imaging abnormality   . Iron deficiency anemia   . Polyp of sigmoid colon   . Type 2 diabetes mellitus with stage 2 chronic kidney disease, without long-term current use of insulin ([place]) 08/03/2015  . Arthritis, degenerative 05/03/2015  . Menopausal and perimenopausal disorder 05/03/2015  . Chronic kidney disease (CKD), stage III (moderate) 11/30/2014  . High risk medication use 11/30/2014  . Diverticulosis 11/30/2014  . Fibrocystic  breast disease 11/30/2014  . Elevated LFTs 11/30/2014  . Systolic ejection murmur [id number]  . Paroxysmal A-fib ([place]) 11/30/2014  . Microalbuminuria 07/31/2014  . Elevated TSH 07/31/2014  . History of cardiac pacemaker 07/31/2014  . Sick sinus syndrome ([place]) 08/13/2013  . Hypercholesteremia 08/13/2013  . Hypertension, benign 08/13/2013  . Diabetes mellitus with renal manifestations, controlled ([place]) 08/13/2013    . Avitaminosis D 04/12/2009    Past Surgical History:  Procedure Laterality Date  . CATARACT EXTRACTION W/PHACO Right 01/07/2019   Procedure: CATARACT EXTRACTION PHACO AND INTRAOCULAR LENS PLACEMENT (IOC) RIGHT DIABETIC 02:15.1          25.7%        34.69;  Surgeon: Birder Robson, MD;  Location: [place];  Service: Ophthalmology;  Laterality: Right;  Diabetic - oral meds  . CATARACT EXTRACTION W/PHACO Left 01/28/2019   Procedure: CATARACT EXTRACTION PHACO AND INTRAOCULAR LENS PLACEMENT (IOC) LEFT DIABETIC 12.96  01:15.5;  Surgeon: Birder Robson, MD;  Location: [place];  Service: Ophthalmology;  Laterality: Left;  Diabetic - oral meds  . COLONOSCOPY WITH PROPOFOL N/A 11/06/2016   Procedure: COLONOSCOPY WITH PROPOFOL;  Surgeon: Lucilla Lame, MD;  Location: [place];  Service: Endoscopy;  Laterality: N/A;  DIABETIC  . ESOPHAGOGASTRODUODENOSCOPY (EGD) WITH PROPOFOL N/A 11/06/2016   Procedure: ESOPHAGOGASTRODUODENOSCOPY (EGD) WITH PROPOFOL;  Surgeon: Lucilla Lame, MD;  Location: [place];  Service: Endoscopy;  Laterality: N/A;  . ESOPHAGOGASTRODUODENOSCOPY (EGD) WITH PROPOFOL N/A 01/23/2017   Procedure: ESOPHAGOGASTRODUODENOSCOPY (EGD) WITH PROPOFOL with PUSH;  Surgeon: Lucilla Lame, MD;  Location: [facility] ENDOSCOPY;  Service: Endoscopy;  Laterality: N/A;  . GIVENS CAPSULE STUDY N/A 12/22/2016   Procedure: GIVENS CAPSULE STUDY;  Surgeon: Lucilla Lame, MD;  Location[facility] ENDOSCOPY;  Service: Endoscopy;  Laterality: N/A;  . INSERT / REPLACE / REMOVE PACEMAKER    . PACEMAKER INSERTION  2006  . POLYPECTOMY  11/06/2016   Procedure: POLYPECTOMY;  Surgeon: Lucilla Lame, MD;  Location: [place];  Service: Endoscopy;;    Family History  Problem Relation Age of Onset  . Hypertension Mother   . Stroke Mother   . Diabetes Daughter   . Stroke Father   . Healthy Sister   . Hypertension Maternal Grandmother     Social History   Tobacco Use   . Smoking status: Never Smoker  . Smokeless tobacco: Never Used  . Tobacco comment: smoking cessation materials not required  Substance Use Topics  . Alcohol use: No    Alcohol/week: 0.0 standard drinks    Current Outpatient Medications:  .  amiodarone (PACERONE) 200 MG tablet, Take 100 mg by mouth daily., Disp: , Rfl:  .  amLODipine-valsartan (EXFORGE) 10-320 MG tablet, TAKE 1 TABLET EVERY DAY, Disp: 90 tablet, Rfl: 0 .  aspirin 81 MG tablet, Take 81 mg by mouth daily., Disp: , Rfl:  .  atorvastatin (LIPITOR) 20 MG tablet, Take 1 tablet (20 mg total) by mouth daily., Disp: 90 tablet, Rfl: 1 .  cholecalciferol (VITAMIN D) 1000 UNITS tablet, Take 200 Units by mouth daily., Disp: , Rfl:  .  ferrous sulfate 325 (65 FE) MG EC tablet, Take 1 tablet (325 mg total) by mouth 2 (two) times daily with a meal., Disp: 180 tablet, Rfl: 2 .  folic acid (FOLVITE) A999333 MCG tablet, Take 400 mcg by mouth daily., Disp: , Rfl:  .  hydrochlorothiazide (HYDRODIURIL) 12.5 MG tablet, TAKE 1 TABLET EVERY DAY, Disp: 90 tablet, Rfl: 0 .  metFORMIN (GLUCOPHAGE-XR) 750 MG 24  hr tablet, TAKE 1 TABLET EVERY EVENING, Disp: 90 tablet, Rfl: 1 .  DUREZOL 0.05 % EMUL, Apply 1 drop to eye 2 (two) times daily., Disp: , Rfl:  .  ILEVRO 0.3 % ophthalmic suspension, 1 drop 2 (two) times daily., Disp: , Rfl:   Current Facility-Administered Medications:  .  cyanocobalamin ((VITAMIN B-12)) injection 1,000 mcg, 1,000 mcg, Intramuscular, Q30 days, Steele Sizer, MD, 1,000 mcg at 07/08/18 [id number]  Facility-Administered Medications Ordered in Other Visits:  .  0.9 %  sodium chloride infusion, , Intravenous, Continuous, Earlie Server, MD, Last Rate: 10 mL/hr at 06/19/18 1339, New Bag at 06/19/18 1339  Allergies  Allergen Reactions  . Ace Inhibitors Other (See Comments)    Pt unable to recall. Was advised to avoid    I personally reviewed active problem list, medication list, allergies, family history, social history with the  patient/caregiver today.   ROS  Ten systems reviewed and is negative except as mentioned in HPI   Objective  Virtual encounter, vitals not obtained.  Body mass index is 21.54 kg/m.  Physical Exam  Awake, alert and oriented  PHQ2/9: Depression screen [facility] 2/9 05/08/2019 01/08/2019 09/20/2018 09/10/2018 08/26/2018  Decreased Interest 0 0 0 0 0  Down, Depressed, Hopeless 0 0 0 0 0  PHQ - 2 Score 0 0 0 0 0  Altered sleeping 0 0 0 - 0  Tired, decreased energy 0 0 0 - 0  Change in appetite 0 0 0 - 0  Feeling bad or failure about yourself  0 0 0 - 0  Trouble concentrating 0 0 0 - 0  Moving slowly or fidgety/restless 0 0 0 - 0  Suicidal thoughts 0 0 0 - 0  PHQ-9 Score 0 0 0 - 0  Difficult doing work/chores Not difficult at all Not difficult at all Not difficult at all - Not difficult at all  Some recent data might be hidden   PHQ-2/9 Result is negative.    Fall Risk: Fall Risk  05/08/2019 01/08/2019 09/20/2018 09/10/2018 08/26/2018  Falls in the past year? 0 0 0 0 0  Number falls in past yr: 0 0 0 0 0  Injury with Fall? 0 0 0 0 0  Comment - - - - -  Risk for fall due to : - - - - -  Risk for fall due to: Comment - - - - -  Follow up - - - Falls prevention discussed -     Assessment & Plan  1. Hypertension, benign  - amLODipine-valsartan (EXFORGE) 10-320 MG tablet; Take 1 tablet by mouth daily.  Dispense: 90 tablet; Refill: 0  Advised her to hold HCTZ the week prior to her next visit and if bp at goal we will stop it   2. Sick sinus syndrome (HCC)  No problems  3. Hypertension associated with stage 3 chronic kidney disease due to type 2 diabetes mellitus ([place])  Continue medications   4. Iron deficiency anemia due to chronic blood loss  Under the care of Dr. Tasia Catchings  5. Mild protein-calorie malnutrition ([place])  Continue glucerna , gained a little weight since last visit  6. B12 deficiency  Seeing Dr. Tasia Catchings  7. Paroxysmal A-fib (HCC)  No problems   8. Type 2  diabetes mellitus with stage 3a chronic kidney disease, without long-term current use of insulin ([place])   9. Osteopenia of multiple sites  - DG Bone Density; Future   I discussed the assessment and treatment plan with the patient.  The patient was provided an opportunity to ask questions and all were answered. The patient agreed with the plan and demonstrated an understanding of the instructions.   The patient was advised to call back or seek an in-person evaluation if the symptoms worsen or if the condition fails to improve as anticipated.  I provided 25  minutes of non-face-to-face time during this encounter.  Loistine Chance, MD

## 2019-06-11 ENCOUNTER — Other Ambulatory Visit: Payer: Self-pay

## 2019-06-11 ENCOUNTER — Inpatient Hospital Stay: Payer: Medicare HMO | Attending: Oncology

## 2019-06-11 DIAGNOSIS — E1122 Type 2 diabetes mellitus with diabetic chronic kidney disease: Secondary | ICD-10-CM | POA: Insufficient documentation

## 2019-06-11 DIAGNOSIS — Z833 Family history of diabetes mellitus: Secondary | ICD-10-CM | POA: Insufficient documentation

## 2019-06-11 DIAGNOSIS — Z823 Family history of stroke: Secondary | ICD-10-CM | POA: Diagnosis not present

## 2019-06-11 DIAGNOSIS — Z596 Low income: Secondary | ICD-10-CM | POA: Insufficient documentation

## 2019-06-11 DIAGNOSIS — Z7984 Long term (current) use of oral hypoglycemic drugs: Secondary | ICD-10-CM | POA: Insufficient documentation

## 2019-06-11 DIAGNOSIS — N183 Chronic kidney disease, stage 3 unspecified: Secondary | ICD-10-CM | POA: Diagnosis not present

## 2019-06-11 DIAGNOSIS — D500 Iron deficiency anemia secondary to blood loss (chronic): Secondary | ICD-10-CM

## 2019-06-11 DIAGNOSIS — D631 Anemia in chronic kidney disease: Secondary | ICD-10-CM | POA: Insufficient documentation

## 2019-06-11 DIAGNOSIS — Z8249 Family history of ischemic heart disease and other diseases of the circulatory system: Secondary | ICD-10-CM | POA: Insufficient documentation

## 2019-06-11 DIAGNOSIS — I129 Hypertensive chronic kidney disease with stage 1 through stage 4 chronic kidney disease, or unspecified chronic kidney disease: Secondary | ICD-10-CM | POA: Diagnosis not present

## 2019-06-11 DIAGNOSIS — Z79899 Other long term (current) drug therapy: Secondary | ICD-10-CM | POA: Diagnosis not present

## 2019-06-11 LAB — CBC WITH DIFFERENTIAL/PLATELET
Abs Immature Granulocytes: 0.01 10*3/uL (ref 0.00–0.07)
Basophils Absolute: 0.1 10*3/uL (ref 0.0–0.1)
Basophils Relative: 1 %
Eosinophils Absolute: 0.1 10*3/uL (ref 0.0–0.5)
Eosinophils Relative: 3 %
HCT: 35 % — ABNORMAL LOW (ref 36.0–46.0)
Hemoglobin: 11.4 g/dL — ABNORMAL LOW (ref 12.0–15.0)
Immature Granulocytes: 0 %
Lymphocytes Relative: 41 %
Lymphs Abs: 1.7 10*3/uL (ref 0.7–4.0)
MCH: 29.8 pg (ref 26.0–34.0)
MCHC: 32.6 g/dL (ref 30.0–36.0)
MCV: 91.4 fL (ref 80.0–100.0)
Monocytes Absolute: 0.4 10*3/uL (ref 0.1–1.0)
Monocytes Relative: 8 %
Neutro Abs: 2 10*3/uL (ref 1.7–7.7)
Neutrophils Relative %: 47 %
Platelets: 219 10*3/uL (ref 150–400)
RBC: 3.83 MIL/uL — ABNORMAL LOW (ref 3.87–5.11)
RDW: 12.4 % (ref 11.5–15.5)
WBC: 4.3 10*3/uL (ref 4.0–10.5)
nRBC: 0 % (ref 0.0–0.2)

## 2019-06-11 LAB — IRON AND TIBC
Iron: 82 ug/dL (ref 28–170)
Saturation Ratios: 26 % (ref 10.4–31.8)
TIBC: 314 ug/dL (ref 250–450)
UIBC: 232 ug/dL

## 2019-06-11 LAB — RETIC PANEL
Immature Retic Fract: 5.2 % (ref 2.3–15.9)
RBC.: 3.87 MIL/uL (ref 3.87–5.11)
Retic Count, Absolute: 56.9 10*3/uL (ref 19.0–186.0)
Retic Ct Pct: 1.5 % (ref 0.4–3.1)
Reticulocyte Hemoglobin: 32.9 pg (ref >27.9)

## 2019-06-11 LAB — FERRITIN: Ferritin: 166 ng/mL (ref 11–307)

## 2019-06-13 ENCOUNTER — Other Ambulatory Visit: Payer: Self-pay

## 2019-06-13 ENCOUNTER — Inpatient Hospital Stay: Payer: Medicare HMO

## 2019-06-13 ENCOUNTER — Inpatient Hospital Stay (HOSPITAL_BASED_OUTPATIENT_CLINIC_OR_DEPARTMENT_OTHER): Payer: Medicare HMO | Admitting: Oncology

## 2019-06-13 ENCOUNTER — Encounter: Payer: Self-pay | Admitting: Oncology

## 2019-06-13 VITALS — BP 148/68 | HR 80 | Temp 97.0°F | Resp 18 | Wt 112.6 lb

## 2019-06-13 DIAGNOSIS — Z8639 Personal history of other endocrine, nutritional and metabolic disease: Secondary | ICD-10-CM

## 2019-06-13 DIAGNOSIS — D500 Iron deficiency anemia secondary to blood loss (chronic): Secondary | ICD-10-CM

## 2019-06-13 DIAGNOSIS — D631 Anemia in chronic kidney disease: Secondary | ICD-10-CM

## 2019-06-13 DIAGNOSIS — I129 Hypertensive chronic kidney disease with stage 1 through stage 4 chronic kidney disease, or unspecified chronic kidney disease: Secondary | ICD-10-CM | POA: Diagnosis not present

## 2019-06-13 DIAGNOSIS — Z79899 Other long term (current) drug therapy: Secondary | ICD-10-CM | POA: Diagnosis not present

## 2019-06-13 DIAGNOSIS — Z833 Family history of diabetes mellitus: Secondary | ICD-10-CM | POA: Diagnosis not present

## 2019-06-13 DIAGNOSIS — N1831 Chronic kidney disease, stage 3a: Secondary | ICD-10-CM | POA: Diagnosis not present

## 2019-06-13 DIAGNOSIS — Z8249 Family history of ischemic heart disease and other diseases of the circulatory system: Secondary | ICD-10-CM | POA: Diagnosis not present

## 2019-06-13 DIAGNOSIS — N183 Chronic kidney disease, stage 3 unspecified: Secondary | ICD-10-CM | POA: Diagnosis not present

## 2019-06-13 DIAGNOSIS — E1122 Type 2 diabetes mellitus with diabetic chronic kidney disease: Secondary | ICD-10-CM | POA: Diagnosis not present

## 2019-06-13 DIAGNOSIS — Z7984 Long term (current) use of oral hypoglycemic drugs: Secondary | ICD-10-CM | POA: Diagnosis not present

## 2019-06-13 DIAGNOSIS — Z596 Low income: Secondary | ICD-10-CM | POA: Diagnosis not present

## 2019-06-13 NOTE — Progress Notes (Signed)
Pt in for follow up, states energy levels are good.  Denies any concerns today.

## 2019-06-13 NOTE — Progress Notes (Signed)
Hematology/Oncology follow up note [facility] Telephone:(336) [phone] [phone]) [phone]   Patient Care Team: Steele Sizer, MD as PCP - General (Family Medicine) Yolonda Kida, MD as Consulting Physician (Cardiology) Earlie Server, MD as Consulting Physician (Oncology)  REFERRING PROVIDER: Steele Sizer, MD REASON FOR VISIT Follow up for treatment of anemia  HISTORY OF PRESENTING ILLNESS:  Emma Campbell is a  86 y.o.  female with PMH listed below who was referred to me for evaluation of anemia.  Patient follows up with primary care physician and has had labs done recently. 04/17/17 CBC showed hemoglobin 9, MCV 81.9, platelet 245,000, WBC 4.1, normal differential.  Iron panel showed TIBC 442, ferritin 11, saturation 15%. Previous GI workup:  # 01/23/2017 EGD showed non bleeding angiotecsia, treated with APC. 12/22/2016 Capsule study showed duodenum AVM.  11/06/2016 colonoscopy showed non bleeding hemorroids, sigmoid polyp, and diverticulosis.   INTERVAL HISTORY Emma Campbell is a 86 y.o. female who has above history reviewed by me today presents for follow up for management of iron deficiency anemia.  She reports feeling pretty well today.  Patient reports feeling well.  No fatigue.  She takes oral iron supplementation twice daily.  No new complaints.  Review of Systems  Constitutional: Negative for chills, fever, malaise/fatigue and weight loss.  HENT: Negative for sore throat.   Eyes: Negative for redness.  Respiratory: Negative for cough, shortness of breath and wheezing.   Cardiovascular: Negative for chest pain, palpitations and leg swelling.  Gastrointestinal: Negative for abdominal pain, blood in stool, nausea and vomiting.  Genitourinary: Negative for dysuria.  Musculoskeletal: Negative for myalgias.  Skin: Negative for rash.  Neurological: Negative for dizziness, tingling and tremors.  Endo/Heme/Allergies: Does not bruise/bleed easily.    Psychiatric/Behavioral: Negative for hallucinations.    MEDICAL HISTORY:  Past Medical History:  Diagnosis Date  . Anemia   . Chronic kidney disease, stage III (moderate)   . Diverticulosis   . Elevated LFTs   . Essential hypertension, malignant   . Fibrocystic breast disease   . Heart murmur   . Hyperlipidemia   . Menopausal problem   . Osteoarthrosis   . Osteoporosis   . Peripheral autonomic neuropathy   . Presence of permanent cardiac pacemaker   . Sick sinus syndrome (HCC)    Dr. Alfredo Batty  . Type II diabetes mellitus with renal manifestations ([place])   . Vitamin D deficiency     SURGICAL HISTORY: Past Surgical History:  Procedure Laterality Date  . CATARACT EXTRACTION W/PHACO Right 01/07/2019   Procedure: CATARACT EXTRACTION PHACO AND INTRAOCULAR LENS PLACEMENT (IOC) RIGHT DIABETIC 02:15.1          25.7%        34.69;  Surgeon: Birder Robson, MD;  Location: [place];  Service: Ophthalmology;  Laterality: Right;  Diabetic - oral meds  . CATARACT EXTRACTION W/PHACO Left 01/28/2019   Procedure: CATARACT EXTRACTION PHACO AND INTRAOCULAR LENS PLACEMENT (IOC) LEFT DIABETIC 12.96  01:15.5;  Surgeon: Birder Robson, MD;  Location: [place];  Service: Ophthalmology;  Laterality: Left;  Diabetic - oral meds  . COLONOSCOPY WITH PROPOFOL N/A 11/06/2016   Procedure: COLONOSCOPY WITH PROPOFOL;  Surgeon: Lucilla Lame, MD;  Location: [place];  Service: Endoscopy;  Laterality: N/A;  DIABETIC  . ESOPHAGOGASTRODUODENOSCOPY (EGD) WITH PROPOFOL N/A 11/06/2016   Procedure: ESOPHAGOGASTRODUODENOSCOPY (EGD) WITH PROPOFOL;  Surgeon: Lucilla Lame, MD;  Location: [place];  Service: Endoscopy;  Laterality: N/A;  . ESOPHAGOGASTRODUODENOSCOPY (EGD) WITH PROPOFOL N/A  01/23/2017   Procedure: ESOPHAGOGASTRODUODENOSCOPY (EGD) WITH PROPOFOL with PUSH;  Surgeon: Lucilla Lame, MD;  [facility]COPY;  Service: Endoscopy;  Laterality: N/A;  .  GIVENS CAPSULE STUDY N/A 12/22/2016   Procedure: GIVENS CAPSULE STUDY;  Surgeon: Lucilla Lame, MD;  Location: [facility] ENDOSCOPY;  Service: Endoscopy;  Laterality: N/A;  . INSERT / REPLACE / REMOVE PACEMAKER    . PACEMAKER INSERTION  2006  . POLYPECTOMY  11/06/2016   Procedure: POLYPECTOMY;  Surgeon: Lucilla Lame, MD;  Location: [place];  Service: Endoscopy;;    SOCIAL HISTORY: Social History   Socioeconomic History  . Marital status: Widowed    Spouse name: Shanon Brow  . Number of children: 1  . Years of education: 2 years of college  . Highest education level: 12th grade  Occupational History  . Occupation: Retired Agricultural engineer at [employer]  . Smoking status: Never Smoker  . Smokeless tobacco: Never Used  . Tobacco comment: smoking cessation materials not required  Substance and Sexual Activity  . Alcohol use: No    Alcohol/week: 0.0 standard drinks  . Drug use: No  . Sexual activity: Never  Other Topics Concern  . Not on file  Social History Narrative   She still has a house but her daughter and grand-daughter are staying at her house   She moved in with her older sister ( 57 years older), to help her out.    Social Determinants of Health   Financial Resource Strain:   . Difficulty of Paying Living Expenses:   Food Insecurity:   . Worried About Charity fundraiser in the Last Year:   . Arboriculturist in the Last Year:   Transportation Needs:   . Film/video editor (Medical):   Marland Kitchen Lack of Transportation (Non-Medical):   Physical Activity:   . Days of Exercise per Week:   . Minutes of Exercise per Session:   Stress:   . Feeling of Stress :   Social Connections:   . Frequency of Communication with Friends and Family:   . Frequency of Social Gatherings with Friends and Family:   . Attends Religious Services:   . Active Member of Clubs or Organizations:   . Attends Archivist Meetings:   Marland Kitchen Marital Status:   Intimate Partner  Violence:   . Fear of Current or Ex-Partner:   . Emotionally Abused:   Marland Kitchen Physically Abused:   . Sexually Abused:     FAMILY HISTORY: Family History  Problem Relation Age of Onset  . Hypertension Mother   . Stroke Mother   . Diabetes Daughter   . Stroke Father   . Healthy Sister   . Hypertension Maternal Grandmother     ALLERGIES:  is allergic to ace inhibitors.  MEDICATIONS:  Current Outpatient Medications  Medication Sig Dispense Refill  . amiodarone (PACERONE) 200 MG tablet Take 100 mg by mouth daily.    Marland Kitchen amLODipine-valsartan (EXFORGE) 10-320 MG tablet Take 1 tablet by mouth daily. 90 tablet 0  . aspirin 81 MG tablet Take 81 mg by mouth daily.    Marland Kitchen atorvastatin (LIPITOR) 20 MG tablet Take 1 tablet (20 mg total) by mouth daily. 90 tablet 1  . cholecalciferol (VITAMIN D) 1000 UNITS tablet Take 200 Units by mouth daily.    . ferrous sulfate 325 (65 FE) MG EC tablet Take 1 tablet (325 mg total) by mouth 2 (two) times daily with a meal. 180 tablet 2  .  folic acid (FOLVITE) A999333 MCG tablet Take 400 mcg by mouth daily.    . hydrochlorothiazide (HYDRODIURIL) 12.5 MG tablet TAKE 1 TABLET EVERY DAY 90 tablet 0  . metFORMIN (GLUCOPHAGE-XR) 750 MG 24 hr tablet TAKE 1 TABLET EVERY EVENING 90 tablet 1   Current Facility-Administered Medications  Medication Dose Route Frequency Provider Last Rate Last Admin  . cyanocobalamin ((VITAMIN B-12)) injection 1,000 mcg  1,000 mcg Intramuscular Q30 days Steele Sizer, MD   1,000 mcg at 07/08/18 [id number]   Facility-Administered Medications Ordered in Other Visits  Medication Dose Route Frequency Provider Last Rate Last Admin  . 0.9 %  sodium chloride infusion   Intravenous Continuous Earlie Server, MD 10 mL/hr at 06/19/18 1339 New Bag at 06/19/18 1339     PHYSICAL EXAMINATION: ECOG PERFORMANCE STATUS: 1 - Symptomatic but completely ambulatory Vitals:   06/13/19 1029  BP: (!) 148/68  Pulse: 80  Resp: 18  Temp: (!) 97 F (36.1 C)  SpO2: 100%    Filed Weights   06/13/19 1029  Weight: 112 lb 9.6 oz (51.1 kg)    Physical Exam  Constitutional: She is oriented to person, place, and time. No distress.  Thin built, she walks independently   HENT:  Head: Normocephalic and atraumatic.  Nose: Nose normal.  Mouth/Throat: Oropharynx is clear and moist. No oropharyngeal exudate.  Eyes: Pupils are equal, round, and reactive to light. EOM are normal. Left eye exhibits no discharge. No scleral icterus.  Neck: No JVD present.  Cardiovascular: Normal rate, regular rhythm and normal heart sounds. Exam reveals no friction rub.  No murmur heard. Pulmonary/Chest: Effort normal and breath sounds normal. No respiratory distress. She has no wheezes. She has no rales. She exhibits no tenderness.  Abdominal: Soft. Bowel sounds are normal. She exhibits no distension. There is no abdominal tenderness.  Musculoskeletal:        General: No tenderness or edema. Normal range of motion.     Cervical back: Normal range of motion and neck supple.  Lymphadenopathy:    She has no cervical adenopathy.  Neurological: She is alert and oriented to person, place, and time. No cranial nerve deficit. She exhibits normal muscle tone. Coordination normal.  Skin: Skin is warm and dry. No rash noted. She is not diaphoretic. No erythema.  Psychiatric: Affect and judgment normal.     LABORATORY DATA:  I have reviewed the data as listed Lab Results  Component Value Date   WBC 4.3 06/11/2019   HGB 11.4 (L) 06/11/2019   HCT 35.0 (L) 06/11/2019   MCV 91.4 06/11/2019   PLT 219 06/11/2019   No results for input(s): NA, K, CL, CO2, GLUCOSE, BUN, CREATININE, CALCIUM, GFRNONAA, GFRAA, PROT, ALBUMIN, AST, ALT, ALKPHOS, BILITOT, BILIDIR, IBILI in the last 8760 hours.  Iron/TIBC/Ferritin/ %Sat    Component Value Date/Time   IRON 82 06/11/2019 1312   TIBC 314 06/11/2019 1312   FERRITIN 166 06/11/2019 1312   IRONPCTSAT 26 06/11/2019 1312   IRONPCTSAT 15 04/11/2016 1103    SPEP not observed M spike.   ASSESSMENT & PLAN:  1. Iron deficiency anemia due to chronic blood loss   2. Anemia of chronic renal failure, stage 3a   3. History of non anemic vitamin B12 deficiency    # Iron deficiency anemia, Labs reviewed and discussed with patient.  Patient's iron panel is stable.  Ferritin is 166, iron saturation is 26. Discussed with patient that in the context of chronic kidney disease, I recommend patient to take  oral iron maintenance.  She can decrease to once a day. History of vitamin B12 deficiency, I will check her vitamin B12 level at the next visit. # Anemia of CKD, since hemoglobin is >10, no need for intervention at this point.  Orders Placed This Encounter  Procedures  . CBC with Differential/Platelet    Standing Status:   Future    Standing Expiration Date:   12/10/2020  . Comprehensive metabolic panel    Standing Status:   Future    Standing Expiration Date:   12/10/2020  . Ferritin    Standing Status:   Future    Standing Expiration Date:   12/10/2020  . Iron and TIBC    Standing Status:   Future    Standing Expiration Date:   12/10/2020   All questions were answered. The patient knows to call the clinic with any problems questions or concerns.  Return of visit: 3 months.   Earlie Server, MD, PhD Hematology Oncology [facility] at [facility] Pager- [phone] 06/13/2019

## 2019-07-03 ENCOUNTER — Telehealth: Payer: Self-pay | Admitting: Family Medicine

## 2019-07-03 NOTE — Chronic Care Management (AMB) (Signed)
  Chronic Care Management   Outreach Note  07/03/2019 Name: Emma Campbell MRN: [id number] DOB: 05/01/1932  Emma Campbell is a 86 y.o. year old female who is a primary care patient of Steele Sizer, MD. I reached out to [name] by phone today in response to a referral sent by [name]'s health plan.     An unsuccessful telephone outreach was attempted today, Pt was at an appt when called and could not talk. The patient was referred to the case management team for assistance with care management and care coordination.   Follow Up Plan: The care management team will reach out to the patient again over the next 7 days.  If patient returns call to provider office, please advise to call [facility] [place], [place], [place], [place] [address] Direct Dial: [phone] [email or web address] Website: [email or web address]

## 2019-07-07 NOTE — Chronic Care Management (AMB) (Signed)
  Chronic Care Management   Note  07/07/2019 Name: MAYMUNA DETZEL MRN: [id number] DOB: 05/05/1932  AMIREE NO is a 86 y.o. year old female who is a primary care patient of Steele Sizer, MD. I reached out to [name] by phone today in response to a referral sent by Ms. Lurline Idol [place]'s health plan.     Ms. Heinle was given information about Chronic Care Management services today including:  1. CCM service includes personalized support from designated clinical staff supervised by her physician, including individualized plan of care and coordination with other care providers 2. 24/7 contact phone numbers for assistance for urgent and routine care needs. 3. Service will only be billed when office clinical staff spend 20 minutes or more in a month to coordinate care. 4. Only one practitioner may furnish and bill the service in a calendar month. 5. The patient may stop CCM services at any time (effective at the end of the month) by phone call to the office staff. 6. The patient will be responsible for cost sharing (co-pay) of up to 20% of the service fee (after annual deductible is met).  Patient agreed to services and verbal consent obtained.   Follow up plan: Telephone appointment with care management team member scheduled for:07/31/2019  Noreene Larsson, [address], [place], [place] [address] Direct Dial: [phone] [email or web address]: [email or web address]

## 2019-07-25 ENCOUNTER — Other Ambulatory Visit: Payer: Self-pay | Admitting: Family Medicine

## 2019-07-25 DIAGNOSIS — J189 Pneumonia, unspecified organism: Secondary | ICD-10-CM

## 2019-07-31 ENCOUNTER — Ambulatory Visit: Payer: Medicare HMO | Admitting: Pharmacist

## 2019-07-31 ENCOUNTER — Other Ambulatory Visit: Payer: Self-pay

## 2019-07-31 DIAGNOSIS — E1122 Type 2 diabetes mellitus with diabetic chronic kidney disease: Secondary | ICD-10-CM

## 2019-07-31 DIAGNOSIS — I480 Paroxysmal atrial fibrillation: Secondary | ICD-10-CM

## 2019-07-31 NOTE — Chronic Care Management (AMB) (Signed)
Chronic Care Management Pharmacy  Name: Emma Campbell  MRN: [id number] DOB: 1932-08-16  Chief Complaint/ HPI  Emma Campbell,  86 y.o. , female presents for their Initial CCM visit with the clinical pharmacist via telephone due to COVID-19 Pandemic.  PCP : Steele Sizer, MD  Their chronic conditions include: Afib, HTN, HLD, DM  Office Visits: 3/11 afib, [place], BP 121/65 P 77 Wt 114, A1c 5.9%, denies hypoglycemia, no orthostasis, pacemaker 2006, amiodarone dec to 100mg  daily, [name] 2, ASA only (falls risk), osteopenia, URI lost 6 lbs, Hold HCTZ  Consult Visit: 4/16 Iron anemia, Yu, BP 148/68 P 80 Wt 112.5 BMI 21.3, Hgb 9  Medications: Outpatient Encounter Medications as of 07/31/2019  Medication Sig Note  . amiodarone (PACERONE) 200 MG tablet Take 100 mg by mouth daily. 09/22/2015: Taking half tablet  . amLODipine-valsartan (EXFORGE) 10-320 MG tablet Take 1 tablet by mouth daily.   Marland Kitchen aspirin 81 MG tablet Take 81 mg by mouth daily.   Marland Kitchen atorvastatin (LIPITOR) 20 MG tablet Take 1 tablet (20 mg total) by mouth daily.   . cholecalciferol (VITAMIN D) 1000 UNITS tablet Take 1,000 Units by mouth daily.    . ferrous sulfate 325 (65 FE) MG EC tablet Take 1 tablet (325 mg total) by mouth 2 (two) times daily with a meal. (Patient taking differently: Take 325 mg by mouth at bedtime. )   . folic acid (FOLVITE) A999333 MCG tablet Take 400 mcg by mouth daily.   . hydrochlorothiazide (HYDRODIURIL) 12.5 MG tablet TAKE 1 TABLET EVERY DAY   . metFORMIN (GLUCOPHAGE-XR) 750 MG 24 hr tablet TAKE 1 TABLET EVERY EVENING    Facility-Administered Encounter Medications as of 07/31/2019  Medication  . 0.9 %  sodium chloride infusion  . cyanocobalamin ((VITAMIN B-12)) injection 1,000 mcg      Financial Resource Strain: Medium Risk  . Difficulty of Paying Living Expenses: Somewhat hard   Current Diagnosis/Assessment:  Goals Addressed            This Visit's Progress   . Chronic Care Management       CARE  PLAN ENTRY  Current Barriers:  . Chronic Disease Management support, education, and care coordination needs related to Hypertension, Hyperlipidemia, Diabetes, and Atrial Fibrillation   Hypertension . Pharmacist Clinical Goal(s): o Over the next 90 days, patient will work with PharmD and providers to maintain BP goal <130/80 . Current regimen:  o Amlodipine-valsartan 10/320mg  daily o Hydrochlorothiazide 12.5mg  daily . Interventions: o Counseled on white coat syndrome . Patient self care activities - Over the next 90 days, patient will: o Check BP daily, document, and provide at future appointments o Ensure daily salt intake < 2300 mg/day  Hyperlipidemia . Pharmacist Clinical Goal(s): o Over the next 90 days, patient will work with PharmD and providers to achieve LDL goal < 70 . Current regimen:  o Atorvastatin 20mg  daily . Interventions: o Increase Lipitor 40mg  daily . Patient self care activities - Over the next 90 days, patient will: o Continue lifestyle modifications o Double atorvastatin until new 40mg  prescription available o Pick up and begin taking atorvastatin 40mg  daily o Stop atorvastatin 20mg   Diabetes . Pharmacist Clinical Goal(s): o Over the next 90 days, patient will work with PharmD and providers to maintain A1c goal <7% . Current regimen:  o Metformin 750mg  every evening . Interventions: o Counseled that blood sugars may change as taste/appetite returns . Patient self care activities - Over the next 90 days, patient will: o  Check blood sugar once daily, document, and provide at future appointments o Contact provider with any episodes of hypoglycemia o Once appetite returns, check blood sugar and call provider or PharmD if higher than normal  Atrial fibrillation . Pharmacist Clinical Goal(s) o Over the next 90 days, patient will work with PharmD and providers to optimize amiodarone dose . Current regimen:  o Amioadarone 200mg  1/2 tab by mouth  daily . Interventions: o Change to amiodarone 100mg  1 tab by mouth daily for simplicity . Patient self care activities - Over the next 90 days, patient will: o Report any palpitations or heart rate changes on current, reduced dose amiodarone 100mg  daily  Medication management . Pharmacist Clinical Goal(s): o Over the next 90 days, patient will work with PharmD and providers to maintain optimal medication adherence . Current pharmacy: [facility] . Interventions o Comprehensive medication review performed. o Continue current medication management strategy . Patient self care activities - Over the next 90 days, patient will: o Focus on medication adherence by continuing current practices o Take medications as prescribed o Report any questions or concerns to PharmD and/or provider(s)  Initial goal documentation       Diabetes   Recent Relevant Labs: Lab Results  Component Value Date/Time   HGBA1C 5.9 (A) 01/08/2019 12:05 PM   HGBA1C 6.4 (A) 04/19/2018 10:27 AM   HGBA1C 6.4 04/19/2018 10:27 AM   HGBA1C 6.4 04/19/2018 10:27 AM   HGBA1C 6.4 04/19/2018 10:27 AM   HGBA1C 6.1 12/17/2017 11:11 AM   HGBA1C 6.4 08/15/2017 10:23 AM   MICROALBUR 20 01/08/2019 12:05 PM   MICROALBUR 1.0 12/17/2017 11:21 AM   MICROALBUR 20 08/15/2017 10:23 AM     Recent FBG Readings: 120s - 150s Patient has failed these meds in past: NA Patient is currently controlled on the following medications: metformin  Last diabetic Foot exam:  Lab Results  Component Value Date/Time   HMDIABEYEEXA No Retinopathy 12/25/2018 12:00 AM   HMDIABEYEEXA Retinopathy (A) 12/25/2018 12:00 AM    Last diabetic Eye exam: No results found for: HMDIABFOOTEX   We discussed:   Metformin at night due to cramps Sweet binge Denies hypoglycemia Doesn't take FSBG at night Sometimes drink Sprite (140 calories, not whole) or Glucerna Still losing weight due to dental issues, just back to coffee  Plan  Continue current  medications  Hypertension   Office blood pressures are  BP Readings from Last 3 Encounters:  06/13/19 (!) 148/68  05/08/19 121/65  01/28/19 126/67     Patient has failed these meds in the past: ACEI cough  Patient checks BP at home daily  Patient home BP readings are ranging: 0000000 - 123456 systolic  We discussed   Denies hypotension White coat syndrome  Plan  Continue current medications   Hyperlipidemia   Lipid Panel     Component Value Date/Time   CHOL 197 04/19/2018 1044   CHOL 202 (H) 04/05/2015 1113   TRIG 75 04/19/2018 1044   HDL 84 04/19/2018 1044   HDL 103 04/05/2015 1113   LDLCALC 97 04/19/2018 1044     The ASCVD Risk score (Goff DC Jr., et al., 2013) failed to calculate for the following reasons:   The 2013 ASCVD risk score is only valid for ages 61 to 39   Patient has failed these meds in past: NA Patient is currently uncontrolled on the following medications:  . Lipitor 20mg  daily  We discussed:    Increase Lipitor  Plan  Increase atorvastatin 40mg  1  tab by mouth daily  AFIB   Patient is currently rhythm controlled. HR 80 BPM  Patient has failed these meds in past: NA Patient is currently controlled on the following medications: amiodarone, pacemaker  We discussed:   Takes half amiodarone  Plan  Continue current medications  Medication Management   Pt uses [facility] for all medications Uses pill box? Yes Pt endorses 100% compliance  We discussed:    Taste just now returning Recommended lean white meat Vitamin D 1000 FeSO4 once at night Can't tolerate MVI, vomiting   Plan  Continue current medication management strategy  Follow up: 3 month phone visit   Milus Height, PharmD, [place], [facility] [phone]

## 2019-08-02 NOTE — Patient Instructions (Addendum)
Visit Information  Goals Addressed            This Visit's Progress   . Chronic Care Management       CARE PLAN ENTRY  Current Barriers:  . Chronic Disease Management support, education, and care coordination needs related to Hypertension, Hyperlipidemia, Diabetes, and Atrial Fibrillation   Hypertension . Pharmacist Clinical Goal(s): o Over the next 90 days, patient will work with PharmD and providers to maintain BP goal <130/80 . Current regimen:  o Amlodipine-valsartan 10/320mg  daily o Hydrochlorothiazide 12.5mg  daily . Interventions: o Counseled on white coat syndrome . Patient self care activities - Over the next 90 days, patient will: o Check BP daily, document, and provide at future appointments o Ensure daily salt intake < 2300 mg/day  Hyperlipidemia . Pharmacist Clinical Goal(s): o Over the next 90 days, patient will work with PharmD and providers to achieve LDL goal < 70 . Current regimen:  o Atorvastatin 20mg  daily . Interventions: o Increase Lipitor 40mg  daily . Patient self care activities - Over the next 90 days, patient will: o Continue lifestyle modifications o Double atorvastatin until new 40mg  prescription available o Pick up and begin taking atorvastatin 40mg  daily o Stop atorvastatin 20mg   Diabetes . Pharmacist Clinical Goal(s): o Over the next 90 days, patient will work with PharmD and providers to maintain A1c goal <7% . Current regimen:  o Metformin 750mg  every evening . Interventions: o Counseled that blood sugars may change as taste/appetite returns . Patient self care activities - Over the next 90 days, patient will: o Check blood sugar once daily, document, and provide at future appointments o Contact provider with any episodes of hypoglycemia o Once appetite returns, check blood sugar and call provider or PharmD if higher than normal  Atrial fibrillation . Pharmacist Clinical Goal(s) o Over the next 90 days, patient will work with PharmD  and providers to optimize amiodarone dose . Current regimen:  o Amioadarone 200mg  1/2 tab by mouth daily . Interventions: o Change to amiodarone 100mg  1 tab by mouth daily for simplicity . Patient self care activities - Over the next 90 days, patient will: o Report any palpitations or heart rate changes on current, reduced dose amiodarone 100mg  daily  Medication management . Pharmacist Clinical Goal(s): o Over the next 90 days, patient will work with PharmD and providers to maintain optimal medication adherence . Current pharmacy: [facility] . Interventions o Comprehensive medication review performed. o Continue current medication management strategy . Patient self care activities - Over the next 90 days, patient will: o Focus on medication adherence by continuing current practices o Take medications as prescribed o Report any questions or concerns to PharmD and/or provider(s)  Initial goal documentation        Emma Campbell was given information about Chronic Care Management services today including:  1. CCM service includes personalized support from designated clinical staff supervised by her physician, including individualized plan of care and coordination with other care providers 2. 24/7 contact phone numbers for assistance for urgent and routine care needs. 3. Standard insurance, coinsurance, copays and deductibles apply for chronic care management only during months in which we provide at least 20 minutes of these services. Most insurances cover these services at 100%, however patients may be responsible for any copay, coinsurance and/or deductible if applicable. This service may help you avoid the need for more expensive face-to-face services. 4. Only one practitioner may furnish and bill the service in a calendar month. 5. The patient  may stop CCM services at any time (effective at the end of the month) by phone call to the office staff.  Patient agreed to services and verbal consent  obtained.   Print copy of patient instructions provided.  Telephone follow up appointment with pharmacy team member scheduled for: 3 months  Milus Height, PharmD, [place], [facility] [phone]  Dyslipidemia Dyslipidemia is an imbalance of waxy, fat-like substances (lipids) in the blood. The body needs lipids in small amounts. Dyslipidemia often involves a high level of cholesterol or triglycerides, which are types of lipids. Common forms of dyslipidemia include:  High levels of LDL cholesterol. LDL is the type of cholesterol that causes fatty deposits (plaques) to build up in the blood vessels that carry blood away from your heart (arteries).  Low levels of HDL cholesterol. HDL cholesterol is the type of cholesterol that protects against heart disease. High levels of HDL remove the LDL buildup from arteries.  High levels of triglycerides. Triglycerides are a fatty substance in the blood that is linked to a buildup of plaques in the arteries. What are the causes? Primary dyslipidemia is caused by changes (mutations) in genes that are passed down through families (inherited). These mutations cause several types of dyslipidemia. Secondary dyslipidemia is caused by lifestyle choices and diseases that lead to dyslipidemia, such as:  Eating a diet that is high in animal fat.  Not getting enough exercise.  Having diabetes, kidney disease, liver disease, or thyroid disease.  Drinking large amounts of alcohol.  Using certain medicines. What increases the risk? You are more likely to develop this condition if you are an older man or if you are a woman who has gone through menopause. Other risk factors include:  Having a family history of dyslipidemia.  Taking certain medicines, including birth control pills, steroids, some diuretics, and beta-blockers.  Smoking cigarettes.  Eating a high-fat diet.  Having certain medical conditions such as  diabetes, polycystic ovary syndrome (PCOS), kidney disease, liver disease, or hypothyroidism.  Not exercising regularly.  Being overweight or obese with too much belly fat. What are the signs or symptoms? In most cases, dyslipidemia does not usually cause any symptoms. In severe cases, very high lipid levels can cause:  Fatty bumps under the skin (xanthomas).  White or gray ring around the black center (pupil) of the eye. Very high triglyceride levels can cause inflammation of the pancreas (pancreatitis). How is this diagnosed? Your health care provider may diagnose dyslipidemia based on a routine blood test (fasting blood test). Because most people do not have symptoms of the condition, this blood testing (lipid profile) is done on adults age 80 and older and is repeated every 5 years. This test checks:  Total cholesterol. This measures the total amount of cholesterol in your blood, including LDL cholesterol, HDL cholesterol, and triglycerides. A healthy number is below 200.  LDL cholesterol. The target number for LDL cholesterol is different for each person, depending on individual risk factors. Ask your health care provider what your LDL cholesterol should be.  HDL cholesterol. An HDL level of 60 or higher is best because it helps to protect against heart disease. A number below 49 for men or below 63 for women increases the risk for heart disease.  Triglycerides. A healthy triglyceride number is below 150. If your lipid profile is abnormal, your health care provider may do other blood tests. How is this treated? Treatment depends on the type of dyslipidemia that you have and  your other risk factors for heart disease and stroke. Your health care provider will have a target range for your lipid levels based on this information. For many people, this condition may be treated by lifestyle changes, such as diet and exercise. Your health care provider may recommend that you:  Get regular  exercise.  Make changes to your diet.  Quit smoking if you smoke. If diet changes and exercise do not help you reach your goals, your health care provider may also prescribe medicine to lower lipids. The most commonly prescribed type of medicine lowers your LDL cholesterol (statin drug). If you have a high triglyceride level, your provider may prescribe another type of drug (fibrate) or an omega-3 fish oil supplement, or both. Follow these instructions at home:  Eating and drinking  Follow instructions from your health care provider or dietitian about eating or drinking restrictions.  Eat a healthy diet as told by your health care provider. This can help you reach and maintain a healthy weight, lower your LDL cholesterol, and raise your HDL cholesterol. This may include: ? Limiting your calories, if you are overweight. ? Eating more fruits, vegetables, whole grains, fish, and lean meats. ? Limiting saturated fat, trans fat, and cholesterol.  If you drink alcohol: ? Limit how much you use. ? Be aware of how much alcohol is in your drink. In the [place] one drink equals one 12 oz bottle of beer (355 mL), one 5 oz glass of wine (148 mL), or one 1 oz glass of hard liquor (44 mL).  Do not drink alcohol if: ? Your health care provider tells you not to drink. ? You are pregnant, may be pregnant, or are planning to become pregnant. Activity  Get regular exercise. Start an exercise and strength training program as told by your health care provider. Ask your health care provider what activities are safe for you. Your health care provider may recommend: ? 30 minutes of aerobic activity 4-6 days a week. Brisk walking is an example of aerobic activity. ? Strength training 2 days a week. General instructions  Do not use any products that contain nicotine or tobacco, such as cigarettes, e-cigarettes, and chewing tobacco. If you need help quitting, ask your health care provider.  Take  over-the-counter and prescription medicines only as told by your health care provider. This includes supplements.  Keep all follow-up visits as told by your health care provider. Contact a health care provider if:  You are: ? Having trouble sticking to your exercise or diet plan. ? Struggling to quit smoking or control your use of alcohol. Summary  Dyslipidemia often involves a high level of cholesterol or triglycerides, which are types of lipids.  Treatment depends on the type of dyslipidemia that you have and your other risk factors for heart disease and stroke.  For many people, treatment starts with lifestyle changes, such as diet and exercise.  Your health care provider may prescribe medicine to lower lipids. This information is not intended to replace advice given to you by your health care provider. Make sure you discuss any questions you have with your health care provider. Document Revised: 10/08/2017 Document Reviewed: 09/14/2017 Elsevier Patient Education  [place].

## 2019-08-04 ENCOUNTER — Other Ambulatory Visit: Payer: Self-pay | Admitting: Family Medicine

## 2019-08-04 DIAGNOSIS — E785 Hyperlipidemia, unspecified: Secondary | ICD-10-CM

## 2019-08-04 MED ORDER — ATORVASTATIN CALCIUM 40 MG PO TABS: 40.0000 mg | ORAL_TABLET | Freq: Every day | ORAL | 1 refills | Status: DC

## 2019-08-12 ENCOUNTER — Encounter: Payer: Self-pay | Admitting: Family Medicine

## 2019-08-12 ENCOUNTER — Ambulatory Visit (INDEPENDENT_AMBULATORY_CARE_PROVIDER_SITE_OTHER): Payer: Medicare HMO | Admitting: Family Medicine

## 2019-08-12 ENCOUNTER — Other Ambulatory Visit: Payer: Self-pay

## 2019-08-12 VITALS — BP 110/60 | HR 93 | Temp 98.3°F | Resp 14 | Ht 61.0 in | Wt 109.4 lb

## 2019-08-12 DIAGNOSIS — R7989 Other specified abnormal findings of blood chemistry: Secondary | ICD-10-CM | POA: Diagnosis not present

## 2019-08-12 DIAGNOSIS — D500 Iron deficiency anemia secondary to blood loss (chronic): Secondary | ICD-10-CM | POA: Diagnosis not present

## 2019-08-12 DIAGNOSIS — I129 Hypertensive chronic kidney disease with stage 1 through stage 4 chronic kidney disease, or unspecified chronic kidney disease: Secondary | ICD-10-CM

## 2019-08-12 DIAGNOSIS — E538 Deficiency of other specified B group vitamins: Secondary | ICD-10-CM | POA: Diagnosis not present

## 2019-08-12 DIAGNOSIS — I495 Sick sinus syndrome: Secondary | ICD-10-CM

## 2019-08-12 DIAGNOSIS — I10 Essential (primary) hypertension: Secondary | ICD-10-CM

## 2019-08-12 DIAGNOSIS — E559 Vitamin D deficiency, unspecified: Secondary | ICD-10-CM | POA: Diagnosis not present

## 2019-08-12 DIAGNOSIS — Z950 Presence of cardiac pacemaker: Secondary | ICD-10-CM

## 2019-08-12 DIAGNOSIS — E785 Hyperlipidemia, unspecified: Secondary | ICD-10-CM | POA: Diagnosis not present

## 2019-08-12 DIAGNOSIS — E1122 Type 2 diabetes mellitus with diabetic chronic kidney disease: Secondary | ICD-10-CM | POA: Diagnosis not present

## 2019-08-12 DIAGNOSIS — E441 Mild protein-calorie malnutrition: Secondary | ICD-10-CM

## 2019-08-12 DIAGNOSIS — N183 Chronic kidney disease, stage 3 unspecified: Secondary | ICD-10-CM

## 2019-08-12 DIAGNOSIS — N182 Chronic kidney disease, stage 2 (mild): Secondary | ICD-10-CM | POA: Diagnosis not present

## 2019-08-12 DIAGNOSIS — I480 Paroxysmal atrial fibrillation: Secondary | ICD-10-CM | POA: Diagnosis not present

## 2019-08-12 NOTE — Progress Notes (Signed)
Name: Emma Campbell   MRN: [id number]    DOB: 18-Apr-1932   Date:08/12/2019       Progress Note  Subjective  Chief Complaint  Chief Complaint  Patient presents with  . Follow-up  . Diabetes  . Hypertension    HPI   DMII with renal manifestation: CKI and microalbuminuria. HgbA1C was6.4%02/2020last A1C was down to  5.9%She denies hypoglycemia episodes.  Glucose at home is getting checked a couple of times weekly , values in the 100-120's She denies polyphagia, polydipsia or polyuria, she stopped taking hctz as recommended and still goes to the bathroom - nocturia at most once per night. Continue ARB for kidney protection.Eye exam is up to date. She is on statin therapy for dyslipidemia.   HTN: taking medicationdaily and bp is at goal, no dizziness, chest pain or palpitation.BP has been 120-130's/60-70's  She is off HCTZ for the past week, at home bp was 130/68 , in our office bp is still towards low end of normal, she has lost a lot of weight.   Hyperlipidemia:shehas been taking Rosuvastatin daily and denies side effects at this time, we will recheck labs today   Sick Sinus Syndrome: she had a pacemaker placed in 2006and has been onAmiodarone since 2006, she is now down to 100 mg of Amiodarone daily, last TSH was back to normal - we will recheck it today now No chest pain or palpitation.Sees Dr. Clayborn Bigness, last visit02/2021 , she is [name] score of 2, and is on aspirin onlyper his recommendation. She also has a history of Afib that has been rate controlled. She also has a history of AVM's and not good candidate for blood thinners   Vitamin D deficiency: still taking supplementation, denies fatigue,last Vitamin D we will recheck it today   Osteopenia:last FRAX showed osteopenia, not taking Alendronate, she will schedule her bone density today   CKI stage III: last GFR at 58, she avoids nsaid's and we will recheck labs. She denies pruritis.   Anemia: iron deficiency  and positive hemoccult, seen by Dr. Durwin Reges andhadEGD and colonoscopy September 2018,diagnosed with AVM small bowel and had it cauterized,hemoglobin dropped and was referred to Dr. Tasia Catchings, labs are being monitored by her, last HCTwas was 11.4 and is taking only one iron pill daily . No recent iron infusion   B12 deficiency:  she is seeing Dr. Tasia Catchings and has been off supplementation  Malnourished: explained that she lost 16 lbs in the previous year ( she was sick with URI - likely COVID-9 )  She is now taking glucerna shakes and had gained  6 lbs from Nov to Feb 2021, however had five teeth removed and partials placed and has a hard time eating since May 24 th, 2021 Discussed importance of resuming regular meals even if not hungry, she needs to gain weight. Offered to check inflammatory markers but she states she knows what is causing her to lose weight.   Patient Active Problem List   Diagnosis Date Noted  . B12 deficiency 05/21/2017  . Gastrointestinal tract imaging abnormality   . Iron deficiency anemia   . Polyp of sigmoid colon   . Type 2 diabetes mellitus with stage 2 chronic kidney disease, without long-term current use of insulin ([place]) 08/03/2015  . Arthritis, degenerative 05/03/2015  . Menopausal and perimenopausal disorder 05/03/2015  . Chronic kidney disease (CKD), stage III (moderate) 11/30/2014  . High risk medication use 11/30/2014  . Diverticulosis 11/30/2014  . Fibrocystic breast disease 11/30/2014  .  Elevated LFTs 11/30/2014  . Systolic ejection murmur 88/32/5498  . Paroxysmal A-fib ([place]) 11/30/2014  . Microalbuminuria 07/31/2014  . Elevated TSH 07/31/2014  . History of cardiac pacemaker 07/31/2014  . Sick sinus syndrome ([place]) 08/13/2013  . Hypercholesteremia 08/13/2013  . Hypertension, benign 08/13/2013  . Diabetes mellitus with renal manifestations, controlled ([place]) 08/13/2013  . Avitaminosis D 04/12/2009    Past Surgical History:  Procedure Laterality Date  . CATARACT  EXTRACTION W/PHACO Right 01/07/2019   Procedure: CATARACT EXTRACTION PHACO AND INTRAOCULAR LENS PLACEMENT (IOC) RIGHT DIABETIC 02:15.1          25.7%        34.69;  Surgeon: Birder Robson, MD;  Location: [place];  Service: Ophthalmology;  Laterality: Right;  Diabetic - oral meds  . CATARACT EXTRACTION W/PHACO Left 01/28/2019   Procedure: CATARACT EXTRACTION PHACO AND INTRAOCULAR LENS PLACEMENT (IOC) LEFT DIABETIC 12.96  01:15.5;  Surgeon: Birder Robson, MD;  Location: [place];  Service: Ophthalmology;  Laterality: Left;  Diabetic - oral meds  . COLONOSCOPY WITH PROPOFOL N/A 11/06/2016   Procedure: COLONOSCOPY WITH PROPOFOL;  Surgeon: Lucilla Lame, MD;  Location: [place];  Service: Endoscopy;  Laterality: N/A;  DIABETIC  . ESOPHAGOGASTRODUODENOSCOPY (EGD) WITH PROPOFOL N/A 11/06/2016   Procedure: ESOPHAGOGASTRODUODENOSCOPY (EGD) WITH PROPOFOL;  Surgeon: Lucilla Lame, MD;  Location: [facility];  Service: Endoscopy;  Laterality: N/A;  . ESOPHAGOGASTRODUODENOSCOPY (EGD) WITH PROPOFOL N/A 01/23/2017   Procedure: ESOPHAGOGASTRODUODENOSCOPY (EGD) WITH PROPOFOL with PUSH;  Surgeon: Lucilla Lame, MD;  Location: [facility] ENDOSCOPY;  Service: Endoscopy;  Laterality: N/A;  . GIVENS CAPSULE STUDY N/A 12/22/2016   Procedure: GIVENS CAPSULE STUDY;  Surgeon: Lucilla Lame, MD;  Location[facility] ENDOSCOPY;  Service: Endoscopy;  Laterality: N/A;  . INSERT / REPLACE / REMOVE PACEMAKER    . PACEMAKER INSERTION  2006  . POLYPECTOMY  11/06/2016   Procedure: POLYPECTOMY;  Surgeon: Lucilla Lame, MD;  Location: [place];  Service: Endoscopy;;    Family History  Problem Relation Age of Onset  . Hypertension Mother   . Stroke Mother   . Diabetes Daughter   . Stroke Father   . Healthy Sister   . Hypertension Maternal Grandmother     Social History   Tobacco Use  . Smoking status: Never Smoker  . Smokeless tobacco: Never Used  . Tobacco comment: smoking  cessation materials not required  Substance Use Topics  . Alcohol use: No    Alcohol/week: 0.0 standard drinks     Current Outpatient Medications:  .  amiodarone (PACERONE) 200 MG tablet, Take 100 mg by mouth daily., Disp: , Rfl:  .  amLODipine-valsartan (EXFORGE) 10-320 MG tablet, Take 1 tablet by mouth daily., Disp: 90 tablet, Rfl: 0 .  amoxicillin (AMOXIL) 500 MG capsule, , Disp: , Rfl:  .  aspirin 81 MG tablet, Take 81 mg by mouth daily., Disp: , Rfl:  .  atorvastatin (LIPITOR) 40 MG tablet, Take 1 tablet (40 mg total) by mouth daily., Disp: 90 tablet, Rfl: 1 .  chlorhexidine (PERIDEX) 0.12 % solution, , Disp: , Rfl:  .  cholecalciferol (VITAMIN D) 1000 UNITS tablet, Take 1,000 Units by mouth daily. , Disp: , Rfl:  .  ferrous sulfate 325 (65 FE) MG EC tablet, Take 1 tablet (325 mg total) by mouth 2 (two) times daily with a meal. (Patient taking differently: Take 325 mg by mouth at bedtime. ), Disp: 180 tablet, Rfl: 2 .  folic acid (FOLVITE) 264 MCG tablet, Take 400  mcg by mouth daily., Disp: , Rfl:  .  hydrochlorothiazide (HYDRODIURIL) 12.5 MG tablet, TAKE 1 TABLET EVERY DAY, Disp: 90 tablet, Rfl: 0 .  ibuprofen (ADVIL) 800 MG tablet, , Disp: , Rfl:  .  metFORMIN (GLUCOPHAGE-XR) 750 MG 24 hr tablet, TAKE 1 TABLET EVERY EVENING, Disp: 90 tablet, Rfl: 1  Current Facility-Administered Medications:  .  cyanocobalamin ((VITAMIN B-12)) injection 1,000 mcg, 1,000 mcg, Intramuscular, Q30 days, Steele Sizer, MD, 1,000 mcg at 07/08/18 1694  Facility-Administered Medications Ordered in Other Visits:  .  0.9 %  sodium chloride infusion, , Intravenous, Continuous, Earlie Server, MD, Last Rate: 10 mL/hr at 06/19/18 1339, New Bag at 06/19/18 1339  Allergies  Allergen Reactions  . Ace Inhibitors Other (See Comments)    Pt unable to recall. Was advised to avoid    I personally reviewed active problem list, medication list, allergies, family history, social history, health maintenance with the  patient/caregiver today.   ROS  Constitutional: Negative for fever , positive weight change.  Respiratory: Negative for cough and shortness of breath.   Cardiovascular: Negative for chest pain or palpitations.  Gastrointestinal: Negative for abdominal pain, no bowel changes.  Musculoskeletal: Negative for gait problem or joint swelling.  Skin: Negative for rash.  Neurological: Negative for dizziness or headache.  No other specific complaints in a complete review of systems (except as listed in HPI above).  Objective  Vitals:   08/12/19 0902  BP: 110/60  Pulse: 93  Resp: 14  Temp: 98.3 F (36.8 C)  TempSrc: Temporal  SpO2: 96%  Weight: 109 lb 6.4 oz (49.6 kg)  Height: 5' 1"  (1.549 m)    Body mass index is 20.67 kg/m.  Physical Exam  Constitutional: Patient appears well-developed and well-nourished.  No distress.  HEENT: head atraumatic, normocephalic, pupils equal and reactive to light,  neck supple, Cardiovascular: Normal rate, regular rhythm and normal heart sounds.  3-6 systolic ejection  murmur heard.No BLE edema. Pulmonary/Chest: Effort normal and breath sounds normal. No respiratory distress. Abdominal: Soft.  There is no tenderness. Psychiatric: Patient has a normal mood and affect. behavior is normal. Judgment and thought content normal.  Recent Results (from the past 2160 hour(s))  Retic Panel     Status: None   Collection Time: 06/11/19  1:12 PM  Result Value Ref Range   Retic Ct Pct 1.5 0.4 - 3.1 %   RBC. 3.87 3.87 - 5.11 MIL/uL   Retic Count, Absolute 56.9 19.0 - 186.0 K/uL   Immature Retic Fract 5.2 2.3 - 15.9 %   Reticulocyte Hemoglobin 32.9 >27.9 pg    Comment:        Given the high negative predictive value of a RET-He result > 32 pg iron deficiency is essentially excluded. If this patient is anemic other etiologies should be considered. Performed at [facility], [address] [place], [place] [address]   Iron and TIBC     Status:  None   Collection Time: 06/11/19  1:12 PM  Result Value Ref Range   Iron 82 28 - 170 ug/dL   TIBC 314 250 - 450 ug/dL   Saturation Ratios 26 10.4 - 31.8 %   UIBC 232 ug/dL    Comment: Performed at [facility], [address] [place], [place] [address]  Ferritin     Status: None   Collection Time: 06/11/19  1:12 PM  Result Value Ref Range   Ferritin 166 11 - 307 ng/mL    Comment: Performed  at [facility], [address] [place], [place] [address]  CBC with Differential/Platelet     Status: Abnormal   Collection Time: 06/11/19  1:12 PM  Result Value Ref Range   WBC 4.3 4.0 - 10.5 K/uL   RBC 3.83 (L) 3.87 - 5.11 MIL/uL   Hemoglobin 11.4 (L) 12.0 - 15.0 g/dL   HCT 35.0 (L) 36 - 46 %   MCV 91.4 80.0 - 100.0 fL   MCH 29.8 26.0 - 34.0 pg   MCHC 32.6 30.0 - 36.0 g/dL   RDW 12.4 11.5 - 15.5 %   Platelets 219 150 - 400 K/uL   nRBC 0.0 0.0 - 0.2 %   Neutrophils Relative % 47 %   Neutro Abs 2.0 1.7 - 7.7 K/uL   Lymphocytes Relative 41 %   Lymphs Abs 1.7 0.7 - 4.0 K/uL   Monocytes Relative 8 %   Monocytes Absolute 0.4 0 - 1 K/uL   Eosinophils Relative 3 %   Eosinophils Absolute 0.1 0 - 0 K/uL   Basophils Relative 1 %   Basophils Absolute 0.1 0 - 0 K/uL   Immature Granulocytes 0 %   Abs Immature Granulocytes 0.01 0.00 - 0.07 K/uL    Comment: Performed at [facility], [address] [place], [place] [address]     PHQ2/9: Depression screen [facility] 2/9 08/12/2019 05/08/2019 01/08/2019 09/20/2018 09/10/2018  Decreased Interest 0 0 0 0 0  Down, Depressed, Hopeless 0 0 0 0 0  PHQ - 2 Score 0 0 0 0 0  Altered sleeping 0 0 0 0 -  Tired, decreased energy 0 0 0 0 -  Change in appetite 0 0 0 0 -  Feeling bad or failure about yourself  0 0 0 0 -  Trouble concentrating 0 0 0 0 -  Moving slowly or fidgety/restless 0 0 0 0 -  Suicidal thoughts 0 0 0 0 -  PHQ-9 Score 0 0 0 0 -  Difficult doing work/chores - Not difficult at all Not difficult at all Not difficult at  all -  Some recent data might be hidden    phq 9 is negative   Fall Risk: Fall Risk  08/12/2019 05/08/2019 01/08/2019 09/20/2018 09/10/2018  Falls in the past year? 0 0 0 0 0  Number falls in past yr: 0 0 0 0 0  Injury with Fall? - 0 0 0 0  Comment - - - - -  Risk for fall due to : - - - - -  Risk for fall due to: Comment - - - - -  Follow up - - - - Falls prevention discussed     Functional Status Survey: Is the patient deaf or have difficulty hearing?: No Does the patient have difficulty seeing, even when wearing glasses/contacts?: No Does the patient have difficulty concentrating, remembering, or making decisions?: No Does the patient have difficulty walking or climbing stairs?: No Does the patient have difficulty dressing or bathing?: No Does the patient have difficulty doing errands alone such as visiting a doctor's office or shopping?: No    Assessment & Plan  1. Type 2 diabetes mellitus with stage 2 chronic kidney disease, without long-term current use of insulin (HCC)  - Microalbumin / creatinine urine ratio - COMPLETE METABOLIC PANEL WITH GFR - Hemoglobin A1c  2. Paroxysmal A-fib (HCC)  Rate controlled   3. Dyslipidemia  - Lipid panel  4. Hypertension, benign  Towards low end of normal.   5.  Sick sinus syndrome [facility])  She has a pacemaker   6. Hypertension associated with stage 3 chronic kidney disease due to type 2 diabetes mellitus ([place])  At goal, recheck labs   7. Mild protein-calorie malnutrition ([place])  Discussed importance of eating a high calorie diet   8. Iron deficiency anemia due to chronic blood loss  Seeing Dr. Tasia Catchings, reviewed labs  9. Pacemaker   10. B12 deficiency  Advised otc supplements a few times a week   11. Vitamin D deficiency  - VITAMIN D 25 Hydroxy (Vit-D Deficiency, Fractures)  12. Elevated TSH  - TSH

## 2019-08-13 LAB — COMPLETE METABOLIC PANEL WITH GFR
AG Ratio: 2 (calc) (ref 1.0–2.5)
ALT: 15 U/L (ref 6–29)
AST: 17 U/L (ref 10–35)
Albumin: 4.5 g/dL (ref 3.6–5.1)
Alkaline phosphatase (APISO): 54 U/L (ref 37–153)
BUN/Creatinine Ratio: 10 (calc) (ref 6–22)
BUN: 9 mg/dL (ref 7–25)
CO2: 26 mmol/L (ref 20–32)
Calcium: 9.3 mg/dL (ref 8.6–10.4)
Chloride: 105 mmol/L (ref 98–110)
Creat: 0.94 mg/dL — ABNORMAL HIGH (ref 0.60–0.88)
GFR, Est African American: 64 mL/min/{1.73_m2} (ref >=60)
GFR, Est Non African American: 55 mL/min/{1.73_m2} — ABNORMAL LOW (ref >=60)
Globulin: 2.3 g/dL (calc) (ref 1.9–3.7)
Glucose, Bld: 108 mg/dL — ABNORMAL HIGH (ref 65–99)
Potassium: 4.2 mmol/L (ref 3.5–5.3)
Sodium: 140 mmol/L (ref 135–146)
Total Bilirubin: 0.6 mg/dL (ref 0.2–1.2)
Total Protein: 6.8 g/dL (ref 6.1–8.1)

## 2019-08-13 LAB — MICROALBUMIN / CREATININE URINE RATIO
Creatinine, Urine: 109 mg/dL (ref 20–275)
Microalb Creat Ratio: 10 mcg/mg creat (ref <30)
Microalb, Ur: 1.1 mg/dL

## 2019-08-13 LAB — LIPID PANEL
Cholesterol: 183 mg/dL (ref <200)
HDL: 76 mg/dL (ref >=50)
LDL Cholesterol (Calc): 91 mg/dL (calc)
Non-HDL Cholesterol (Calc): 107 mg/dL (calc) (ref <130)
Total CHOL/HDL Ratio: 2.4 (calc) (ref <5.0)
Triglycerides: 69 mg/dL (ref <150)

## 2019-08-13 LAB — HEMOGLOBIN A1C
Hgb A1c MFr Bld: 5.9 % of total Hgb — ABNORMAL HIGH (ref <5.7)
Mean Plasma Glucose: 123 (calc)
eAG (mmol/L): 6.8 (calc)

## 2019-08-13 LAB — VITAMIN D 25 HYDROXY (VIT D DEFICIENCY, FRACTURES): Vit D, 25-Hydroxy: 41 ng/mL (ref 30–100)

## 2019-08-13 LAB — TSH: TSH: 3.54 mIU/L (ref 0.40–4.50)

## 2019-09-11 ENCOUNTER — Ambulatory Visit (INDEPENDENT_AMBULATORY_CARE_PROVIDER_SITE_OTHER): Payer: Medicare Other

## 2019-09-11 DIAGNOSIS — Z0000 Encounter for general adult medical examination without abnormal findings: Secondary | ICD-10-CM

## 2019-09-11 NOTE — Progress Notes (Signed)
Subjective:   Emma Campbell is a 86 y.o. female who presents for Medicare Annual (Subsequent) preventive examination.  Virtual Visit via Telephone Note  I connected with  CHRYSTEN WOULFE on 09/11/19 at 11:20 AM EDT by telephone and verified that I am speaking with the correct person using two identifiers.  Medicare Annual Wellness visit completed telephonically due to Covid-19 pandemic.   Location: Patient: home Provider: office   I discussed the limitations, risks, security and privacy concerns of performing an evaluation and management service by telephone and the availability of in person appointments. The patient expressed understanding and agreed to proceed.  Unable to perform video visit due to video visit attempted and failed and/or patient does not have video capability.   Some vital signs may be absent or patient reported.   Clemetine Marker, LPN    Review of Systems     Cardiac Risk Factors include: advanced age (>44men, >51 women);diabetes mellitus;dyslipidemia;hypertension     Objective:    There were no vitals filed for this visit. There is no height or weight on file to calculate BMI.  Advanced Directives 09/11/2019 06/13/2019 01/28/2019 01/07/2019 12/13/2018 09/10/2018 07/09/2018  Does Patient Have a Medical Advance Directive? Yes No Yes No No No No  Type of Paramedic of [place];Living will - [place];Living will - - - -  Does patient want to make changes to medical advance directive? - - No - Patient declined - - - -  Copy of [place] in Chart? No - copy requested - No - copy requested - - - -  Would patient like information on creating a medical advance directive? - - - Yes (MAU/Ambulatory/Procedural Areas - Information given) - Yes (MAU/Ambulatory/Procedural Areas - Information given) -    Current Medications (verified) Outpatient Encounter Medications as of 09/11/2019  Medication Sig  . amiodarone  (PACERONE) 200 MG tablet Take 100 mg by mouth daily.  Marland Kitchen amLODipine-valsartan (EXFORGE) 10-320 MG tablet Take 1 tablet by mouth daily.  Marland Kitchen aspirin 81 MG tablet Take 81 mg by mouth daily.  Marland Kitchen atorvastatin (LIPITOR) 40 MG tablet Take 1 tablet (40 mg total) by mouth daily.  . cholecalciferol (VITAMIN D) 1000 UNITS tablet Take 1,000 Units by mouth daily.   . ferrous sulfate 325 (65 FE) MG EC tablet Take 1 tablet (325 mg total) by mouth 2 (two) times daily with a meal. (Patient taking differently: Take 325 mg by mouth at bedtime. )  . folic acid (FOLVITE) 518 MCG tablet Take 400 mcg by mouth daily.  . hydrochlorothiazide (HYDRODIURIL) 12.5 MG tablet TAKE 1 TABLET EVERY DAY  . metFORMIN (GLUCOPHAGE-XR) 750 MG 24 hr tablet TAKE 1 TABLET EVERY EVENING  . vitamin B-12 (CYANOCOBALAMIN) 1000 MCG tablet Take 1,000 mcg by mouth 3 (three) times a week.  . [DISCONTINUED] amoxicillin (AMOXIL) 500 MG capsule   . [DISCONTINUED] chlorhexidine (PERIDEX) 0.12 % solution   . [DISCONTINUED] ibuprofen (ADVIL) 800 MG tablet    Facility-Administered Encounter Medications as of 09/11/2019  Medication  . 0.9 %  sodium chloride infusion  . cyanocobalamin ((VITAMIN B-12)) injection 1,000 mcg    Allergies (verified) Ace inhibitors   History: Past Medical History:  Diagnosis Date  . Anemia   . Chronic kidney disease, stage III (moderate)   . Diverticulosis   . Elevated LFTs   . Essential hypertension, malignant   . Fibrocystic breast disease   . Heart murmur   . Hyperlipidemia   .  Menopausal problem   . Osteoarthrosis   . Osteoporosis   . Peripheral autonomic neuropathy   . Presence of permanent cardiac pacemaker   . Sick sinus syndrome (HCC)    Dr. Alfredo Batty  . Type II diabetes mellitus with renal manifestations ([place])   . Vitamin D deficiency    Past Surgical History:  Procedure Laterality Date  . CATARACT EXTRACTION W/PHACO Right 01/07/2019   Procedure: CATARACT EXTRACTION PHACO AND INTRAOCULAR  LENS PLACEMENT (IOC) RIGHT DIABETIC 02:15.1          25.7%        34.69;  Surgeon: Birder Robson, MD;  Location: [place];  Service: Ophthalmology;  Laterality: Right;  Diabetic - oral meds  . CATARACT EXTRACTION W/PHACO Left 01/28/2019   Procedure: CATARACT EXTRACTION PHACO AND INTRAOCULAR LENS PLACEMENT (IOC) LEFT DIABETIC 12.96  01:15.5;  Surgeon: Birder Robson, MD;  Location: [place];  Service: Ophthalmology;  Laterality: Left;  Diabetic - oral meds  . COLONOSCOPY WITH PROPOFOL N/A 11/06/2016   Procedure: COLONOSCOPY WITH PROPOFOL;  Surgeon: Lucilla Lame, MD;  Location: [place];  Service: Endoscopy;  Laterality: N/A;  DIABETIC  . ESOPHAGOGASTRODUODENOSCOPY (EGD) WITH PROPOFOL N/A 11/06/2016   Procedure: ESOPHAGOGASTRODUODENOSCOPY (EGD) WITH PROPOFOL;  Surgeon: Lucilla Lame, MD;  Location: [place];  Service: Endoscopy;  Laterality: N/A;  . ESOPHAGOGASTRODUODENOSCOPY (EGD) WITH PROPOFOL N/A 01/23/2017   Procedure: ESOPHAGOGASTRODUODENOSCOPY (EGD) WITH PROPOFOL with PUSH;  Surgeon: Lucilla Lame, MD;  Location: [facility] ENDOSCOPY;  Service: Endoscopy;  Laterality: N/A;  . GIVENS CAPSULE STUDY N/A 12/22/2016   Procedure: GIVENS CAPSULE STUDY;  Surgeon: Lucilla Lame, MD;  Location[facility] ENDOSCOPY;  Service: Endoscopy;  Laterality: N/A;  . INSERT / REPLACE / REMOVE PACEMAKER    . PACEMAKER INSERTION  2006  . POLYPECTOMY  11/06/2016   Procedure: POLYPECTOMY;  Surgeon: Lucilla Lame, MD;  Location: [place];  Service: Endoscopy;;   Family History  Problem Relation Age of Onset  . Hypertension Mother   . Stroke Mother   . Diabetes Daughter   . Stroke Father   . Healthy Sister   . Hypertension Maternal Grandmother    Social History   Socioeconomic History  . Marital status: Widowed    Spouse name: Shanon Brow  . Number of children: 1  . Years of education: 2 years of college  . Highest education level: 12th grade  Occupational History  .  Occupation: Retired Agricultural engineer at [employer]  . Smoking status: Never Smoker  . Smokeless tobacco: Never Used  . Tobacco comment: smoking cessation materials not required  Vaping Use  . Vaping Use: Never used  Substance and Sexual Activity  . Alcohol use: No    Alcohol/week: 0.0 standard drinks  . Drug use: No  . Sexual activity: Never  Other Topics Concern  . Not on file  Social History Narrative   She still has a house but her daughter and grand-daughter are staying at her house   She moved in with her older sister ( 16 years older), to help her out.    Social Determinants of Health   Financial Resource Strain: Low Risk   . Difficulty of Paying Living Expenses: Not very hard  Food Insecurity: No Food Insecurity  . Worried About Charity fundraiser in the Last Year: Never true  . Ran Out of Food in the Last Year: Never true  Transportation Needs: No Transportation Needs  . Lack of Transportation (Medical): No  .  Lack of Transportation (Non-Medical): No  Physical Activity: Sufficiently Active  . Days of Exercise per Week: 7 days  . Minutes of Exercise per Session: 30 min  Stress: No Stress Concern Present  . Feeling of Stress : Not at all  Social Connections: Moderately Integrated  . Frequency of Communication with Friends and Family: More than three times a week  . Frequency of Social Gatherings with Friends and Family: More than three times a week  . Attends Religious Services: More than 4 times per year  . Active Member of Clubs or Organizations: Yes  . Attends Archivist Meetings: Never  . Marital Status: Widowed    Tobacco Counseling Counseling given: Not Answered Comment: smoking cessation materials not required   Clinical Intake:  Pre-visit preparation completed: Yes  Pain : No/denies pain     Nutritional Risks: None Diabetes: Yes CBG done?: No Did pt. bring in CBG monitor from home?: No  How often do you need to have  someone help you when you read instructions, pamphlets, or other written materials from your doctor or pharmacy?: 1 - Never  Nutrition Risk Assessment:  Has the patient had any N/V/D within the last 2 months?  No  Does the patient have any non-healing wounds?  No  Has the patient had any unintentional weight loss or weight gain?  No   Diabetes:  Is the patient diabetic?  Yes  If diabetic, was a CBG obtained today?  No  Did the patient bring in their glucometer from home?  No  How often do you monitor your CBG's? 1-2 times per week.   Financial Strains and Diabetes Management:  Are you having any financial strains with the device, your supplies or your medication? No .  Does the patient want to be seen by Chronic Care Management for management of their diabetes?  No  Would the patient like to be referred to a Nutritionist or for Diabetic Management?  No   Diabetic Exams:  Diabetic Eye Exam: Completed 12/25/18 negative retinopathy.   Diabetic Foot Exam: Completed 01/08/19.    Interpreter Needed?: No  Information entered by :: Clemetine Marker LPN   Activities of Daily Living In your present state of health, do you have any difficulty performing the following activities: 09/11/2019 08/12/2019  Hearing? N N  Comment declines hearing aids -  Vision? N N  Difficulty concentrating or making decisions? N N  Walking or climbing stairs? N N  Dressing or bathing? N N  Doing errands, shopping? N N  Preparing Food and eating ? N -  Using the Toilet? N -  In the past six months, have you accidently leaked urine? N -  Do you have problems with loss of bowel control? N -  Managing your Medications? N -  Managing your Finances? N -  Housekeeping or managing your Housekeeping? N -  Some recent data might be hidden    Patient Care Team: Steele Sizer, MD as PCP - General (Family Medicine) Yolonda Kida, MD as Consulting Physician (Cardiology) Earlie Server, MD as Consulting Physician  (Oncology) Verdia Kuba, [facility] (Pharmacist)  Indicate any recent Medical Services you may have received from other than [facility] providers in the past year (date may be approximate).     Assessment:   This is a routine wellness examination for [place].  Hearing/Vision screen  Hearing Screening   125Hz  250Hz  500Hz  1000Hz  2000Hz  3000Hz  4000Hz  6000Hz  8000Hz   Right ear:  Left ear:           Comments: Pt denies hearing difficulty  Vision Screening Comments: Annual vision screenings at [facility] issues and exercise activities discussed: Current Exercise Habits: Home exercise routine, Type of exercise: walking, Time (Minutes): 30, Frequency (Times/Week): 7, Weekly Exercise (Minutes/Week): 210, Intensity: Mild, Exercise limited by: None identified  Goals    . Chronic Care Management     CARE PLAN ENTRY  Current Barriers:  . Chronic Disease Management support, education, and care coordination needs related to Hypertension, Hyperlipidemia, Diabetes, and Atrial Fibrillation   Hypertension . Pharmacist Clinical Goal(s): o Over the next 90 days, patient will work with PharmD and providers to maintain BP goal <130/80 . Current regimen:  o Amlodipine-valsartan 10/320mg  daily o Hydrochlorothiazide 12.5mg  daily . Interventions: o Counseled on white coat syndrome . Patient self care activities - Over the next 90 days, patient will: o Check BP daily, document, and provide at future appointments o Ensure daily salt intake < 2300 mg/day  Hyperlipidemia . Pharmacist Clinical Goal(s): o Over the next 90 days, patient will work with PharmD and providers to achieve LDL goal < 70 . Current regimen:  o Atorvastatin 20mg  daily . Interventions: o Increase Lipitor 40mg  daily . Patient self care activities - Over the next 90 days, patient will: o Continue lifestyle modifications o Double atorvastatin until new 40mg  prescription available o Pick up and begin taking  atorvastatin 40mg  daily o Stop atorvastatin 20mg   Diabetes . Pharmacist Clinical Goal(s): o Over the next 90 days, patient will work with PharmD and providers to maintain A1c goal <7% . Current regimen:  o Metformin 750mg  every evening . Interventions: o Counseled that blood sugars may change as taste/appetite returns . Patient self care activities - Over the next 90 days, patient will: o Check blood sugar once daily, document, and provide at future appointments o Contact provider with any episodes of hypoglycemia o Once appetite returns, check blood sugar and call provider or PharmD if higher than normal  Atrial fibrillation . Pharmacist Clinical Goal(s) o Over the next 90 days, patient will work with PharmD and providers to optimize amiodarone dose . Current regimen:  o Amioadarone 200mg  1/2 tab by mouth daily . Interventions: o Change to amiodarone 100mg  1 tab by mouth daily for simplicity . Patient self care activities - Over the next 90 days, patient will: o Report any palpitations or heart rate changes on current, reduced dose amiodarone 100mg  daily  Medication management . Pharmacist Clinical Goal(s): o Over the next 90 days, patient will work with PharmD and providers to maintain optimal medication adherence . Current pharmacy: [facility] . Interventions o Comprehensive medication review performed. o Continue current medication management strategy . Patient self care activities - Over the next 90 days, patient will: o Focus on medication adherence by continuing current practices o Take medications as prescribed o Report any questions or concerns to PharmD and/or provider(s)  Initial goal documentation     . DIET - INCREASE WATER INTAKE     Recommend to drink at least 6-8 8oz glasses of water per day.      Depression Screen PHQ 2/9 Scores 09/11/2019 08/12/2019 05/08/2019 01/08/2019 09/20/2018 09/10/2018 08/26/2018  PHQ - 2 Score 0 0 0 0 0 0 0  PHQ- 9 Score - 0 0 0 0 - 0    Exception Documentation - - - - - - -    Fall Risk Fall Risk  09/11/2019 08/12/2019 05/08/2019 01/08/2019  09/20/2018  Falls in the past year? 0 0 0 0 0  Number falls in past yr: 0 0 0 0 0  Injury with Fall? 0 - 0 0 0  Comment - - - - -  Risk for fall due to : No Fall Risks - - - -  Risk for fall due to: Comment - - - - -  Follow up Falls prevention discussed - - - -    Any stairs in or around the home? Yes  If so, are there any without handrails? Yes  Home free of loose throw rugs in walkways, pet beds, electrical cords, etc? Yes  Adequate lighting in your home to reduce risk of falls? Yes   ASSISTIVE DEVICES UTILIZED TO PREVENT FALLS:  Life alert? No  Use of a cane, walker or w/c? No  Grab bars in the bathroom? No  Shower chair or bench in shower? Yes  Elevated toilet seat or a handicapped toilet? Yes   TIMED UP AND GO:  Was the test performed? No . Telephonic visit.  Cognitive Function:     6CIT Screen 09/11/2019 09/10/2018 05/18/2017  What Year? 0 points 0 points 0 points  What month? 0 points 0 points 0 points  What time? 0 points 0 points 3 points  Count back from 20 0 points 0 points 0 points  Months in reverse 0 points 2 points 0 points  Repeat phrase 2 points 0 points 0 points  Total Score 2 2 3     Immunizations Immunization History  Administered Date(s) Administered  . Fluad Quad(high Dose 65+) 11/19/2018  . Influenza, High Dose Seasonal PF 11/30/2014, 12/09/2015, 12/11/2016, 12/17/2017  . Influenza-Unspecified 10/28/2013  . PFIZER SARS-COV-2 Vaccination 04/03/2019, 07/22/2019  . Pneumococcal Conjugate-13 07/31/2014  . Pneumococcal Polysaccharide-23 08/11/2009  . Tdap 08/11/2009, 10/16/2016  . Zoster 12/19/2011    TDAP status: Up to date   Flu Vaccine status: Up to date   Pneumococcal vaccine status: Up to date   Covid-19 vaccine status: Completed vaccines  Qualifies for Shingles Vaccine? Yes   Zostavax completed Yes   Shingrix Completed?: No.     Education has been provided regarding the importance of this vaccine. Patient has been advised to call insurance company to determine out of pocket expense if they have not yet received this vaccine. Advised may also receive vaccine at local pharmacy or Health Dept. Verbalized acceptance and understanding.  Screening Tests Health Maintenance  Topic Date Due  . INFLUENZA VACCINE  09/28/2019  . OPHTHALMOLOGY EXAM  12/25/2019  . FOOT EXAM  01/08/2020  . HEMOGLOBIN A1C  02/11/2020  . TETANUS/TDAP  10/17/2026  . DEXA SCAN  Completed  . COVID-19 Vaccine  Completed  . PNA vac Low Risk Adult  Completed    Health Maintenance  There are no preventive care reminders to display for this patient.  Colorectal cancer screening: No longer required.    Mammogram status: No longer required.    Bone Density status: Completed 07/19/16. Results reflect: Bone density results: OSTEOPENIA. Repeat every 2 years. Ordered 05/08/19.   Lung Cancer Screening: (Low Dose CT Chest recommended if Age 43-80 years, 30 pack-year currently smoking OR have quit w/in 15years.) does not qualify.   Additional Screening:  Hepatitis C Screening: no longer required  Vision Screening: Recommended annual ophthalmology exams for early detection of glaucoma and other disorders of the eye. Is the patient up to date with their annual eye exam?  Yes  Who is the provider or what is  the name of the office in which the patient attends annual eye exams? [facility] Screening: Recommended annual dental exams for proper oral hygiene  Community Resource Referral / Chronic Care Management: CRR required this visit?  No   CCM required this visit?  No      Plan:     I have personally reviewed and noted the following in the patient's chart:   . Medical and social history . Use of alcohol, tobacco or illicit drugs  . Current medications and supplements . Functional ability and status . Nutritional  status . Physical activity . Advanced directives . List of other physicians . Hospitalizations, surgeries, and ER visits in previous 12 months . Vitals . Screenings to include cognitive, depression, and falls . Referrals and appointments  In addition, I have reviewed and discussed with patient certain preventive protocols, quality metrics, and best practice recommendations. A written personalized care plan for preventive services as well as general preventive health recommendations were provided to patient.     Clemetine Marker, LPN   [id number]   Nurse Notes: pt doing well and appreciative of visit today

## 2019-09-11 NOTE — Patient Instructions (Signed)
Emma Campbell , Thank you for taking time to come for your Medicare Wellness Visit. I appreciate your ongoing commitment to your health goals. Please review the following plan we discussed and let me know if I can assist you in the future.   Screening recommendations/referrals: Colonoscopy: no longer required  Mammogram: no longer required Bone Density: done 07/19/16. Please call [phone] to schedule your bone density screening.  Recommended yearly ophthalmology/optometry visit for glaucoma screening and checkup Recommended yearly dental visit for hygiene and checkup  Vaccinations: Influenza vaccine: done 11/19/18 Pneumococcal vaccine: done 07/31/14 Tdap vaccine: done 10/16/16 Shingles vaccine: Shingrix discussed. Please contact your pharmacy for coverage information.  Covid-19:done 04/03/19 & 07/22/19  Advanced directives: Please bring a copy of your health care power of attorney and living will to the office at your convenience.  Conditions/risks identified: Recommend drinking 6-8 glasses of water per day   Next appointment: Follow up in one year for your annual wellness visit    Preventive Care 65 Years and Older, Female Preventive care refers to lifestyle choices and visits with your health care provider that can promote health and wellness. What does preventive care include?  A yearly physical exam. This is also called an annual well check.  Dental exams once or twice a year.  Routine eye exams. Ask your health care provider how often you should have your eyes checked.  Personal lifestyle choices, including:  Daily care of your teeth and gums.  Regular physical activity.  Eating a healthy diet.  Avoiding tobacco and drug use.  Limiting alcohol use.  Practicing safe sex.  Taking low-dose aspirin every day.  Taking vitamin and mineral supplements as recommended by your health care provider. What happens during an annual well check? The services and screenings done by  your health care provider during your annual well check will depend on your age, overall health, lifestyle risk factors, and family history of disease. Counseling  Your health care provider may ask you questions about your:  Alcohol use.  Tobacco use.  Drug use.  Emotional well-being.  Home and relationship well-being.  Sexual activity.  Eating habits.  History of falls.  Memory and ability to understand (cognition).  Work and work Statistician.  Reproductive health. Screening  You may have the following tests or measurements:  Height, weight, and BMI.  Blood pressure.  Lipid and cholesterol levels. These may be checked every 5 years, or more frequently if you are over 11 years old.  Skin check.  Lung cancer screening. You may have this screening every year starting at age 55 if you have a 30-pack-year history of smoking and currently smoke or have quit within the past 15 years.  Fecal occult blood test (FOBT) of the stool. You may have this test every year starting at age 49.  Flexible sigmoidoscopy or colonoscopy. You may have a sigmoidoscopy every 5 years or a colonoscopy every 10 years starting at age 45.  Hepatitis C blood test.  Hepatitis B blood test.  Sexually transmitted disease (STD) testing.  Diabetes screening. This is done by checking your blood sugar (glucose) after you have not eaten for a while (fasting). You may have this done every 1-3 years.  Bone density scan. This is done to screen for osteoporosis. You may have this done starting at age 55.  Mammogram. This may be done every 1-2 years. Talk to your health care provider about how often you should have regular mammograms. Talk with your health care provider about your  test results, treatment options, and if necessary, the need for more tests. Vaccines  Your health care provider may recommend certain vaccines, such as:  Influenza vaccine. This is recommended every year.  Tetanus,  diphtheria, and acellular pertussis (Tdap, Td) vaccine. You may need a Td booster every 10 years.  Zoster vaccine. You may need this after age 63.  Pneumococcal 13-valent conjugate (PCV13) vaccine. One dose is recommended after age 66.  Pneumococcal polysaccharide (PPSV23) vaccine. One dose is recommended after age 18. Talk to your health care provider about which screenings and vaccines you need and how often you need them. This information is not intended to replace advice given to you by your health care provider. Make sure you discuss any questions you have with your health care provider. Document Released: 03/12/2015 Document Revised: 11/03/2015 Document Reviewed: 12/15/2014 Elsevier Interactive Patient Education  2017 [place] Prevention in the Home Falls can cause injuries. They can happen to people of all ages. There are many things you can do to make your home safe and to help prevent falls. What can I do on the outside of my home?  Regularly fix the edges of walkways and driveways and fix any cracks.  Remove anything that might make you trip as you walk through a door, such as a raised step or threshold.  Trim any bushes or trees on the path to your home.  Use bright outdoor lighting.  Clear any walking paths of anything that might make someone trip, such as rocks or tools.  Regularly check to see if handrails are loose or broken. Make sure that both sides of any steps have handrails.  Any raised decks and porches should have guardrails on the edges.  Have any leaves, snow, or ice cleared regularly.  Use sand or salt on walking paths during winter.  Clean up any spills in your garage right away. This includes oil or grease spills. What can I do in the bathroom?  Use night lights.  Install grab bars by the toilet and in the tub and shower. Do not use towel bars as grab bars.  Use non-skid mats or decals in the tub or shower.  If you need to sit down in  the shower, use a plastic, non-slip stool.  Keep the floor dry. Clean up any water that spills on the floor as soon as it happens.  Remove soap buildup in the tub or shower regularly.  Attach bath mats securely with double-sided non-slip rug tape.  Do not have throw rugs and other things on the floor that can make you trip. What can I do in the bedroom?  Use night lights.  Make sure that you have a light by your bed that is easy to reach.  Do not use any sheets or blankets that are too big for your bed. They should not hang down onto the floor.  Have a firm chair that has side arms. You can use this for support while you get dressed.  Do not have throw rugs and other things on the floor that can make you trip. What can I do in the kitchen?  Clean up any spills right away.  Avoid walking on wet floors.  Keep items that you use a lot in easy-to-reach places.  If you need to reach something above you, use a strong step stool that has a grab bar.  Keep electrical cords out of the way.  Do not use floor polish or wax that  makes floors slippery. If you must use wax, use non-skid floor wax.  Do not have throw rugs and other things on the floor that can make you trip. What can I do with my stairs?  Do not leave any items on the stairs.  Make sure that there are handrails on both sides of the stairs and use them. Fix handrails that are broken or loose. Make sure that handrails are as long as the stairways.  Check any carpeting to make sure that it is firmly attached to the stairs. Fix any carpet that is loose or worn.  Avoid having throw rugs at the top or bottom of the stairs. If you do have throw rugs, attach them to the floor with carpet tape.  Make sure that you have a light switch at the top of the stairs and the bottom of the stairs. If you do not have them, ask someone to add them for you. What else can I do to help prevent falls?  Wear shoes that:  Do not have high  heels.  Have rubber bottoms.  Are comfortable and fit you well.  Are closed at the toe. Do not wear sandals.  If you use a stepladder:  Make sure that it is fully opened. Do not climb a closed stepladder.  Make sure that both sides of the stepladder are locked into place.  Ask someone to hold it for you, if possible.  Clearly mark and make sure that you can see:  Any grab bars or handrails.  First and last steps.  Where the edge of each step is.  Use tools that help you move around (mobility aids) if they are needed. These include:  Canes.  Walkers.  Scooters.  Crutches.  Turn on the lights when you go into a dark area. Replace any light bulbs as soon as they burn out.  Set up your furniture so you have a clear path. Avoid moving your furniture around.  If any of your floors are uneven, fix them.  If there are any pets around you, be aware of where they are.  Review your medicines with your doctor. Some medicines can make you feel dizzy. This can increase your chance of falling. Ask your doctor what other things that you can do to help prevent falls. This information is not intended to replace advice given to you by your health care provider. Make sure you discuss any questions you have with your health care provider. Document Released: 12/10/2008 Document Revised: 07/22/2015 Document Reviewed: 03/20/2014 Elsevier Interactive Patient Education  2017 [employer].

## 2019-09-25 LAB — HM DIABETES EYE EXAM

## 2019-09-30 DIAGNOSIS — I4891 Unspecified atrial fibrillation: Secondary | ICD-10-CM | POA: Diagnosis not present

## 2019-09-30 DIAGNOSIS — E7800 Pure hypercholesterolemia, unspecified: Secondary | ICD-10-CM | POA: Diagnosis not present

## 2019-09-30 DIAGNOSIS — I495 Sick sinus syndrome: Secondary | ICD-10-CM | POA: Diagnosis not present

## 2019-09-30 DIAGNOSIS — E1122 Type 2 diabetes mellitus with diabetic chronic kidney disease: Secondary | ICD-10-CM | POA: Diagnosis not present

## 2019-09-30 DIAGNOSIS — I10 Essential (primary) hypertension: Secondary | ICD-10-CM | POA: Diagnosis not present

## 2019-10-02 ENCOUNTER — Other Ambulatory Visit: Payer: Self-pay

## 2019-10-02 ENCOUNTER — Ambulatory Visit
Admission: RE | Admit: 2019-10-02 | Discharge: 2019-10-02 | Disposition: A | Discharge status: Home / Self Care | Payer: Medicare Other | Source: Ambulatory Visit | Attending: Family Medicine | Admitting: Family Medicine

## 2019-10-02 DIAGNOSIS — Z720 Tobacco use: Secondary | ICD-10-CM | POA: Diagnosis not present

## 2019-10-02 DIAGNOSIS — M85852 Other specified disorders of bone density and structure, left thigh: Secondary | ICD-10-CM | POA: Diagnosis not present

## 2019-10-02 DIAGNOSIS — M8589 Other specified disorders of bone density and structure, multiple sites: Secondary | ICD-10-CM

## 2019-10-02 DIAGNOSIS — Z780 Asymptomatic menopausal state: Secondary | ICD-10-CM | POA: Diagnosis not present

## 2019-10-02 DIAGNOSIS — R29890 Loss of height: Secondary | ICD-10-CM | POA: Diagnosis not present

## 2019-10-24 ENCOUNTER — Telehealth: Payer: Self-pay | Admitting: *Deleted

## 2019-10-24 NOTE — Chronic Care Management (AMB) (Signed)
  Care Management   Note  10/24/2019 Name: Emma Campbell MRN: [id number] DOB: 1932/09/02  Emma Campbell is a 86 y.o. year old female who is a primary care patient of Steele Sizer, MD and is actively engaged with the care management team. I reached out to [name] by phone today to assist with re-scheduling a follow up visit with the Pharmacist.  Follow up plan: Telephone appointment with care management team member scheduled for: 11/04/2019 at 11:00 AM  [facility], [facility]  [place], [place] [address] Direct Dial: [email or web address] Website: [email or web address]

## 2019-11-04 ENCOUNTER — Ambulatory Visit: Payer: Self-pay | Admitting: Pharmacist

## 2019-11-04 ENCOUNTER — Telehealth: Payer: Self-pay

## 2019-11-04 ENCOUNTER — Other Ambulatory Visit: Payer: Self-pay

## 2019-11-04 DIAGNOSIS — E7800 Pure hypercholesterolemia, unspecified: Secondary | ICD-10-CM

## 2019-11-04 DIAGNOSIS — E1122 Type 2 diabetes mellitus with diabetic chronic kidney disease: Secondary | ICD-10-CM

## 2019-11-04 NOTE — Chronic Care Management (AMB) (Signed)
 Chronic Care Management Pharmacy  Name: Emma Campbell  MRN: [id number] DOB: 12/24/1932  Chief Complaint/ HPI  Emma Campbell,  86 y.o. , female presents for their Follow-Up CCM visit with the clinical pharmacist via telephone due to COVID-19 Pandemic.  PCP : Sowles, Krichna, MD  Their chronic conditions include: HTN, HLD, DM  Office Visits: 6/15 DM, Sowles, BP 110/60 P 93 Wt 109 BMI 20.7, A1c 5.9%  Consult Visit: 8/3 [place], BP 116/64 P 89 Wt 111 BMI 21.0  Medications: Outpatient Encounter Medications as of 11/04/2019  Medication Sig Note  . Multiple Vitamins-Minerals (CENTRUM SILVER 50+WOMEN PO) Take 1 tablet by mouth daily.   . amiodarone (PACERONE) 200 MG tablet Take 100 mg by mouth daily. 09/22/2015: Taking half tablet  . amLODipine-valsartan (EXFORGE) 10-320 MG tablet Take 1 tablet by mouth daily.   . aspirin 81 MG tablet Take 81 mg by mouth daily.   . atorvastatin (LIPITOR) 40 MG tablet Take 1 tablet (40 mg total) by mouth daily.   . cholecalciferol (VITAMIN D) 1000 UNITS tablet Take 1,000 Units by mouth daily.    . ferrous sulfate 325 (65 FE) MG EC tablet Take 1 tablet (325 mg total) by mouth 2 (two) times daily with a meal. (Patient taking differently: Take 325 mg by mouth at bedtime. )   . folic acid (FOLVITE) 400 MCG tablet Take 400 mcg by mouth daily.   . hydrochlorothiazide (HYDRODIURIL) 12.5 MG tablet TAKE 1 TABLET EVERY DAY 09/11/2019: Pt taking every other day  . metFORMIN (GLUCOPHAGE-XR) 750 MG 24 hr tablet TAKE 1 TABLET EVERY EVENING   . vitamin B-12 (CYANOCOBALAMIN) 1000 MCG tablet Take 1,000 mcg by mouth 3 (three) times a week.    Facility-Administered Encounter Medications as of 11/04/2019  Medication  . 0.9 %  sodium chloride infusion  . cyanocobalamin ((VITAMIN B-12)) injection 1,000 mcg      Financial Resource Strain: Low Risk   . Difficulty of Paying Living Expenses: Not very hard    Current Diagnosis/Assessment:  Goals Addressed            This  Visit's Progress   . Chronic Care Management       CARE PLAN ENTRY  Current Barriers:  . Chronic Disease Management support, education, and care coordination needs related to Hypertension, Hyperlipidemia, Diabetes, and Atrial Fibrillation   Hypertension . Pharmacist Clinical Goal(s): o Over the next 90 days, patient will work with PharmD and providers to maintain BP goal <130/80 . Current regimen:  o Amlodipine-valsartan 10/320mg daily o Hydrochlorothiazide 12.5mg daily . Interventions: o Counseled on white coat syndrome . Patient self care activities - Over the next 90 days, patient will: o Check BP daily, document, and provide at future appointments o Ensure daily salt intake < 2300 mg/day  Hyperlipidemia . Pharmacist Clinical Goal(s): o Over the next 90 days, patient will work with PharmD and providers to achieve LDL goal < 70 . Current regimen:  o Atorvastatin 40mg daily . Interventions: o None . Patient self care activities - Over the next 90 days, patient will: o Continue lifestyle modifications o Results of atorvastatin not visible in June labwork  Diabetes . Pharmacist Clinical Goal(s): o Over the next 90 days, patient will work with PharmD and providers to maintain A1c goal <7% . Current regimen:  o Metformin 750mg every evening . Interventions: o Counseled that blood sugars may change as taste/appetite returns . Patient self care activities - Over the next 90 days, patient will: o   Check blood sugar once daily, document, and provide at future appointments o Contact provider with any episodes of hypoglycemia o Once appetite returns, check blood sugar and call provider or PharmD if higher than normal  Atrial fibrillation . Pharmacist Clinical Goal(s) o Over the next 90 days, patient will work with PharmD and providers to optimize amiodarone dose . Current regimen:  o Amioadarone 226m 1/2 tab by mouth daily . Interventions: o Change to amiodarone 1072m1 tab by  mouth daily for simplicity . Patient self care activities - Over the next 90 days, patient will: o Report any palpitations or heart rate changes on current, reduced dose amiodarone 10060maily  Medication management . Pharmacist Clinical Goal(s): o Over the next 90 days, patient will work with PharmD and providers to maintain optimal medication adherence . Current pharmacy: [facility] . Interventions o Comprehensive medication review performed. o Continue current medication management strategy . Patient self care activities - Over the next 90 days, patient will: o Focus on medication adherence by continuing current practices o Take medications as prescribed o Report any questions or concerns to PharmD and/or provider(s)  Initial goal documentation       Hypertension   BP goal is:  <130/80  Office blood pressures are  BP Readings from Last 3 Encounters:  08/12/19 110/60  06/13/19 (!) 148/68  05/08/19 121/65   Patient checks BP at home several times per month Patient home BP readings are ranging: 102/56 P 81  Patient has failed these meds in the past: NA Patient is currently controlled on the following medications:  . Exforge 10-320m85mily . HCTZ 12.5mg 57mly  We discussed: At goal. Was upset during April visit Denies hypotension  Plan  Continue current medications       Diabetes   Recent Relevant Labs: Lab Results  Component Value Date/Time   HGBA1C 5.9 (H) 08/12/2019 10:00 AM   HGBA1C 5.9 (A) 01/08/2019 12:05 PM   HGBA1C 6.4 (A) 04/19/2018 10:27 AM   HGBA1C 6.4 04/19/2018 10:27 AM   HGBA1C 6.4 04/19/2018 10:27 AM   HGBA1C 6.4 04/19/2018 10:27 AM   HGBA1C 6.1 12/17/2017 11:11 AM   HGBA1C 6.4 08/15/2017 10:23 AM   MICROALBUR 1.1 08/12/2019 10:00 AM   MICROALBUR 20 01/08/2019 12:05 PM   MICROALBUR 1.0 12/17/2017 11:21 AM   MICROALBUR 20 08/15/2017 10:23 AM     Checking BG: Daily  Patient has failed these meds in past: NA Patient is currently  controlled on the following medications: metformin XR 750mg 68m Last diabetic Foot exam:  Lab Results  Component Value Date/Time   HMDIABEYEEXA No Retinopathy 09/25/2019 12:00 AM    Last diabetic Eye exam: No results found for: HMDIABFOOTEX   We discussed:  Get 2h PP numbers and identify trigger foods  Plan  Continue current medications   Hyperlipidemia   LDL goal < 70  Lipid Panel     Component Value Date/Time   CHOL 183 08/12/2019 1000   CHOL 202 (H) 04/05/2015 1113   TRIG 69 08/12/2019 1000   HDL 76 08/12/2019 1000   HDL 103 04/05/2015 1113   LDLCALC 91 08/12/2019 1000    Hepatic Function Latest Ref Rng & Units 08/12/2019 04/19/2018 12/17/2017  Total Protein 6.1 - 8.1 g/dL 6.8 6.9 7.2  Albumin 3.5 - 5.0 g/dL - - -  AST 10 - 35 U/L _0 ALT 6 - 29 U/L _1 Alk Phosphatase 33 - 130 U/L - - -  Total Bilirubin 0.2 - 1.2 mg/dL 0.6 0.5 0.4     The ASCVD Risk score (Goff DC Jr., et al., 2013) failed to calculate for the following reasons:   The 2013 ASCVD risk score is only valid for ages 40 to 79   Patient has failed these meds in past: NA Patient is currently uncontrolled on the following medications:  . Lipitor 40mg daily  We discussed: Not at goal Adherent to regimen Possible change to Crestor Wants to make further lifestyle improvements instead  Plan  Continue current medications  Medication Management   Pt uses [facility] pharmacy for all medications Uses pill box? Yes Pt endorses 100% compliance  We discussed:  DEXA still shows osteopenia only.  Plan  Continue current medication management strategy   Follow up: 3 month phone visit  Ted Hancock, PharmD, BCGP, CTTS Clinical Pharmacist [facility] [phone]   

## 2019-11-08 NOTE — Patient Instructions (Addendum)
Visit Information  Goals Addressed            This Visit's Progress   . Chronic Care Management       CARE PLAN ENTRY  Current Barriers:  . Chronic Disease Management support, education, and care coordination needs related to Hypertension, Hyperlipidemia, Diabetes, and Atrial Fibrillation   Hypertension . Pharmacist Clinical Goal(s): o Over the next 90 days, patient will work with PharmD and providers to maintain BP goal <130/80 . Current regimen:  o Amlodipine-valsartan 10/320mg  daily o Hydrochlorothiazide 12.5mg  daily . Interventions: o Counseled on white coat syndrome . Patient self care activities - Over the next 90 days, patient will: o Check BP daily, document, and provide at future appointments o Ensure daily salt intake < 2300 mg/day  Hyperlipidemia . Pharmacist Clinical Goal(s): o Over the next 90 days, patient will work with PharmD and providers to achieve LDL goal < 70 . Current regimen:  o Atorvastatin 40mg  daily . Interventions: o None . Patient self care activities - Over the next 90 days, patient will: o Continue lifestyle modifications o Results of atorvastatin not visible in June labwork  Diabetes . Pharmacist Clinical Goal(s): o Over the next 90 days, patient will work with PharmD and providers to maintain A1c goal <7% . Current regimen:  o Metformin 750mg  every evening . Interventions: o Counseled that blood sugars may change as taste/appetite returns . Patient self care activities - Over the next 90 days, patient will: o Check blood sugar once daily, document, and provide at future appointments o Contact provider with any episodes of hypoglycemia o Once appetite returns, check blood sugar and call provider or PharmD if higher than normal  Atrial fibrillation . Pharmacist Clinical Goal(s) o Over the next 90 days, patient will work with PharmD and providers to optimize amiodarone dose . Current regimen:  o Amioadarone 200mg  1/2 tab by mouth  daily . Interventions: o Change to amiodarone 100mg  1 tab by mouth daily for simplicity . Patient self care activities - Over the next 90 days, patient will: o Report any palpitations or heart rate changes on current, reduced dose amiodarone 100mg  daily  Medication management . Pharmacist Clinical Goal(s): o Over the next 90 days, patient will work with PharmD and providers to maintain optimal medication adherence . Current pharmacy: [facility] . Interventions o Comprehensive medication review performed. o Continue current medication management strategy . Patient self care activities - Over the next 90 days, patient will: o Focus on medication adherence by continuing current practices o Take medications as prescribed o Report any questions or concerns to PharmD and/or provider(s)  Initial goal documentation        Print copy of patient instructions provided.   Telephone follow up appointment with pharmacy team member scheduled for: 3 months  Milus Height, PharmD, [place], [facility] [phone]  Food Basics for Chronic Kidney Disease When your kidneys are not working well, they cannot remove waste and excess substances from your blood as effectively as they did before. This can lead to a buildup and imbalance of these substances, which can worsen kidney damage and affect how your body functions. Certain foods lead to a buildup of these substances in the body. By changing your diet as recommended by your diet and nutrition specialist (dietitian) or health care provider, you could help prevent further kidney damage and delay or prevent the need for dialysis. What are tips for following this plan? General instructions   Work with your  health care provider and dietitian to develop a meal plan that is right for you. Foods you can eat, limit, or avoid will be different for each person depending on the stage of kidney disease and any other existing  health conditions.  Talk with your health care provider about whether you should take a vitamin and mineral supplement.  Use standard measuring cups and spoons to measure servings of foods. Use a kitchen scale to measure portions of protein foods.  If directed by your health care provider, avoid drinking too much fluid. Measure and count all liquids, including water, ice, soups, flavored gelatin, and frozen desserts such as popsicles or ice cream. Reading food labels  Check the amount of sodium in foods. Choose foods that have less than 300 milligrams (mg) per serving.  Check the ingredient list for phosphorus or potassium-based additives or preservatives.  Check the amount of saturated and trans fat. Limit or avoid these fats as told by your dietitian. Shopping  Avoid buying foods that are: ? Processed, frozen, or prepackaged. ? Calcium-enriched or fortified.  Do not buy foods that have salt or sodium listed among the first five ingredients.  Do not buy canned vegetables. Cooking  Replace animal proteins, such as meat, fish, eggs, or dairy, with plant proteins from beans, nuts, and soy. ? Use soy milk instead of cow's milk. ? Add beans or tofu to soups, casseroles, or pasta dishes instead of meat.  Soak vegetables, such as potatoes, before cooking to reduce potassium. To do this: ? Peel and cut into small pieces. ? Soak in warm water for at least 2 hours. For every 1 cup of vegetables, use 10 cups of water. ? Drain and rinse with warm water. ? Boil for at least 5 minutes. Meal planning  Limit the amount of protein from plant and animal sources you eat each day.  Do not add salt to food when cooking or before eating.  Eat meals and snacks at around the same time each day. If you have diabetes:  If you have diabetes (diabetes mellitus) and chronic kidney disease, it is important to keep your blood glucose in the target range recommended by your health care provider. Follow  your diabetes management plan. This may include: ? Checking your blood glucose regularly. ? Taking oral medicines, insulin, or both. ? Exercising for at least 30 minutes on 5 or more days each week, or as told by your health care provider. ? Tracking how many servings of carbohydrates you eat at each meal.  You may be given specific guidelines on how much of certain foods and nutrients you may eat, depending on your stage of kidney disease and whether you have high blood pressure (hypertension). Follow your meal plan as told by your dietitian. What nutrients should be limited? The items listed are not a complete list. Talk with your dietitian about what dietary choices are best for you. Potassium Potassium affects how steadily your heart beats. If too much potassium builds up in your blood, it can cause an irregular heartbeat or even a heart attack. You may need to eat less potassium, depending on your blood potassium levels and the stage of kidney disease. Talk to your dietitian about how much potassium you may have each day. You may need to limit or avoid foods that are high in potassium, such as:  Milk and soy milk.  Fruits, such as bananas, papaya, apricots, nectarines, melon, prunes, raisins, kiwi, and oranges.  Vegetables, such as potatoes, sweet  potatoes, yams, tomatoes, leafy greens, beets, okra, avocado, pumpkin, and winter squash.  White and lima beans. Phosphorus Phosphorus is a mineral found in your bones. A balance between calcium and phosphorous is needed to build and maintain healthy bones. Too much phosphorus pulls calcium from your bones. This can make your bones weak and more likely to break. Too much phosphorus can also make your skin itch. You may need to eat less phosphorus depending on your blood phosphorus levels and the stage of kidney disease. Talk to your dietitian about how much potassium you may have each day. You may need to take medicine to lower your blood  phosphorus levels if diet changes do not help. You may need to limit or avoid foods that are high in phosphorus, such as:  Milk and dairy products.  Dried beans and peas.  Tofu, soy milk, and other soy-based meat replacements.  Colas.  Nuts and peanut butter.  Meat, poultry, and fish.  Bran cereals and oatmeals. Protein Protein helps you to make and keep muscle. It also helps in the repair of your body's cells and tissues. One of the natural breakdown products of protein is a waste product called urea. When your kidneys are not working properly, they cannot remove wastes, such as urea, like they did before you developed chronic kidney disease. Reducing how much protein you eat can help prevent a buildup of urea in your blood. Depending on your stage of kidney disease, you may need to limit foods that are high in protein. Sources of animal protein include:  Meat (all types).  Fish and seafood.  Poultry.  Eggs.  Dairy. Other protein foods include:  Beans and legumes.  Nuts and nut butter.  Soy and tofu. Sodium Sodium, which is found in salt, helps maintain a healthy balance of fluids in your body. Too much sodium can increase your blood pressure and have a negative effect on the function of your heart and lungs. Too much sodium can also cause your body to retain too much fluid, making your kidneys work harder. Most people should have less than 2,300 milligrams (mg) of sodium each day. If you have hypertension, you may need to limit your sodium to 1,500 mg each day. Talk to your dietitian about how much sodium you may have each day. You may need to limit or avoid foods that are high in sodium, such as:  Salt seasonings.  Soy sauce.  Cured and processed meats.  Salted crackers and snack foods.  Fast food.  Canned soups and most canned foods.  Pickled foods.  Vegetable juice.  Boxed mixes or ready-to-eat boxed meals and side dishes.  Bottled dressings, sauces,  and marinades. Summary  Chronic kidney disease can lead to a buildup and imbalance of waste and excess substances in the body. Certain foods lead to a buildup of these substances. By adjusting your intake of these foods, you could help prevent more kidney damage and delay or prevent the need for dialysis.  Food adjustments are different for each person with chronic kidney disease. Work with a dietitian to set up nutrient goals and a meal plan that is right for you.  If you have diabetes and chronic kidney disease, it is important to keep your blood glucose in the target range recommended by your health care provider. This information is not intended to replace advice given to you by your health care provider. Make sure you discuss any questions you have with your health care provider. Document Revised:  06/06/2018 Document Reviewed: 02/09/2016 Elsevier Patient Education  [place].

## 2019-12-01 ENCOUNTER — Other Ambulatory Visit: Payer: Self-pay

## 2019-12-01 DIAGNOSIS — I10 Essential (primary) hypertension: Secondary | ICD-10-CM

## 2019-12-01 DIAGNOSIS — E785 Hyperlipidemia, unspecified: Secondary | ICD-10-CM

## 2019-12-01 NOTE — Telephone Encounter (Signed)
Copied from [place] [id number]. Topic: General - Other >> Dec 01, 2019  1:33 PM Hinda Lenis D wrote: PT need to add her new pharmacy [facility] mail order, I was unable to fond it / please call her back

## 2019-12-03 NOTE — Telephone Encounter (Signed)
Pt is aware unable to find [facility] mail order pharm. Pt does not know the address etc. Pt said the phone number is  [phone] and fax [phone]. Pt needs new rx amlodipine-valsartan 10-320 mg, atorvastatin 40 mg 90 day supply with refills

## 2019-12-03 NOTE — Telephone Encounter (Signed)
Refills have been sent to PCP for approval. They will be sent to [facility] pharmacy.

## 2019-12-04 MED ORDER — ATORVASTATIN CALCIUM 40 MG PO TABS: 40.0000 mg | ORAL_TABLET | Freq: Every day | ORAL | 1 refills | Status: DC

## 2019-12-04 MED ORDER — AMLODIPINE BESYLATE-VALSARTAN 10-320 MG PO TABS: 1.0000 | ORAL_TABLET | Freq: Every day | ORAL | 1 refills | Status: DC

## 2019-12-12 ENCOUNTER — Inpatient Hospital Stay: Payer: Medicare Other | Attending: Oncology

## 2019-12-12 ENCOUNTER — Other Ambulatory Visit: Payer: Self-pay

## 2019-12-12 ENCOUNTER — Encounter: Payer: Self-pay | Admitting: Oncology

## 2019-12-12 ENCOUNTER — Inpatient Hospital Stay (HOSPITAL_BASED_OUTPATIENT_CLINIC_OR_DEPARTMENT_OTHER): Payer: Medicare Other | Admitting: Oncology

## 2019-12-12 VITALS — BP 128/73 | HR 75 | Temp 97.7°F | Resp 18 | Ht 61.0 in | Wt 105.3 lb

## 2019-12-12 DIAGNOSIS — Z8639 Personal history of other endocrine, nutritional and metabolic disease: Secondary | ICD-10-CM | POA: Diagnosis not present

## 2019-12-12 DIAGNOSIS — Z7984 Long term (current) use of oral hypoglycemic drugs: Secondary | ICD-10-CM | POA: Diagnosis not present

## 2019-12-12 DIAGNOSIS — Z823 Family history of stroke: Secondary | ICD-10-CM | POA: Insufficient documentation

## 2019-12-12 DIAGNOSIS — Z833 Family history of diabetes mellitus: Secondary | ICD-10-CM | POA: Insufficient documentation

## 2019-12-12 DIAGNOSIS — I129 Hypertensive chronic kidney disease with stage 1 through stage 4 chronic kidney disease, or unspecified chronic kidney disease: Secondary | ICD-10-CM | POA: Insufficient documentation

## 2019-12-12 DIAGNOSIS — D631 Anemia in chronic kidney disease: Secondary | ICD-10-CM | POA: Insufficient documentation

## 2019-12-12 DIAGNOSIS — D500 Iron deficiency anemia secondary to blood loss (chronic): Secondary | ICD-10-CM

## 2019-12-12 DIAGNOSIS — N1831 Chronic kidney disease, stage 3a: Secondary | ICD-10-CM | POA: Insufficient documentation

## 2019-12-12 DIAGNOSIS — Z8249 Family history of ischemic heart disease and other diseases of the circulatory system: Secondary | ICD-10-CM | POA: Insufficient documentation

## 2019-12-12 DIAGNOSIS — E611 Iron deficiency: Secondary | ICD-10-CM | POA: Insufficient documentation

## 2019-12-12 DIAGNOSIS — E1122 Type 2 diabetes mellitus with diabetic chronic kidney disease: Secondary | ICD-10-CM | POA: Insufficient documentation

## 2019-12-12 DIAGNOSIS — Z79899 Other long term (current) drug therapy: Secondary | ICD-10-CM | POA: Diagnosis not present

## 2019-12-12 LAB — COMPREHENSIVE METABOLIC PANEL
ALT: 22 U/L (ref 0–44)
AST: 24 U/L (ref 15–41)
Albumin: 4.2 g/dL (ref 3.5–5.0)
Alkaline Phosphatase: 46 U/L (ref 38–126)
Anion gap: 11 (ref 5–15)
BUN: 15 mg/dL (ref 8–23)
CO2: 26 mmol/L (ref 22–32)
Calcium: 9 mg/dL (ref 8.9–10.3)
Chloride: 100 mmol/L (ref 98–111)
Creatinine, Ser: 0.94 mg/dL (ref 0.44–1.00)
GFR, Estimated: 55 mL/min — ABNORMAL LOW (ref >60)
Glucose, Bld: 126 mg/dL — ABNORMAL HIGH (ref 70–99)
Potassium: 4 mmol/L (ref 3.5–5.1)
Sodium: 137 mmol/L (ref 135–145)
Total Bilirubin: 0.7 mg/dL (ref 0.3–1.2)
Total Protein: 7.3 g/dL (ref 6.5–8.1)

## 2019-12-12 LAB — IRON AND TIBC
Iron: 85 ug/dL (ref 28–170)
Saturation Ratios: 28 % (ref 10.4–31.8)
TIBC: 307 ug/dL (ref 250–450)
UIBC: 222 ug/dL

## 2019-12-12 LAB — CBC WITH DIFFERENTIAL/PLATELET
Abs Immature Granulocytes: 0.01 10*3/uL (ref 0.00–0.07)
Basophils Absolute: 0 10*3/uL (ref 0.0–0.1)
Basophils Relative: 1 %
Eosinophils Absolute: 0.1 10*3/uL (ref 0.0–0.5)
Eosinophils Relative: 4 %
HCT: 35.9 % — ABNORMAL LOW (ref 36.0–46.0)
Hemoglobin: 11.7 g/dL — ABNORMAL LOW (ref 12.0–15.0)
Immature Granulocytes: 0 %
Lymphocytes Relative: 44 %
Lymphs Abs: 1.7 10*3/uL (ref 0.7–4.0)
MCH: 29.8 pg (ref 26.0–34.0)
MCHC: 32.6 g/dL (ref 30.0–36.0)
MCV: 91.3 fL (ref 80.0–100.0)
Monocytes Absolute: 0.2 10*3/uL (ref 0.1–1.0)
Monocytes Relative: 6 %
Neutro Abs: 1.8 10*3/uL (ref 1.7–7.7)
Neutrophils Relative %: 45 %
Platelets: 246 10*3/uL (ref 150–400)
RBC: 3.93 MIL/uL (ref 3.87–5.11)
RDW: 12.7 % (ref 11.5–15.5)
WBC: 3.9 10*3/uL — ABNORMAL LOW (ref 4.0–10.5)
nRBC: 0 % (ref 0.0–0.2)

## 2019-12-12 LAB — FERRITIN: Ferritin: 144 ng/mL (ref 11–307)

## 2019-12-12 LAB — VITAMIN B12: Vitamin B-12: 5291 pg/mL — ABNORMAL HIGH (ref 180–914)

## 2019-12-12 NOTE — Progress Notes (Addendum)
Hematology/Oncology follow up note [facility] Telephone:([phone] Fax:([phone]) [phone]   Patient Care Team: Steele Sizer, MD as PCP - General (Family Medicine) Yolonda Kida, MD as Consulting Physician (Cardiology) Earlie Server, MD as Consulting Physician (Oncology) Verdia Kuba, [facility] (Pharmacist)  REFERRING PROVIDER: Steele Sizer, MD REASON FOR VISIT Follow up for treatment of anemia  HISTORY OF PRESENTING ILLNESS:  Emma Campbell is a  86 y.o.  female with PMH listed below who was referred to me for evaluation of anemia.  Patient follows up with primary care physician and has had labs done recently. 04/17/17 CBC showed hemoglobin 9, MCV 81.9, platelet 245,000, WBC 4.1, normal differential.  Iron panel showed TIBC 442, ferritin 11, saturation 15%. Previous GI workup:  # 01/23/2017 EGD showed non bleeding angiotecsia, treated with APC. 12/22/2016 Capsule study showed duodenum AVM.  11/06/2016 colonoscopy showed non bleeding hemorroids, sigmoid polyp, and diverticulosis.   INTERVAL HISTORY Emma Campbell is a 86 y.o. female who has above history reviewed by me today presents for follow up for management of iron deficiency anemia.  She take oral iron supplementation.  No new complaints.   Review of Systems  Constitutional: Negative for chills, fever, malaise/fatigue and weight loss.  HENT: Negative for sore throat.   Eyes: Negative for redness.  Respiratory: Negative for cough, shortness of breath and wheezing.   Cardiovascular: Negative for chest pain, palpitations and leg swelling.  Gastrointestinal: Negative for abdominal pain, blood in stool, nausea and vomiting.  Genitourinary: Negative for dysuria.  Musculoskeletal: Negative for myalgias.  Skin: Negative for rash.  Neurological: Negative for dizziness, tingling and tremors.  Endo/Heme/Allergies: Does not bruise/bleed easily.  Psychiatric/Behavioral: Negative for hallucinations.    MEDICAL  HISTORY:  Past Medical History:  Diagnosis Date  . Anemia   . Chronic kidney disease, stage III (moderate) (HCC)   . Diverticulosis   . Elevated LFTs   . Essential hypertension, malignant   . Fibrocystic breast disease   . Heart murmur   . Hyperlipidemia   . Menopausal problem   . Osteoarthrosis   . Osteoporosis   . Peripheral autonomic neuropathy   . Presence of permanent cardiac pacemaker   . Sick sinus syndrome (HCC)    Dr. Alfredo Batty  . Type II diabetes mellitus with renal manifestations ([place])   . Vitamin D deficiency     SURGICAL HISTORY: Past Surgical History:  Procedure Laterality Date  . CATARACT EXTRACTION W/PHACO Right 01/07/2019   Procedure: CATARACT EXTRACTION PHACO AND INTRAOCULAR LENS PLACEMENT (IOC) RIGHT DIABETIC 02:15.1          25.7%        34.69;  Surgeon: Birder Robson, MD;  Location: [place];  Service: Ophthalmology;  Laterality: Right;  Diabetic - oral meds  . CATARACT EXTRACTION W/PHACO Left 01/28/2019   Procedure: CATARACT EXTRACTION PHACO AND INTRAOCULAR LENS PLACEMENT (IOC) LEFT DIABETIC 12.96  01:15.5;  Surgeon: Birder Robson, MD;  Location: [place];  Service: Ophthalmology;  Laterality: Left;  Diabetic - oral meds  . COLONOSCOPY WITH PROPOFOL N/A 11/06/2016   Procedure: COLONOSCOPY WITH PROPOFOL;  Surgeon: Lucilla Lame, MD;  Location: [place];  Service: Endoscopy;  Laterality: N/A;  DIABETIC  . ESOPHAGOGASTRODUODENOSCOPY (EGD) WITH PROPOFOL N/A 11/06/2016   Procedure: ESOPHAGOGASTRODUODENOSCOPY (EGD) WITH PROPOFOL;  Surgeon: Lucilla Lame, MD;  Location: [place];  Service: Endoscopy;  Laterality: N/A;  . ESOPHAGOGASTRODUODENOSCOPY (EGD) WITH PROPOFOL N/A 01/23/2017   Procedure: ESOPHAGOGASTRODUODENOSCOPY (EGD) WITH PROPOFOL with PUSH;  Surgeon: Lucilla Lame, MD;  Location: [facility] ENDOSCOPY;  Service: Endoscopy;  Laterality: N/A;  . GIVENS CAPSULE STUDY N/A 12/22/2016   Procedure: GIVENS CAPSULE  STUDY;  Surgeon: Lucilla Lame, MD;  Location: [facility] ENDOSCOPY;  Service: Endoscopy;  Laterality: N/A;  . INSERT / REPLACE / REMOVE PACEMAKER    . PACEMAKER INSERTION  2006  . POLYPECTOMY  11/06/2016   Procedure: POLYPECTOMY;  Surgeon: Lucilla Lame, MD;  Location: [place];  Service: Endoscopy;;    SOCIAL HISTORY: Social History   Socioeconomic History  . Marital status: Widowed    Spouse name: Shanon Brow  . Number of children: 1  . Years of education: 2 years of college  . Highest education level: 12th grade  Occupational History  . Occupation: Retired Agricultural engineer at [employer]  . Smoking status: Never Smoker  . Smokeless tobacco: Never Used  . Tobacco comment: smoking cessation materials not required  Vaping Use  . Vaping Use: Never used  Substance and Sexual Activity  . Alcohol use: No    Alcohol/week: 0.0 standard drinks  . Drug use: No  . Sexual activity: Never  Other Topics Concern  . Not on file  Social History Narrative   She still has a house but her daughter and grand-daughter are staying at her house   She moved in with her older sister ( 44 years older), to help her out.    Social Determinants of Health   Financial Resource Strain: Low Risk   . Difficulty of Paying Living Expenses: Not very hard  Food Insecurity: No Food Insecurity  . Worried About Charity fundraiser in the Last Year: Never true  . Ran Out of Food in the Last Year: Never true  Transportation Needs: No Transportation Needs  . Lack of Transportation (Medical): No  . Lack of Transportation (Non-Medical): No  Physical Activity: Sufficiently Active  . Days of Exercise per Week: 7 days  . Minutes of Exercise per Session: 30 min  Stress: No Stress Concern Present  . Feeling of Stress : Not at all  Social Connections: Moderately Integrated  . Frequency of Communication with Friends and Family: More than three times a week  . Frequency of Social Gatherings with Friends and  Family: More than three times a week  . Attends Religious Services: More than 4 times per year  . Active Member of Clubs or Organizations: Yes  . Attends Archivist Meetings: Never  . Marital Status: Widowed  Intimate Partner Violence: Not At Risk  . Fear of Current or Ex-Partner: No  . Emotionally Abused: No  . Physically Abused: No  . Sexually Abused: No    FAMILY HISTORY: Family History  Problem Relation Age of Onset  . Hypertension Mother   . Stroke Mother   . Diabetes Daughter   . Stroke Father   . Healthy Sister   . Hypertension Maternal Grandmother     ALLERGIES:  is allergic to ace inhibitors.  MEDICATIONS:  Current Outpatient Medications  Medication Sig Dispense Refill  . amiodarone (PACERONE) 200 MG tablet Take 100 mg by mouth daily.    Marland Kitchen amLODipine-valsartan (EXFORGE) 10-320 MG tablet Take 1 tablet by mouth daily. 90 tablet 1  . aspirin 81 MG tablet Take 81 mg by mouth daily.    Marland Kitchen atorvastatin (LIPITOR) 40 MG tablet Take 1 tablet (40 mg total) by mouth daily. 90 tablet 1  . cholecalciferol (VITAMIN D) 1000 UNITS tablet Take 1,000 Units by  mouth daily.     . ferrous sulfate 325 (65 FE) MG EC tablet Take 1 tablet (325 mg total) by mouth 2 (two) times daily with a meal. (Patient taking differently: Take 325 mg by mouth at bedtime. ) 761 tablet 2  . folic acid (FOLVITE) 950 MCG tablet Take 400 mcg by mouth daily.    . hydrochlorothiazide (HYDRODIURIL) 12.5 MG tablet TAKE 1 TABLET EVERY DAY 90 tablet 0  . metFORMIN (GLUCOPHAGE-XR) 750 MG 24 hr tablet TAKE 1 TABLET EVERY EVENING 90 tablet 1  . Multiple Vitamins-Minerals (CENTRUM SILVER 50+WOMEN PO) Take 1 tablet by mouth daily.    . vitamin B-12 (CYANOCOBALAMIN) 1000 MCG tablet Take 1,000 mcg by mouth 3 (three) times a week.     Current Facility-Administered Medications  Medication Dose Route Frequency Provider Last Rate Last Admin  . cyanocobalamin ((VITAMIN B-12)) injection 1,000 mcg  1,000 mcg Intramuscular  Q30 days Steele Sizer, MD   1,000 mcg at 07/08/18 9326   Facility-Administered Medications Ordered in Other Visits  Medication Dose Route Frequency Provider Last Rate Last Admin  . 0.9 %  sodium chloride infusion   Intravenous Continuous Earlie Server, MD 10 mL/hr at 06/19/18 1339 New Bag at 06/19/18 1339     PHYSICAL EXAMINATION: ECOG PERFORMANCE STATUS: 1 - Symptomatic but completely ambulatory Vitals:   12/12/19 1005  BP: 128/73  Pulse: 75  Resp: 18  Temp: 97.7 F (36.5 C)  SpO2: 97%   Filed Weights   12/12/19 1005  Weight: 105 lb 4.8 oz (47.8 kg)    Physical Exam Constitutional:      General: She is not in acute distress.    Appearance: She is not diaphoretic.     Comments: Thin built   HENT:     Head: Normocephalic and atraumatic.     Nose: Nose normal.     Mouth/Throat:     Pharynx: No oropharyngeal exudate.  Eyes:     General: No scleral icterus.       Left eye: No discharge.     Pupils: Pupils are equal, round, and reactive to light.  Neck:     Vascular: No JVD.  Cardiovascular:     Rate and Rhythm: Normal rate and regular rhythm.     Heart sounds: Normal heart sounds. No murmur heard.  No friction rub.  Pulmonary:     Effort: Pulmonary effort is normal. No respiratory distress.     Breath sounds: Normal breath sounds. No wheezing or rales.  Chest:     Chest wall: No tenderness.  Abdominal:     General: Bowel sounds are normal. There is no distension.     Palpations: Abdomen is soft.     Tenderness: There is no abdominal tenderness.  Musculoskeletal:        General: No tenderness. Normal range of motion.     Cervical back: Normal range of motion and neck supple.  Lymphadenopathy:     Cervical: No cervical adenopathy.  Skin:    General: Skin is warm and dry.     Findings: No erythema or rash.  Neurological:     Mental Status: She is alert and oriented to person, place, and time.     Cranial Nerves: No cranial nerve deficit.     Motor: No abnormal  muscle tone.     Coordination: Coordination normal.  Psychiatric:        Mood and Affect: Affect normal.        Judgment: Judgment normal.  LABORATORY DATA:  I have reviewed the data as listed Lab Results  Component Value Date   WBC 3.9 (L) 12/12/2019   HGB 11.7 (L) 12/12/2019   HCT 35.9 (L) 12/12/2019   MCV 91.3 12/12/2019   PLT 246 12/12/2019   Recent Labs    08/12/19 1000 12/12/19 0927  NA 140 137  K 4.2 4.0  CL 105 100  CO2 26 26  GLUCOSE 108* 126*  BUN 9 15  CREATININE 0.94* 0.94  CALCIUM 9.3 9.0  GFRNONAA 55* 55*  GFRAA 64  --   PROT 6.8 7.3  ALBUMIN  --  4.2  AST 17 24  ALT 15 22  ALKPHOS  --  46  BILITOT 0.6 0.7    Iron/TIBC/Ferritin/ %Sat    Component Value Date/Time   IRON 85 12/12/2019 0927   TIBC 307 12/12/2019 0927   FERRITIN 144 12/12/2019 0927   IRONPCTSAT 28 12/12/2019 0927   IRONPCTSAT 15 04/11/2016 1103   SPEP not observed M spike.   ASSESSMENT & PLAN:  1. Iron deficiency anemia due to chronic blood loss   2. Anemia of chronic renal failure, stage 3a ([place])    # Iron deficiency anemia, Labs are reviewed and discussed with patient. Hemoglobin is 11.7, iron panel showed ferritin 144, iron saturation 28.  Continue oral iron supplementation as maintenance.     # History of B12 deficiency, B12 level is high at 5291. Will advise patient to stop b12 supplementation for now.  # Anemia of CKD, hemoglobin is above 10. No intervention needed.   Orders Placed This Encounter  Procedures  . CBC with Differential/Platelet    Standing Status:   Future    Standing Expiration Date:   12/11/2020  . Comprehensive metabolic panel    Standing Status:   Future    Standing Expiration Date:   12/11/2020  . Ferritin    Standing Status:   Future    Standing Expiration Date:   12/11/2020  . Iron and TIBC    Standing Status:   Future    Standing Expiration Date:   12/11/2020  . Vitamin B12    Standing Status:   Future    Standing Expiration  Date:   12/11/2020   All questions were answered. The patient knows to call the clinic with any problems questions or concerns.  Return of visit: 3 months.   Earlie Server, MD, PhD Hematology Oncology [facility] Pager- [phone] 12/12/2019

## 2019-12-12 NOTE — Progress Notes (Signed)
No new changes noted today 

## 2019-12-12 NOTE — Progress Notes (Signed)
Name: Emma Campbell   MRN: [id number]    DOB: 05-22-1932   Date:12/15/2019       Progress Note  Subjective  Chief Complaint  Follow up   HPI   DMII with renal manifestation: CKI and microalbuminuria. HgbA1C was6.4%02/2020last A1C was down to  6 % She denies hypoglycemia episodes.  Glucose at home is getting checked a couple of times weekly , values in the 120-140's She denies polyphagia, polydipsia or polyuria - she has nocturia once per day . Discussed stopping Metformin but she states she is feeling well and wants to continue medication , we will go down to 500 mg  Continue ARB for kidney protection.Eye exam is up to date. She is on statin therapy for dyslipidemia.   HTN: taking medicationdaily and bp is at goal, no dizziness, chest pain or palpitation.BP has been 120-130's/60-70's  She tried to stop HCTZ but caused some swelling and has been taking it four days a week. Today bp is towards low end of normal again, advised to take half pill of Exforge 10/320 mg and take HCTZ 12.5 daily and return in a few days for bp check only. We will change rx to 5/160 if bp between 120-139/80's, or we will adjust dose to 10/160 depending on values  Hyperlipidemia:shehas been taking Rosuvastatin daily and denies side effects at this time, reviewed labs with patient , LDL was 91 and she is now taking 40 mg daily   Sick Sinus Syndrome: she had a pacemaker placed in 2006and has been onAmiodarone since 2006, she is now down to 100 mg of Amiodarone daily, last TSH still normal. No chest pain or palpitation.Sees Dr. Clayborn Bigness, last visit02/2021 , she is [name] score of 2, and is on aspirin onlyper his recommendation. She also has a history of Afib that has been rate controlled. She also has a history of AVM's and not good candidate for blood thinners   Vitamin D deficiency: still taking supplementation, denies fatigue,last Vitamin D was normal    Osteopenia:last FRAX 09/2019 was good risk of  major fracture was 4.6 % and hip was 1.1 %   Anemia: iron deficiency and positive hemoccult, seen by Dr. Durwin Reges andhadEGD and colonoscopy September 2018,diagnosed with AVM small bowel and had it cauterized,hemoglobin dropped and was referred to Dr. Tasia Catchings, labs are being monitored by her, last HCTslightly better, iron storage back to normal   B12 deficiency:  she is seeing Dr. Tasia Catchings and has been off supplementation because last level was too high   Malnourished: explained that she lost 16 lbs from 2019 to 2020  ( she was sick with URI - likely COVID-9 )  She is now taking glucerna shakes and had gained  6 lbs from Nov 2020 to Feb 2021, however had five teeth removed and partials placed and has a hard time eating since May 24 th, 2021 . From October of 2020 until today weight is down only 2 lbs. Advised to continue 3 meals a day, snacks between meals. We will decrease dose of Metformin   Patient Active Problem List   Diagnosis Date Noted   B12 deficiency 05/21/2017   Gastrointestinal tract imaging abnormality    Iron deficiency anemia    Polyp of sigmoid colon    Type 2 diabetes mellitus with stage 2 chronic kidney disease, without long-term current use of insulin ([place]) 08/03/2015   Arthritis, degenerative 05/03/2015   Menopausal and perimenopausal disorder 05/03/2015   Chronic kidney disease (CKD), stage III (moderate) (HCC)  11/30/2014   High risk medication use 11/30/2014   Diverticulosis 11/30/2014   Fibrocystic breast disease 11/30/2014   Elevated LFTs 53/61/4431   Systolic ejection murmur 54/00/8676   Paroxysmal A-fib (HCC) 11/30/2014   Microalbuminuria 07/31/2014   Elevated TSH 07/31/2014   History of cardiac pacemaker 07/31/2014   Sick sinus syndrome ([place]) 08/13/2013   Hypercholesteremia 08/13/2013   Hypertension, benign 08/13/2013   Diabetes mellitus with renal manifestations, controlled ([place]) 08/13/2013   Avitaminosis D 04/12/2009    Past Surgical  History:  Procedure Laterality Date   CATARACT EXTRACTION W/PHACO Right 01/07/2019   Procedure: CATARACT EXTRACTION PHACO AND INTRAOCULAR LENS PLACEMENT (IOC) RIGHT DIABETIC 02:15.1          25.7%        34.69;  Surgeon: Birder Robson, MD;  Location: [place];  Service: Ophthalmology;  Laterality: Right;  Diabetic - oral meds   CATARACT EXTRACTION W/PHACO Left 01/28/2019   Procedure: CATARACT EXTRACTION PHACO AND INTRAOCULAR LENS PLACEMENT (IOC) LEFT DIABETIC 12.96  01:15.5;  Surgeon: Birder Robson, MD;  Location: [place];  Service: Ophthalmology;  Laterality: Left;  Diabetic - oral meds   COLONOSCOPY WITH PROPOFOL N/A 11/06/2016   Procedure: COLONOSCOPY WITH PROPOFOL;  Surgeon: Lucilla Lame, MD;  Location: [place];  Service: Endoscopy;  Laterality: N/A;  DIABETIC   ESOPHAGOGASTRODUODENOSCOPY (EGD) WITH PROPOFOL N/A 11/06/2016   Procedure: ESOPHAGOGASTRODUODENOSCOPY (EGD) WITH PROPOFOL;  Surgeon: Lucilla Lame, MD;  Location: [place];  Service: Endoscopy;  Laterality: N/A;   ESOPHAGOGASTRODUODENOSCOPY (EGD) WITH PROPOFOL N/A 01/23/2017   Procedure: ESOPHAGOGASTRODUODENOSCOPY (EGD) WITH PROPOFOL with PUSH;  Surgeon: Lucilla Lame, MD;  Location: [facility] ENDOSCOPY;  Service: Endoscopy;  Laterality: N/A;   GIVENS CAPSULE STUDY N/A 12/22/2016   Procedure: GIVENS CAPSULE STUDY;  Surgeon: Lucilla Lame, MD;  [facility] ENDOSCOPY;  Service: Endoscopy;  Laterality: N/A;   INSERT / REPLACE / REMOVE PACEMAKER     PACEMAKER INSERTION  2006   POLYPECTOMY  11/06/2016   Procedure: POLYPECTOMY;  Surgeon: Lucilla Lame, MD;  Location: [place];  Service: Endoscopy;;    Family History  Problem Relation Age of Onset   Hypertension Mother    Stroke Mother    Diabetes Daughter    Stroke Father    Healthy Sister    Hypertension Maternal Grandmother     Social History   Tobacco Use   Smoking status: Never Smoker   Smokeless  tobacco: Never Used   Tobacco comment: smoking cessation materials not required  Substance Use Topics   Alcohol use: No    Alcohol/week: 0.0 standard drinks     Current Outpatient Medications:    amiodarone (PACERONE) 200 MG tablet, Take 100 mg by mouth daily., Disp: , Rfl:    amLODipine-valsartan (EXFORGE) 10-320 MG tablet, Take 0.5 tablets by mouth daily., Disp: 10 tablet, Rfl: 0   aspirin 81 MG tablet, Take 81 mg by mouth daily., Disp: , Rfl:    atorvastatin (LIPITOR) 40 MG tablet, Take 1 tablet (40 mg total) by mouth daily., Disp: 90 tablet, Rfl: 1   cholecalciferol (VITAMIN D) 1000 UNITS tablet, Take 1,000 Units by mouth daily. , Disp: , Rfl:    ferrous sulfate 325 (65 FE) MG EC tablet, Take 1 tablet (325 mg total) by mouth 2 (two) times daily with a meal. (Patient taking differently: Take 325 mg by mouth at bedtime. ), Disp: 180 tablet, Rfl: 2   folic acid (FOLVITE) 195 MCG tablet, Take 400 mcg by mouth daily.,  Disp: , Rfl:    hydrochlorothiazide (HYDRODIURIL) 12.5 MG tablet, TAKE 1 TABLET EVERY DAY, Disp: 90 tablet, Rfl: 0   metFORMIN (GLUCOPHAGE-XR) 500 MG 24 hr tablet, Take 1 tablet (500 mg total) by mouth every evening., Disp: 90 tablet, Rfl: 1   Multiple Vitamins-Minerals (CENTRUM SILVER 50+WOMEN PO), Take 1 tablet by mouth daily., Disp: , Rfl:    vitamin B-12 (CYANOCOBALAMIN) 1000 MCG tablet, Take 1,000 mcg by mouth 3 (three) times a week., Disp: , Rfl:   Current Facility-Administered Medications:    cyanocobalamin ((VITAMIN B-12)) injection 1,000 mcg, 1,000 mcg, Intramuscular, Q30 days, Ted Goodner, MD, 1,000 mcg at 07/08/18 5038  Facility-Administered Medications Ordered in Other Visits:    0.9 %  sodium chloride infusion, , Intravenous, Continuous, Earlie Server, MD, Last Rate: 10 mL/hr at 06/19/18 1339, New Bag at 06/19/18 1339  Allergies  Allergen Reactions   Ace Inhibitors Other (See Comments)    Pt unable to recall. Was advised to avoid    I  personally reviewed active problem list, medication list, allergies, family history, social history, health maintenance with the patient/caregiver today.   ROS  Constitutional: Negative for fever or weight change.  Respiratory: Negative for cough and shortness of breath.   Cardiovascular: Negative for chest pain or palpitations.  Gastrointestinal: Negative for abdominal pain, no bowel changes.  Musculoskeletal: Negative for gait problem or joint swelling.  Skin: Negative for rash.  Neurological: Negative for dizziness or headache.  No other specific complaints in a complete review of systems (except as listed in HPI above).  Objective  Vitals:   12/15/19 0921  BP: 112/64  Pulse: 85  Resp: 14  Temp: 98.1 F (36.7 C)  TempSrc: Oral  SpO2: 99%  Weight: 108 lb 3.2 oz (49.1 kg)  Height: 5\' 1"  (1.549 m)    Body mass index is 20.44 kg/m.  Physical Exam  Constitutional: Patient appears well-developed and well-nourished.  No distress.  HEENT: head atraumatic, normocephalic, pupils equal and reactive to light,neck supple Cardiovascular: Normal rate, regular rhythm and normal heart sounds.  3/6 systolic ejection  murmur heard. Trace  BLE edema. Pulmonary/Chest: Effort normal and breath sounds normal. No respiratory distress. Abdominal: Soft.  There is no tenderness. Psychiatric: Patient has a normal mood and affect. behavior is normal. Judgment and thought content normal.   Recent Results (from the past 2160 hour(s))  HM DIABETES EYE EXAM     Status: None   Collection Time: 09/25/19 12:00 AM  Result Value Ref Range   HM Diabetic Eye Exam No Retinopathy No Retinopathy  Vitamin B12     Status: Abnormal   Collection Time: 12/12/19  9:27 AM  Result Value Ref Range   Vitamin B-12 5,291 (H) 180 - 914 pg/mL    Comment: (NOTE) This assay is not validated for testing neonatal or myeloproliferative syndrome specimens for Vitamin B12 levels. Performed at [facility], [place] [address], [place], [place] [address]   Iron and TIBC     Status: None   Collection Time: 12/12/19  9:27 AM  Result Value Ref Range   Iron 85 28 - 170 ug/dL   TIBC 307 250 - 450 ug/dL   Saturation Ratios 28 10.4 - 31.8 %   UIBC 222 ug/dL    Comment: Performed at [facility], [address] [place], [place] [address]  Ferritin     Status: None   Collection Time: 12/12/19  9:27 AM  Result Value Ref Range   Ferritin  144 11 - 307 ng/mL    Comment: Performed at [facility], [address] [place], [place] [address]  Comprehensive metabolic panel     Status: Abnormal   Collection Time: 12/12/19  9:27 AM  Result Value Ref Range   Sodium 137 135 - 145 mmol/L   Potassium 4.0 3.5 - 5.1 mmol/L   Chloride 100 98 - 111 mmol/L   CO2 26 22 - 32 mmol/L   Glucose, Bld 126 (H) 70 - 99 mg/dL    Comment: Glucose reference range applies only to samples taken after fasting for at least 8 hours.   BUN 15 8 - 23 mg/dL   Creatinine, Ser 0.94 0.44 - 1.00 mg/dL   Calcium 9.0 8.9 - 10.3 mg/dL   Total Protein 7.3 6.5 - 8.1 g/dL   Albumin 4.2 3.5 - 5.0 g/dL   AST 24 15 - 41 U/L   ALT 22 0 - 44 U/L   Alkaline Phosphatase 46 38 - 126 U/L   Total Bilirubin 0.7 0.3 - 1.2 mg/dL   GFR, Estimated 55 (L) >60 mL/min   Anion gap 11 5 - 15    Comment: Performed at [facility], [address] [place], [place] [address]  CBC with Differential/Platelet     Status: Abnormal   Collection Time: 12/12/19  9:27 AM  Result Value Ref Range   WBC 3.9 (L) 4.0 - 10.5 K/uL   RBC 3.93 3.87 - 5.11 MIL/uL   Hemoglobin 11.7 (L) 12.0 - 15.0 g/dL   HCT 35.9 (L) 36 - 46 %   MCV 91.3 80.0 - 100.0 fL   MCH 29.8 26.0 - 34.0 pg   MCHC 32.6 30.0 - 36.0 g/dL   RDW 12.7 11.5 - 15.5 %   Platelets 246 150 - 400 K/uL   nRBC 0.0 0.0 - 0.2 %   Neutrophils Relative % 45 %   Neutro Abs 1.8 1.7 - 7.7 K/uL   Lymphocytes Relative 44 %   Lymphs Abs 1.7 0.7 - 4.0 K/uL   Monocytes Relative 6 %   Monocytes Absolute  0.2 0.1 - 1.0 K/uL   Eosinophils Relative 4 %   Eosinophils Absolute 0.1 0.0 - 0.5 K/uL   Basophils Relative 1 %   Basophils Absolute 0.0 0.0 - 0.1 K/uL   Immature Granulocytes 0 %   Abs Immature Granulocytes 0.01 0.00 - 0.07 K/uL    Comment: Performed at [facility], [place], [place] [address]  POCT HgB A1C     Status: Abnormal   Collection Time: 12/15/19  9:24 AM  Result Value Ref Range   Hemoglobin A1C 6.0 (A) 4.0 - 5.6 %   HbA1c POC (<> result, manual entry)     HbA1c, POC (prediabetic range)     HbA1c, POC (controlled diabetic range)      Diabetic Foot Exam: Diabetic Foot Exam - Simple   Simple Foot Form Diabetic Foot exam was performed with the following findings: Yes 12/15/2019 10:10 AM  Visual Inspection See comments: Yes Sensation Testing Intact to touch and monofilament testing bilaterally: Yes Pulse Check Posterior Tibialis and Dorsalis pulse intact bilaterally: Yes Comments Thick toe nails       PHQ2/9: Depression screen [facility] 2/9 12/15/2019 09/11/2019 08/12/2019 05/08/2019 01/08/2019  Decreased Interest 0 0 0 0 0  Down, Depressed, Hopeless 0 0 0 0 0  PHQ - 2 Score 0 0 0 0 0  Altered sleeping - - 0 0 0  Tired,  decreased energy - - 0 0 0  Change in appetite - - 0 0 0  Feeling bad or failure about yourself  - - 0 0 0  Trouble concentrating - - 0 0 0  Moving slowly or fidgety/restless - - 0 0 0  Suicidal thoughts - - 0 0 0  PHQ-9 Score - - 0 0 0  Difficult doing work/chores - - - Not difficult at all Not difficult at all  Some recent data might be hidden    phq 9 is negative   Fall Risk: Fall Risk  12/15/2019 09/11/2019 08/12/2019 05/08/2019 01/08/2019  Falls in the past year? 0 0 0 0 0  Number falls in past yr: 0 0 0 0 0  Injury with Fall? 0 0 - 0 0  Comment - - - - -  Risk for fall due to : - No Fall Risks - - -  Risk for fall due to: Comment - - - - -  Follow up - Falls prevention discussed - - -     Functional Status  Survey: Is the patient deaf or have difficulty hearing?: No Does the patient have difficulty seeing, even when wearing glasses/contacts?: No Does the patient have difficulty concentrating, remembering, or making decisions?: No Does the patient have difficulty walking or climbing stairs?: No Does the patient have difficulty dressing or bathing?: No Does the patient have difficulty doing errands alone such as visiting a doctor's office or shopping?: No    Assessment & Plan  1. Type 2 diabetes mellitus with stage 2 chronic kidney disease, without long-term current use of insulin (HCC)  - POCT HgB A1C - metFORMIN (GLUCOPHAGE-XR) 500 MG 24 hr tablet; Take 1 tablet (500 mg total) by mouth every evening.  Dispense: 90 tablet; Refill: 1  2. Need for immunization against influenza  - Flu Vaccine QUAD High Dose(Fluad)  3. Hypertension, benign  - amLODipine-valsartan (EXFORGE) 10-320 MG tablet; Take 0.5 tablets by mouth daily.  Dispense: 10 tablet; Refill: 0  4. Sick sinus syndrome (HCC)   5. Paroxysmal A-fib (HCC)  Rate controlled   6. Dyslipidemia   7. B12 deficiency    8. Pacemaker   9. Mild protein-calorie malnutrition (HCC)  Discussed Premier protein shakes

## 2019-12-15 ENCOUNTER — Encounter: Payer: Self-pay | Admitting: Family Medicine

## 2019-12-15 ENCOUNTER — Other Ambulatory Visit: Payer: Self-pay

## 2019-12-15 ENCOUNTER — Telehealth: Payer: Self-pay

## 2019-12-15 ENCOUNTER — Ambulatory Visit (INDEPENDENT_AMBULATORY_CARE_PROVIDER_SITE_OTHER): Payer: Medicare Other | Admitting: Family Medicine

## 2019-12-15 VITALS — BP 112/64 | HR 85 | Temp 98.1°F | Resp 14 | Ht 61.0 in | Wt 108.2 lb

## 2019-12-15 DIAGNOSIS — Z23 Encounter for immunization: Secondary | ICD-10-CM | POA: Diagnosis not present

## 2019-12-15 DIAGNOSIS — E538 Deficiency of other specified B group vitamins: Secondary | ICD-10-CM

## 2019-12-15 DIAGNOSIS — E785 Hyperlipidemia, unspecified: Secondary | ICD-10-CM

## 2019-12-15 DIAGNOSIS — I480 Paroxysmal atrial fibrillation: Secondary | ICD-10-CM

## 2019-12-15 DIAGNOSIS — I10 Essential (primary) hypertension: Secondary | ICD-10-CM

## 2019-12-15 DIAGNOSIS — E441 Mild protein-calorie malnutrition: Secondary | ICD-10-CM

## 2019-12-15 DIAGNOSIS — I495 Sick sinus syndrome: Secondary | ICD-10-CM | POA: Diagnosis not present

## 2019-12-15 DIAGNOSIS — N182 Chronic kidney disease, stage 2 (mild): Secondary | ICD-10-CM

## 2019-12-15 DIAGNOSIS — E1122 Type 2 diabetes mellitus with diabetic chronic kidney disease: Secondary | ICD-10-CM

## 2019-12-15 DIAGNOSIS — Z950 Presence of cardiac pacemaker: Secondary | ICD-10-CM

## 2019-12-15 LAB — POCT GLYCOSYLATED HEMOGLOBIN (HGB A1C): Hemoglobin A1C: 6 % — AB (ref 4.0–5.6)

## 2019-12-15 MED ORDER — METFORMIN HCL ER 500 MG PO TB24: 500.0000 mg | ORAL_TABLET | Freq: Every evening | ORAL | 1 refills | Status: DC

## 2019-12-15 MED ORDER — AMLODIPINE BESYLATE-VALSARTAN 10-320 MG PO TABS: 0.5000 | ORAL_TABLET | Freq: Every day | ORAL | 0 refills | Status: DC

## 2019-12-15 NOTE — Telephone Encounter (Signed)
Patient notified

## 2019-12-15 NOTE — Patient Instructions (Addendum)
Try Premier shakes   We will cut down your Amlodipine / valsartan dose to 10/320 tab - to take half a pill Return in a few days to see CMA for bp only check

## 2019-12-15 NOTE — Telephone Encounter (Signed)
-----   Message from Earlie Server, MD sent at 12/12/2019  7:22 PM EDT ----- Recommend pt to continue iron supplementation. B12 level is very high, she can stop b12 supplementation.

## 2019-12-15 NOTE — Addendum Note (Signed)
Addended by: Steele Sizer F on: 12/15/2019 10:19 AM   Modules accepted: Orders

## 2019-12-18 ENCOUNTER — Other Ambulatory Visit: Payer: Self-pay

## 2019-12-18 ENCOUNTER — Ambulatory Visit (INDEPENDENT_AMBULATORY_CARE_PROVIDER_SITE_OTHER): Payer: Medicare Other

## 2019-12-18 VITALS — BP 120/64 | HR 84

## 2019-12-18 DIAGNOSIS — Z0130 Encounter for examination of blood pressure without abnormal findings: Secondary | ICD-10-CM

## 2019-12-18 NOTE — Progress Notes (Signed)
Patient is here for a blood pressure check. Patient denies chest pain, palpitations, shortness of breath or visual disturbances. At previous visit blood pressure was 112/64 with a heart rate of 85. Today during nurse visit first check blood pressure was 120/64 with a heart rate of 84. She does take blood pressure medications as prescribed with no missed doses.

## 2019-12-24 ENCOUNTER — Telehealth: Payer: Self-pay

## 2019-12-24 NOTE — Telephone Encounter (Signed)
Copied from [place] [id number]. Topic: General - Other >> Dec 24, 2019 12:09 PM Leward Quan A wrote: Reason for CRM: Patient called in to leave this number from [facility] for Dr [name] so she can get her medication mailed to her Ph# [phone]

## 2020-01-13 ENCOUNTER — Other Ambulatory Visit: Payer: Self-pay | Admitting: Family Medicine

## 2020-01-13 DIAGNOSIS — E785 Hyperlipidemia, unspecified: Secondary | ICD-10-CM

## 2020-01-13 DIAGNOSIS — N182 Chronic kidney disease, stage 2 (mild): Secondary | ICD-10-CM

## 2020-01-13 MED ORDER — METFORMIN HCL ER 500 MG PO TB24: 500.0000 mg | ORAL_TABLET | Freq: Every evening | ORAL | 1 refills | Status: DC

## 2020-01-13 MED ORDER — ATORVASTATIN CALCIUM 40 MG PO TABS: 40.0000 mg | ORAL_TABLET | Freq: Every day | ORAL | 1 refills | Status: DC

## 2020-01-13 NOTE — Telephone Encounter (Signed)
Change of pharmacy Requested Prescriptions  Pending Prescriptions Disp Refills  . metFORMIN (GLUCOPHAGE-XR) 500 MG 24 hr tablet 90 tablet 1    Sig: Take 1 tablet (500 mg total) by mouth every evening.     Endocrinology:  Diabetes - Biguanides Passed - 01/13/2020 12:25 PM      Passed - Cr in normal range and within 360 days    Creat  Date Value Ref Range Status  08/12/2019 0.94 (H) 0.60 - 0.88 mg/dL Final    Comment:    For patients >84 years of age, the reference limit for Creatinine is approximately 13% higher for people identified as African-American. .    Creatinine, Ser  Date Value Ref Range Status  12/12/2019 0.94 0.44 - 1.00 mg/dL Final   Creatinine, Urine  Date Value Ref Range Status  08/12/2019 109 20 - 275 mg/dL Final         Passed - HBA1C is between 0 and 7.9 and within 180 days    Hemoglobin A1C  Date Value Ref Range Status  12/15/2019 6.0 (A) 4.0 - 5.6 % Final   HbA1c, POC (prediabetic range)  Date Value Ref Range Status  04/19/2018 6.4 5.7 - 6.4 % Final   HbA1c, POC (controlled diabetic range)  Date Value Ref Range Status  04/19/2018 6.4 0.0 - 7.0 % Final   HbA1c POC (<> result, manual entry)  Date Value Ref Range Status  04/19/2018 6.4 4.0 - 5.6 % Final   Hgb A1c MFr Bld  Date Value Ref Range Status  08/12/2019 5.9 (H) <5.7 % of total Hgb Final    Comment:    For someone without known diabetes, a hemoglobin  A1c value between 5.7% and 6.4% is consistent with prediabetes and should be confirmed with a  follow-up test. . For someone with known diabetes, a value <7% indicates that their diabetes is well controlled. A1c targets should be individualized based on duration of diabetes, age, comorbid conditions, and other considerations. . This assay result is consistent with an increased risk of diabetes. . Currently, no consensus exists regarding use of hemoglobin A1c for diagnosis of diabetes for children. .          Passed - AA eGFR in  normal range and within 360 days    GFR, Est African American  Date Value Ref Range Status  08/12/2019 64 > OR = 60 mL/min/1.15m Final   GFR, Est Non African American  Date Value Ref Range Status  08/12/2019 55 (L) > OR = 60 mL/min/1.760mFinal   GFR, Estimated  Date Value Ref Range Status  12/12/2019 55 (L) >60 mL/min Final         Passed - Valid encounter within last 6 months    Recent Outpatient Visits          4 weeks ago Type 2 diabetes mellitus with stage 2 chronic kidney disease, without long-term current use of insulin ([facility]   5 months ago Type 2 diabetes mellitus with stage 2 chronic kidney disease, without long-term current use of insulin ([facility]   8 months ago Paroxysmal A-fib ([facility]  [facility]   1 year ago Controlled type 2 diabetes mellitus with stage 3 chronic kidney disease, without long-term current use of insulin ([facility]   1 year ago Hypertension, benign  [facility] Steele Sizer, MD      Future Appointments            In 3 months [name], Drue Stager, MD [facility], [place]   In 8 months  [facility] [place]           . atorvastatin (LIPITOR) 40 MG tablet 90 tablet 1    Sig: Take 1 tablet (40 mg total) by mouth daily.     Cardiovascular:  Antilipid - Statins Passed - 01/13/2020 12:25 PM      Passed - Total Cholesterol in normal range and within 360 days    Cholesterol, Total  Date Value Ref Range Status  04/05/2015 202 (H) 100 - 199 mg/dL Final   Cholesterol  Date Value Ref Range Status  08/12/2019 183 <200 mg/dL Final         Passed - LDL in normal range and within 360 days    LDL Cholesterol (Calc)  Date Value Ref Range Status  08/12/2019 91 mg/dL (calc) Final    Comment:    Reference  range: <100 . Desirable range <100 mg/dL for primary prevention;   <70 mg/dL for patients with CHD or diabetic patients  with > or = 2 CHD risk factors. Marland Kitchen LDL-C is now calculated using the Martin-Hopkins  calculation, which is a validated novel method providing  better accuracy than the Friedewald equation in the  estimation of LDL-C.  Cresenciano Genre et al. Annamaria Helling. 6837;290(21): 2061-2068  (http://education.[employer].com/faq/FAQ164)          Passed - HDL in normal range and within 360 days    HDL  Date Value Ref Range Status  08/12/2019 76 > OR = 50 mg/dL Final  04/05/2015 103 >39 mg/dL Final         Passed - Triglycerides in normal range and within 360 days    Triglycerides  Date Value Ref Range Status  08/12/2019 69 <150 mg/dL Final         Passed - Patient is not pregnant      Passed - Valid encounter within last 12 months    Recent Outpatient Visits          4 weeks ago Type 2 diabetes mellitus with stage 2 chronic kidney disease, without long-term current use of insulin ([place])   [facility] [place], Drue Stager, MD   5 months ago Type 2 diabetes mellitus with stage 2 chronic kidney disease, without long-term current use of insulin [facility])   [facility] [place], Drue Stager, MD   8 months ago Paroxysmal A-fib [facility])   [facility] Steele Sizer, MD   1 year ago Controlled type 2 diabetes mellitus with stage 3 chronic kidney disease, without long-term current use of insulin [facility])   [facility] Steele Sizer, MD   1 year ago Hypertension, benign   [facility] Steele Sizer, MD      Future Appointments            In 3 months [name], Drue Stager, MD [facility], [place]   In 8 months  [place]

## 2020-01-13 NOTE — Telephone Encounter (Signed)
PT need a refill  metFORMIN (GLUCOPHAGE-XR) 500 MG 24 hr tablet [[id number]]  atorvastatin (LIPITOR) 40 MG tablet [place] [address] ([address]), [place] [address]  [place]) [place] [address]  Phone: [phone] Fax: [phone]

## 2020-01-13 NOTE — Addendum Note (Signed)
Addended by: Jefferson Fuel on: 01/13/2020 12:25 PM   Modules accepted: Orders

## 2020-02-10 ENCOUNTER — Ambulatory Visit (INDEPENDENT_AMBULATORY_CARE_PROVIDER_SITE_OTHER): Payer: Medicare Other | Admitting: Family Medicine

## 2020-02-10 ENCOUNTER — Encounter: Payer: Self-pay | Admitting: Family Medicine

## 2020-02-10 VITALS — Ht 61.0 in | Wt 108.0 lb

## 2020-02-10 DIAGNOSIS — R058 Other specified cough: Secondary | ICD-10-CM

## 2020-02-10 MED ORDER — BENZONATATE 100 MG PO CAPS: 100.0000 mg | ORAL_CAPSULE | Freq: Three times a day (TID) | ORAL | 0 refills | Status: DC | PRN

## 2020-02-10 MED ORDER — BREO ELLIPTA 100-25 MCG/INH IN AEPB: 1.0000 | INHALATION_SPRAY | Freq: Every day | RESPIRATORY_TRACT | 0 refills | Status: DC

## 2020-02-10 NOTE — Progress Notes (Signed)
Name: Emma Campbell   MRN: [id number]    DOB: Feb 25, 1933   Date:02/10/2020       Progress Note  Subjective  Chief Complaint  Chief Complaint  Patient presents with  . Cough    X2 Weeks  . chest congestion    I connected with  LASHAI GROSCH on 02/10/20 at 11:20 AM EST by telephone and verified that I am speaking with the correct person using two identifiers.  I discussed the limitations, risks, security and privacy concerns of performing an evaluation and management service by telephone and the availability of in person appointments. Staff also discussed with the patient that there may be a patient responsible charge related to this service. Patient agreed on having a virtual visit  Patient Location: at home  Provider Location: [facility] Additional Individuals present: sister Onalee Hua is with her   HPI  Cough: she states started as a dry cough about 2 weeks ago that was occasional, but over the past week cough has been getting more frequent, it is dry, not associated with wheezing, SOB, fever, chills. Nausea or vomiting. She states her sister also has a dry cough . She is up to date with flu and COVID-19 vaccine. Her appetite varies but she has been eating daily. She denies fatigue. She just annoyed with the cough    Patient Active Problem List   Diagnosis Date Noted  . B12 deficiency 05/21/2017  . Gastrointestinal tract imaging abnormality   . Iron deficiency anemia   . Polyp of sigmoid colon   . Type 2 diabetes mellitus with stage 2 chronic kidney disease, without long-term current use of insulin ([place]) 08/03/2015  . Arthritis, degenerative 05/03/2015  . Menopausal and perimenopausal disorder 05/03/2015  . Chronic kidney disease (CKD), stage III (moderate) ([place]) 11/30/2014  . High risk medication use 11/30/2014  . Diverticulosis 11/30/2014  . Fibrocystic breast disease 11/30/2014  . Elevated LFTs 11/30/2014  . Systolic ejection murmur 50/10/3816  . Paroxysmal A-fib ([place]) 11/30/2014   . Microalbuminuria 07/31/2014  . Elevated TSH 07/31/2014  . History of cardiac pacemaker 07/31/2014  . Hypercholesteremia 08/13/2013  . Hypertension, benign 08/13/2013  . Diabetes mellitus with renal manifestations, controlled ([place]) 08/13/2013  . Avitaminosis D 04/12/2009    Past Surgical History:  Procedure Laterality Date  . CATARACT EXTRACTION W/PHACO Right 01/07/2019   Procedure: CATARACT EXTRACTION PHACO AND INTRAOCULAR LENS PLACEMENT (IOC) RIGHT DIABETIC 02:15.1          25.7%        34.69;  Surgeon: Birder Robson, MD;  Location: [place];  Service: Ophthalmology;  Laterality: Right;  Diabetic - oral meds  . CATARACT EXTRACTION W/PHACO Left 01/28/2019   Procedure: CATARACT EXTRACTION PHACO AND INTRAOCULAR LENS PLACEMENT (IOC) LEFT DIABETIC 12.96  01:15.5;  Surgeon: Birder Robson, MD;  Location: [place];  Service: Ophthalmology;  Laterality: Left;  Diabetic - oral meds  . COLONOSCOPY WITH PROPOFOL N/A 11/06/2016   Procedure: COLONOSCOPY WITH PROPOFOL;  Surgeon: Lucilla Lame, MD;  Location: [place];  Service: Endoscopy;  Laterality: N/A;  DIABETIC  . ESOPHAGOGASTRODUODENOSCOPY (EGD) WITH PROPOFOL N/A 11/06/2016   Procedure: ESOPHAGOGASTRODUODENOSCOPY (EGD) WITH PROPOFOL;  Surgeon: Lucilla Lame, MD;  Location: [place];  Service: Endoscopy;  Laterality: N/A;  . ESOPHAGOGASTRODUODENOSCOPY (EGD) WITH PROPOFOL N/A 01/23/2017   Procedure: ESOPHAGOGASTRODUODENOSCOPY (EGD) WITH PROPOFOL with PUSH;  Surgeon: Lucilla Lame, MD;  Location: [facility] ENDOSCOPY;  Service: Endoscopy;  Laterality: N/A;  . GIVENS CAPSULE STUDY N/A 12/22/2016  Procedure: GIVENS CAPSULE STUDY;  Surgeon: Lucilla Lame, MD;  Location: [facility] ENDOSCOPY;  Service: Endoscopy;  Laterality: N/A;  . INSERT / REPLACE / REMOVE PACEMAKER    . PACEMAKER INSERTION  2006  . POLYPECTOMY  11/06/2016   Procedure: POLYPECTOMY;  Surgeon: Lucilla Lame, MD;  Location: [place];   Service: Endoscopy;;    Family History  Problem Relation Age of Onset  . Hypertension Mother   . Stroke Mother   . Diabetes Daughter   . Stroke Father   . Healthy Sister   . Hypertension Maternal Grandmother     Social History   Socioeconomic History  . Marital status: Widowed    Spouse name: Shanon Brow  . Number of children: 1  . Years of education: 2 years of college  . Highest education level: 12th grade  Occupational History  . Occupation: Retired Agricultural engineer at [employer]  . Smoking status: Never Smoker  . Smokeless tobacco: Never Used  . Tobacco comment: smoking cessation materials not required  Vaping Use  . Vaping Use: Never used  Substance and Sexual Activity  . Alcohol use: No    Alcohol/week: 0.0 standard drinks  . Drug use: No  . Sexual activity: Never  Other Topics Concern  . Not on file  Social History Narrative   She still has a house but her daughter and grand-daughter are staying at her house   She moved in with her older sister ( 75 years older), to help her out.    Social Determinants of Health   Financial Resource Strain: Low Risk   . Difficulty of Paying Living Expenses: Not very hard  Food Insecurity: No Food Insecurity  . Worried About Charity fundraiser in the Last Year: Never true  . Ran Out of Food in the Last Year: Never true  Transportation Needs: No Transportation Needs  . Lack of Transportation (Medical): No  . Lack of Transportation (Non-Medical): No  Physical Activity: Sufficiently Active  . Days of Exercise per Week: 7 days  . Minutes of Exercise per Session: 30 min  Stress: No Stress Concern Present  . Feeling of Stress : Not at all  Social Connections: Moderately Integrated  . Frequency of Communication with Friends and Family: More than three times a week  . Frequency of Social Gatherings with Friends and Family: More than three times a week  . Attends Religious Services: More than 4 times per year  . Active  Member of Clubs or Organizations: Yes  . Attends Archivist Meetings: Never  . Marital Status: Widowed  Intimate Partner Violence: Not At Risk  . Fear of Current or Ex-Partner: No  . Emotionally Abused: No  . Physically Abused: No  . Sexually Abused: No     Current Outpatient Medications:  .  amiodarone (PACERONE) 200 MG tablet, Take 100 mg by mouth daily., Disp: , Rfl:  .  amLODipine-valsartan (EXFORGE) 10-320 MG tablet, Take 0.5 tablets by mouth daily., Disp: 10 tablet, Rfl: 0 .  aspirin 81 MG tablet, Take 81 mg by mouth daily., Disp: , Rfl:  .  atorvastatin (LIPITOR) 40 MG tablet, Take 1 tablet (40 mg total) by mouth daily., Disp: 90 tablet, Rfl: 1 .  cholecalciferol (VITAMIN D) 1000 UNITS tablet, Take 1,000 Units by mouth daily. , Disp: , Rfl:  .  ferrous sulfate 325 (65 FE) MG EC tablet, Take 1 tablet (325 mg total) by mouth 2 (two) times daily with a meal. (  Patient taking differently: Take 325 mg by mouth at bedtime.), Disp: 180 tablet, Rfl: 2 .  folic acid (FOLVITE) 096 MCG tablet, Take 400 mcg by mouth daily., Disp: , Rfl:  .  hydrochlorothiazide (HYDRODIURIL) 12.5 MG tablet, TAKE 1 TABLET EVERY DAY, Disp: 90 tablet, Rfl: 0 .  metFORMIN (GLUCOPHAGE-XR) 500 MG 24 hr tablet, Take 1 tablet (500 mg total) by mouth every evening., Disp: 90 tablet, Rfl: 1 .  Multiple Vitamins-Minerals (CENTRUM SILVER 50+WOMEN PO), Take 1 tablet by mouth daily., Disp: , Rfl:  .  benzonatate (TESSALON) 100 MG capsule, Take 1-2 capsules (100-200 mg total) by mouth 3 (three) times daily as needed., Disp: 90 capsule, Rfl: 0 .  fluticasone furoate-vilanterol (BREO ELLIPTA) 100-25 MCG/INH AEPB, Inhale 1 puff into the lungs daily., Disp: 60 each, Rfl: 0 .  vitamin B-12 (CYANOCOBALAMIN) 1000 MCG tablet, Take 1,000 mcg by mouth 3 (three) times a week. (Patient not taking: Reported on 02/10/2020), Disp: , Rfl:   Current Facility-Administered Medications:  .  cyanocobalamin ((VITAMIN B-12)) injection 1,000  mcg, 1,000 mcg, Intramuscular, Q30 days, Steele Sizer, MD, 1,000 mcg at 07/08/18 2836  Facility-Administered Medications Ordered in Other Visits:  .  0.9 %  sodium chloride infusion, , Intravenous, Continuous, Earlie Server, MD, Last Rate: 10 mL/hr at 06/19/18 1339, New Bag at 06/19/18 1339  Allergies  Allergen Reactions  . Ace Inhibitors Other (See Comments)    Pt unable to recall. Was advised to avoid    I personally reviewed active problem list, medication list, allergies, family history, social history, health maintenance with the patient/caregiver today.   ROS  Ten systems reviewed and is negative except as mentioned in HPI   Objective  Virtual encounter, vitals not obtained.  Body mass index is 20.41 kg/m.  Physical Exam  Awake, alert and oriented   PHQ2/9: Depression screen [facility] 2/9 02/10/2020 12/15/2019 09/11/2019 08/12/2019 05/08/2019  Decreased Interest 0 0 0 0 0  Down, Depressed, Hopeless 0 0 0 0 0  PHQ - 2 Score 0 0 0 0 0  Altered sleeping - - - 0 0  Tired, decreased energy - - - 0 0  Change in appetite - - - 0 0  Feeling bad or failure about yourself  - - - 0 0  Trouble concentrating - - - 0 0  Moving slowly or fidgety/restless - - - 0 0  Suicidal thoughts - - - 0 0  PHQ-9 Score - - - 0 0  Difficult doing work/chores - - - - Not difficult at all  Some recent data might be hidden   PHQ-2/9 Result is negative.    Fall Risk: Fall Risk  02/10/2020 12/15/2019 09/11/2019 08/12/2019 05/08/2019  Falls in the past year? 0 0 0 0 0  Number falls in past yr: 0 0 0 0 0  Injury with Fall? 0 0 0 - 0  Comment - - - - -  Risk for fall due to : - - No Fall Risks - -  Risk for fall due to: Comment - - - - -  Follow up - - Falls prevention discussed - -     Assessment & Plan  1. Dry cough  Explained : no red flags, monitor for change in appetite, fatigue, progression of cough or if it gets productive, at this time we will not send antibiotics to pharmacy  - benzonatate  (TESSALON) 100 MG capsule; Take 1-2 capsules (100-200 mg total) by mouth 3 (three) times daily as needed.  Dispense: 90 capsule; Refill: 0 - fluticasone furoate-vilanterol (BREO ELLIPTA) 100-25 MCG/INH AEPB; Inhale 1 puff into the lungs daily.  Dispense: 60 each; Refill: 0 I discussed the assessment and treatment plan with the patient. The patient was provided an opportunity to ask questions and all were answered. The patient agreed with the plan and demonstrated an understanding of the instructions.   The patient was advised to call back or seek an in-person evaluation if the symptoms worsen or if the condition fails to improve as anticipated.  I provided 15  minutes of non-face-to-face time during this encounter.  Loistine Chance, MD

## 2020-02-11 ENCOUNTER — Telehealth: Payer: Self-pay

## 2020-02-11 ENCOUNTER — Ambulatory Visit: Payer: Self-pay

## 2020-02-11 DIAGNOSIS — N182 Chronic kidney disease, stage 2 (mild): Secondary | ICD-10-CM

## 2020-02-11 DIAGNOSIS — I10 Essential (primary) hypertension: Secondary | ICD-10-CM

## 2020-02-11 NOTE — Chronic Care Management (AMB) (Signed)
Chronic Care Management Pharmacy  Name: Emma Campbell  MRN: [id number] DOB: 02-Jan-1933  Chief Complaint/ HPI  Emma Campbell,  86 y.o. , female presents for their Follow-Up CCM visit with the clinical pharmacist via telephone.  PCP : Steele Sizer, MD  Their chronic conditions include: Hypertension, Hyperlipidemia, Diabetes, Atrial Fibrillation, and Chronic Kidney Disease  Office Visits: 02/10/20: Patient presented to Dr. [name] for dry cough x2 weeks. Patient started on bezonatate and [place].  12/15/19: Patient presented to Dr. [name] for follow-up. A1c stable. BP 112/64, amlodipine-valsartan decreased to 1/2 tablet daily   Consult Visit: 8/3 [place], BP 116/64 P 89 Wt 111 BMI 21.0  Medications: Outpatient Encounter Medications as of 02/11/2020  Medication Sig Note  . amiodarone (PACERONE) 200 MG tablet Take 100 mg by mouth daily. 09/22/2015: Taking half tablet  . amLODipine-valsartan (EXFORGE) 10-320 MG tablet Take 0.5 tablets by mouth daily.   Marland Kitchen aspirin 81 MG tablet Take 81 mg by mouth daily.   Marland Kitchen atorvastatin (LIPITOR) 40 MG tablet Take 1 tablet (40 mg total) by mouth daily.   . benzonatate (TESSALON) 100 MG capsule Take 1-2 capsules (100-200 mg total) by mouth 3 (three) times daily as needed.   . cholecalciferol (VITAMIN D) 1000 UNITS tablet Take 1,000 Units by mouth daily.    . ferrous sulfate 325 (65 FE) MG EC tablet Take 1 tablet (325 mg total) by mouth 2 (two) times daily with a meal. (Patient taking differently: Take 325 mg by mouth at bedtime.)   . fluticasone furoate-vilanterol (BREO ELLIPTA) 100-25 MCG/INH AEPB Inhale 1 puff into the lungs daily.   . folic acid (FOLVITE) 355 MCG tablet Take 400 mcg by mouth daily.   . hydrochlorothiazide (HYDRODIURIL) 12.5 MG tablet TAKE 1 TABLET EVERY DAY 09/11/2019: Pt taking every other day  . metFORMIN (GLUCOPHAGE-XR) 500 MG 24 hr tablet Take 1 tablet (500 mg total) by mouth every evening.   . Multiple Vitamins-Minerals  (CENTRUM SILVER 50+WOMEN PO) Take 1 tablet by mouth daily.   . vitamin B-12 (CYANOCOBALAMIN) 1000 MCG tablet Take 1,000 mcg by mouth 3 (three) times a week. (Patient not taking: Reported on 02/10/2020)    Facility-Administered Encounter Medications as of 02/11/2020  Medication  . 0.9 %  sodium chloride infusion  . cyanocobalamin ((VITAMIN B-12)) injection 1,000 mcg    Current Diagnosis/Assessment:  SDOH Interventions   Flowsheet Row Most Recent Value  SDOH Interventions   Financial Strain Interventions Intervention Not Indicated  Transportation Interventions Intervention Not Indicated     Goals Addressed            This Visit's Progress   . Chronic Care Management       CARE PLAN ENTRY  Current Barriers:  . Chronic Disease Management support, education, and care coordination needs related to Hypertension, Hyperlipidemia, Diabetes, Atrial Fibrillation, and Chronic Kidney Disease   Hypertension . Pharmacist Clinical Goal(s): o Over the next 90 days, patient will work with PharmD and providers to maintain BP goal <130/80 . Current regimen:  o Amlodipine-valsartan 10/364m  1/2 tablet daily o Hydrochlorothiazide 12.550mdaily . Patient self care activities - Over the next 90 days, patient will: o Check BP daily, document, and provide at future appointments o Ensure daily salt intake < 2300 mg/day  Hyperlipidemia . Pharmacist Clinical Goal(s): o Over the next 90 days, patient will work with PharmD and providers to achieve LDL goal < 70 . Current regimen:  o Atorvastatin 4071maily . Interventions: o None . Patient  self care activities - Over the next 90 days, patient will: o Continue lifestyle modifications  Diabetes . Pharmacist Clinical Goal(s): o Over the next 90 days, patient will work with PharmD and providers to maintain A1c goal <7% . Current regimen:  o Metformin 536m every evening . Patient self care activities - Over the next 90 days, patient will: o Check  blood sugar once daily, document, and provide at future appointments o Contact provider with any episodes of hypoglycemia  Medication management . Pharmacist Clinical Goal(s): o Over the next 90 days, patient will work with PharmD and providers to maintain optimal medication adherence . Current pharmacy: [facility] . Interventions o Comprehensive medication review performed. o Continue current medication management strategy . Patient self care activities - Over the next 90 days, patient will: o Focus on medication adherence by continuing current practices o Take medications as prescribed o Report any questions or concerns to PharmD and/or provider(s)      Hypertension   BP goal is:  <130/80  Office blood pressures are  BP Readings from Last 3 Encounters:  12/18/19 120/64  12/15/19 112/64  12/12/19 128/73   Lab Results  Component Value Date   CREATININE 0.94 12/12/2019   BUN 15 12/12/2019   GFRNONAA 55 (L) 12/12/2019   GFRAA 64 08/12/2019   NA 137 12/12/2019   K 4.0 12/12/2019   CALCIUM 9.0 12/12/2019   CO2 26 12/12/2019   Patient checks BP at home several times per month Patient home BP readings are ranging: 131/70  Patient has failed these meds in the past: NA Patient is currently controlled on the following medications:  . Amlodipine-Valsartan 10-3258m1/2 tablet daily . HCTZ 12.86m61maily  We discussed: Tolerating dose decrease well, blood pressure has been slightly higher but still within goal.   Plan  Continue current medications   AFIB   Patient is currently rhythm controlled.  Patient has failed these meds in past: NA Patient is currently controlled on the following medications: . Amiodarone 200 mg daily   We discussed: Not on anticoagulation has stayed in sinus rhythm continuously.    Plan  Continue current medications  Diabetes   Recent Relevant Labs: Lab Results  Component Value Date/Time   HGBA1C 6.0 (A) 12/15/2019 09:24 AM   HGBA1C 5.9  (H) 08/12/2019 10:00 AM   HGBA1C 5.9 (A) 01/08/2019 12:05 PM   HGBA1C 6.4 04/19/2018 10:27 AM   HGBA1C 6.4 04/19/2018 10:27 AM   HGBA1C 6.4 04/19/2018 10:27 AM   HGBA1C 6.1 12/17/2017 11:11 AM   HGBA1C 6.4 08/15/2017 10:23 AM   MICROALBUR 1.1 08/12/2019 10:00 AM   MICROALBUR 20 01/08/2019 12:05 PM   MICROALBUR 1.0 12/17/2017 11:21 AM   MICROALBUR 20 08/15/2017 10:23 AM     Checking BG: Daily Recent FBG: 123    Patient has failed these meds in past: NA Patient is currently controlled on the following medications:  . MMarland Kitchentformin XR 500 mg daily    Last diabetic Foot exam:  Lab Results  Component Value Date/Time   HMDIABEYEEXA No Retinopathy 09/25/2019 12:00 AM    Last diabetic Eye exam: No results found for: HMDIABFOOTEX   We discussed:  Metformin dose decreased due to continued weight loss. Patient doing well with lower dose, thinks she is up ~3 lbs since her visit in October.   Plan  Continue current medications   Hyperlipidemia   LDL goal < 100 given age   Lipid Panel     Component Value Date/Time  CHOL 183 08/12/2019 1000   CHOL 202 (H) 04/05/2015 1113   TRIG 69 08/12/2019 1000   HDL 76 08/12/2019 1000   HDL 103 04/05/2015 1113   LDLCALC 91 08/12/2019 1000    Hepatic Function Latest Ref Rng & Units 12/12/2019 08/12/2019 04/19/2018  Total Protein 6.5 - 8.1 g/dL 7.3 6.8 6.9  Albumin 3.5 - 5.0 g/dL 4.2 - -  AST 15 - 41 U/L 24 17 17   ALT 0 - 44 U/L 22 15 12   Alk Phosphatase 38 - 126 U/L 46 - -  Total Bilirubin 0.3 - 1.2 mg/dL 0.7 0.6 0.5     The ASCVD Risk score Mikey Bussing DC Jr., et al., 2013) failed to calculate for the following reasons:   The 2013 ASCVD risk score is only valid for ages 73 to 26   Patient has failed these meds in past: NA Patient is currently controlled on the following medications:  . Atorvastatin 5m daily  We discussed:  Plan  Continue current medications  Misc / OTC    . Bezonatate 100 mg 1-2 caps TID PRN  . Breo Elipta  Inhaler  . Vitamin D 1000 units daily  . Ferrous sulfate 325 mg twice daily  . Folic Acid 4037mcg daily  . Multivitamin daily  . Vitamin B12 1000 mcg TID   Plan  Continue current medications  Medication Management   Pt uses [facility] pharmacy for all medications Uses pill box? Yes Pt endorses 100% compliance  We discussed:   Plan  Continue current medication management strategy   Follow up[facility] 6 month [phone]

## 2020-02-11 NOTE — Patient Instructions (Signed)
Visit Information It was great speaking with you today!  Please let me know if you have any questions about our visit. Goals Addressed            This Visit's Progress   . Chronic Care Management       CARE PLAN ENTRY  Current Barriers:  . Chronic Disease Management support, education, and care coordination needs related to Hypertension, Hyperlipidemia, Diabetes, Atrial Fibrillation, and Chronic Kidney Disease   Hypertension . Pharmacist Clinical Goal(s): o Over the next 90 days, patient will work with PharmD and providers to maintain BP goal <130/80 . Current regimen:  o Amlodipine-valsartan 10/320mg   1/2 tablet daily o Hydrochlorothiazide 12.5mg  daily . Patient self care activities - Over the next 90 days, patient will: o Check BP daily, document, and provide at future appointments o Ensure daily salt intake < 2300 mg/day  Hyperlipidemia . Pharmacist Clinical Goal(s): o Over the next 90 days, patient will work with PharmD and providers to achieve LDL goal < 70 . Current regimen:  o Atorvastatin 40mg  daily . Interventions: o None . Patient self care activities - Over the next 90 days, patient will: o Continue lifestyle modifications  Diabetes . Pharmacist Clinical Goal(s): o Over the next 90 days, patient will work with PharmD and providers to maintain A1c goal <7% . Current regimen:  o Metformin 500mg  every evening . Patient self care activities - Over the next 90 days, patient will: o Check blood sugar once daily, document, and provide at future appointments o Contact provider with any episodes of hypoglycemia  Medication management . Pharmacist Clinical Goal(s): o Over the next 90 days, patient will work with PharmD and providers to maintain optimal medication adherence . Current pharmacy: [facility] . Interventions o Comprehensive medication review performed. o Continue current medication management strategy . Patient self care activities - Over the next 90 days,  patient will: o Focus on medication adherence by continuing current practices o Take medications as prescribed o Report any questions or concerns to PharmD and/or provider(s)       The patient verbalized understanding of instructions, educational materials, and care plan provided today and declined offer to receive copy of patient instructions, educational materials, and care plan.   Telephone follow up appointment with pharmacy team member scheduled for: 08/13/19 at 1:00 PM  [facility] [phone]

## 2020-03-09 DIAGNOSIS — I495 Sick sinus syndrome: Secondary | ICD-10-CM | POA: Diagnosis not present

## 2020-04-15 ENCOUNTER — Telehealth: Payer: Self-pay

## 2020-04-15 NOTE — Progress Notes (Signed)
Chronic Care Management Pharmacy Assistant   Name: VERLAINE EMBRY  MRN: [id number] DOB: [birth year]  Reason for Encounter:Hypertension Disease State Call.  Patient Questions:  1.  Have you seen any other providers since your last visit? No  2.  Any changes in your medicines or health? No   PCP : Steele Sizer, MD  Allergies:   Allergies  Allergen Reactions  . Ace Inhibitors Other (See Comments)    Pt unable to recall. Was advised to avoid    Medications: Outpatient Encounter Medications as of 04/15/2020  Medication Sig Note  . amiodarone (PACERONE) 200 MG tablet Take 100 mg by mouth daily. 09/22/2015: Taking half tablet  . amLODipine-valsartan (EXFORGE) 10-320 MG tablet Take 0.5 tablets by mouth daily.   Marland Kitchen aspirin 81 MG tablet Take 81 mg by mouth daily.   Marland Kitchen atorvastatin (LIPITOR) 40 MG tablet Take 1 tablet (40 mg total) by mouth daily.   . benzonatate (TESSALON) 100 MG capsule Take 1-2 capsules (100-200 mg total) by mouth 3 (three) times daily as needed.   . cholecalciferol (VITAMIN D) 1000 UNITS tablet Take 1,000 Units by mouth daily.    . ferrous sulfate 325 (65 FE) MG EC tablet Take 1 tablet (325 mg total) by mouth 2 (two) times daily with a meal. (Patient taking differently: Take 325 mg by mouth at bedtime.)   . fluticasone furoate-vilanterol (BREO ELLIPTA) 100-25 MCG/INH AEPB Inhale 1 puff into the lungs daily.   . folic acid (FOLVITE) 267 MCG tablet Take 400 mcg by mouth daily.   . hydrochlorothiazide (HYDRODIURIL) 12.5 MG tablet TAKE 1 TABLET EVERY DAY 09/11/2019: Pt taking every other day  . metFORMIN (GLUCOPHAGE-XR) 500 MG 24 hr tablet Take 1 tablet (500 mg total) by mouth every evening.   . Multiple Vitamins-Minerals (CENTRUM SILVER 50+WOMEN PO) Take 1 tablet by mouth daily.   . vitamin B-12 (CYANOCOBALAMIN) 1000 MCG tablet Take 1,000 mcg by mouth 3 (three) times a week. (Patient not taking: Reported on 02/10/2020)    Facility-Administered Encounter Medications as of  04/15/2020  Medication  . 0.9 %  sodium chloride infusion  . cyanocobalamin ((VITAMIN B-12)) injection 1,000 mcg    Current Diagnosis: Patient Active Problem List   Diagnosis Date Noted  . B12 deficiency 05/21/2017  . Gastrointestinal tract imaging abnormality   . Iron deficiency anemia   . Polyp of sigmoid colon   . Type 2 diabetes mellitus with stage 2 chronic kidney disease, without long-term current use of insulin ([place]) 08/03/2015  . Arthritis, degenerative 05/03/2015  . Menopausal and perimenopausal disorder 05/03/2015  . Chronic kidney disease (CKD), stage III (moderate) ([place]) 11/30/2014  . High risk medication use 11/30/2014  . Diverticulosis 11/30/2014  . Fibrocystic breast disease 11/30/2014  . Elevated LFTs 11/30/2014  . Systolic ejection murmur 12/45/8099  . Paroxysmal A-fib ([place]) 11/30/2014  . Microalbuminuria 07/31/2014  . Elevated TSH 07/31/2014  . History of cardiac pacemaker 07/31/2014  . Hypercholesteremia 08/13/2013  . Hypertension, benign 08/13/2013  . Diabetes mellitus with renal manifestations, controlled ([place]) 08/13/2013  . Avitaminosis D 04/12/2009    Goals Addressed   None    Reviewed chart prior to disease state call. Spoke with patient regarding BP  Recent Office Vitals: BP Readings from Last 3 Encounters:  12/18/19 120/64  12/15/19 112/64  12/12/19 128/73   Pulse Readings from Last 3 Encounters:  12/18/19 84  12/15/19 85  12/12/19 75    Wt Readings from Last 3 Encounters:  02/10/20 108 lb (  49 kg)  12/15/19 108 lb 3.2 oz (49.1 kg)  12/12/19 105 lb 4.8 oz (47.8 kg)     Kidney Function Lab Results  Component Value Date/Time   CREATININE 0.94 12/12/2019 09:27 AM   CREATININE 0.94 (H) 08/12/2019 10:00 AM   CREATININE 1.03 (H) 04/19/2018 10:44 AM   GFRNONAA 55 (L) 12/12/2019 09:27 AM   GFRNONAA 55 (L) 08/12/2019 10:00 AM   GFRAA 64 08/12/2019 10:00 AM    BMP Latest Ref Rng & Units 12/12/2019 08/12/2019 04/19/2018  Glucose 70 - 99  mg/dL 126(H) 108(H) 105(H)  BUN 8 - 23 mg/dL 15 9 16   Creatinine 0.44 - 1.00 mg/dL 0.94 0.94(H) 1.03(H)  BUN/Creat Ratio 6 - 22 (calc) - 10 16  Sodium 135 - 145 mmol/L 137 140 140  Potassium 3.5 - 5.1 mmol/L 4.0 4.2 4.2  Chloride 98 - 111 mmol/L 100 105 103  CO2 22 - 32 mmol/L 26 26 26   Calcium 8.9 - 10.3 mg/dL 9.0 9.3 9.3    . Current antihypertensive regimen:  ? Amlodipine-valsartan 10/320mg   1/2 tablet daily ? Hydrochlorothiazide 12.5mg  daily . How often are you checking your Blood Pressure? daily . Current home BP readings:  o Patient states her blood pressure ranges around 132/65. o Patient denies headaches,dizziness and lightheadedness. . What recent interventions/DTPs have been made by any provider to improve Blood Pressure control since last CPP Visit: None ID . Any recent hospitalizations or ED visits since last visit with CPP? No . What diet changes have been made to improve Blood Pressure Control?  o None ID . What exercise is being done to improve your Blood Pressure Control?  o Patient states she hasn't walk due to the weather ,but once it is warmer she will start back walking.  Adherence Review: Is the patient currently on ACE/ARB medication? Yes Does the patient have >5 day gap between last estimated fill dates? Yes   Maryjean Ka  Follow-Up:  Pharmacist Review   [place] Clinical Pharmacist Assistant [phone]

## 2020-04-19 NOTE — Progress Notes (Unsigned)
Name: Emma Campbell   MRN: [id number]    DOB: 04-12-1932   Date:04/20/2020       Progress Note  Subjective  Chief Complaint  Follow Up  HPI  DMII with renal manifestation: CKI and microalbuminuria. Diagnosed many years ago. A1C has been well controlled, it was 6.1 % today    Glucose at home is getting checked a couple of times weekly , values in the 130's She denies polyphagia, polydipsia or polyuria - she has nocturia once per day . She denies hypoglycemic episodes. She is down to 500 mg of Metformin and does not want to stop it, she states it keeps her sugar under control.  Continue ARB for kidney protection.Eye exam is up to date. She is on statin therapy for dyslipidemia but LDL not at goal, discussed going up on the dose .   HTN: taking medicationdaily and bp is at goal, no dizziness, chest pain or palpitation.BP has been 130's/mid 60's  She tried to stop HCTZ but caused some swelling but controlled with taking it a few times a week. We had discussed adjusting the dose of Exforge but she is still taking 10/320 mg and is prescribed by Dr. Clayborn Bigness   Hyperlipidemia:shehas been on Atorvastatin last LDL still not at goal, we will adjust dose to 80 mg daily  Sick Sinus Syndrome: she had a pacemaker placed in 2006and has been onAmiodarone since 2006, she is now down to 100 mg of Amiodarone daily, last TSH still normal. No chest pain or palpitation.Sees Dr. Clayborn Bigness , she is [name] score of 2, and is on aspirin onlyper his recommendation. She also has a history of Afib that has been rate controlled. She also has a history of AVM's and not good candidate for blood thinners Denies rectal bleeding   Vitamin D deficiency: still taking supplementation, denies fatigue,last Vitamin D was normal    Osteopenia:last FRAX 09/2019 was good risk of major fracture was 4.6 % and hip was 1.1 % , on vitamin D only   Anemia: iron deficiency and positive hemoccult, seen by Dr. Durwin Reges andhadEGD and  colonoscopy September 2018,diagnosed with AVM small bowel and had it cauterized,hemoglobin dropped and was referred to Dr. Tasia Catchings, last labs showed high B12 and no anemia, she was advised to stopped supplementation and has a follow coming up in April with Dr. Tasia Catchings    Patient Active Problem List   Diagnosis Date Noted  . B12 deficiency 05/21/2017  . Gastrointestinal tract imaging abnormality   . Iron deficiency anemia   . Polyp of sigmoid colon   . Type 2 diabetes mellitus with stage 2 chronic kidney disease, without long-term current use of insulin ([place]) 08/03/2015  . Arthritis, degenerative 05/03/2015  . Menopausal and perimenopausal disorder 05/03/2015  . Chronic kidney disease (CKD), stage III (moderate) ([place]) 11/30/2014  . High risk medication use 11/30/2014  . Diverticulosis 11/30/2014  . Fibrocystic breast disease 11/30/2014  . Elevated LFTs 11/30/2014  . Systolic ejection murmur 52/77/8242  . Paroxysmal A-fib ([place]) 11/30/2014  . Microalbuminuria 07/31/2014  . Elevated TSH 07/31/2014  . History of cardiac pacemaker 07/31/2014  . Hypercholesteremia 08/13/2013  . Hypertension, benign 08/13/2013  . Diabetes mellitus with renal manifestations, controlled ([place]) 08/13/2013  . Avitaminosis D 04/12/2009    Past Surgical History:  Procedure Laterality Date  . CATARACT EXTRACTION W/PHACO Right 01/07/2019   Procedure: CATARACT EXTRACTION PHACO AND INTRAOCULAR LENS PLACEMENT (IOC) RIGHT DIABETIC 02:15.1          25.7%  34.69;  Surgeon: Birder Robson, MD;  Location: [facility];  Service: Ophthalmology;  Laterality: Right;  Diabetic - oral meds  . CATARACT EXTRACTION W/PHACO Left 01/28/2019   Procedure: CATARACT EXTRACTION PHACO AND INTRAOCULAR LENS PLACEMENT (IOC) LEFT DIABETIC 12.96  01:15.5;  Surgeon: Birder Robson, MD;  Location: [facility];  Service: Ophthalmology;  Laterality: Left;  Diabetic - oral meds  . COLONOSCOPY WITH PROPOFOL N/A 11/06/2016    Procedure: COLONOSCOPY WITH PROPOFOL;  Surgeon: Lucilla Lame, MD;  Location: [facility];  Service: Endoscopy;  Laterality: N/A;  DIABETIC  . ESOPHAGOGASTRODUODENOSCOPY (EGD) WITH PROPOFOL N/A 11/06/2016   Procedure: ESOPHAGOGASTRODUODENOSCOPY (EGD) WITH PROPOFOL;  Surgeon: Lucilla Lame, MD;  Location: [facility];  Service: Endoscopy;  Laterality: N/A;  . ESOPHAGOGASTRODUODENOSCOPY (EGD) WITH PROPOFOL N/A 01/23/2017   Procedure: ESOPHAGOGASTRODUODENOSCOPY (EGD) WITH PROPOFOL with PUSH;  Surgeon: Lucilla Lame, MD;  Location: [facility] ENDOSCOPY;  Service: Endoscopy;  Laterality: N/A;  . GIVENS CAPSULE STUDY N/A 12/22/2016   Procedure: GIVENS CAPSULE STUDY;  Surgeon: Lucilla Lame, MD;  Location: [facility] ENDOSCOPY;  Service: Endoscopy;  Laterality: N/A;  . INSERT / REPLACE / REMOVE PACEMAKER    . PACEMAKER INSERTION  2006  . POLYPECTOMY  11/06/2016   Procedure: POLYPECTOMY;  Surgeon: Lucilla Lame, MD;  Location: [place];  Service: Endoscopy;;    Family History  Problem Relation Age of Onset  . Hypertension Mother   . Stroke Mother   . Diabetes Daughter   . Stroke Father   . Healthy Sister   . Hypertension Maternal Grandmother     Social History   Tobacco Use  . Smoking status: Never Smoker  . Smokeless tobacco: Never Used  . Tobacco comment: smoking cessation materials not required  Substance Use Topics  . Alcohol use: No    Alcohol/week: 0.0 standard drinks     Current Outpatient Medications:  .  amiodarone (PACERONE) 200 MG tablet, Take 100 mg by mouth daily., Disp: , Rfl:  .  amLODipine-valsartan (EXFORGE) 10-320 MG tablet, Take 0.5 tablets by mouth daily., Disp: 10 tablet, Rfl: 0 .  aspirin 81 MG tablet, Take 81 mg by mouth daily., Disp: , Rfl:  .  cholecalciferol (VITAMIN D) 1000 UNITS tablet, Take 1,000 Units by mouth daily. , Disp: , Rfl:  .  folic acid (FOLVITE) 030 MCG tablet, Take 400 mcg by mouth daily., Disp: , Rfl:  .  hydrochlorothiazide  (HYDRODIURIL) 12.5 MG tablet, TAKE 1 TABLET EVERY DAY, Disp: 90 tablet, Rfl: 0 .  metFORMIN (GLUCOPHAGE-XR) 500 MG 24 hr tablet, Take 1 tablet (500 mg total) by mouth every evening., Disp: 90 tablet, Rfl: 1 .  Multiple Vitamins-Minerals (CENTRUM SILVER 50+WOMEN PO), Take 1 tablet by mouth daily., Disp: , Rfl:  .  atorvastatin (LIPITOR) 80 MG tablet, Take 1 tablet (80 mg total) by mouth daily., Disp: 90 tablet, Rfl: 1  Allergies  Allergen Reactions  . Ace Inhibitors Other (See Comments)    Pt unable to recall. Was advised to avoid    I personally reviewed active problem list, medication list, allergies, family history, social history, health maintenance, notes from last encounter with the patient/caregiver today.   ROS  Constitutional: Negative for fever or weight change.  Respiratory: Negative for cough and shortness of breath.   Cardiovascular: Negative for chest pain or palpitations.  Gastrointestinal: Negative for abdominal pain, no bowel changes.  Musculoskeletal: Negative for gait problem or joint swelling.  Skin: Negative for rash.  Neurological: Negative for dizziness or headache.  No other specific complaints in a complete review of systems (except as listed in HPI above).  Objective  Vitals:   04/20/20 0919  BP: 122/74  Pulse: 84  Resp: 16  Temp: 98.3 F (36.8 C)  TempSrc: Oral  SpO2: 97%  Weight: 112 lb 8 oz (51 kg)  Height: 5\' 1"  (1.549 m)    Body mass index is 21.26 kg/m.  Physical Exam  Constitutional: Patient appears well-developed and well-nourished.  No distress.  HEENT: head atraumatic, normocephalic, pupils equal and reactive to light,neck supple Cardiovascular: Normal rate, regular rhythm and normal heart sounds.  3/6  murmur heard. No BLE edema. Pulmonary/Chest: Effort normal and breath sounds normal. No respiratory distress. Abdominal: Soft.  There is no tenderness. Psychiatric: Patient has a normal mood and affect. behavior is normal. Judgment  and thought content normal.  Recent Results (from the past 2160 hour(s))  POCT HgB A1C     Status: Abnormal   Collection Time: 04/20/20  9:31 AM  Result Value Ref Range   Hemoglobin A1C 6.1 (A) 4.0 - 5.6 %   HbA1c POC (<> result, manual entry)     HbA1c, POC (prediabetic range)     HbA1c, POC (controlled diabetic range)      PHQ2/9: Depression screen [facility] 2/9 04/20/2020 02/10/2020 12/15/2019 09/11/2019 08/12/2019  Decreased Interest 0 0 0 0 0  Down, Depressed, Hopeless 0 0 0 0 0  PHQ - 2 Score 0 0 0 0 0  Altered sleeping - - - - 0  Tired, decreased energy - - - - 0  Change in appetite - - - - 0  Feeling bad or failure about yourself  - - - - 0  Trouble concentrating - - - - 0  Moving slowly or fidgety/restless - - - - 0  Suicidal thoughts - - - - 0  PHQ-9 Score - - - - 0  Difficult doing work/chores - - - - -  Some recent data might be hidden    phq 9 is negative   Fall Risk: Fall Risk  04/20/2020 02/10/2020 12/15/2019 09/11/2019 08/12/2019  Falls in the past year? 0 0 0 0 0  Number falls in past yr: 0 0 0 0 0  Injury with Fall? 0 0 0 0 -  Comment - - - - -  Risk for fall due to : - - - No Fall Risks -  Risk for fall due to: Comment - - - - -  Follow up - - - Falls prevention discussed -    Functional Status Survey: Is the patient deaf or have difficulty hearing?: No Does the patient have difficulty seeing, even when wearing glasses/contacts?: No Does the patient have difficulty concentrating, remembering, or making decisions?: No Does the patient have difficulty walking or climbing stairs?: No Does the patient have difficulty dressing or bathing?: No Does the patient have difficulty doing errands alone such as visiting a doctor's office or shopping?: No   Assessment & Plan  1. Type 2 diabetes mellitus with stage 2 chronic kidney disease, without long-term current use of insulin (HCC)  - POCT HgB A1C  2. Dyslipidemia  - atorvastatin (LIPITOR) 80 MG tablet; Take 1  tablet (80 mg total) by mouth daily.  Dispense: 90 tablet; Refill: 1  3. B12 deficiency  Off supplementation due to high dose   4. Pacemaker   5. Sick sinus syndrome (HCC)  Keep follow up with Dr. Clayborn Bigness   6. Paroxysmal A-fib (HCC)  Continue aspirin  7. Hypertension, benign  bp is at goal   8. Hypertension associated with stage 3 chronic kidney disease due to type 2 diabetes mellitus ([place])   9. Vitamin D deficiency  Continue supplementation   10. Type 2 diabetes mellitus with stage 3a chronic kidney disease, without long-term current use of insulin ([place])   11. Dyslipidemia associated with type 2 diabetes mellitus (HCC)  - atorvastatin (LIPITOR) 80 MG tablet; Take 1 tablet (80 mg total) by mouth daily.  Dispense: 90 tablet; Refill: 1

## 2020-04-20 ENCOUNTER — Ambulatory Visit (INDEPENDENT_AMBULATORY_CARE_PROVIDER_SITE_OTHER): Payer: Medicare HMO | Admitting: Family Medicine

## 2020-04-20 ENCOUNTER — Other Ambulatory Visit: Payer: Self-pay

## 2020-04-20 ENCOUNTER — Encounter: Payer: Self-pay | Admitting: Family Medicine

## 2020-04-20 VITALS — BP 122/74 | HR 84 | Temp 98.3°F | Resp 16 | Ht 61.0 in | Wt 112.5 lb

## 2020-04-20 DIAGNOSIS — N183 Chronic kidney disease, stage 3 unspecified: Secondary | ICD-10-CM

## 2020-04-20 DIAGNOSIS — E1122 Type 2 diabetes mellitus with diabetic chronic kidney disease: Secondary | ICD-10-CM

## 2020-04-20 DIAGNOSIS — I495 Sick sinus syndrome: Secondary | ICD-10-CM | POA: Diagnosis not present

## 2020-04-20 DIAGNOSIS — I480 Paroxysmal atrial fibrillation: Secondary | ICD-10-CM

## 2020-04-20 DIAGNOSIS — I10 Essential (primary) hypertension: Secondary | ICD-10-CM | POA: Diagnosis not present

## 2020-04-20 DIAGNOSIS — I129 Hypertensive chronic kidney disease with stage 1 through stage 4 chronic kidney disease, or unspecified chronic kidney disease: Secondary | ICD-10-CM

## 2020-04-20 DIAGNOSIS — N1831 Chronic kidney disease, stage 3a: Secondary | ICD-10-CM

## 2020-04-20 DIAGNOSIS — E559 Vitamin D deficiency, unspecified: Secondary | ICD-10-CM | POA: Diagnosis not present

## 2020-04-20 DIAGNOSIS — E538 Deficiency of other specified B group vitamins: Secondary | ICD-10-CM | POA: Diagnosis not present

## 2020-04-20 DIAGNOSIS — Z950 Presence of cardiac pacemaker: Secondary | ICD-10-CM

## 2020-04-20 DIAGNOSIS — E785 Hyperlipidemia, unspecified: Secondary | ICD-10-CM

## 2020-04-20 DIAGNOSIS — N182 Chronic kidney disease, stage 2 (mild): Secondary | ICD-10-CM | POA: Diagnosis not present

## 2020-04-20 DIAGNOSIS — E1169 Type 2 diabetes mellitus with other specified complication: Secondary | ICD-10-CM

## 2020-04-20 LAB — POCT GLYCOSYLATED HEMOGLOBIN (HGB A1C): Hemoglobin A1C: 6.1 % — AB (ref 4.0–5.6)

## 2020-04-20 MED ORDER — ATORVASTATIN CALCIUM 80 MG PO TABS: 80.0000 mg | ORAL_TABLET | Freq: Every day | ORAL | 1 refills | Status: DC

## 2020-04-22 DIAGNOSIS — R011 Cardiac murmur, unspecified: Secondary | ICD-10-CM | POA: Diagnosis not present

## 2020-04-22 DIAGNOSIS — Z8679 Personal history of other diseases of the circulatory system: Secondary | ICD-10-CM | POA: Diagnosis not present

## 2020-04-22 DIAGNOSIS — E7800 Pure hypercholesterolemia, unspecified: Secondary | ICD-10-CM | POA: Diagnosis not present

## 2020-04-22 DIAGNOSIS — I10 Essential (primary) hypertension: Secondary | ICD-10-CM | POA: Diagnosis not present

## 2020-04-22 DIAGNOSIS — E1122 Type 2 diabetes mellitus with diabetic chronic kidney disease: Secondary | ICD-10-CM | POA: Diagnosis not present

## 2020-04-22 DIAGNOSIS — I4891 Unspecified atrial fibrillation: Secondary | ICD-10-CM | POA: Diagnosis not present

## 2020-04-22 DIAGNOSIS — Z950 Presence of cardiac pacemaker: Secondary | ICD-10-CM | POA: Diagnosis not present

## 2020-04-22 DIAGNOSIS — N1831 Chronic kidney disease, stage 3a: Secondary | ICD-10-CM | POA: Diagnosis not present

## 2020-04-29 ENCOUNTER — Other Ambulatory Visit: Payer: Self-pay

## 2020-04-29 DIAGNOSIS — N182 Chronic kidney disease, stage 2 (mild): Secondary | ICD-10-CM

## 2020-04-29 DIAGNOSIS — E785 Hyperlipidemia, unspecified: Secondary | ICD-10-CM

## 2020-04-29 DIAGNOSIS — I10 Essential (primary) hypertension: Secondary | ICD-10-CM

## 2020-04-29 DIAGNOSIS — E1169 Type 2 diabetes mellitus with other specified complication: Secondary | ICD-10-CM

## 2020-04-29 MED ORDER — METFORMIN HCL ER 500 MG PO TB24: 500.0000 mg | ORAL_TABLET | Freq: Every evening | ORAL | 1 refills | Status: DC

## 2020-04-29 MED ORDER — ATORVASTATIN CALCIUM 80 MG PO TABS: 80.0000 mg | ORAL_TABLET | Freq: Every day | ORAL | 1 refills | Status: DC

## 2020-04-29 MED ORDER — AMLODIPINE BESYLATE-VALSARTAN 10-320 MG PO TABS: 0.5000 | ORAL_TABLET | Freq: Every day | ORAL | 0 refills | Status: DC

## 2020-04-29 MED ORDER — ACCU-CHEK GUIDE ME W/DEVICE KIT: 1.0000 | PACK | Freq: Every day | 0 refills | Status: DC

## 2020-04-29 MED ORDER — HYDROCHLOROTHIAZIDE 12.5 MG PO TABS
12.5000 mg | ORAL_TABLET | Freq: Every day | ORAL | 1 refills | Status: DC
Start: 2020-04-29 — End: 2020-05-03

## 2020-04-29 MED ORDER — ACCU-CHEK SOFTCLIX LANCETS MISC: 100.0000 | Freq: Every day | 2 refills | Status: DC

## 2020-04-29 MED ORDER — GLUCOSE BLOOD VI STRP: 1.0000 | ORAL_STRIP | Freq: Every day | 2 refills | Status: DC

## 2020-05-03 ENCOUNTER — Other Ambulatory Visit: Payer: Self-pay

## 2020-05-03 DIAGNOSIS — E1169 Type 2 diabetes mellitus with other specified complication: Secondary | ICD-10-CM

## 2020-05-03 DIAGNOSIS — E785 Hyperlipidemia, unspecified: Secondary | ICD-10-CM

## 2020-05-03 DIAGNOSIS — N182 Chronic kidney disease, stage 2 (mild): Secondary | ICD-10-CM

## 2020-05-03 MED ORDER — ATORVASTATIN CALCIUM 80 MG PO TABS
80.0000 mg | ORAL_TABLET | Freq: Every day | ORAL | 1 refills | Status: DC
Start: 2020-05-03 — End: 2020-09-17

## 2020-05-03 MED ORDER — ALCOHOL SWABS PADS: 1.0000 | MEDICATED_PAD | Freq: Three times a day (TID) | 3 refills | Status: DC | PRN

## 2020-05-03 MED ORDER — HYDROCHLOROTHIAZIDE 12.5 MG PO TABS: 12.5000 mg | ORAL_TABLET | Freq: Every day | ORAL | 1 refills | Status: DC

## 2020-05-03 MED ORDER — ACCU-CHEK GUIDE ME W/DEVICE KIT: 1.0000 | PACK | Freq: Every day | 0 refills | Status: AC

## 2020-05-03 MED ORDER — METFORMIN HCL ER 500 MG PO TB24: 500.0000 mg | ORAL_TABLET | Freq: Every evening | ORAL | 1 refills | Status: DC

## 2020-06-03 DIAGNOSIS — I2510 Atherosclerotic heart disease of native coronary artery without angina pectoris: Secondary | ICD-10-CM | POA: Diagnosis not present

## 2020-06-03 DIAGNOSIS — I4891 Unspecified atrial fibrillation: Secondary | ICD-10-CM | POA: Diagnosis not present

## 2020-06-03 DIAGNOSIS — E7800 Pure hypercholesterolemia, unspecified: Secondary | ICD-10-CM | POA: Diagnosis not present

## 2020-06-03 DIAGNOSIS — I6523 Occlusion and stenosis of bilateral carotid arteries: Secondary | ICD-10-CM | POA: Diagnosis not present

## 2020-06-03 DIAGNOSIS — Z8679 Personal history of other diseases of the circulatory system: Secondary | ICD-10-CM | POA: Diagnosis not present

## 2020-06-07 ENCOUNTER — Inpatient Hospital Stay: Payer: Medicare HMO | Attending: Oncology

## 2020-06-07 ENCOUNTER — Inpatient Hospital Stay: Payer: Medicare HMO | Admitting: Oncology

## 2020-06-07 ENCOUNTER — Other Ambulatory Visit: Payer: Self-pay

## 2020-06-07 ENCOUNTER — Encounter: Payer: Self-pay | Admitting: Oncology

## 2020-06-07 VITALS — BP 149/67 | HR 82 | Temp 98.0°F | Resp 20 | Wt 113.8 lb

## 2020-06-07 DIAGNOSIS — D509 Iron deficiency anemia, unspecified: Secondary | ICD-10-CM | POA: Diagnosis not present

## 2020-06-07 DIAGNOSIS — E538 Deficiency of other specified B group vitamins: Secondary | ICD-10-CM | POA: Diagnosis not present

## 2020-06-07 DIAGNOSIS — I129 Hypertensive chronic kidney disease with stage 1 through stage 4 chronic kidney disease, or unspecified chronic kidney disease: Secondary | ICD-10-CM | POA: Insufficient documentation

## 2020-06-07 DIAGNOSIS — Z8249 Family history of ischemic heart disease and other diseases of the circulatory system: Secondary | ICD-10-CM | POA: Diagnosis not present

## 2020-06-07 DIAGNOSIS — Z833 Family history of diabetes mellitus: Secondary | ICD-10-CM | POA: Insufficient documentation

## 2020-06-07 DIAGNOSIS — D631 Anemia in chronic kidney disease: Secondary | ICD-10-CM | POA: Diagnosis not present

## 2020-06-07 DIAGNOSIS — N1831 Chronic kidney disease, stage 3a: Secondary | ICD-10-CM

## 2020-06-07 DIAGNOSIS — E1122 Type 2 diabetes mellitus with diabetic chronic kidney disease: Secondary | ICD-10-CM | POA: Diagnosis not present

## 2020-06-07 DIAGNOSIS — Z79899 Other long term (current) drug therapy: Secondary | ICD-10-CM | POA: Diagnosis not present

## 2020-06-07 DIAGNOSIS — N183 Chronic kidney disease, stage 3 unspecified: Secondary | ICD-10-CM | POA: Diagnosis not present

## 2020-06-07 DIAGNOSIS — Z7984 Long term (current) use of oral hypoglycemic drugs: Secondary | ICD-10-CM | POA: Insufficient documentation

## 2020-06-07 DIAGNOSIS — D500 Iron deficiency anemia secondary to blood loss (chronic): Secondary | ICD-10-CM

## 2020-06-07 DIAGNOSIS — Z8639 Personal history of other endocrine, nutritional and metabolic disease: Secondary | ICD-10-CM

## 2020-06-07 DIAGNOSIS — Z823 Family history of stroke: Secondary | ICD-10-CM | POA: Insufficient documentation

## 2020-06-07 LAB — COMPREHENSIVE METABOLIC PANEL
ALT: 25 U/L (ref 0–44)
AST: 24 U/L (ref 15–41)
Albumin: 4 g/dL (ref 3.5–5.0)
Alkaline Phosphatase: 51 U/L (ref 38–126)
Anion gap: 10 (ref 5–15)
BUN: 17 mg/dL (ref 8–23)
CO2: 25 mmol/L (ref 22–32)
Calcium: 8.8 mg/dL — ABNORMAL LOW (ref 8.9–10.3)
Chloride: 105 mmol/L (ref 98–111)
Creatinine, Ser: 0.93 mg/dL (ref 0.44–1.00)
GFR, Estimated: 60 mL/min — ABNORMAL LOW (ref >60)
Glucose, Bld: 125 mg/dL — ABNORMAL HIGH (ref 70–99)
Potassium: 3.8 mmol/L (ref 3.5–5.1)
Sodium: 140 mmol/L (ref 135–145)
Total Bilirubin: 0.7 mg/dL (ref 0.3–1.2)
Total Protein: 7.2 g/dL (ref 6.5–8.1)

## 2020-06-07 LAB — IRON AND TIBC
Iron: 74 ug/dL (ref 28–170)
Saturation Ratios: 25 % (ref 10.4–31.8)
TIBC: 297 ug/dL (ref 250–450)
UIBC: 223 ug/dL

## 2020-06-07 LAB — CBC WITH DIFFERENTIAL/PLATELET
Abs Immature Granulocytes: 0 10*3/uL (ref 0.00–0.07)
Basophils Absolute: 0 10*3/uL (ref 0.0–0.1)
Basophils Relative: 1 %
Eosinophils Absolute: 0.1 10*3/uL (ref 0.0–0.5)
Eosinophils Relative: 4 %
HCT: 35.1 % — ABNORMAL LOW (ref 36.0–46.0)
Hemoglobin: 11.4 g/dL — ABNORMAL LOW (ref 12.0–15.0)
Immature Granulocytes: 0 %
Lymphocytes Relative: 43 %
Lymphs Abs: 1.5 10*3/uL (ref 0.7–4.0)
MCH: 29.9 pg (ref 26.0–34.0)
MCHC: 32.5 g/dL (ref 30.0–36.0)
MCV: 92.1 fL (ref 80.0–100.0)
Monocytes Absolute: 0.3 10*3/uL (ref 0.1–1.0)
Monocytes Relative: 7 %
Neutro Abs: 1.6 10*3/uL — ABNORMAL LOW (ref 1.7–7.7)
Neutrophils Relative %: 45 %
Platelets: 195 10*3/uL (ref 150–400)
RBC: 3.81 MIL/uL — ABNORMAL LOW (ref 3.87–5.11)
RDW: 12.9 % (ref 11.5–15.5)
WBC: 3.5 10*3/uL — ABNORMAL LOW (ref 4.0–10.5)
nRBC: 0 % (ref 0.0–0.2)

## 2020-06-07 LAB — VITAMIN B12: Vitamin B-12: 250 pg/mL (ref 180–914)

## 2020-06-07 LAB — FERRITIN: Ferritin: 114 ng/mL (ref 11–307)

## 2020-06-07 NOTE — Progress Notes (Signed)
Hematology/Oncology follow up note [facility] [phone] [phone]   Patient Care Team: Steele Sizer, MD as PCP - General (Family Medicine) Yolonda Kida, MD as Consulting Physician (Cardiology) Earlie Server, MD as Consulting Physician (Oncology) Germaine Pomfret, [facility] as Pharmacist (Pharmacist)  REFERRING PROVIDER: Steele Sizer, MD REASON FOR VISIT Follow up for treatment of anemia  HISTORY OF PRESENTING ILLNESS:  AURIELLA Campbell is a  87 y.o.  female with PMH listed below who was referred to me for evaluation of anemia.  Patient follows up with primary care physician and has had labs done recently. 04/17/17 CBC showed hemoglobin 9, MCV 81.9, platelet 245,000, WBC 4.1, normal differential.  Iron panel showed TIBC 442, ferritin 11, saturation 15%. Previous GI workup:  # 01/23/2017 EGD showed non bleeding angiotecsia, treated with APC. 12/22/2016 Capsule study showed duodenum AVM.  11/06/2016 colonoscopy showed non bleeding hemorroids, sigmoid polyp, and diverticulosis.   INTERVAL HISTORY Emma Campbell is a 87 y.o. female who has above history reviewed by me today presents for follow up for management of iron deficiency anemia.  Patient takes oral iron supplementation.  No new complaints.  She tolerates oral iron supplementation well.  Review of Systems  Constitutional: Negative for chills, fever, malaise/fatigue and weight loss.  HENT: Negative for sore throat.   Eyes: Negative for redness.  Respiratory: Negative for cough, shortness of breath and wheezing.   Cardiovascular: Negative for chest pain, palpitations and leg swelling.  Gastrointestinal: Negative for abdominal pain, blood in stool, nausea and vomiting.  Genitourinary: Negative for dysuria.  Musculoskeletal: Negative for myalgias.  Skin: Negative for rash.  Neurological: Negative for dizziness, tingling and tremors.  Endo/Heme/Allergies: Does not bruise/bleed easily.   Psychiatric/Behavioral: Negative for hallucinations.    MEDICAL HISTORY:  Past Medical History:  Diagnosis Date  . Anemia   . Chronic kidney disease, stage III (moderate) (HCC)   . Diverticulosis   . Elevated LFTs   . Essential hypertension, malignant   . Fibrocystic breast disease   . Heart murmur   . Hyperlipidemia   . Menopausal problem   . Osteoarthrosis   . Osteoporosis   . Peripheral autonomic neuropathy   . Presence of permanent cardiac pacemaker   . Sick sinus syndrome (HCC)    Dr. Alfredo Batty  . Type II diabetes mellitus with renal manifestations ([place])   . Vitamin D deficiency     SURGICAL HISTORY: Past Surgical History:  Procedure Laterality Date  . CATARACT EXTRACTION W/PHACO Right 01/07/2019   Procedure: CATARACT EXTRACTION PHACO AND INTRAOCULAR LENS PLACEMENT (IOC) RIGHT DIABETIC 02:15.1          25.7%        34.69;  Surgeon: Birder Robson, MD;  Location: [place];  Service: Ophthalmology;  Laterality: Right;  Diabetic - oral meds  . CATARACT EXTRACTION W/PHACO Left 01/28/2019   Procedure: CATARACT EXTRACTION PHACO AND INTRAOCULAR LENS PLACEMENT (IOC) LEFT DIABETIC 12.96  01:15.5;  Surgeon: Birder Robson, MD;  Location: [place];  Service: Ophthalmology;  Laterality: Left;  Diabetic - oral meds  . COLONOSCOPY WITH PROPOFOL N/A 11/06/2016   Procedure: COLONOSCOPY WITH PROPOFOL;  Surgeon: Lucilla Lame, MD;  Location: [place];  Service: Endoscopy;  Laterality: N/A;  DIABETIC  . ESOPHAGOGASTRODUODENOSCOPY (EGD) WITH PROPOFOL N/A 11/06/2016   Procedure: ESOPHAGOGASTRODUODENOSCOPY (EGD) WITH PROPOFOL;  Surgeon: Lucilla Lame, MD;  Location: [place];  Service: Endoscopy;  Laterality: N/A;  . ESOPHAGOGASTRODUODENOSCOPY (EGD) WITH PROPOFOL N/A 01/23/2017  Procedure: ESOPHAGOGASTRODUODENOSCOPY (EGD) WITH PROPOFOL with PUSH;  Surgeon: Lucilla Lame, MD;  Location: [facility] ENDOSCOPY;  Service: Endoscopy;  Laterality: N/A;   . GIVENS CAPSULE STUDY N/A 12/22/2016   Procedure: GIVENS CAPSULE STUDY;  Surgeon: Lucilla Lame, MD;  Location: [facility] END[facility];  Service: Endoscopy;  Laterality: N/A;  . INSERT / REPLACE / REMOVE PACEMAKER    . PACEMAKER INSERTION  2006  . POLYPECTOMY  11/06/2016   Procedure: POLYPECTOMY;  Surgeon: Lucilla Lame, MD;  Location: [place];  Service: Endoscopy;;    SOCIAL HISTORY: Social History   Socioeconomic History  . Marital status: Widowed    Spouse name: Shanon Brow  . Number of children: 1  . Years of education: 2 years of college  . Highest education level: 12th grade  Occupational History  . Occupation: Retired Agricultural engineer at [employer]  . Smoking status: Never Smoker  . Smokeless tobacco: Never Used  . Tobacco comment: smoking cessation materials not required  Vaping Use  . Vaping Use: Never used  Substance and Sexual Activity  . Alcohol use: No    Alcohol/week: 0.0 standard drinks  . Drug use: No  . Sexual activity: Never  Other Topics Concern  . Not on file  Social History Narrative   She still has a house but her daughter and grand-daughter are staying at her house   She moved in with her older sister ( 71 years older), to help her out.    Social Determinants of Health   Financial Resource Strain: Low Risk   . Difficulty of Paying Living Expenses: Not hard at all  Food Insecurity: No Food Insecurity  . Worried About Charity fundraiser in the Last Year: Never true  . Ran Out of Food in the Last Year: Never true  Transportation Needs: No Transportation Needs  . Lack of Transportation (Medical): No  . Lack of Transportation (Non-Medical): No  Physical Activity: Sufficiently Active  . Days of Exercise per Week: 7 days  . Minutes of Exercise per Session: 30 min  Stress: No Stress Concern Present  . Feeling of Stress : Not at all  Social Connections: Moderately Integrated  . Frequency of Communication with Friends and Family: More than  three times a week  . Frequency of Social Gatherings with Friends and Family: More than three times a week  . Attends Religious Services: More than 4 times per year  . Active Member of Clubs or Organizations: Yes  . Attends Archivist Meetings: Never  . Marital Status: Widowed  Intimate Partner Violence: Not At Risk  . Fear of Current or Ex-Partner: No  . Emotionally Abused: No  . Physically Abused: No  . Sexually Abused: No    FAMILY HISTORY: Family History  Problem Relation Age of Onset  . Hypertension Mother   . Stroke Mother   . Diabetes Daughter   . Stroke Father   . Healthy Sister   . Hypertension Maternal Grandmother     ALLERGIES:  is allergic to ace inhibitors.  MEDICATIONS:  Current Outpatient Medications  Medication Sig Dispense Refill  . Accu-Chek Softclix Lancets lancets 100 each by Other route daily. Use as instructed 100 each 2  . Alcohol Swabs PADS 1 Package by Does not apply route 3 (three) times daily as needed. 1 each 3  . amiodarone (PACERONE) 200 MG tablet Take 100 mg by mouth daily.    Marland Kitchen amLODipine-valsartan (EXFORGE) 10-320 MG tablet Take 0.5 tablets by mouth daily.  10 tablet 0  . aspirin 81 MG tablet Take 81 mg by mouth daily.    Marland Kitchen atorvastatin (LIPITOR) 80 MG tablet Take 1 tablet (80 mg total) by mouth daily. 90 tablet 1  . Blood Glucose Monitoring Suppl (ACCU-CHEK GUIDE ME) w/Device KIT 1 each by Does not apply route daily. 1 kit 0  . cholecalciferol (VITAMIN D) 1000 UNITS tablet Take 1,000 Units by mouth daily.     . folic acid (FOLVITE) 784 MCG tablet Take 400 mcg by mouth daily.    Marland Kitchen glucose blood test strip 1 each by Other route daily. Use as instructed 100 each 2  . hydrochlorothiazide (HYDRODIURIL) 12.5 MG tablet Take 1 tablet (12.5 mg total) by mouth daily. 90 tablet 1  . metFORMIN (GLUCOPHAGE-XR) 500 MG 24 hr tablet Take 1 tablet (500 mg total) by mouth every evening. 90 tablet 1  . Multiple Vitamins-Minerals (CENTRUM SILVER  50+WOMEN PO) Take 1 tablet by mouth daily.     No current facility-administered medications for this visit.     PHYSICAL EXAMINATION: ECOG PERFORMANCE STATUS: 1 - Symptomatic but completely ambulatory Vitals:   06/07/20 1010  BP: (!) 149/67  Pulse: 82  Resp: 20  Temp: 98 F (36.7 C)  SpO2: 98%   Filed Weights   06/07/20 1010  Weight: 113 lb 12.8 oz (51.6 kg)    Physical Exam Constitutional:      General: She is not in acute distress.    Appearance: She is not diaphoretic.     Comments: Thin built   HENT:     Head: Normocephalic and atraumatic.     Nose: Nose normal.     Mouth/Throat:     Pharynx: No oropharyngeal exudate.  Eyes:     General: No scleral icterus.       Left eye: No discharge.     Pupils: Pupils are equal, round, and reactive to light.  Neck:     Vascular: No JVD.  Cardiovascular:     Rate and Rhythm: Normal rate and regular rhythm.     Heart sounds: Normal heart sounds. No murmur heard. No friction rub.  Pulmonary:     Effort: Pulmonary effort is normal. No respiratory distress.     Breath sounds: Normal breath sounds. No wheezing or rales.  Chest:     Chest wall: No tenderness.  Abdominal:     General: Bowel sounds are normal. There is no distension.     Palpations: Abdomen is soft.     Tenderness: There is no abdominal tenderness.  Musculoskeletal:        General: No tenderness. Normal range of motion.     Cervical back: Normal range of motion and neck supple.  Lymphadenopathy:     Cervical: No cervical adenopathy.  Skin:    General: Skin is warm and dry.     Findings: No erythema or rash.  Neurological:     Mental Status: She is alert and oriented to person, place, and time.     Cranial Nerves: No cranial nerve deficit.     Motor: No abnormal muscle tone.     Coordination: Coordination normal.  Psychiatric:        Mood and Affect: Affect normal.        Judgment: Judgment normal.      LABORATORY DATA:  I have reviewed the data  as listed Lab Results  Component Value Date   WBC 3.5 (L) 06/07/2020   HGB 11.4 (L) 06/07/2020   HCT  35.1 (L) 06/07/2020   MCV 92.1 06/07/2020   PLT 195 06/07/2020   Recent Labs    08/12/19 1000 12/12/19 0927 06/07/20 0954  NA 140 137 140  K 4.2 4.0 3.8  CL 105 100 105  CO2 _0 GLUCOSE 108* 126* 125*  BUN _1 CREATININE 0.94* 0.94 0.93  CALCIUM 9.3 9.0 8.8*  GFRNONAA 55* 55* 60*  GFRAA 64  --   --   PROT 6.8 7.3 7.2  ALBUMIN  --  4.2 4.0  AST _2 ALT _3 ALKPHOS  --  46 51  BILITOT 0.6 0.7 0.7    Iron/TIBC/Ferritin/ %Sat    Component Value Date/Time   IRON 74 06/07/2020 0954   TIBC 297 06/07/2020 0954   FERRITIN 114 06/07/2020 0954   IRONPCTSAT 25 06/07/2020 0954   IRONPCTSAT 15 04/11/2016 1103   SPEP not observed M spike.   ASSESSMENT & PLAN:  1. Iron deficiency anemia due to chronic blood loss   2. Anemia of chronic renal failure, stage 3a ([place])   3. History of non anemic vitamin B12 deficiency    # Iron deficiency anemia, Labs are reviewed and discussed with the patient. Hemoglobin 11.4, iron panel shows stable ferritin at 114, iron saturation 25, Continue oral iron supplementation as maintenance.  #History of vitamin B12 deficiency.  B12 levels has been high and she has been off B12 supplementation B12 level is  not back at the time of dictation. .  # Anemia of CKD, hemoglobin is above 10.  At goal.  No need for intervention at this point.  Orders Placed This Encounter  Procedures  . CBC with Differential/Platelet    Standing Status:   Future    Standing Expiration Date:   06/07/2021  . Comprehensive metabolic panel    Standing Status:   Future    Standing Expiration Date:   06/07/2021  . Iron and TIBC    Standing Status:   Future    Standing Expiration Date:   06/07/2021  . Ferritin    Standing Status:   Future    Standing Expiration Date:   06/07/2021  . Vitamin B12    Standing Status:   Future    Standing Expiration  Date:   06/07/2021   All questions were answered. The patient knows to call the clinic with any problems questions or concerns.  Return of visit: 6 months.   Earlie Server, MD, PhD Hematology Oncology [facility] Pager- [phone] 06/07/2020

## 2020-06-08 DIAGNOSIS — I495 Sick sinus syndrome: Secondary | ICD-10-CM | POA: Diagnosis not present

## 2020-06-09 ENCOUNTER — Telehealth: Payer: Self-pay | Admitting: Family Medicine

## 2020-06-09 ENCOUNTER — Telehealth: Payer: Self-pay

## 2020-06-09 DIAGNOSIS — I10 Essential (primary) hypertension: Secondary | ICD-10-CM

## 2020-06-09 MED ORDER — AMLODIPINE BESYLATE-VALSARTAN 10-320 MG PO TABS: 0.5000 | ORAL_TABLET | Freq: Every day | ORAL | 0 refills | Status: DC

## 2020-06-09 NOTE — Telephone Encounter (Signed)
Requested Prescriptions  Pending Prescriptions Disp Refills  . amLODipine-valsartan (EXFORGE) 10-320 MG tablet 45 tablet 0    Sig: Take 0.5 tablets by mouth daily.     Cardiovascular: CCB + ARB Combos Failed - 06/09/2020  9:33 AM      Failed - Last BP in normal range    BP Readings from Last 1 Encounters:  06/07/20 (!) 149/67         Passed - K in normal range and within 180 days    Potassium  Date Value Ref Range Status  06/07/2020 3.8 3.5 - 5.1 mmol/L Final         Passed - Cr in normal range and within 180 days    Creat  Date Value Ref Range Status  08/12/2019 0.94 (H) 0.60 - 0.88 mg/dL Final    Comment:    For patients >72 years of age, the reference limit for Creatinine is approximately 13% higher for people identified as African-American. .    Creatinine, Ser  Date Value Ref Range Status  06/07/2020 0.93 0.44 - 1.00 mg/dL Final   Creatinine, Urine  Date Value Ref Range Status  08/12/2019 109 20 - 275 mg/dL Final         Passed - Patient is not pregnant      Passed - Valid encounter within last 6 months    Recent Outpatient Visits          1 month ago Type 2 diabetes mellitus with stage 2 chronic kidney disease, without long-term current use of insulin [facility])   [facility], Drue Stager, MD   4 months ago Dry cough   [facility] [place], Drue Stager, MD   5 months ago Type 2 diabetes mellitus with stage 2 chronic kidney disease, without long-term current use of insulin [facility])   [facility] [place], Drue Stager, MD   10 months ago Type 2 diabetes mellitus with stage 2 chronic kidney disease, without long-term current use of insulin [facility])   [facility] Steele Sizer, MD   1 year ago Paroxysmal A-fib [facility])   [facility] Steele Sizer, MD      Future Appointments            In 2 months [name], Drue Stager, MD [facility] [place]   In 3 months  [place]

## 2020-06-09 NOTE — Telephone Encounter (Signed)
Copied from [place] [id number]. Topic: Quick Communication - Rx Refill/Question >> Jun 09, 2020  9:28 AM Yvette Rack wrote: Medication: amLODipine-valsartan (EXFORGE) 10-320 MG tablet  Has the patient contacted their pharmacy? No - Pt uses mail order pharmacy   Preferred Pharmacy (with phone number or street name): [place], [place] Phone: [phone]   Fax: [phone]  Agent: Please be advised that RX refills may take up to 3 business days. We ask that you follow-up with your pharmacy.

## 2020-06-09 NOTE — Telephone Encounter (Addendum)
Unsuccessfully attempted to send electronic request for Exforge to pharmacy; spoke with Cassandra at [facility] she requests this be sent to the office for refill. Requested Prescriptions  Pending Prescriptions Disp Refills  . amLODipine-valsartan (EXFORGE) 10-320 MG tablet 45 tablet 0    Sig: Take 0.5 tablets by mouth daily.     There is no refill protocol information for this order    Signed Prescriptions Disp Refills   amLODipine-valsartan (EXFORGE) 10-320 MG tablet 45 tablet 0    Sig: Take 0.5 tablets by mouth daily.     Cardiovascular: CCB + ARB Combos Failed - 06/09/2020  9:33 AM      Failed - Last BP in normal range    BP Readings from Last 1 Encounters:  06/07/20 (!) 149/67         Passed - K in normal range and within 180 days    Potassium  Date Value Ref Range Status  06/07/2020 3.8 3.5 - 5.1 mmol/L Final         Passed - Cr in normal range and within 180 days    Creat  Date Value Ref Range Status  08/12/2019 0.94 (H) 0.60 - 0.88 mg/dL Final    Comment:    For patients >7 years of age, the reference limit for Creatinine is approximately 13% higher for people identified as African-American. .    Creatinine, Ser  Date Value Ref Range Status  06/07/2020 0.93 0.44 - 1.00 mg/dL Final   Creatinine, Urine  Date Value Ref Range Status  08/12/2019 109 20 - 275 mg/dL Final         Passed - Patient is not pregnant      Passed - Valid encounter within last 6 months    Recent Outpatient Visits          1 month ago Type 2 diabetes mellitus with stage 2 chronic kidney disease, without long-term current use of insulin [facility])   [facility] [place], Drue Stager, MD   4 months ago Dry cough   [facility], Drue Stager, MD   5 months ago Type 2 diabetes mellitus with stage 2 chronic kidney disease, without long-term current use of insulin [facility])   [facility] [place], Drue Stager, MD   10 months ago Type 2  diabetes mellitus with stage 2 chronic kidney disease, without long-term current use of insulin [facility])   [facility] Steele Sizer, MD   1 year ago Paroxysmal A-fib [facility])   [facility] Steele Sizer, MD      Future Appointments            In 2 months [name], Drue Stager, MD [facility], [place]   In 3 months  [place]

## 2020-06-09 NOTE — Telephone Encounter (Signed)
-----   Message from Earlie Server, MD sent at 06/08/2020 10:52 PM EDT ----- Please ask patient to resume oral vitamin b12 1040mcg supplementation daily.

## 2020-06-09 NOTE — Telephone Encounter (Signed)
Patient notified

## 2020-06-09 NOTE — Telephone Encounter (Signed)
Emma Campbell reached out to me and informed me that she was able to print out this prescription, so please disreguard this request. Thank ou

## 2020-06-09 NOTE — Addendum Note (Signed)
Addended by: Addison Naegeli on: 06/09/2020 09:45 AM   Modules accepted: Orders

## 2020-06-10 ENCOUNTER — Other Ambulatory Visit: Payer: Self-pay | Admitting: Family Medicine

## 2020-06-14 ENCOUNTER — Other Ambulatory Visit: Payer: Self-pay

## 2020-06-14 DIAGNOSIS — I10 Essential (primary) hypertension: Secondary | ICD-10-CM

## 2020-06-15 MED ORDER — AMLODIPINE BESYLATE-VALSARTAN 5-160 MG PO TABS: 1.0000 | ORAL_TABLET | Freq: Every day | ORAL | 0 refills | Status: DC

## 2020-06-15 NOTE — Telephone Encounter (Signed)
Pt states running 130's/60's

## 2020-06-17 ENCOUNTER — Telehealth: Payer: Self-pay

## 2020-06-17 NOTE — Progress Notes (Signed)
  Chronic Care Management Pharmacy Assistant   Name: Brieonna D Belknap  MRN: [id number] DOB: 08/11/1932  Reason for Encounter:Diabetes Disease State Call.   Recent office visits:  04/20/2020 Dr.Sowles MD (PCP) - Increase Atorvastatin from 40 mg to 80 mg Daily.  Recent consult visits:  06/07/2020 Dr.Yu MD (Oncology) 04/22/2020 Delicia White NP (Cardiology)  Hospital visits:  None in previous 6 months  Medications: Outpatient Encounter Medications as of 06/17/2020  Medication Sig Note  . Accu-Chek Softclix Lancets lancets 100 each by Other route daily. Use as instructed   . Alcohol Swabs PADS 1 Package by Does not apply route 3 (three) times daily as needed.   . amiodarone (PACERONE) 200 MG tablet Take 100 mg by mouth daily. 09/22/2015: Taking half tablet  . amLODipine-valsartan (EXFORGE) 5-160 MG tablet Take 1 tablet by mouth daily.   . aspirin 81 MG tablet Take 81 mg by mouth daily.   . atorvastatin (LIPITOR) 80 MG tablet Take 1 tablet (80 mg total) by mouth daily.   . Blood Glucose Monitoring Suppl (ACCU-CHEK GUIDE ME) w/Device KIT 1 each by Does not apply route daily.   . cholecalciferol (VITAMIN D) 1000 UNITS tablet Take 1,000 Units by mouth daily.    . folic acid (FOLVITE) 400 MCG tablet Take 400 mcg by mouth daily.   . glucose blood test strip 1 each by Other route daily. Use as instructed   . hydrochlorothiazide (HYDRODIURIL) 12.5 MG tablet Take 1 tablet (12.5 mg total) by mouth daily.   . metFORMIN (GLUCOPHAGE-XR) 500 MG 24 hr tablet Take 1 tablet (500 mg total) by mouth every evening.   . Multiple Vitamins-Minerals (CENTRUM SILVER 50+WOMEN PO) Take 1 tablet by mouth daily.    No facility-administered encounter medications on file as of 06/17/2020.   Star Rating Drugs: Atorvastatin 80 mg last filled on 05/04/2020 for 90 day supply. Metformin 500 mg last filled on 04/08/2020 for 90 day supply at [facility]. Amlodipine-Valsartan 5-160 mg last filled on 04/30/2020 for 20  day supply at [facility].  Recent Relevant Labs: Lab Results  Component Value Date/Time   HGBA1C 6.1 (A) 04/20/2020 09:31 AM   HGBA1C 6.0 (A) 12/15/2019 09:24 AM   HGBA1C 5.9 (H) 08/12/2019 10:00 AM   HGBA1C 6.4 04/19/2018 10:27 AM   HGBA1C 6.4 04/19/2018 10:27 AM   HGBA1C 6.4 04/19/2018 10:27 AM   HGBA1C 6.1 12/17/2017 11:11 AM   HGBA1C 6.4 08/15/2017 10:23 AM   MICROALBUR 1.1 08/12/2019 10:00 AM   MICROALBUR 20 01/08/2019 12:05 PM   MICROALBUR 1.0 12/17/2017 11:21 AM   MICROALBUR 20 08/15/2017 10:23 AM    Kidney Function Lab Results  Component Value Date/Time   CREATININE 0.93 06/07/2020 09:54 AM   CREATININE 0.94 12/12/2019 09:27 AM   CREATININE 0.94 (H) 08/12/2019 10:00 AM   CREATININE 1.03 (H) 04/19/2018 10:44 AM   GFRNONAA 60 (L) 06/07/2020 09:54 AM   GFRNONAA 55 (L) 08/12/2019 10:00 AM   GFRAA 64 08/12/2019 10:00 AM    . Current antihyperglycemic regimen:  ? Metformin 500mg every evening . What recent interventions/DTPs have been made to improve glycemic control:  ? Check blood sugar once daily, document, and provide at future appointments . Have there been any recent hospitalizations or ED visits since last visit with CPP? No . Patient denies hypoglycemic symptoms, including Pale, Sweaty, Shaky, Hungry, Nervous/irritable and Vision changes . Patient denies hyperglycemic symptoms, including blurry vision, excessive thirst, fatigue, polyuria and weakness . How often are you checking your   blood sugar? 2-3 times a week. . What are your blood sugars ranging?  o Fasting: N/A o Before meals:  - On 06/21/2020 it was 129. - On 06/18/2020 it was 112. - On 06/16/2020 it was 111. o After meals: N/A o Bedtime: N/A . During the week, how often does your blood glucose drop below 70? Never . Are you checking your feet daily/regularly?   Patient denies numbness, pain or tingling in her feet.  Adherence Review: Is the patient currently on a STATIN medication? Yes Is  the patient currently on ACE/ARB medication? Yes Does the patient have >5 day gap between last estimated fill dates? Yes  Patient states she receives amlodipine- Valsartan from [facility] in the begin or April for 90 day supply.   Bessie Kellihan,CPA Clinical Pharmacist Assistant [phone]     

## 2020-08-11 ENCOUNTER — Telehealth: Payer: Self-pay

## 2020-08-11 NOTE — Chronic Care Management (AMB) (Signed)
    Chronic Care Management Pharmacy Assistant   Name: Emma Campbell  MRN: [id number] DOB: 01-16-1933  06/15/222- Patient called to remind of appointment with [name], CPP on 08/12/2020 @ 1300 for a telephone visit call.  Patient aware of appointment date, time, and type of appointment (either telephone or in person). Patient aware to have/bring all medications, supplements, blood pressure and/or blood sugar logs to visit.  Questions: Have you had any recent office visit or specialist visit outside of [place]? None  Are there any concerns you would like to discuss during your office visit? None  Are you having any problems obtaining your medications? No but the patient does report although the refill history shows 04/08/2020 she stated that her pharmacy gives her extra bottles sometimes, and she still has plenty of both per patient.  If patient has any PAP medications ask if they are having any problems getting their PAP medication or refill? None  Star Rating Drug: 05/04/2020 Atorvastatin 80 mg last filled for a 90-Day supply pharmacy not listed 04/08/2020 Metformin 500 mg last filled 04/08/2020 for a 90-day supply with [facility].  Any gaps in medications fill history? Yes  Onboarding form completed and uploaded patient would be a great candidate for [facility] adherence program.  AWV appointment scheduled for 09/21/2020.  Pattricia Boss, [place] Pharmacist Assistant [phone]

## 2020-08-12 ENCOUNTER — Ambulatory Visit (INDEPENDENT_AMBULATORY_CARE_PROVIDER_SITE_OTHER): Payer: Medicare HMO

## 2020-08-12 DIAGNOSIS — E1122 Type 2 diabetes mellitus with diabetic chronic kidney disease: Secondary | ICD-10-CM

## 2020-08-12 DIAGNOSIS — I152 Hypertension secondary to endocrine disorders: Secondary | ICD-10-CM

## 2020-08-12 DIAGNOSIS — E785 Hyperlipidemia, unspecified: Secondary | ICD-10-CM

## 2020-08-12 DIAGNOSIS — E1159 Type 2 diabetes mellitus with other circulatory complications: Secondary | ICD-10-CM

## 2020-08-12 DIAGNOSIS — E1169 Type 2 diabetes mellitus with other specified complication: Secondary | ICD-10-CM

## 2020-08-12 DIAGNOSIS — N182 Chronic kidney disease, stage 2 (mild): Secondary | ICD-10-CM | POA: Diagnosis not present

## 2020-08-12 NOTE — Progress Notes (Signed)
Chronic Care Management Pharmacy Note  09/08/2020 Name:  Emma Campbell MRN:  [id number] DOB:  04-28-1932  Summary: Patient presents for follow-up today. She is in good spirits overall, although she was worried about some of her blood pressure readings at home. Her blood sugars have been well controlled recently.  Recommendations/Changes made from today's visit: Continue current medications  Plan: CPP follow-up in 6 months  Subjective: Emma Campbell is an 87 y.o. year old female who is a primary patient of Steele Sizer, MD.  The CCM team was consulted for assistance with disease management and care coordination needs.    Engaged with patient by telephone for follow up visit in response to provider referral for pharmacy case management and/or care coordination services.   Consent to Services:  The patient was given information about Chronic Care Management services, agreed to services, and gave verbal consent prior to initiation of services.  Please see initial visit note for detailed documentation.   Patient Care Team: Steele Sizer, MD as PCP - General (Family Medicine) Yolonda Kida, MD as Consulting Physician (Cardiology) Earlie Server, MD as Consulting Physician (Oncology) Germaine Pomfret, [facility] as Pharmacist (Pharmacist)  Recent office visits: 04/20/20: Patient presented to Dr. [name] for follow-up. Atorvastatin increased to 80 mg daily.   Recent consult visits: 06/07/20: Patient presented to Dr. Tasia Catchings (Oncology) 04/22/20: Patient presented to Gladstone Pih, NP (Cardiology) for follow-up. No medication changes made.   Hospital visits: None in previous 6 months   Objective:  Lab Results  Component Value Date   CREATININE 0.68 08/20/2020   BUN 14 08/20/2020   GFRNONAA 79 08/20/2020   GFRAA 92 08/20/2020   NA 139 08/20/2020   K 4.3 08/20/2020   CALCIUM 9.3 08/20/2020   CO2 28 08/20/2020   GLUCOSE 108 (H) 08/20/2020    Lab Results  Component Value Date/Time    HGBA1C 6.5 (A) 08/16/2020 01:08 PM   HGBA1C 6.1 (A) 04/20/2020 09:31 AM   HGBA1C 5.9 (H) 08/12/2019 10:00 AM   HGBA1C 6.4 04/19/2018 10:27 AM   HGBA1C 6.4 04/19/2018 10:27 AM   HGBA1C 6.4 04/19/2018 10:27 AM   HGBA1C 6.1 12/17/2017 11:11 AM   HGBA1C 6.4 08/15/2017 10:23 AM   MICROALBUR 1.5 08/20/2020 08:33 AM   MICROALBUR 1.1 08/12/2019 10:00 AM   MICROALBUR 20 01/08/2019 12:05 PM   MICROALBUR 20 08/15/2017 10:23 AM    Last diabetic Eye exam:  Lab Results  Component Value Date/Time   HMDIABEYEEXA No Retinopathy 09/25/2019 12:00 AM    Last diabetic Foot exam: No results found for: HMDIABFOOTEX   Lab Results  Component Value Date   CHOL 150 08/20/2020   HDL 66 08/20/2020   LDLCALC 70 08/20/2020   TRIG 61 08/20/2020   CHOLHDL 2.3 08/20/2020    Hepatic Function Latest Ref Rng & Units 08/20/2020 06/07/2020 12/12/2019  Total Protein 6.1 - 8.1 g/dL 6.5 7.2 7.3  Albumin 3.5 - 5.0 g/dL - 4.0 4.2  AST 10 - 35 U/L _0 ALT 6 - 29 U/L _1 Alk Phosphatase 38 - 126 U/L - 51 46  Total Bilirubin 0.2 - 1.2 mg/dL 0.5 0.7 0.7    Lab Results  Component Value Date/Time   TSH 4.23 08/20/2020 08:33 AM   TSH 3.54 08/12/2019 10:00 AM    CBC Latest Ref Rng & Units 06/07/2020 12/12/2019 06/11/2019  WBC 4.0 - 10.5 K/uL 3.5(L) 3.9(L) 4.3  Hemoglobin 12.0 - 15.0 g/dL 11.4(L) 11.7(L) 11.4(L)  Hematocrit 36.0 - 46.0 % 35.1(L) 35.9(L) 35.0(L)  Platelets 150 - 400 K/uL 195 246 219    Lab Results  Component Value Date/Time   VD25OH 41 08/12/2019 10:00 AM   VD25OH 36 12/17/2017 11:21 AM    Clinical ASCVD: No  The ASCVD Risk score Mikey Bussing DC Jr., et al., 2013) failed to calculate for the following reasons:   The 2013 ASCVD risk score is only valid for ages 33 to 57    Depression screen PHQ 2/9 08/16/2020 04/20/2020 02/10/2020  Decreased Interest 0 0 0  Down, Depressed, Hopeless 0 0 0  PHQ - 2 Score 0 0 0  Altered sleeping - - -  Tired, decreased energy - - -  Change in appetite - - -   Feeling bad or failure about yourself  - - -  Trouble concentrating - - -  Moving slowly or fidgety/restless - - -  Suicidal thoughts - - -  PHQ-9 Score - - -  Difficult doing work/chores - - -  Some recent data might be hidden    Social History   Tobacco Use  Smoking Status Never  Smokeless Tobacco Never  Tobacco Comments   smoking cessation materials not required   BP Readings from Last 3 Encounters:  08/16/20 126/62  06/07/20 (!) 149/67  04/20/20 122/74   Pulse Readings from Last 3 Encounters:  08/16/20 82  06/07/20 82  04/20/20 84   Wt Readings from Last 3 Encounters:  08/16/20 109 lb (49.4 kg)  06/07/20 113 lb 12.8 oz (51.6 kg)  04/20/20 112 lb 8 oz (51 kg)   BMI Readings from Last 3 Encounters:  08/16/20 19.94 kg/m  06/07/20 21.50 kg/m  04/20/20 21.26 kg/m    Assessment/Interventions: Review of patient past medical history, allergies, medications, health status, including review of consultants reports, laboratory and other test data, was performed as part of comprehensive evaluation and provision of chronic care management services.   SDOH:  (Social Determinants of Health) assessments and interventions performed: Yes SDOH Interventions    Flowsheet Row Most Recent Value  SDOH Interventions   Financial Strain Interventions Intervention Not Indicated      SDOH Screenings   Alcohol Screen: Low Risk    Last Alcohol Screening Score (AUDIT): 0  Depression (PHQ2-9): Low Risk    PHQ-2 Score: 0  Financial Resource Strain: Low Risk    Difficulty of Paying Living Expenses: Not hard at all  Food Insecurity: No Food Insecurity   Worried About Charity fundraiser in the Last Year: Never true   Ran Out of Food in the Last Year: Never true  Housing: Low Risk    Last Housing Risk Score: 0  Physical Activity: Sufficiently Active   Days of Exercise per Week: 7 days   Minutes of Exercise per Session: 30 min  Social Connections: Moderately Integrated   Frequency  of Communication with Friends and Family: More than three times a week   Frequency of Social Gatherings with Friends and Family: More than three times a week   Attends Religious Services: More than 4 times per year   Active Member of [employer] or Organizations: Yes   Attends Archivist Meetings: Never   Marital Status: Widowed  Stress: No Stress Concern Present   Feeling of Stress : Not at all  Tobacco Use: Low Risk    Smoking Tobacco Use: Never   Smokeless Tobacco Use: Never  Transportation Needs: No Transportation Needs   Lack of Transportation (Medical): No  Lack of Transportation (Non-Medical): No    CCM Care Plan  Allergies  Allergen Reactions   Ace Inhibitors Other (See Comments)    Pt unable to recall. Was advised to avoid    Medications Reviewed Today     Reviewed by Steele Sizer, MD (Physician) on 08/16/20 at [place]  Med List Status: <None>   Medication Order Taking? Sig Documenting Provider Last Dose Status Informant  Accu-Chek Softclix Lancets lancets [id number] Yes 100 each by Other route daily. Use as instructed Steele Sizer, MD Taking Active   Alcohol Swabs PADS [id number] Yes 1 Package by Does not apply route 3 (three) times daily as needed. Steele Sizer, MD Taking Active   amiodarone (PACERONE) 200 MG tablet [id number] Yes Take 100 mg by mouth daily. Yolonda Kida, MD Taking Active            Med Note Kai Levins Sep 22, 2015 12:11 PM) Taking half tablet  amLODipine-valsartan (EXFORGE) 5-160 MG tablet [id number] Yes Take 1 tablet by mouth daily. Steele Sizer, MD Taking Active   aspirin 81 MG tablet [id number] Yes Take 81 mg by mouth daily. [provider] Taking Active   atorvastatin (LIPITOR) 80 MG tablet [id number] Yes Take 1 tablet (80 mg total) by mouth daily. Steele Sizer, MD Taking Active   Blood Glucose Monitoring Suppl (ACCU-CHEK GUIDE ME) w/Device KIT [id number] Yes 1 each by Does not apply route daily. Steele Sizer, MD Taking Active   cholecalciferol (VITAMIN D) 1000 UNITS tablet [id number] Yes Take 1,000 Units by mouth daily.  [provider] Taking Active   Cyanocobalamin (B-12 PO) [id number] Yes Take by mouth. [provider] Taking Active   ferrous sulfate 325 (65 FE) MG tablet [id number] Yes Take 325 mg by mouth daily with breakfast. [provider] Taking Active   folic acid (FOLVITE) 675 MCG tablet [id number] Yes Take 400 mcg by mouth daily. [provider] Taking Active   glucose blood test strip [id number] Yes 1 each by Other route daily. Use as instructed Steele Sizer, MD Taking Active   hydrochlorothiazide (HYDRODIURIL) 12.5 MG tablet [id number] Yes Take 1 tablet (12.5 mg total) by mouth daily.  Patient taking differently: Take 12.5 mg by mouth 3 (three) times a week.   Steele Sizer, MD Taking Active   metFORMIN (GLUCOPHAGE-XR) 500 MG 24 hr tablet [id number] Yes Take 1 tablet (500 mg total) by mouth every evening. Steele Sizer, MD Taking Active   Multiple Vitamins-Minerals (CENTRUM SILVER 50+WOMEN PO) [id number] Yes Take 1 tablet by mouth daily. [provider] Taking Active             Patient Active Problem List   Diagnosis Date Noted   B12 deficiency 05/21/2017   Gastrointestinal tract imaging abnormality    Iron deficiency anemia    Polyp of sigmoid colon    Type 2 diabetes mellitus with stage 2 chronic kidney disease, without long-term current use of insulin ([place]) 08/03/2015   Arthritis, degenerative 05/03/2015   Menopausal and perimenopausal disorder 05/03/2015   Chronic kidney disease (CKD), stage III (moderate) (HCC) 11/30/2014   High risk medication use 11/30/2014   Diverticulosis 11/30/2014   Fibrocystic breast disease 11/30/2014   Elevated LFTs 59/45/8592   Systolic ejection murmur 92/44/6286   Paroxysmal A-fib ([place]) 11/30/2014   Microalbuminuria 07/31/2014   Elevated TSH 07/31/2014   History of cardiac pacemaker  07/31/2014   Hypercholesteremia 08/13/2013   Hypertension, benign 08/13/2013   Diabetes mellitus with renal manifestations, controlled (  [place]) 08/13/2013   Avitaminosis D 04/12/2009    Immunization History  Administered Date(s) Administered   Fluad Quad(high Dose 65+) 11/19/2018, 12/15/2019   Influenza, High Dose Seasonal PF 11/30/2014, 12/09/2015, 12/11/2016, 12/17/2017   Influenza-Unspecified 10/28/2013   PFIZER(Purple Top)SARS-COV-2 Vaccination 04/03/2019, 04/24/2019, 11/27/2019   Pneumococcal Conjugate-13 07/31/2014   Pneumococcal Polysaccharide-23 08/11/2009   Tdap 08/11/2009, 10/16/2016   Zoster, Live 12/19/2011    Conditions to be addressed/monitored:  Hypertension, Hyperlipidemia, Diabetes, Atrial Fibrillation, and Chronic Kidney Disease  Care Plan : General Pharmacy (Adult)  Updates made by Germaine Pomfret, RPH since 09/08/2020 12:00 AM     Problem: Hypertension, Hyperlipidemia, Diabetes, Atrial Fibrillation, and Chronic Kidney Disease   Priority: High     Long-Range Goal: Patient-Specific Goal   Start Date: 08/12/2020  Expected End Date: 09/08/2021  This Visit's Progress: On track  Priority: High  Note:   Current Barriers:  No barriers noted  Pharmacist Clinical Goal(s):  Patient will maintain control of blood pressure as evidenced by BP less than 140/90  maintain control of diabetes as evidenced by A1c less than 8%  through collaboration with PharmD and provider.   Interventions: 1:1 collaboration with Steele Sizer, MD regarding development and update of comprehensive plan of care as evidenced by provider attestation and co-signature Inter-disciplinary care team collaboration (see longitudinal plan of care) Comprehensive medication review performed; medication list updated in electronic medical record  Hypertension (BP goal <140/90) -Controlled -Current treatment: Amlodipine-Valsartan 5-160 mg daily  HCTZ 12.5 mg daily  -Medications previously tried:  NA  -Current home readings: systolics 195, 093, 267, 124. Today was 140/68  -Denies hypotensive/hypertensive symptoms -Counseled to monitor BP at home weekly, document, and provide log at future appointments -Recommended to continue current medication  Atrial Fibrillation (Goal: prevent stroke and major bleeding) -Controlled -CHADSVASC: 5 -Current treatment: Rate control: Amiodarone 200 mg 1/2 tablet daily  Anticoagulation: Not on anticoagulation has stayed in sinus rhythm continuously. -Medications previously tried: NA -Recommended to continue current medication  Hyperlipidemia: (LDL goal < 100) -Controlled -Current treatment: Atorvastatin 80 mg daily  -Medications previously tried: NA  -Recommended to continue current medication  Diabetes (A1c goal <8%) -Controlled -Current medications: Metformin XR 500 mg daily  -Medications previously tried: NA  -Current home glucose readings fasting glucose: 120, 125, 128, 109  -Denies hypoglycemic/hyperglycemic symptoms -Counseled to check feet daily and get yearly eye exams -Recommended to continue current medication  Patient Goals/Self-Care Activities Patient will:  - check glucose daily before breakfast, document, and provide at future appointments check blood pressure weekly, document, and provide at future appointments  Follow Up Plan: Telephone follow up appointment with care management team member scheduled for:  01/13/2021 at 1:00 PM      Medication Assistance: None required.  Patient affirms current coverage meets needs.  Compliance/Adherence/Medication fill history: Care Gaps: Covid Booster  Star-Rating Drugs: 05/04/2020 Atorvastatin 80 mg last filled for a 90-Day supply pharmacy not listed 04/08/2020 Metformin 500 mg last filled 04/08/2020 for a 90-day supply with [facility].  Patient's preferred pharmacy is:  [facility] [address]), [place] [address] [place] ([place] [address] Phone: [phone] Fax: [place] [phone] [address], [place] - [address] [place] [address] Phone: [phone] Fax: [phone]  [facility] - [address], [address] [place] [address] Phone: [phone] Fax: [phone]  Uses pill box? Yes Pt endorses 100% compliance  We discussed: Current pharmacy is preferred with insurance plan and patient is satisfied with pharmacy services Patient decided to: Continue current medication management strategy  Care Plan and Follow Up Patient Decision:  Patient agrees to Care Plan and Follow-up.  Plan: Telephone follow up appointment with care management team member scheduled for:  01/13/2021 at 1:00 PM  Doristine Section, [facility] [phone]

## 2020-08-16 ENCOUNTER — Other Ambulatory Visit: Payer: Self-pay | Admitting: Family Medicine

## 2020-08-16 ENCOUNTER — Other Ambulatory Visit: Payer: Self-pay

## 2020-08-16 ENCOUNTER — Encounter: Payer: Self-pay | Admitting: Family Medicine

## 2020-08-16 ENCOUNTER — Ambulatory Visit (INDEPENDENT_AMBULATORY_CARE_PROVIDER_SITE_OTHER): Payer: Medicare HMO | Admitting: Family Medicine

## 2020-08-16 ENCOUNTER — Telehealth: Payer: Self-pay

## 2020-08-16 VITALS — BP 126/62 | HR 82 | Temp 98.2°F | Resp 16 | Ht 62.0 in | Wt 109.0 lb

## 2020-08-16 DIAGNOSIS — N182 Chronic kidney disease, stage 2 (mild): Secondary | ICD-10-CM

## 2020-08-16 DIAGNOSIS — I10 Essential (primary) hypertension: Secondary | ICD-10-CM

## 2020-08-16 DIAGNOSIS — I480 Paroxysmal atrial fibrillation: Secondary | ICD-10-CM

## 2020-08-16 DIAGNOSIS — E538 Deficiency of other specified B group vitamins: Secondary | ICD-10-CM

## 2020-08-16 DIAGNOSIS — I495 Sick sinus syndrome: Secondary | ICD-10-CM | POA: Diagnosis not present

## 2020-08-16 DIAGNOSIS — E1122 Type 2 diabetes mellitus with diabetic chronic kidney disease: Secondary | ICD-10-CM

## 2020-08-16 DIAGNOSIS — E1169 Type 2 diabetes mellitus with other specified complication: Secondary | ICD-10-CM

## 2020-08-16 DIAGNOSIS — I129 Hypertensive chronic kidney disease with stage 1 through stage 4 chronic kidney disease, or unspecified chronic kidney disease: Secondary | ICD-10-CM

## 2020-08-16 DIAGNOSIS — E785 Hyperlipidemia, unspecified: Secondary | ICD-10-CM | POA: Diagnosis not present

## 2020-08-16 DIAGNOSIS — E559 Vitamin D deficiency, unspecified: Secondary | ICD-10-CM

## 2020-08-16 DIAGNOSIS — Z23 Encounter for immunization: Secondary | ICD-10-CM

## 2020-08-16 DIAGNOSIS — R7989 Other specified abnormal findings of blood chemistry: Secondary | ICD-10-CM

## 2020-08-16 DIAGNOSIS — N183 Chronic kidney disease, stage 3 unspecified: Secondary | ICD-10-CM

## 2020-08-16 LAB — POCT GLYCOSYLATED HEMOGLOBIN (HGB A1C): Hemoglobin A1C: 6.5 % — AB (ref 4.0–5.6)

## 2020-08-16 MED ORDER — AMLODIPINE BESYLATE-VALSARTAN 5-160 MG PO TABS: 1.0000 | ORAL_TABLET | Freq: Every day | ORAL | 1 refills | Status: DC

## 2020-08-16 MED ORDER — ZOSTER VAC RECOMB ADJUVANTED 50 MCG/0.5ML IM SUSR: 0.5000 mL | Freq: Once | INTRAMUSCULAR | 1 refills | Status: AC

## 2020-08-16 MED ORDER — AMLODIPINE BESYLATE-VALSARTAN 5-160 MG PO TABS
1.0000 | ORAL_TABLET | Freq: Every day | ORAL | 1 refills | Status: DC
Start: 2020-08-16 — End: 2020-08-16

## 2020-08-16 NOTE — Telephone Encounter (Signed)
Copied from [place] [phone]. Topic: General - Inquiry >> Aug 16, 2020  2:17 PM Greggory Keen D wrote: Reason for CRM: Pt called saying she gets hr prescriptions [facility] and not [facility].  She got a call from [facility]  to get the Amlodipine.

## 2020-08-16 NOTE — Progress Notes (Signed)
Name: Emma Campbell   MRN: [id number]    DOB: 25-Oct-1932   Date:08/16/2020       Progress Note  Subjective  Chief Complaint  Follow up  HPI  DMII with renal manifestation: CKI and microalbuminuria. Diagnosed many years ago. A1C has been well controlled, it was 6.1 %  last visit and today is 6.5 %  Glucose at home is getting checked a couple of times weekly , and well controlled 120's-130's She denies polyphagia, polydipsia or polyuria - she has nocturia once per day . She denies hypoglycemic episodes. She is down to 500 mg of Metformin and does not want to stop it, she states it keeps her sugar under control.  Continue ARB for kidney protection. Eye exam is up to date. She is on statin therapy for dyslipidemia but LDL not at goal. Recheck labs today    HTN: taking medication daily and bp is at goal, no dizziness, chest pain or palpitation. BP has been 130's/mid 60's,, seldom it goes to 150's, denies orthostatic changes   She tried to stop HCTZ but caused some swelling but controlled with taking it a few times a week. She is down on dose of Exforge and bp is stable.    Hyperlipidemia: she has been on Atorvastatin last LDL still not at goal, she is on to 80 mg daily and we will recheck level today    Sick Sinus Syndrome: she had a pacemaker placed in 2006 and has been on Amiodarone since 2006, she is now down to 100 mg of Amiodarone daily, last TSH still normal.  No chest pain or palpitation. Sees Dr. Clayborn Bigness , she is [name] score of 2, and is on aspirin only per his recommendation.  She also has a history of Afib that has been rate controlled. She also has a history of AVM's and not good candidate for blood thinners. Denies rectal bleeding. Unchanged    Vitamin D deficiency: still taking supplementation, denies fatigue, last Vitamin D was normal     Osteopenia: last FRAX 09/2019 was good risk of major fracture was 4.6 % and hip was 1.1 % , on vitamin D only.    Anemia: iron deficiency and positive  hemoccult, seen by Dr. Durwin Reges and had EGD and colonoscopy September 2018,diagnosed with AVM small bowel and had it cauterized, hemoglobin dropped and was referred to Dr. Tasia Catchings, last labs showed high B12 and no anemia, supplements were stopped but when she went back for recheck 05/2020 B12 was low again, she has been taking 1000 mcg since she received the phone call from Dr. Tasia Catchings   URI; a couple of  ago, started with a cough followed by cold symptoms, she took some cough medications and coricidin hbp, currently only has occasional cough, no sob or wheezing. Discussed importance of COVID-19 testing     Patient Active Problem List   Diagnosis Date Noted   B12 deficiency 05/21/2017   Gastrointestinal tract imaging abnormality    Iron deficiency anemia    Polyp of sigmoid colon    Type 2 diabetes mellitus with stage 2 chronic kidney disease, without long-term current use of insulin ([place]) 08/03/2015   Arthritis, degenerative 05/03/2015   Menopausal and perimenopausal disorder 05/03/2015   Chronic kidney disease (CKD), stage III (moderate) (HCC) 11/30/2014   High risk medication use 11/30/2014   Diverticulosis 11/30/2014   Fibrocystic breast disease 11/30/2014   Elevated LFTs 16/38/4536   Systolic ejection murmur 46/80/3212   Paroxysmal A-fib ([place]) 11/30/2014  Microalbuminuria 07/31/2014   Elevated TSH 07/31/2014   History of cardiac pacemaker 07/31/2014   Hypercholesteremia 08/13/2013   Hypertension, benign 08/13/2013   Diabetes mellitus with renal manifestations, controlled ([place]) 08/13/2013   Avitaminosis D 04/12/2009    Past Surgical History:  Procedure Laterality Date   CATARACT EXTRACTION W/PHACO Right 01/07/2019   Procedure: CATARACT EXTRACTION PHACO AND INTRAOCULAR LENS PLACEMENT (IOC) RIGHT DIABETIC 02:15.1          25.7%        34.69;  Surgeon: Birder Robson, MD;  Location: [place];  Service: Ophthalmology;  Laterality: Right;  Diabetic - oral meds   CATARACT EXTRACTION  W/PHACO Left 01/28/2019   Procedure: CATARACT EXTRACTION PHACO AND INTRAOCULAR LENS PLACEMENT (IOC) LEFT DIABETIC 12.96  01:15.5;  Surgeon: Birder Robson, MD;  Location: [place];  Service: Ophthalmology;  Laterality: Left;  Diabetic - oral meds   COLONOSCOPY WITH PROPOFOL N/A 11/06/2016   Procedure: COLONOSCOPY WITH PROPOFOL;  Surgeon: Lucilla Lame, MD;  Location: [place];  Service: Endoscopy;  Laterality: N/A;  DIABETIC   ESOPHAGOGASTRODUODENOSCOPY (EGD) WITH PROPOFOL N/A 11/06/2016   Procedure: ESOPHAGOGASTRODUODENOSCOPY (EGD) WITH PROPOFOL;  Surgeon: Lucilla Lame, MD;  Location: [place];  Service: Endoscopy;  Laterality: N/A;   ESOPHAGOGASTRODUODENOSCOPY (EGD) WITH PROPOFOL N/A 01/23/2017   Procedure: ESOPHAGOGASTRODUODENOSCOPY (EGD) WITH PROPOFOL with PUSH;  Surgeon: Lucilla Lame, MD;  Location: [facility];  Service: Endoscopy;  Laterality: N/A;   GIVENS CAPSULE STUDY N/A 12/22/2016   Procedure: GIVENS CAPSULE STUDY;  Surgeon: Lucilla Lame, MD;  Location: [facility];  Service: Endoscopy;  Laterality: N/A;   INSERT / REPLACE / REMOVE PACEMAKER     PACEMAKER INSERTION  2006   POLYPECTOMY  11/06/2016   Procedure: POLYPECTOMY;  Surgeon: Lucilla Lame, MD;  Location: [place];  Service: Endoscopy;;    Family History  Problem Relation Age of Onset   Hypertension Mother    Stroke Mother    Diabetes Daughter    Stroke Father    Healthy Sister    Hypertension Maternal Grandmother     Social History   Tobacco Use   Smoking status: Never   Smokeless tobacco: Never   Tobacco comments:    smoking cessation materials not required  Substance Use Topics   Alcohol use: No    Alcohol/week: 0.0 standard drinks     Current Outpatient Medications:    Accu-Chek Softclix Lancets lancets, 100 each by Other route daily. Use as instructed, Disp: 100 each, Rfl: 2   Alcohol Swabs PADS, 1 Package by Does not apply route 3 (three) times daily as  needed., Disp: 1 each, Rfl: 3   amiodarone (PACERONE) 200 MG tablet, Take 100 mg by mouth daily., Disp: , Rfl:    aspirin 81 MG tablet, Take 81 mg by mouth daily., Disp: , Rfl:    atorvastatin (LIPITOR) 80 MG tablet, Take 1 tablet (80 mg total) by mouth daily., Disp: 90 tablet, Rfl: 1   Blood Glucose Monitoring Suppl (ACCU-CHEK GUIDE ME) w/Device KIT, 1 each by Does not apply route daily., Disp: 1 kit, Rfl: 0   cholecalciferol (VITAMIN D) 1000 UNITS tablet, Take 1,000 Units by mouth daily. , Disp: , Rfl:    Cyanocobalamin (B-12 PO), Take by mouth., Disp: , Rfl:    ferrous sulfate 325 (65 FE) MG tablet, Take 325 mg by mouth daily with breakfast., Disp: , Rfl:    folic acid (FOLVITE) 952 MCG tablet, Take 400 mcg by mouth daily., Disp: ,  Rfl:    glucose blood test strip, 1 each by Other route daily. Use as instructed, Disp: 100 each, Rfl: 2   hydrochlorothiazide (HYDRODIURIL) 12.5 MG tablet, Take 1 tablet (12.5 mg total) by mouth daily. (Patient taking differently: Take 12.5 mg by mouth 3 (three) times a week.), Disp: 90 tablet, Rfl: 1   metFORMIN (GLUCOPHAGE-XR) 500 MG 24 hr tablet, Take 1 tablet (500 mg total) by mouth every evening., Disp: 90 tablet, Rfl: 1   Multiple Vitamins-Minerals (CENTRUM SILVER 50+WOMEN PO), Take 1 tablet by mouth daily., Disp: , Rfl:    Zoster Vaccine Adjuvanted (SHINGRIX) injection, Inject 0.5 mLs into the muscle once for 1 dose., Disp: 0.5 mL, Rfl: 1   amLODipine-valsartan (EXFORGE) 5-160 MG tablet, Take 1 tablet by mouth daily., Disp: 90 tablet, Rfl: 1  Allergies  Allergen Reactions   Ace Inhibitors Other (See Comments)    Pt unable to recall. Was advised to avoid    I personally reviewed active problem list, medication list, allergies, family history, social history, health maintenance, notes from last encounter with the patient/caregiver today.   ROS  Constitutional: Negative for fever or weight change.  Respiratory: positive for occasional cough ( she had  cold symptoms a couple of weeks ago) but no  shortness of breath.   Cardiovascular: Negative for chest pain or palpitations.  Gastrointestinal: Negative for abdominal pain, no bowel changes.  Musculoskeletal: Negative for gait problem or joint swelling.  Skin: Negative for rash.  Neurological: Negative for dizziness or headache.  No other specific complaints in a complete review of systems (except as listed in HPI above).   Objective  Vitals:   08/16/20 1302 08/16/20 1334  BP: 112/68 126/62  Pulse: 82   Resp: 16   Temp: 98.2 F (36.8 C)   TempSrc: Oral   SpO2: 98%   Weight: 109 lb (49.4 kg)   Height: 5' 2"  (1.575 m)     Body mass index is 19.94 kg/m.  Physical Exam  Constitutional: Patient appears well-developed and well-nourished.   No distress.  HEENT: head atraumatic, normocephalic, pupils equal and reactive to light, neck supple Cardiovascular: Normal rate, regular rhythm and normal heart sounds.  3/6 SEM murmur heard. No BLE edema. Pulmonary/Chest: Effort normal and breath sounds normal. No respiratory distress. Abdominal: Soft.  There is no tenderness. Psychiatric: Patient has a normal mood and affect. behavior is normal. Judgment and thought content normal.   Recent Results (from the past 2160 hour(s))  Vitamin B12     Status: None   Collection Time: 06/07/20  9:54 AM  Result Value Ref Range   Vitamin B-12 250 180 - 914 pg/mL    Comment: (NOTE) This assay is not validated for testing neonatal or myeloproliferative syndrome specimens for Vitamin B12 levels. Performed at [facility], [place] [address] [place], [place] [address]   Iron and TIBC     Status: None   Collection Time: 06/07/20  9:54 AM  Result Value Ref Range   Iron 74 28 - 170 ug/dL   TIBC 297 250 - 450 ug/dL   Saturation Ratios 25 10.4 - 31.8 %   UIBC 223 ug/dL    Comment: Performed at [facility], [address] [place], [place] [address]  Ferritin     Status: None    Collection Time: 06/07/20  9:54 AM  Result Value Ref Range   Ferritin 114 11 - 307 ng/mL    Comment: Performed at [facility], [address] [place], [place] [address]  Comprehensive metabolic panel     Status: Abnormal   Collection Time: 06/07/20  9:54 AM  Result Value Ref Range   Sodium 140 135 - 145 mmol/L   Potassium 3.8 3.5 - 5.1 mmol/L   Chloride 105 98 - 111 mmol/L   CO2 25 22 - 32 mmol/L   Glucose, Bld 125 (H) 70 - 99 mg/dL    Comment: Glucose reference range applies only to samples taken after fasting for at least 8 hours.   BUN 17 8 - 23 mg/dL   Creatinine, Ser 0.93 0.44 - 1.00 mg/dL   Calcium 8.8 (L) 8.9 - 10.3 mg/dL   Total Protein 7.2 6.5 - 8.1 g/dL   Albumin 4.0 3.5 - 5.0 g/dL   AST 24 15 - 41 U/L   ALT 25 0 - 44 U/L   Alkaline Phosphatase 51 38 - 126 U/L   Total Bilirubin 0.7 0.3 - 1.2 mg/dL   GFR, Estimated 60 (L) >60 mL/min    Comment: (NOTE) Calculated using the CKD-EPI Creatinine Equation (2021)    Anion gap 10 5 - 15    Comment: Performed at [facility], [address] [place], [place] [address]  CBC with Differential/Platelet     Status: Abnormal   Collection Time: 06/07/20  9:54 AM  Result Value Ref Range   WBC 3.5 (L) 4.0 - 10.5 K/uL   RBC 3.81 (L) 3.87 - 5.11 MIL/uL   Hemoglobin 11.4 (L) 12.0 - 15.0 g/dL   HCT 35.1 (L) 36.0 - 46.0 %   MCV 92.1 80.0 - 100.0 fL   MCH 29.9 26.0 - 34.0 pg   MCHC 32.5 30.0 - 36.0 g/dL   RDW 12.9 11.5 - 15.5 %   Platelets 195 150 - 400 K/uL   nRBC 0.0 0.0 - 0.2 %   Neutrophils Relative % 45 %   Neutro Abs 1.6 (L) 1.7 - 7.7 K/uL   Lymphocytes Relative 43 %   Lymphs Abs 1.5 0.7 - 4.0 K/uL   Monocytes Relative 7 %   Monocytes Absolute 0.3 0.1 - 1.0 K/uL   Eosinophils Relative 4 %   Eosinophils Absolute 0.1 0.0 - 0.5 K/uL   Basophils Relative 1 %   Basophils Absolute 0.0 0.0 - 0.1 K/uL   Immature Granulocytes 0 %   Abs Immature Granulocytes 0.00 0.00 - 0.07 K/uL    Comment: Performed at [facility], [address] [place], [place] [address]  POCT HgB A1C     Status: Abnormal   Collection Time: 08/16/20  1:08 PM  Result Value Ref Range   Hemoglobin A1C 6.5 (A) 4.0 - 5.6 %   HbA1c POC (<> result, manual entry)     HbA1c, POC (prediabetic range)     HbA1c, POC (controlled diabetic range)       PHQ2/9: Depression screen [facility] 2/9 08/16/2020 04/20/2020 02/10/2020 12/15/2019 09/11/2019  Decreased Interest 0 0 0 0 0  Down, Depressed, Hopeless 0 0 0 0 0  PHQ - 2 Score 0 0 0 0 0  Altered sleeping - - - - -  Tired, decreased energy - - - - -  Change in appetite - - - - -  Feeling bad or failure about yourself  - - - - -  Trouble concentrating - - - - -  Moving slowly or fidgety/restless - - - - -  Suicidal thoughts - - - - -  PHQ-9 Score - - - - -  Difficult doing work/chores - - - - -  Some recent data might be hidden    phq 9 is negative   Fall Risk: Fall Risk  08/16/2020 04/20/2020 02/10/2020 12/15/2019 09/11/2019  Falls in the past year? 0 0 0 0 0  Number falls in past yr: 0 0 0 0 0  Injury with Fall? 0 0 0 0 0  Comment - - - - -  Risk for fall due to : - - - - No Fall Risks  Risk for fall due to: Comment - - - - -  Follow up - - - - Falls prevention discussed     Functional Status Survey: Is the patient deaf or have difficulty hearing?: No Does the patient have difficulty seeing, even when wearing glasses/contacts?: No Does the patient have difficulty concentrating, remembering, or making decisions?: No Does the patient have difficulty walking or climbing stairs?: No Does the patient have difficulty dressing or bathing?: No Does the patient have difficulty doing errands alone such as visiting a doctor's office or shopping?: No   1. Type 2 diabetes mellitus with stage 2 chronic kidney disease, without long-term current use of insulin (HCC)  - POCT HgB A1C - Microalbumin / creatinine urine ratio  2. B12 deficiency  Taking supplements  3.  Dyslipidemia  - Lipid panel  4. Hypertension, benign  - COMPLETE METABOLIC PANEL WITH GFR  5. Paroxysmal A-fib (HCC)  Rate controlled at this time  6. Sick sinus syndrome ([place])  Has pacemaker  7. Vitamin D deficiency   8. Hypertension associated with stage 3 chronic kidney disease due to type 2 diabetes mellitus (HCC)  - COMPLETE METABOLIC PANEL WITH GFR  9. Dyslipidemia associated with type 2 diabetes mellitus (HCC)  - Lpid panel  10. Elevated TSH  - TSH

## 2020-08-18 ENCOUNTER — Ambulatory Visit: Payer: Medicare HMO | Admitting: Family Medicine

## 2020-08-20 DIAGNOSIS — E1122 Type 2 diabetes mellitus with diabetic chronic kidney disease: Secondary | ICD-10-CM | POA: Diagnosis not present

## 2020-08-20 DIAGNOSIS — R7989 Other specified abnormal findings of blood chemistry: Secondary | ICD-10-CM | POA: Diagnosis not present

## 2020-08-20 DIAGNOSIS — I10 Essential (primary) hypertension: Secondary | ICD-10-CM | POA: Diagnosis not present

## 2020-08-20 DIAGNOSIS — N183 Chronic kidney disease, stage 3 unspecified: Secondary | ICD-10-CM | POA: Diagnosis not present

## 2020-08-20 DIAGNOSIS — I129 Hypertensive chronic kidney disease with stage 1 through stage 4 chronic kidney disease, or unspecified chronic kidney disease: Secondary | ICD-10-CM | POA: Diagnosis not present

## 2020-08-20 DIAGNOSIS — E1169 Type 2 diabetes mellitus with other specified complication: Secondary | ICD-10-CM | POA: Diagnosis not present

## 2020-08-20 DIAGNOSIS — E785 Hyperlipidemia, unspecified: Secondary | ICD-10-CM | POA: Diagnosis not present

## 2020-08-20 DIAGNOSIS — N182 Chronic kidney disease, stage 2 (mild): Secondary | ICD-10-CM | POA: Diagnosis not present

## 2020-08-21 LAB — LIPID PANEL
Cholesterol: 150 mg/dL (ref <200)
HDL: 66 mg/dL (ref >=50)
LDL Cholesterol (Calc): 70 mg/dL (calc)
Non-HDL Cholesterol (Calc): 84 mg/dL (calc) (ref <130)
Total CHOL/HDL Ratio: 2.3 (calc) (ref <5.0)
Triglycerides: 61 mg/dL (ref <150)

## 2020-08-21 LAB — COMPLETE METABOLIC PANEL WITH GFR
AG Ratio: 1.3 (calc) (ref 1.0–2.5)
ALT: 20 U/L (ref 6–29)
AST: 20 U/L (ref 10–35)
Albumin: 3.7 g/dL (ref 3.6–5.1)
Alkaline phosphatase (APISO): 66 U/L (ref 37–153)
BUN: 14 mg/dL (ref 7–25)
CO2: 28 mmol/L (ref 20–32)
Calcium: 9.3 mg/dL (ref 8.6–10.4)
Chloride: 102 mmol/L (ref 98–110)
Creat: 0.68 mg/dL (ref 0.60–0.88)
GFR, Est African American: 92 mL/min/{1.73_m2} (ref >=60)
GFR, Est Non African American: 79 mL/min/{1.73_m2} (ref >=60)
Globulin: 2.8 g/dL (calc) (ref 1.9–3.7)
Glucose, Bld: 108 mg/dL — ABNORMAL HIGH (ref 65–99)
Potassium: 4.3 mmol/L (ref 3.5–5.3)
Sodium: 139 mmol/L (ref 135–146)
Total Bilirubin: 0.5 mg/dL (ref 0.2–1.2)
Total Protein: 6.5 g/dL (ref 6.1–8.1)

## 2020-08-21 LAB — MICROALBUMIN / CREATININE URINE RATIO
Creatinine, Urine: 135 mg/dL (ref 20–275)
Microalb Creat Ratio: 11 mcg/mg creat (ref <30)
Microalb, Ur: 1.5 mg/dL

## 2020-08-21 LAB — TSH: TSH: 4.23 mIU/L (ref 0.40–4.50)

## 2020-08-27 ENCOUNTER — Other Ambulatory Visit: Payer: Self-pay | Admitting: Family Medicine

## 2020-09-08 NOTE — Patient Instructions (Signed)
Visit Information It was great speaking with you today!  Please let me know if you have any questions about our visit.   Goals Addressed             This Visit's Progress    Monitor and Manage My Blood Sugar-Diabetes Type 2       Timeframe:  Long-Range Goal Priority:  High Start Date: 08/12/2020                              Expected End Date:  08/12/2021                      Follow Up Date 10/26/2020    - check blood sugar at prescribed times - check blood sugar if I feel it is too high or too low - enter blood sugar readings and medication or insulin into daily log    Why is this important?   Checking your blood sugar at home helps to keep it from getting very high or very low.  Writing the results in a diary or log helps the doctor know how to care for you.  Your blood sugar log should have the time, date and the results.  Also, write down the amount of insulin or other medicine that you take.  Other information, like what you ate, exercise done and how you were feeling, will also be helpful.     Notes:          Patient Care Plan: General Pharmacy (Adult)     Problem Identified: Hypertension, Hyperlipidemia, Diabetes, Atrial Fibrillation, and Chronic Kidney Disease   Priority: High     Long-Range Goal: Patient-Specific Goal   Start Date: 08/12/2020  Expected End Date: 09/08/2021  This Visit's Progress: On track  Priority: High  Note:   Current Barriers:  No barriers noted  Pharmacist Clinical Goal(s):  Patient will maintain control of blood pressure as evidenced by BP less than 140/90  maintain control of diabetes as evidenced by A1c less than 8%  through collaboration with PharmD and provider.   Interventions: 1:1 collaboration with Steele Sizer, MD regarding development and update of comprehensive plan of care as evidenced by provider attestation and co-signature Inter-disciplinary care team collaboration (see longitudinal plan of care) Comprehensive  medication review performed; medication list updated in electronic medical record  Hypertension (BP goal <140/90) -Controlled -Current treatment: Amlodipine-Valsartan 5-160 mg daily  HCTZ 12.5 mg daily  -Medications previously tried: NA  -Current home readings: systolics 601, 093, 235, 573. Today was 140/68  -Denies hypotensive/hypertensive symptoms -Counseled to monitor BP at home weekly, document, and provide log at future appointments -Recommended to continue current medication  Atrial Fibrillation (Goal: prevent stroke and major bleeding) -Controlled -CHADSVASC: 5 -Current treatment: Rate control: Amiodarone 200 mg 1/2 tablet daily  Anticoagulation: Not on anticoagulation has stayed in sinus rhythm continuously. -Medications previously tried: NA -Recommended to continue current medication  Hyperlipidemia: (LDL goal < 100) -Controlled -Current treatment: Atorvastatin 80 mg daily  -Medications previously tried: NA  -Recommended to continue current medication  Diabetes (A1c goal <8%) -Controlled -Current medications: Metformin XR 500 mg daily  -Medications previously tried: NA  -Current home glucose readings fasting glucose: 120, 125, 128, 109  -Denies hypoglycemic/hyperglycemic symptoms -Counseled to check feet daily and get yearly eye exams -Recommended to continue current medication  Patient Goals/Self-Care Activities Patient will:  - check glucose daily before breakfast, document, and provide at  future appointments check blood pressure weekly, document, and provide at future appointments  Follow Up Plan: Telephone follow up appointment with care management team member scheduled for:  01/13/2021 at 1:00 PM      Patient agreed to services and verbal consent obtained.   The patient verbalized understanding of instructions, educational materials, and care plan provided today and declined offer to receive copy of patient instructions, educational materials, and  care plan.   Doristine Section, [facility] [phone]

## 2020-09-09 ENCOUNTER — Telehealth: Payer: Self-pay

## 2020-09-09 NOTE — Progress Notes (Signed)
Chronic Care Management Pharmacy Assistant   Name: ROBENA EWY  MRN: [id number] DOB: 09-26-1932  Reason for Encounter: Hypertension Disease State Call    Recent office visits:  08/16/2020 Steele Sizer, MD (PCP Office Visit) for Follow-Up- No medication changes noted, labs ordered  Recent consult visits:  No recent consult visits  Hospital visits:  None in previous 6 months  Medications: Outpatient Encounter Medications as of 09/09/2020  Medication Sig Note   Accu-Chek Softclix Lancets lancets 100 each by Other route daily. Use as instructed    Alcohol Swabs (B-D SINGLE USE SWABS REGULAR) PADS USE AS DIRECTED THREE TIMES DAILY AS NEEDED    amiodarone (PACERONE) 200 MG tablet Take 100 mg by mouth daily. 09/22/2015: Taking half tablet   amLODipine-valsartan (EXFORGE) 5-160 MG tablet Take 1 tablet by mouth daily.    aspirin 81 MG tablet Take 81 mg by mouth daily.    atorvastatin (LIPITOR) 80 MG tablet Take 1 tablet (80 mg total) by mouth daily.    Blood Glucose Monitoring Suppl (ACCU-CHEK GUIDE ME) w/Device KIT 1 each by Does not apply route daily.    cholecalciferol (VITAMIN D) 1000 UNITS tablet Take 1,000 Units by mouth daily.     Cyanocobalamin (B-12 PO) Take by mouth.    ferrous sulfate 325 (65 FE) MG tablet Take 325 mg by mouth daily with breakfast.    folic acid (FOLVITE) 557 MCG tablet Take 400 mcg by mouth daily.    glucose blood test strip 1 each by Other route daily. Use as instructed    hydrochlorothiazide (HYDRODIURIL) 12.5 MG tablet Take 1 tablet (12.5 mg total) by mouth daily. (Patient taking differently: Take 12.5 mg by mouth 3 (three) times a week.)    metFORMIN (GLUCOPHAGE-XR) 500 MG 24 hr tablet Take 1 tablet (500 mg total) by mouth every evening.    Multiple Vitamins-Minerals (CENTRUM SILVER 50+WOMEN PO) Take 1 tablet by mouth daily.    No facility-administered encounter medications on file as of 09/09/2020.    Care Gaps: COVID-19 Vaccine Dose 4  Star  Rating Drugs: Amlodipine-Valsartan 5-160 mg last filled on 06/18/2020 for a 90-Day supply at [facility] Atorvastatin 80 mg last filled on 05/04/2020 for a 90-Day supply Pharmacy not listed Metformin 500 mg last filled on 06/15/2020 for a 90-Day supply no Pharmacy listed  Reviewed chart prior to disease state call. Spoke with patient regarding BP  Recent Office Vitals: BP Readings from Last 3 Encounters:  08/16/20 126/62  06/07/20 (!) 149/67  04/20/20 122/74   Pulse Readings from Last 3 Encounters:  08/16/20 82  06/07/20 82  04/20/20 84    Wt Readings from Last 3 Encounters:  08/16/20 109 lb (49.4 kg)  06/07/20 113 lb 12.8 oz (51.6 kg)  04/20/20 112 lb 8 oz (51 kg)     Kidney Function Lab Results  Component Value Date/Time   CREATININE 0.68 08/20/2020 08:33 AM   CREATININE 0.93 06/07/2020 09:54 AM   CREATININE 0.94 12/12/2019 09:27 AM   CREATININE 0.94 (H) 08/12/2019 10:00 AM   GFRNONAA 79 08/20/2020 08:33 AM   GFRAA 92 08/20/2020 08:33 AM    BMP Latest Ref Rng & Units 08/20/2020 06/07/2020 12/12/2019  Glucose 65 - 99 mg/dL 108(H) 125(H) 126(H)  BUN 7 - 25 mg/dL 14 17 15   Creatinine 0.60 - 0.88 mg/dL 0.68 0.93 0.94  BUN/Creat Ratio 6 - 22 (calc) NOT APPLICABLE - -  Sodium 322 - 146 mmol/L 139 140 137  Potassium 3.5 - 5.3  mmol/L 4.3 3.8 4.0  Chloride 98 - 110 mmol/L 102 105 100  CO2 20 - 32 mmol/L 28 25 26   Calcium 8.6 - 10.4 mg/dL 9.3 8.8(L) 9.0    Current antihypertensive regimen:  Amlodipine-Valsartan 5-160 mg 1 tablet daily Hydrochlorothiazide 12.5 mg 1 tablet 3 times weekly  How often are you checking your Blood Pressure? weekly  Current home BP readings: 07/18 129/63 07/14 145/71  What recent interventions/DTPs have been made by any provider to improve Blood Pressure control since last CPP Visit: Patient reports provider cut her HCTZ down to 3 times weekly to reduce swelling and so far she has been doing well with the 3 times weekly  Any recent  hospitalizations or ED visits since last visit with CPP? No  What diet changes have been made to improve Blood Pressure Control?  Patient doesn't report having a certain diet. She stated yesterday she went to the [employer] and was able to get some [employer], Squash, Tomatoes, and a Kreamer. She stated that she does eat meat but she doesn't fry anything for the most part she bakes or uses her crockpot.   What exercise is being done to improve your Blood Pressure Control?  Patient reports that she walks a mile at the park ever day,  Adherence Review: Is the patient currently on ACE/ARB medication? Yes Does the patient have >5 day gap between last estimated fill dates? No  Patient has follow-up with [name], CPP on 11/17 @ 1300  Patient also reported that last Friday she received her 2nd booster. Only side effect was a sore stiff arm that has resolved at this time. Patient denies needing any refills at this time and was provided with my phone number for future communication  07/15- spoke with the patient but she was not home and she requested me to give her a call back AWV scheduled 07/26 @ 1120  07/18- LVM requesting patient to return my call  [name], [place] Pharmacist Assistant Phone: [phone]

## 2020-09-16 ENCOUNTER — Other Ambulatory Visit: Payer: Self-pay

## 2020-09-17 ENCOUNTER — Other Ambulatory Visit: Payer: Self-pay

## 2020-09-17 DIAGNOSIS — E1169 Type 2 diabetes mellitus with other specified complication: Secondary | ICD-10-CM

## 2020-09-17 DIAGNOSIS — E785 Hyperlipidemia, unspecified: Secondary | ICD-10-CM

## 2020-09-17 DIAGNOSIS — E1122 Type 2 diabetes mellitus with diabetic chronic kidney disease: Secondary | ICD-10-CM

## 2020-09-17 DIAGNOSIS — N183 Hypertensive chronic kidney disease with stage 1 through stage 4 chronic kidney disease, or unspecified chronic kidney disease: Secondary | ICD-10-CM

## 2020-09-17 DIAGNOSIS — I10 Essential (primary) hypertension: Secondary | ICD-10-CM

## 2020-09-17 MED ORDER — AMLODIPINE BESYLATE-VALSARTAN 5-160 MG PO TABS: 1.0000 | ORAL_TABLET | Freq: Every day | ORAL | 1 refills | Status: DC

## 2020-09-17 MED ORDER — ATORVASTATIN CALCIUM 80 MG PO TABS: 80.0000 mg | ORAL_TABLET | Freq: Every day | ORAL | 1 refills | Status: DC

## 2020-09-17 MED ORDER — HYDROCHLOROTHIAZIDE 12.5 MG PO TABS: 12.5000 mg | ORAL_TABLET | Freq: Every day | ORAL | 1 refills | Status: DC

## 2020-09-21 ENCOUNTER — Ambulatory Visit (INDEPENDENT_AMBULATORY_CARE_PROVIDER_SITE_OTHER): Payer: Medicare HMO

## 2020-09-21 DIAGNOSIS — Z0000 Encounter for general adult medical examination without abnormal findings: Secondary | ICD-10-CM

## 2020-09-21 NOTE — Patient Instructions (Signed)
Emma Campbell , Thank you for taking time to come for your Medicare Wellness Visit. I appreciate your ongoing commitment to your health goals. Please review the following plan we discussed and let me know if I can assist you in the future.   Screening recommendations/referrals: Colonoscopy: no longer required Mammogram: no longer required Bone Density: no longer required Recommended yearly ophthalmology/optometry visit for glaucoma screening and checkup Recommended yearly dental visit for hygiene and checkup  Vaccinations: Influenza vaccine: done 12/15/19  Pneumococcal vaccine: done 07/31/14 Tdap vaccine: done 10/16/16 Shingles vaccine: 1st dose done 08/18/20; due for second dose 10/18/20   Covid-19:done 04/03/19, 04/24/19, 11/27/19 & 09/17/20  Advanced directives: Advance directive discussed with you today. I have provided a copy for you to complete at home and have notarized. Once this is complete please bring a copy in to our office so we can scan it into your chart.   Conditions/risks identified: Keep up the great work!  Next appointment: Follow up in one year for your annual wellness visit    Preventive Care 65 Years and Older, Female Preventive care refers to lifestyle choices and visits with your health care provider that can promote health and wellness. What does preventive care include? A yearly physical exam. This is also called an annual well check. Dental exams once or twice a year. Routine eye exams. Ask your health care provider how often you should have your eyes checked. Personal lifestyle choices, including: Daily care of your teeth and gums. Regular physical activity. Eating a healthy diet. Avoiding tobacco and drug use. Limiting alcohol use. Practicing safe sex. Taking low-dose aspirin every day. Taking vitamin and mineral supplements as recommended by your health care provider. What happens during an annual well check? The services and screenings done by your health care  provider during your annual well check will depend on your age, overall health, lifestyle risk factors, and family history of disease. Counseling  Your health care provider may ask you questions about your: Alcohol use. Tobacco use. Drug use. Emotional well-being. Home and relationship well-being. Sexual activity. Eating habits. History of falls. Memory and ability to understand (cognition). Work and work Statistician. Reproductive health. Screening  You may have the following tests or measurements: Height, weight, and BMI. Blood pressure. Lipid and cholesterol levels. These may be checked every 5 years, or more frequently if you are over 32 years old. Skin check. Lung cancer screening. You may have this screening every year starting at age 16 if you have a 30-pack-year history of smoking and currently smoke or have quit within the past 15 years. Fecal occult blood test (FOBT) of the stool. You may have this test every year starting at age 51. Flexible sigmoidoscopy or colonoscopy. You may have a sigmoidoscopy every 5 years or a colonoscopy every 10 years starting at age 61. Hepatitis C blood test. Hepatitis B blood test. Sexually transmitted disease (STD) testing. Diabetes screening. This is done by checking your blood sugar (glucose) after you have not eaten for a while (fasting). You may have this done every 1-3 years. Bone density scan. This is done to screen for osteoporosis. You may have this done starting at age 47. Mammogram. This may be done every 1-2 years. Talk to your health care provider about how often you should have regular mammograms. Talk with your health care provider about your test results, treatment options, and if necessary, the need for more tests. Vaccines  Your health care provider may recommend certain vaccines, such as: Influenza  vaccine. This is recommended every year. Tetanus, diphtheria, and acellular pertussis (Tdap, Td) vaccine. You may need a Td  booster every 10 years. Zoster vaccine. You may need this after age 55. Pneumococcal 13-valent conjugate (PCV13) vaccine. One dose is recommended after age 49. Pneumococcal polysaccharide (PPSV23) vaccine. One dose is recommended after age 75. Talk to your health care provider about which screenings and vaccines you need and how often you need them. This information is not intended to replace advice given to you by your health care provider. Make sure you discuss any questions you have with your health care provider. Document Released: 03/12/2015 Document Revised: 11/03/2015 Document Reviewed: 12/15/2014 Elsevier Interactive Patient Education  2017 [place] Prevention in the Home Falls can cause injuries. They can happen to people of all ages. There are many things you can do to make your home safe and to help prevent falls. What can I do on the outside of my home? Regularly fix the edges of walkways and driveways and fix any cracks. Remove anything that might make you trip as you walk through a door, such as a raised step or threshold. Trim any bushes or trees on the path to your home. Use bright outdoor lighting. Clear any walking paths of anything that might make someone trip, such as rocks or tools. Regularly check to see if handrails are loose or broken. Make sure that both sides of any steps have handrails. Any raised decks and porches should have guardrails on the edges. Have any leaves, snow, or ice cleared regularly. Use sand or salt on walking paths during winter. Clean up any spills in your garage right away. This includes oil or grease spills. What can I do in the bathroom? Use night lights. Install grab bars by the toilet and in the tub and shower. Do not use towel bars as grab bars. Use non-skid mats or decals in the tub or shower. If you need to sit down in the shower, use a plastic, non-slip stool. Keep the floor dry. Clean up any water that spills on the floor  as soon as it happens. Remove soap buildup in the tub or shower regularly. Attach bath mats securely with double-sided non-slip rug tape. Do not have throw rugs and other things on the floor that can make you trip. What can I do in the bedroom? Use night lights. Make sure that you have a light by your bed that is easy to reach. Do not use any sheets or blankets that are too big for your bed. They should not hang down onto the floor. Have a firm chair that has side arms. You can use this for support while you get dressed. Do not have throw rugs and other things on the floor that can make you trip. What can I do in the kitchen? Clean up any spills right away. Avoid walking on wet floors. Keep items that you use a lot in easy-to-reach places. If you need to reach something above you, use a strong step stool that has a grab bar. Keep electrical cords out of the way. Do not use floor polish or wax that makes floors slippery. If you must use wax, use non-skid floor wax. Do not have throw rugs and other things on the floor that can make you trip. What can I do with my stairs? Do not leave any items on the stairs. Make sure that there are handrails on both sides of the stairs and use them. Fix  handrails that are broken or loose. Make sure that handrails are as long as the stairways. Check any carpeting to make sure that it is firmly attached to the stairs. Fix any carpet that is loose or worn. Avoid having throw rugs at the top or bottom of the stairs. If you do have throw rugs, attach them to the floor with carpet tape. Make sure that you have a light switch at the top of the stairs and the bottom of the stairs. If you do not have them, ask someone to add them for you. What else can I do to help prevent falls? Wear shoes that: Do not have high heels. Have rubber bottoms. Are comfortable and fit you well. Are closed at the toe. Do not wear sandals. If you use a stepladder: Make sure that it is  fully opened. Do not climb a closed stepladder. Make sure that both sides of the stepladder are locked into place. Ask someone to hold it for you, if possible. Clearly mark and make sure that you can see: Any grab bars or handrails. First and last steps. Where the edge of each step is. Use tools that help you move around (mobility aids) if they are needed. These include: Canes. Walkers. Scooters. Crutches. Turn on the lights when you go into a dark area. Replace any light bulbs as soon as they burn out. Set up your furniture so you have a clear path. Avoid moving your furniture around. If any of your floors are uneven, fix them. If there are any pets around you, be aware of where they are. Review your medicines with your doctor. Some medicines can make you feel dizzy. This can increase your chance of falling. Ask your doctor what other things that you can do to help prevent falls. This information is not intended to replace advice given to you by your health care provider. Make sure you discuss any questions you have with your health care provider. Document Released: 12/10/2008 Document Revised: 07/22/2015 Document Reviewed: 03/20/2014 Elsevier Interactive Patient Education  2017 [employer].

## 2020-09-21 NOTE — Progress Notes (Signed)
Subjective:   Emma Campbell is a 87 y.o. female who presents for Medicare Annual (Subsequent) preventive examination.  Virtual Visit via Telephone Note  I connected with  HARJOT ZAVADIL on 09/21/20 at 11:20 AM EDT by telephone and verified that I am speaking with the correct person using two identifiers.  Location: Patient: home Provider: [place] Persons participating in the virtual visit: [place]   I discussed the limitations, risks, security and privacy concerns of performing an evaluation and management service by telephone and the availability of in person appointments. The patient expressed understanding and agreed to proceed.  Interactive audio and video telecommunications were attempted between this nurse and patient, however failed, due to patient having technical difficulties OR patient did not have access to video capability.  We continued and completed visit with audio only.  Some vital signs may be absent or patient reported.   Clemetine Marker, LPN   Review of Systems     Cardiac Risk Factors include: advanced age (>59mn, >>62women);diabetes mellitus;dyslipidemia;hypertension     Objective:    There were no vitals filed for this visit. There is no height or weight on file to calculate BMI.  Advanced Directives 09/21/2020 06/07/2020 12/12/2019 09/11/2019 06/13/2019 01/28/2019 01/07/2019  Does Patient Have a Medical Advance Directive? No Yes No Yes No Yes No  Type of Advance Directive - - -Public librarianLiving will - HIndustryLiving will -  Does patient want to make changes to medical advance directive? - Yes (MAU/Ambulatory/Procedural Areas - Information given) - - - No - Patient declined -  Copy of HCairoin Chart? - - - No - copy requested - No - copy requested -  Would patient like information on creating a medical advance directive? Yes (MAU/Ambulatory/Procedural Areas - Information given) Yes  (ED - Information included in AVS) No - Patient declined - - - Yes (MAU/Ambulatory/Procedural Areas - Information given)    Current Medications (verified) Outpatient Encounter Medications as of 09/21/2020  Medication Sig   Accu-Chek Softclix Lancets lancets 100 each by Other route daily. Use as instructed   Alcohol Swabs (B-D SINGLE USE SWABS REGULAR) PADS USE AS DIRECTED THREE TIMES DAILY AS NEEDED   amiodarone (PACERONE) 200 MG tablet Take 100 mg by mouth daily.   amLODipine-valsartan (EXFORGE) 5-160 MG tablet Take 1 tablet by mouth daily.   aspirin 81 MG tablet Take 81 mg by mouth daily.   atorvastatin (LIPITOR) 80 MG tablet Take 1 tablet (80 mg total) by mouth daily.   Blood Glucose Monitoring Suppl (ACCU-CHEK GUIDE ME) w/Device KIT 1 each by Does not apply route daily.   cholecalciferol (VITAMIN D) 1000 UNITS tablet Take 1,000 Units by mouth daily.    ferrous sulfate 325 (65 FE) MG tablet Take 325 mg by mouth daily with breakfast.   folic acid (FOLVITE) 4026MCG tablet Take 400 mcg by mouth daily.   glucose blood test strip 1 each by Other route daily. Use as instructed   hydrochlorothiazide (HYDRODIURIL) 12.5 MG tablet Take 1 tablet (12.5 mg total) by mouth daily.   metFORMIN (GLUCOPHAGE-XR) 500 MG 24 hr tablet Take 1 tablet (500 mg total) by mouth every evening.   Multiple Vitamins-Minerals (CENTRUM SILVER 50+WOMEN PO) Take 1 tablet by mouth daily.   Cyanocobalamin (B-12 PO) Take by mouth.   No facility-administered encounter medications on file as of 09/21/2020.    Allergies (verified) Ace inhibitors   History: Past Medical History:  Diagnosis Date   Anemia    Chronic kidney disease, stage III (moderate) (HCC)    Diverticulosis    Elevated LFTs    Essential hypertension, malignant    Fibrocystic breast disease    Heart murmur    Hyperlipidemia    Menopausal problem    Osteoarthrosis    Osteoporosis    Peripheral autonomic neuropathy    Presence of permanent cardiac  pacemaker    Sick sinus syndrome (HCC)    Dr. Alfredo Batty   Type II diabetes mellitus with renal manifestations ([place])    Vitamin D deficiency    Past Surgical History:  Procedure Laterality Date   CATARACT EXTRACTION W/PHACO Right 01/07/2019   Procedure: CATARACT EXTRACTION PHACO AND INTRAOCULAR LENS PLACEMENT (IOC) RIGHT DIABETIC 02:15.1          25.7%        34.69;  Surgeon: Birder Robson, MD;  Location: [place];  Service: Ophthalmology;  Laterality: Right;  Diabetic - oral meds   CATARACT EXTRACTION W/PHACO Left 01/28/2019   Procedure: CATARACT EXTRACTION PHACO AND INTRAOCULAR LENS PLACEMENT (IOC) LEFT DIABETIC 12.96  01:15.5;  Surgeon: Birder Robson, MD;  Location: [place];  Service: Ophthalmology;  Laterality: Left;  Diabetic - oral meds   COLONOSCOPY WITH PROPOFOL N/A 11/06/2016   Procedure: COLONOSCOPY WITH PROPOFOL;  Surgeon: Lucilla Lame, MD;  Location: [place] [facility];  Service: Endoscopy;  Laterality: N/A;  DIABETIC   ESOPHAGOGASTRODUODENOSCOPY (EGD) WITH PROPOFOL N/A 11/06/2016   Procedure: ESOPHAGOGASTRODUODENOSCOPY (EGD) WITH PROPOFOL;  Surgeon: Lucilla Lame, MD;  Location: [place];  Service: Endoscopy;  Laterality: N/A;   ESOPHAGOGASTRODUODENOSCOPY (EGD) WITH PROPOFOL N/A 01/23/2017   Procedure: ESOPHAGOGASTRODUODENOSCOPY (EGD) WITH PROPOFOL with PUSH;  Surgeon: Lucilla Lame, MD;  Location: [facility];  Service: Endoscopy;  Laterality: N/A;   GIVENS CAPSULE STUDY N/A 12/22/2016   Procedure: GIVENS CAPSULE STUDY;  Surgeon: Lucilla Lame, MD;  Location: [facility];  Service: Endoscopy;  Laterality: N/A;   INSERT / REPLACE / REMOVE PACEMAKER     PACEMAKER INSERTION  2006   POLYPECTOMY  11/06/2016   Procedure: POLYPECTOMY;  Surgeon: Lucilla Lame, MD;  Location: [place];  Service: Endoscopy;;   Family History  Problem Relation Age of Onset   Hypertension Mother    Stroke Mother    Diabetes Daughter    Stroke  Father    Healthy Sister    Hypertension Maternal Grandmother    Social History   Socioeconomic History   Marital status: Widowed    Spouse name: Shanon Brow   Number of children: 1   Years of education: 2 years of college   Highest education level: 12th grade  Occupational History   Occupation: Retired Agricultural engineer at [employer] Use   Smoking status: Never   Smokeless tobacco: Never   Tobacco comments:    smoking cessation materials not required  Vaping Use   Vaping Use: Never used  Substance and Sexual Activity   Alcohol use: No    Alcohol/week: 0.0 standard drinks   Drug use: No   Sexual activity: Never  Other Topics Concern   Not on file  Social History Narrative   She still has a house but her daughter and grand-daughter are staying at her house   She moved in with her older sister ( 6 years older), to help her out.    Social Determinants of Health   Financial Resource Strain: Low Risk    Difficulty of Paying Living Expenses:  Not hard at all  Food Insecurity: No Food Insecurity   Worried About Charity fundraiser in the Last Year: Never true   Ran Out of Food in the Last Year: Never true  Transportation Needs: No Transportation Needs   Lack of Transportation (Medical): No   Lack of Transportation (Non-Medical): No  Physical Activity: Sufficiently Active   Days of Exercise per Week: 7 days   Minutes of Exercise per Session: 30 min  Stress: No Stress Concern Present   Feeling of Stress : Not at all  Social Connections: Moderately Isolated   Frequency of Communication with Friends and Family: More than three times a week   Frequency of Social Gatherings with Friends and Family: More than three times a week   Attends Religious Services: More than 4 times per year   Active Member of [employer] or Organizations: No   Attends Archivist Meetings: Never   Marital Status: Widowed    Tobacco Counseling Counseling given: Not Answered Tobacco comments:  smoking cessation materials not required   Clinical Intake:  Pre-visit preparation completed: Yes  Pain : No/denies pain     Nutritional Risks: None Diabetes: Yes CBG done?: No Did pt. bring in CBG monitor from home?: No  How often do you need to have someone help you when you read instructions, pamphlets, or other written materials from your doctor or pharmacy?: 1 - Never  Nutrition Risk Assessment:  Has the patient had any N/V/D within the last 2 months?  No  Does the patient have any non-healing wounds?  No  Has the patient had any unintentional weight loss or weight gain?  No   Diabetes:  Is the patient diabetic?  Yes  If diabetic, was a CBG obtained today?  No  Did the patient bring in their glucometer from home?  No  How often do you monitor your CBG's? Every other day.   Financial Strains and Diabetes Management:  Are you having any financial strains with the device, your supplies or your medication? No .  Does the patient want to be seen by Chronic Care Management for management of their diabetes?  No  - already enrolled Would the patient like to be referred to a Nutritionist or for Diabetic Management?  No   Diabetic Exams:  Diabetic Eye Exam: Completed 09/25/19 negative retinopathy.   Diabetic Foot Exam: Completed 12/15/19.   Interpreter Needed?: No  Information entered by :: Clemetine Marker LPN   Activities of Daily Living In your present state of health, do you have any difficulty performing the following activities: 09/21/2020 08/16/2020  Hearing? N N  Comment declines hearing aids -  Vision? N N  Difficulty concentrating or making decisions? N N  Walking or climbing stairs? N N  Dressing or bathing? N N  Doing errands, shopping? N N  Preparing Food and eating ? N -  Using the Toilet? N -  In the past six months, have you accidently leaked urine? N -  Do you have problems with loss of bowel control? N -  Managing your Medications? N -  Managing your  Finances? N -  Housekeeping or managing your Housekeeping? N -  Some recent data might be hidden    Patient Care Team: Steele Sizer, MD as PCP - General (Family Medicine) Yolonda Kida, MD as Consulting Physician (Cardiology) Earlie Server, MD as Consulting Physician (Oncology) Germaine Pomfret, [facility] as Pharmacist (Pharmacist)  Indicate any recent Medical Services you may have  received from other than [facility] providers in the past year (date may be approximate).     Assessment:   This is a routine wellness examination for [place].  Hearing/Vision screen Hearing Screening - Comments:: Pt denies hearing difficulty Vision Screening - Comments:: [facility] screen[facility]  Dietary issues and exercise activities discussed: Current Exercise Habits: Home exercise routine, Type of exercise: walking, Time (Minutes): 30, Frequency (Times/Week): 7, Weekly Exercise (Minutes/Week): 210, Intensity: Mild, Exercise limited by: None identified   Goals Addressed             This Visit's Progress    DIET - INCREASE WATER INTAKE   On track    Recommend to drink at least 6-8 8oz glasses of water per day.        Depression Screen PHQ 2/9 Scores 09/21/2020 08/16/2020 04/20/2020 02/10/2020 12/15/2019 09/11/2019 08/12/2019  PHQ - 2 Score 0 0 0 0 0 0 0  PHQ- 9 Score - - - - - - 0  Exception Documentation - - - - - - -    Fall Risk Fall Risk  09/21/2020 08/16/2020 04/20/2020 02/10/2020 12/15/2019  Falls in the past year? 0 0 0 0 0  Number falls in past yr: 0 0 0 0 0  Injury with Fall? 0 0 0 0 0  Comment - - - - -  Risk for fall due to : No Fall Risks - - - -  Risk for fall due to: Comment - - - - -  Follow up Falls prevention discussed - - - -    FALL RISK PREVENTION PERTAINING TO THE HOME:  Any stairs in or around the home? Yes  If so, are there any without handrails? No  Home free of loose throw rugs in walkways, pet beds, electrical cords, etc? Yes  Adequate lighting  in your home to reduce risk of falls? Yes   ASSISTIVE DEVICES UTILIZED TO PREVENT FALLS:  Life alert? No  Use of a cane, walker or w/c? No  Grab bars in the bathroom? No  Shower chair or bench in shower? Yes  Elevated toilet seat or a handicapped toilet? Yes   TIMED UP AND GO:  Was the test performed? No .    Cognitive Function: Normal cognitive status assessed by direct observation by this Nurse Health Advisor. No abnormalities found.       6CIT Screen 09/11/2019 09/10/2018 05/18/2017  What Year? 0 points 0 points 0 points  What month? 0 points 0 points 0 points  What time? 0 points 0 points 3 points  Count back from 20 0 points 0 points 0 points  Months in reverse 0 points 2 points 0 points  Repeat phrase 2 points 0 points 0 points  Total Score 2 2 3     Immunizations Immunization History  Administered Date(s) Administered   Fluad Quad(high Dose 65+) 11/19/2018, 12/15/2019   Influenza, High Dose Seasonal PF 11/30/2014, 12/09/2015, 12/11/2016, 12/17/2017   Influenza-Unspecified 10/28/2013   PFIZER Comirnaty(Gray Top)Covid-19 Tri-Sucrose Vaccine 09/17/2020   PFIZER(Purple Top)SARS-COV-2 Vaccination 04/03/2019, 04/24/2019, 11/27/2019   Pneumococcal Conjugate-13 07/31/2014   Pneumococcal Polysaccharide-23 08/11/2009   Tdap 08/11/2009, 10/16/2016   Zoster Recombinat (Shingrix) 08/18/2020   Zoster, Live 12/19/2011    TDAP status: Up to date  Flu Vaccine status: Up to date  Pneumococcal vaccine status: Up to date  Covid-19 vaccine status: Completed vaccines  Qualifies for Shingles Vaccine? Yes   Zostavax completed Yes   Shingrix Completed?: Yes due for second  dose 10/18/20  Screening Tests Health Maintenance  Topic Date Due   OPHTHALMOLOGY EXAM  09/24/2020   INFLUENZA VACCINE  09/27/2020   Zoster Vaccines- Shingrix (2 of 2) 10/13/2020   FOOT EXAM  12/14/2020   COVID-19 Vaccine (5 - Booster for Pfizer series) 01/18/2021   HEMOGLOBIN A1C  02/15/2021   TETANUS/TDAP   10/17/2026   DEXA SCAN  Completed   PNA vac Low Risk Adult  Completed   HPV VACCINES  Aged Out    Health Maintenance  There are no preventive care reminders to display for this patient.   Colorectal cancer screening: No longer required.   Mammogram status: No longer required due to age.  Bone Density status: Completed 10/02/19. Results reflect: Bone density results: OSTEOPENIA. Repeat every as directed years.  Lung Cancer Screening: (Low Dose CT Chest recommended if Age 57-80 years, 30 pack-year currently smoking OR have quit w/in 15years.) does not qualify.   Additional Screening:  Hepatitis C Screening: does not qualify  Vision Screening: Recommended annual ophthalmology exams for early detection of glaucoma and other disorders of the eye. Is the patient up to date with their annual eye exam?  Yes  Who is the provider or what is the name of the office in which the patient attends annual eye exams? [facility].   Dental Screening: Recommended annual dental exams for proper oral hygiene  Community Resource Referral / Chronic Care Management: CRR required this visit?  No   CCM required this visit?  No      Plan:     I have personally reviewed and noted the following in the patient's chart:   Medical and social history Use of alcohol, tobacco or illicit drugs  Current medications and supplements including opioid prescriptions.  Functional ability and status Nutritional status Physical activity Advanced directives List of other physicians Hospitalizations, surgeries, and ER visits in previous 12 months Vitals Screenings to include cognitive, depression, and falls Referrals and appointments  In addition, I have reviewed and discussed with patient certain preventive protocols, quality metrics, and best practice recommendations. A written personalized care plan for preventive services as well as general preventive health recommendations were provided to  patient.     Clemetine Marker, LPN   [id number]   Nurse Notes: none

## 2020-09-24 DIAGNOSIS — E119 Type 2 diabetes mellitus without complications: Secondary | ICD-10-CM | POA: Diagnosis not present

## 2020-09-24 LAB — HM DIABETES EYE EXAM

## 2020-09-27 ENCOUNTER — Other Ambulatory Visit: Payer: Self-pay | Admitting: Family Medicine

## 2020-09-27 DIAGNOSIS — I495 Sick sinus syndrome: Secondary | ICD-10-CM | POA: Diagnosis not present

## 2020-09-27 DIAGNOSIS — E1122 Type 2 diabetes mellitus with diabetic chronic kidney disease: Secondary | ICD-10-CM | POA: Diagnosis not present

## 2020-09-27 DIAGNOSIS — N182 Chronic kidney disease, stage 2 (mild): Secondary | ICD-10-CM | POA: Diagnosis not present

## 2020-09-27 DIAGNOSIS — E7800 Pure hypercholesterolemia, unspecified: Secondary | ICD-10-CM | POA: Diagnosis not present

## 2020-09-27 DIAGNOSIS — Z8679 Personal history of other diseases of the circulatory system: Secondary | ICD-10-CM | POA: Diagnosis not present

## 2020-09-27 DIAGNOSIS — E785 Hyperlipidemia, unspecified: Secondary | ICD-10-CM

## 2020-09-27 DIAGNOSIS — N1831 Chronic kidney disease, stage 3a: Secondary | ICD-10-CM | POA: Diagnosis not present

## 2020-09-27 DIAGNOSIS — R011 Cardiac murmur, unspecified: Secondary | ICD-10-CM | POA: Diagnosis not present

## 2020-09-27 DIAGNOSIS — Z950 Presence of cardiac pacemaker: Secondary | ICD-10-CM | POA: Diagnosis not present

## 2020-09-27 DIAGNOSIS — E1169 Type 2 diabetes mellitus with other specified complication: Secondary | ICD-10-CM

## 2020-09-27 DIAGNOSIS — I10 Essential (primary) hypertension: Secondary | ICD-10-CM | POA: Diagnosis not present

## 2020-11-03 ENCOUNTER — Other Ambulatory Visit: Payer: Self-pay | Admitting: Family Medicine

## 2020-11-03 DIAGNOSIS — E1122 Type 2 diabetes mellitus with diabetic chronic kidney disease: Secondary | ICD-10-CM

## 2020-11-08 ENCOUNTER — Other Ambulatory Visit: Payer: Self-pay | Admitting: Family Medicine

## 2020-11-08 DIAGNOSIS — E1169 Type 2 diabetes mellitus with other specified complication: Secondary | ICD-10-CM

## 2020-11-08 DIAGNOSIS — E785 Hyperlipidemia, unspecified: Secondary | ICD-10-CM

## 2020-11-17 NOTE — Progress Notes (Signed)
Name: Emma Campbell   MRN: [id number]    DOB: [birth year]   Date:11/18/2020       Progress Note  Subjective  Chief Complaint  Follow up   HPI  DMII with renal manifestation: CKI and microalbuminuria. Diagnosed many years ago. A1C has been well controlled, it was 6.1 %  last visit 6.5 % now is down to 5.9 %, advised her to only take Metformin when she will splurge on her diet  Glucose at home is getting checked a couple of times weekly , and it has been in the low 100's She denies polyphagia, polydipsia or polyuria - only has frequency right after she takes HCTZ. She denies hypoglycemic episodes.Continue ARB for kidney protection. Eye exam is up to date. She is on statin therapy for dyslipidemia and last LDL at goal .    HTN: taking medication daily and bp is at goal, no dizziness, chest pain or palpitation. BP has been 120's-140's  denies orthostatic changes   She tried to stop HCTZ but caused some swelling but controlled with taking it a few times a week.    Hyperlipidemia: she has been on Atorvastatin last LDL was at goal, she is on to 80 mg daily .   Sick Sinus Syndrome: she had a pacemaker placed in 2006 and has been on Amiodarone since 2006, she is now down to 100 mg of Amiodarone daily, last TSH still normal.  No chest pain or palpitation. Sees Dr. Clayborn Bigness , she is [name] score of 2, and is on aspirin only per his recommendation.  She also has a history of Afib that has been rate controlled. She also has a history of AVM's and not good candidate for blood thinners. Last pacemaker check was normal   Malnutrition: lost a few more pounds, unable to gain weight since COVID-19 in 2021, she lost another few pounds since last visit, we will stop metfomin and increase calorie intake    Vitamin D deficiency: still taking supplementation, denies fatigue, last Vitamin D was normal     Osteopenia: last FRAX 09/2019 was good risk of major fracture was 4.6 % and hip was 1.1 % , on vitamin D only. Recheck  in one year    Anemia: iron deficiency and positive hemoccult, seen by Dr. Durwin Reges and had EGD and colonoscopy September 2018,diagnosed with AVM small bowel and had it cauterized, hemoglobin dropped and was referred to Dr. Tasia Catchings, last labs showed high B12 and no anemia, supplements were stopped but when she went back for recheck 05/2020 B12 was low again, she has been taking 1000 mcg since she received the phone call from Dr. Tasia Catchings , she has an order for repeat labs already placed by Dr. Tasia Catchings    Patient Active Problem List   Diagnosis Date Noted   B12 deficiency 05/21/2017   Gastrointestinal tract imaging abnormality    Iron deficiency anemia    Polyp of sigmoid colon    Arthritis, degenerative 05/03/2015   Menopausal and perimenopausal disorder 05/03/2015   Chronic kidney disease (CKD), stage III (moderate) ([place]) 11/30/2014   Diverticulosis 11/30/2014   Fibrocystic breast disease 11/30/2014   Elevated LFTs 33/29/5188   Systolic ejection murmur 41/66/0630   Paroxysmal A-fib ([place]) 11/30/2014   Microalbuminuria 07/31/2014   Elevated TSH 07/31/2014   History of cardiac pacemaker 07/31/2014   Hypercholesteremia 08/13/2013   Hypertension, benign 08/13/2013   Diabetes mellitus with renal manifestations, controlled ([place]) 08/13/2013   Avitaminosis D 04/12/2009    Past Surgical  History:  Procedure Laterality Date   CATARACT EXTRACTION W/PHACO Right 01/07/2019   Procedure: CATARACT EXTRACTION PHACO AND INTRAOCULAR LENS PLACEMENT (IOC) RIGHT DIABETIC 02:15.1          25.7%        34.69;  Surgeon: Birder Robson, MD;  Location: [place];  Service: Ophthalmology;  Laterality: Right;  Diabetic - oral meds   CATARACT EXTRACTION W/PHACO Left 01/28/2019   Procedure: CATARACT EXTRACTION PHACO AND INTRAOCULAR LENS PLACEMENT (IOC) LEFT DIABETIC 12.96  01:15.5;  Surgeon: Birder Robson, MD;  Location: [place];  Service: Ophthalmology;  Laterality: Left;  Diabetic - oral meds    COLONOSCOPY WITH PROPOFOL N/A 11/06/2016   Procedure: COLONOSCOPY WITH PROPOFOL;  Surgeon: Lucilla Lame, MD;  Location: [place];  Service: Endoscopy;  Laterality: N/A;  DIABETIC   ESOPHAGOGASTRODUODENOSCOPY (EGD) WITH PROPOFOL N/A 11/06/2016   Procedure: ESOPHAGOGASTRODUODENOSCOPY (EGD) WITH PROPOFOL;  Surgeon: Lucilla Lame, MD;  Location: [place];  Service: Endoscopy;  Laterality: N/A;   ESOPHAGOGASTRODUODENOSCOPY (EGD) WITH PROPOFOL N/A 01/23/2017   Procedure: ESOPHAGOGASTRODUODENOSCOPY (EGD) WITH PROPOFOL with PUSH;  Surgeon: Lucilla Lame, MD;  Location: [facility] ENDOSCOPY;  Service: Endoscopy;  Laterality: N/A;   GIVENS CAPSULE STUDY N/A 12/22/2016   Procedure: GIVENS CAPSULE STUDY;  Surgeon: Lucilla Lame, MD;  Location[facility] ENDOSCOPY;  Service: Endoscopy;  Laterality: N/A;   INSERT / REPLACE / REMOVE PACEMAKER     PACEMAKER INSERTION  2006   POLYPECTOMY  11/06/2016   Procedure: POLYPECTOMY;  Surgeon: Lucilla Lame, MD;  Location: [place];  Service: Endoscopy;;    Family History  Problem Relation Age of Onset   Hypertension Mother    Stroke Mother    Diabetes Daughter    Stroke Father    Healthy Sister    Hypertension Maternal Grandmother     Social History   Tobacco Use   Smoking status: Never   Smokeless tobacco: Never   Tobacco comments:    smoking cessation materials not required  Substance Use Topics   Alcohol use: No    Alcohol/week: 0.0 standard drinks     Current Outpatient Medications:    Accu-Chek Softclix Lancets lancets, 100 each by Other route daily. Use as instructed, Disp: 100 each, Rfl: 2   Alcohol Swabs (B-D SINGLE USE SWABS REGULAR) PADS, USE AS DIRECTED THREE TIMES DAILY AS NEEDED, Disp: 100 each, Rfl: 3   amiodarone (PACERONE) 200 MG tablet, Take 100 mg by mouth daily., Disp: , Rfl:    amLODipine-valsartan (EXFORGE) 5-160 MG tablet, Take 1 tablet by mouth daily., Disp: 90 tablet, Rfl: 1   aspirin 81 MG tablet, Take 81 mg  by mouth daily., Disp: , Rfl:    atorvastatin (LIPITOR) 80 MG tablet, TAKE 1 TABLET EVERY DAY, Disp: 90 tablet, Rfl: 1   Blood Glucose Monitoring Suppl (ACCU-CHEK GUIDE ME) w/Device KIT, 1 each by Does not apply route daily., Disp: 1 kit, Rfl: 0   cholecalciferol (VITAMIN D) 1000 UNITS tablet, Take 1,000 Units by mouth daily. , Disp: , Rfl:    Cyanocobalamin (B-12 PO), Take by mouth., Disp: , Rfl:    ferrous sulfate 325 (65 FE) MG tablet, Take 325 mg by mouth daily with breakfast., Disp: , Rfl:    folic acid (FOLVITE) 431 MCG tablet, Take 400 mcg by mouth daily., Disp: , Rfl:    glucose blood test strip, 1 each by Other route daily. Use as instructed, Disp: 100 each, Rfl: 2   hydrochlorothiazide (HYDRODIURIL) 12.5 MG tablet,  Take 1 tablet (12.5 mg total) by mouth daily., Disp: 90 tablet, Rfl: 1   metFORMIN (GLUCOPHAGE-XR) 500 MG 24 hr tablet, TAKE 1 TABLET EVERY EVENING, Disp: 90 tablet, Rfl: 1   Multiple Vitamins-Minerals (CENTRUM SILVER 50+WOMEN PO), Take 1 tablet by mouth daily., Disp: , Rfl:    SHINGRIX injection, , Disp: , Rfl:   Allergies  Allergen Reactions   Ace Inhibitors Other (See Comments)    Pt unable to recall. Was advised to avoid    I personally reviewed active problem list, medication list, allergies, family history, social history, health maintenance with the patient/caregiver today.   ROS   Constitutional: Negative for fever or significant weight change.  Respiratory: Negative for cough and shortness of breath.   Cardiovascular: Negative for chest pain or palpitations.  Gastrointestinal: Negative for abdominal pain, no bowel changes.  Musculoskeletal: Negative for gait problem or joint swelling.  Skin: Negative for rash.  Neurological: Negative for dizziness or headache.  No other specific complaints in a complete review of systems (except as listed in HPI above).   Objective  Vitals:   11/18/20 1000  BP: 116/68  Pulse: 81  Resp: 14  Temp: 98.2 F (36.8 C)   TempSrc: Oral  SpO2: 96%  Weight: 106 lb 8 oz (48.3 kg)  Height: _0  (1.549 m)    Body mass index is 20.12 kg/m.  Physical Exam  Constitutional: Patient appears well-developed and well-nourished.  No distress.  HEENT: head atraumatic, normocephalic, pupils equal and reactive to light, neck supple Cardiovascular: Normal rate, irregular rhythm 3/6 SEM heard. Trace  BLE ankle edema. Pulmonary/Chest: Effort normal and breath sounds normal. No respiratory distress. Abdominal: Soft.  There is no tenderness. Psychiatric: Patient has a normal mood and affect. behavior is normal. Judgment and thought content normal.   Recent Results (from the past 2160 hour(s))  HM DIABETES EYE EXAM     Status: None   Collection Time: 09/24/20 12:00 AM  Result Value Ref Range   HM Diabetic Eye Exam No Retinopathy No Retinopathy  POCT HgB A1C     Status: Normal   Collection Time: 11/18/20 10:03 AM  Result Value Ref Range   Hemoglobin A1C 5.6 4.0 - 5.6 %   HbA1c POC (<> result, manual entry)     HbA1c, POC (prediabetic range)     HbA1c, POC (controlled diabetic range)      Diabetic Foot Exam: Diabetic Foot Exam - Simple   Simple Foot Form Diabetic Foot exam was performed with the following findings: Yes 11/18/2020 10:34 AM  Visual Inspection See comments: Yes Sensation Testing Intact to touch and monofilament testing bilaterally: Yes Pulse Check Posterior Tibialis and Dorsalis pulse intact bilaterally: Yes Comments Thick toenails       PHQ2/9: Depression screen [facility] 2/9 11/18/2020 09/21/2020 08/16/2020 04/20/2020 02/10/2020  Decreased Interest 0 0 0 0 0  Down, Depressed, Hopeless 0 0 0 0 0  PHQ - 2 Score 0 0 0 0 0  Altered sleeping - - - - -  Tired, decreased energy - - - - -  Change in appetite - - - - -  Feeling bad or failure about yourself  - - - - -  Trouble concentrating - - - - -  Moving slowly or fidgety/restless - - - - -  Suicidal thoughts - - - - -  PHQ-9 Score - - - - -   Difficult doing work/chores - - - - -  Some recent data might be hidden  phq 9 is negative   Fall Risk: Fall Risk  11/18/2020 09/21/2020 08/16/2020 04/20/2020 02/10/2020  Falls in the past year? 0 0 0 0 0  Number falls in past yr: - 0 0 0 0  Injury with Fall? - 0 0 0 0  Comment - - - - -  Risk for fall due to : - No Fall Risks - - -  Risk for fall due to: Comment - - - - -  Follow up Falls prevention discussed Falls prevention discussed - - -     Functional Status Survey: Is the patient deaf or have difficulty hearing?: No Does the patient have difficulty seeing, even when wearing glasses/contacts?: No Does the patient have difficulty concentrating, remembering, or making decisions?: No Does the patient have difficulty walking or climbing stairs?: No Does the patient have difficulty dressing or bathing?: No Does the patient have difficulty doing errands alone such as visiting a doctor's office or shopping?: No   Assessment & Plan  1. Type 2 diabetes mellitus with stage 2 chronic kidney disease, without long-term current use of insulin (HCC)  - POCT HgB A1C  2. Paroxysmal A-fib ([place])   3. Dyslipidemia associated with type 2 diabetes mellitus (HCC)  LDL at goal   4. B12 deficiency  Taking supplements and has follow up with Dr. Tasia Catchings  5. Need for immunization against influenza  - Flu vaccine HIGH DOSE PF (Fluzone High dose)  6. Hypertension, benign  Towards low end of normal, but goes up at home, advised to monitor for dizziness and we can adjust dose of Azor   7. Vitamin D deficiency   8. Sick sinus syndrome ([place])   9. Mild protein-calorie malnutrition ([place])   10. Pacemaker   11. Iron deficiency anemia due to chronic blood loss  Keep follow up with Dr. Tasia Catchings   12. Osteopenia of multiple sites

## 2020-11-18 ENCOUNTER — Encounter: Payer: Self-pay | Admitting: Family Medicine

## 2020-11-18 ENCOUNTER — Ambulatory Visit (INDEPENDENT_AMBULATORY_CARE_PROVIDER_SITE_OTHER): Payer: Medicare HMO | Admitting: Family Medicine

## 2020-11-18 ENCOUNTER — Other Ambulatory Visit: Payer: Self-pay

## 2020-11-18 VITALS — BP 116/68 | HR 81 | Temp 98.2°F | Resp 14 | Ht 61.0 in | Wt 106.5 lb

## 2020-11-18 DIAGNOSIS — I480 Paroxysmal atrial fibrillation: Secondary | ICD-10-CM

## 2020-11-18 DIAGNOSIS — D500 Iron deficiency anemia secondary to blood loss (chronic): Secondary | ICD-10-CM

## 2020-11-18 DIAGNOSIS — I495 Sick sinus syndrome: Secondary | ICD-10-CM

## 2020-11-18 DIAGNOSIS — M8589 Other specified disorders of bone density and structure, multiple sites: Secondary | ICD-10-CM

## 2020-11-18 DIAGNOSIS — E1169 Type 2 diabetes mellitus with other specified complication: Secondary | ICD-10-CM

## 2020-11-18 DIAGNOSIS — Z950 Presence of cardiac pacemaker: Secondary | ICD-10-CM

## 2020-11-18 DIAGNOSIS — N182 Chronic kidney disease, stage 2 (mild): Secondary | ICD-10-CM

## 2020-11-18 DIAGNOSIS — I10 Essential (primary) hypertension: Secondary | ICD-10-CM

## 2020-11-18 DIAGNOSIS — E538 Deficiency of other specified B group vitamins: Secondary | ICD-10-CM | POA: Diagnosis not present

## 2020-11-18 DIAGNOSIS — E559 Vitamin D deficiency, unspecified: Secondary | ICD-10-CM

## 2020-11-18 DIAGNOSIS — E1122 Type 2 diabetes mellitus with diabetic chronic kidney disease: Secondary | ICD-10-CM | POA: Diagnosis not present

## 2020-11-18 DIAGNOSIS — Z23 Encounter for immunization: Secondary | ICD-10-CM

## 2020-11-18 DIAGNOSIS — E441 Mild protein-calorie malnutrition: Secondary | ICD-10-CM

## 2020-11-18 DIAGNOSIS — E785 Hyperlipidemia, unspecified: Secondary | ICD-10-CM

## 2020-11-18 LAB — POCT GLYCOSYLATED HEMOGLOBIN (HGB A1C): Hemoglobin A1C: 5.6 % (ref 4.0–5.6)

## 2020-11-29 ENCOUNTER — Telehealth: Payer: Self-pay

## 2020-11-29 NOTE — Progress Notes (Signed)
Chronic Care Management Pharmacy Assistant   Name: Emma Campbell  MRN: [id number] DOB: 08-23-1932  Reason for Encounter: Diabetic Disease State Call   Recent office visits:  11/18/2020 Emma Campbell (PCP Office Visit) for Follow-up- No medication changes noted; A1C lab ordered  09/21/2020 Emma Littler, LPN (Clinical Support Appointment) for Medicare Wellness Visit- no medication changes  Recent consult visits:  09/27/2020 Emma Campbell (Cardiology) for Follow-up- No Medication changes noted; no orders placed; patient instructed to follow-up in 6 months.  Hospital visits:  None in previous 6 months  Medications: Outpatient Encounter Medications as of 11/29/2020  Medication Sig Note   Accu-Chek Softclix Lancets lancets 100 each by Other route daily. Use as instructed    Alcohol Swabs (B-D SINGLE USE SWABS REGULAR) PADS USE AS DIRECTED THREE TIMES DAILY AS NEEDED    amiodarone (PACERONE) 200 MG tablet Take 100 mg by mouth daily. 09/22/2015: Taking half tablet   amLODipine-valsartan (EXFORGE) 5-160 MG tablet Take 1 tablet by mouth daily.    aspirin 81 MG tablet Take 81 mg by mouth daily.    atorvastatin (LIPITOR) 80 MG tablet TAKE 1 TABLET EVERY DAY    Blood Glucose Monitoring Suppl (ACCU-CHEK GUIDE ME) w/Device KIT 1 each by Does not apply route daily.    cholecalciferol (VITAMIN D) 1000 UNITS tablet Take 1,000 Units by mouth daily.     Cyanocobalamin (B-12 PO) Take by mouth.    ferrous sulfate 325 (65 FE) MG tablet Take 325 mg by mouth daily with breakfast.    folic acid (FOLVITE) 400 MCG tablet Take 400 mcg by mouth daily.    glucose blood test strip 1 each by Other route daily. Use as instructed    hydrochlorothiazide (HYDRODIURIL) 12.5 MG tablet Take 1 tablet (12.5 mg total) by mouth daily. 09/21/2020: Pt taking 3 times weekly; tues, thurs and sat   metFORMIN (GLUCOPHAGE-XR) 500 MG 24 hr tablet TAKE 1 TABLET EVERY EVENING    Multiple Vitamins-Minerals (CENTRUM  SILVER 50+WOMEN PO) Take 1 tablet by mouth daily.    SHINGRIX injection     No facility-administered encounter medications on file as of 11/29/2020.   Care Gaps: Zoster Vaccine  Star Rating Drugs: Atorvastatin 80 mg last filled on 07/16/2020 for a 90-Day supply with no pharmacy indicated Metformin 500 mg last filled on 09/01/2020 for a 90-Day supply with no pharmacy indicated Amlodipine-Valsartan 5-160 mg last filled on 08/25/2020 for a 90-Day supply with no pharmacy indicated  Recent Relevant Labs: Lab Results  Component Value Date/Time   HGBA1C 5.6 11/18/2020 10:03 AM   HGBA1C 6.5 (A) 08/16/2020 01:08 PM   HGBA1C 5.9 (H) 08/12/2019 10:00 AM   HGBA1C 6.4 04/19/2018 10:27 AM   HGBA1C 6.4 04/19/2018 10:27 AM   HGBA1C 6.4 04/19/2018 10:27 AM   HGBA1C 6.1 12/17/2017 11:11 AM   HGBA1C 6.4 08/15/2017 10:23 AM   MICROALBUR 1.5 08/20/2020 08:33 AM   MICROALBUR 1.1 08/12/2019 10:00 AM   MICROALBUR 20 01/08/2019 12:05 PM   MICROALBUR 20 08/15/2017 10:23 AM    Kidney Function Lab Results  Component Value Date/Time   CREATININE 0.68 08/20/2020 08:33 AM   CREATININE 0.93 06/07/2020 09:54 AM   CREATININE 0.94 12/12/2019 09:27 AM   CREATININE 0.94 (H) 08/12/2019 10:00 AM   GFRNONAA 79 08/20/2020 08:33 AM   GFRAA 92 08/20/2020 08:33 AM    Current antihyperglycemic regimen:  Metformin 500 mg 1 tablet every evening (taking differently)  What recent interventions/DTPs have been made to improve  glycemic control:  Patient stated that he blood sugars have been so well that Dr. [name] has stopped her from taking Metformin daily as she was loosing too much weight. Patient stated that now she only takes it when need.   Have there been any recent hospitalizations or ED visits since last visit with CPP? No  Patient denies hypoglycemic symptoms, including Pale, Sweaty, Shaky, Hungry, Nervous/irritable, and Vision changes  Patient denies hyperglycemic symptoms, including blurry vision, excessive  thirst, fatigue, polyuria, and weakness  How often are you checking your blood sugar? once daily  What are your blood sugars ranging?  Patient reports her blood sugar this morning before a meal was 135 but she did have a glass of juice last night. Patient stated her numbers are typically around 119,120.  During the week, how often does your blood glucose drop below 70? Never  Are you checking your feet daily/regularly? Yes patient reports no issues with her feet at this time, and stated the last time she had a PCP office appointment Dr. [name] checked her feet and informed her that her feet looked good.   Adherence Review: Is the patient currently on a STATIN medication? Yes Is the patient currently on ACE/ARB medication? Yes Does the patient have >5 day gap between last estimated fill dates? Yes  Patient stated that over all she is doing well and feels good. She is happy that her medications are being decreased due to her over all doing so well. She repots that she does not have to take Metformin every day anymore and she only has to take her hydrochlorothiazide 12.5 mg every other day. Patient reports that she still takes her blood pressure medication daily. Patient denies any pain, swelling or any ill symptoms today. Patient was informed that her appointment with [name], CPP was moved to 02/02/2021 @ 1300 and she was agreeable. Patient has no concerns or issues at this time.    Patient has a scheduled appointment on 02/02/2021 @ 1300.  [name], CPA/CMA Clinical Pharmacist Assistant Phone: [phone]

## 2020-12-08 ENCOUNTER — Inpatient Hospital Stay: Payer: Medicare HMO | Attending: Oncology

## 2020-12-08 ENCOUNTER — Other Ambulatory Visit: Payer: Self-pay

## 2020-12-08 DIAGNOSIS — D631 Anemia in chronic kidney disease: Secondary | ICD-10-CM | POA: Diagnosis not present

## 2020-12-08 DIAGNOSIS — Z8249 Family history of ischemic heart disease and other diseases of the circulatory system: Secondary | ICD-10-CM | POA: Insufficient documentation

## 2020-12-08 DIAGNOSIS — Z833 Family history of diabetes mellitus: Secondary | ICD-10-CM | POA: Diagnosis not present

## 2020-12-08 DIAGNOSIS — N183 Chronic kidney disease, stage 3 unspecified: Secondary | ICD-10-CM | POA: Insufficient documentation

## 2020-12-08 DIAGNOSIS — E538 Deficiency of other specified B group vitamins: Secondary | ICD-10-CM | POA: Insufficient documentation

## 2020-12-08 DIAGNOSIS — D500 Iron deficiency anemia secondary to blood loss (chronic): Secondary | ICD-10-CM

## 2020-12-08 DIAGNOSIS — Z8639 Personal history of other endocrine, nutritional and metabolic disease: Secondary | ICD-10-CM

## 2020-12-08 DIAGNOSIS — N1831 Chronic kidney disease, stage 3a: Secondary | ICD-10-CM

## 2020-12-08 DIAGNOSIS — I129 Hypertensive chronic kidney disease with stage 1 through stage 4 chronic kidney disease, or unspecified chronic kidney disease: Secondary | ICD-10-CM | POA: Diagnosis not present

## 2020-12-08 DIAGNOSIS — E611 Iron deficiency: Secondary | ICD-10-CM | POA: Insufficient documentation

## 2020-12-08 DIAGNOSIS — Z79899 Other long term (current) drug therapy: Secondary | ICD-10-CM | POA: Insufficient documentation

## 2020-12-08 DIAGNOSIS — Z823 Family history of stroke: Secondary | ICD-10-CM | POA: Insufficient documentation

## 2020-12-08 LAB — COMPREHENSIVE METABOLIC PANEL
ALT: 22 U/L (ref 0–44)
AST: 23 U/L (ref 15–41)
Albumin: 3.8 g/dL (ref 3.5–5.0)
Alkaline Phosphatase: 57 U/L (ref 38–126)
Anion gap: 7 (ref 5–15)
BUN: 17 mg/dL (ref 8–23)
CO2: 29 mmol/L (ref 22–32)
Calcium: 8.7 mg/dL — ABNORMAL LOW (ref 8.9–10.3)
Chloride: 101 mmol/L (ref 98–111)
Creatinine, Ser: 1.02 mg/dL — ABNORMAL HIGH (ref 0.44–1.00)
GFR, Estimated: 54 mL/min — ABNORMAL LOW (ref >60)
Glucose, Bld: 126 mg/dL — ABNORMAL HIGH (ref 70–99)
Potassium: 3.9 mmol/L (ref 3.5–5.1)
Sodium: 137 mmol/L (ref 135–145)
Total Bilirubin: 0.3 mg/dL (ref 0.3–1.2)
Total Protein: 7 g/dL (ref 6.5–8.1)

## 2020-12-08 LAB — CBC WITH DIFFERENTIAL/PLATELET
Abs Immature Granulocytes: 0 10*3/uL (ref 0.00–0.07)
Basophils Absolute: 0 10*3/uL (ref 0.0–0.1)
Basophils Relative: 1 %
Eosinophils Absolute: 0.2 10*3/uL (ref 0.0–0.5)
Eosinophils Relative: 4 %
HCT: 36.1 % (ref 36.0–46.0)
Hemoglobin: 11.7 g/dL — ABNORMAL LOW (ref 12.0–15.0)
Immature Granulocytes: 0 %
Lymphocytes Relative: 32 %
Lymphs Abs: 1.4 10*3/uL (ref 0.7–4.0)
MCH: 30 pg (ref 26.0–34.0)
MCHC: 32.4 g/dL (ref 30.0–36.0)
MCV: 92.6 fL (ref 80.0–100.0)
Monocytes Absolute: 0.3 10*3/uL (ref 0.1–1.0)
Monocytes Relative: 7 %
Neutro Abs: 2.5 10*3/uL (ref 1.7–7.7)
Neutrophils Relative %: 56 %
Platelets: 217 10*3/uL (ref 150–400)
RBC: 3.9 MIL/uL (ref 3.87–5.11)
RDW: 13.3 % (ref 11.5–15.5)
WBC: 4.4 10*3/uL (ref 4.0–10.5)
nRBC: 0 % (ref 0.0–0.2)

## 2020-12-08 LAB — FERRITIN: Ferritin: 129 ng/mL (ref 11–307)

## 2020-12-08 LAB — IRON AND TIBC
Iron: 82 ug/dL (ref 28–170)
Saturation Ratios: 25 % (ref 10.4–31.8)
TIBC: 330 ug/dL (ref 250–450)
UIBC: 248 ug/dL

## 2020-12-08 LAB — VITAMIN B12: Vitamin B-12: 853 pg/mL (ref 180–914)

## 2020-12-09 ENCOUNTER — Other Ambulatory Visit: Payer: Self-pay | Admitting: Family Medicine

## 2020-12-09 ENCOUNTER — Encounter: Payer: Self-pay | Admitting: Oncology

## 2020-12-09 ENCOUNTER — Inpatient Hospital Stay: Payer: Medicare HMO | Admitting: Oncology

## 2020-12-09 VITALS — BP 142/71 | HR 80 | Temp 97.5°F | Resp 14 | Wt 108.2 lb

## 2020-12-09 DIAGNOSIS — E538 Deficiency of other specified B group vitamins: Secondary | ICD-10-CM

## 2020-12-09 DIAGNOSIS — D500 Iron deficiency anemia secondary to blood loss (chronic): Secondary | ICD-10-CM

## 2020-12-09 DIAGNOSIS — E611 Iron deficiency: Secondary | ICD-10-CM | POA: Diagnosis not present

## 2020-12-09 DIAGNOSIS — Z833 Family history of diabetes mellitus: Secondary | ICD-10-CM | POA: Diagnosis not present

## 2020-12-09 DIAGNOSIS — Z79899 Other long term (current) drug therapy: Secondary | ICD-10-CM | POA: Diagnosis not present

## 2020-12-09 DIAGNOSIS — Z823 Family history of stroke: Secondary | ICD-10-CM | POA: Diagnosis not present

## 2020-12-09 DIAGNOSIS — N1831 Chronic kidney disease, stage 3a: Secondary | ICD-10-CM | POA: Diagnosis not present

## 2020-12-09 DIAGNOSIS — I129 Hypertensive chronic kidney disease with stage 1 through stage 4 chronic kidney disease, or unspecified chronic kidney disease: Secondary | ICD-10-CM | POA: Diagnosis not present

## 2020-12-09 DIAGNOSIS — N183 Chronic kidney disease, stage 3 unspecified: Secondary | ICD-10-CM | POA: Diagnosis not present

## 2020-12-09 DIAGNOSIS — D631 Anemia in chronic kidney disease: Secondary | ICD-10-CM | POA: Diagnosis not present

## 2020-12-09 DIAGNOSIS — Z8249 Family history of ischemic heart disease and other diseases of the circulatory system: Secondary | ICD-10-CM | POA: Diagnosis not present

## 2020-12-09 NOTE — Telephone Encounter (Signed)
Requested Prescriptions  Pending Prescriptions Disp Refills  . ACCU-CHEK GUIDE test strip [Pharmacy Med Name: ACCU-CHEK GUIDE   Strip] 100 strip 2    Sig: USE  AS  INSTRUCTED  DAILY     Endocrinology: Diabetes - Testing Supplies Passed - 12/09/2020  4:06 AM      Passed - Valid encounter within last 12 months    Recent Outpatient Visits          3 weeks ago Type 2 diabetes mellitus with stage 2 chronic kidney disease, without long-term current use of insulin [facility])   [facility] [place], Drue Stager, MD   3 months ago Type 2 diabetes mellitus with stage 2 chronic kidney disease, without long-term current use of insulin [facility])   [facility] [place], Drue Stager, MD   7 months ago Type 2 diabetes mellitus with stage 2 chronic kidney disease, without long-term current use of insulin [facility])   [facility] [place], Drue Stager, MD   10 months ago Dry cough   [facility] [place], Drue Stager, MD   12 months ago Type 2 diabetes mellitus with stage 2 chronic kidney disease, without long-term current use of insulin [facility])   [facility] Steele Sizer, MD      Future Appointments            In 3 months [name], Drue Stager, MD [facility], [place]   In 9 months  [facility], [place]           . Accu-Chek Softclix Lancets lancets [Pharmacy Med Name: ACCU-CHEK SOFTCLIX LANCETS] 100 each 2    Sig: USE AS DIRECTED EVERY DAY     Endocrinology: Diabetes - Testing Supplies Passed - 12/09/2020  4:06 AM      Passed - Valid encounter within last 12 months    Recent Outpatient Visits          3 weeks ago Type 2 diabetes mellitus with stage 2 chronic kidney disease, without long-term current use of insulin [facility])   [facility] [place], Drue Stager, MD   3 months ago Type 2 diabetes mellitus with stage 2 chronic kidney disease, without long-term current use of insulin [facility])   [facility] [place], Drue Stager, MD   7 months ago Type 2 diabetes mellitus with stage 2 chronic kidney disease, without long-term current use of insulin [facility])   [facility] [place], Drue Stager, MD   10 months ago Dry cough   [facility] [place], Drue Stager, MD   12 months ago Type 2 diabetes mellitus with stage 2 chronic kidney disease, without long-term current use of insulin [facility])   [facility] Steele Sizer, MD      Future Appointments            In 3 months [name], Drue Stager, MD [facility], [place]   In 9 months  [place]

## 2020-12-09 NOTE — Progress Notes (Signed)
Hematology/Oncology follow up note [facility] [phone] Fax:([phone]   Patient Care Team: Steele Sizer, MD as PCP - General (Family Medicine) Yolonda Kida, MD as Consulting Physician (Cardiology) Earlie Server, MD as Consulting Physician (Oncology) Germaine Pomfret, [facility] as Pharmacist (Pharmacist)  REFERRING PROVIDER: Steele Sizer, MD REASON FOR VISIT Follow up for treatment of anemia  HISTORY OF PRESENTING ILLNESS:  Emma Campbell is a  87 y.o.  female with PMH listed below who was referred to me for evaluation of anemia.  Patient follows up with primary care physician and has had labs done recently. 04/17/17 CBC showed hemoglobin 9, MCV 81.9, platelet 245,000, WBC 4.1, normal differential.  Iron panel showed TIBC 442, ferritin 11, saturation 15%. Previous GI workup:  # 01/23/2017 EGD showed non bleeding angiotecsia, treated with APC. 12/22/2016 Capsule study showed duodenum AVM.  11/06/2016 colonoscopy showed non bleeding hemorroids, sigmoid polyp, and diverticulosis.   INTERVAL HISTORY Emma Campbell is a 87 y.o. female who has above history reviewed by me today presents for follow up for management of iron deficiency anemia.  She take oral iron supplementation and feels well.  No new complaints.   Review of Systems  Constitutional:  Negative for chills, fever, malaise/fatigue and weight loss.  HENT:  Negative for sore throat.   Eyes:  Negative for redness.  Respiratory:  Negative for cough, shortness of breath and wheezing.   Cardiovascular:  Negative for chest pain, palpitations and leg swelling.  Gastrointestinal:  Negative for abdominal pain, blood in stool, nausea and vomiting.  Genitourinary:  Negative for dysuria.  Musculoskeletal:  Negative for myalgias.  Skin:  Negative for rash.  Neurological:  Negative for dizziness, tingling and tremors.  Endo/Heme/Allergies:  Does not bruise/bleed easily.  Psychiatric/Behavioral:   Negative for hallucinations.    MEDICAL HISTORY:  Past Medical History:  Diagnosis Date   Anemia    Chronic kidney disease, stage III (moderate) (HCC)    Diverticulosis    Elevated LFTs    Essential hypertension, malignant    Fibrocystic breast disease    Heart murmur    Hyperlipidemia    Menopausal problem    Osteoarthrosis    Osteoporosis    Peripheral autonomic neuropathy    Presence of permanent cardiac pacemaker    Sick sinus syndrome (HCC)    Dr. Alfredo Batty   Type II diabetes mellitus with renal manifestations ([place])    Vitamin D deficiency     SURGICAL HISTORY: Past Surgical History:  Procedure Laterality Date   CATARACT EXTRACTION W/PHACO Right 01/07/2019   Procedure: CATARACT EXTRACTION PHACO AND INTRAOCULAR LENS PLACEMENT (IOC) RIGHT DIABETIC 02:15.1          25.7%        34.69;  Surgeon: Birder Robson, MD;  Location: [place];  Service: Ophthalmology;  Laterality: Right;  Diabetic - oral meds   CATARACT EXTRACTION W/PHACO Left 01/28/2019   Procedure: CATARACT EXTRACTION PHACO AND INTRAOCULAR LENS PLACEMENT (IOC) LEFT DIABETIC 12.96  01:15.5;  Surgeon: Birder Robson, MD;  Location: [place];  Service: Ophthalmology;  Laterality: Left;  Diabetic - oral meds   COLONOSCOPY WITH PROPOFOL N/A 11/06/2016   Procedure: COLONOSCOPY WITH PROPOFOL;  Surgeon: Lucilla Lame, MD;  Location: [place];  Service: Endoscopy;  Laterality: N/A;  DIABETIC   ESOPHAGOGASTRODUODENOSCOPY (EGD) WITH PROPOFOL N/A 11/06/2016   Procedure: ESOPHAGOGASTRODUODENOSCOPY (EGD) WITH PROPOFOL;  Surgeon: Lucilla Lame, MD;  Location: [place];  Service: Endoscopy;  Laterality: N/A;  ESOPHAGOGASTRODUODENOSCOPY (EGD) WITH PROPOFOL N/A 01/23/2017   Procedure: ESOPHAGOGASTRODUODENOSCOPY (EGD) WITH PROPOFOL with PUSH;  Surgeon: Lucilla Lame, MD;  Location: [facility] END[facility]COPY;  Service: Endoscopy;  Laterality: N/A;   GIVENS CAPSULE STUDY N/A 12/22/2016    Procedure: GIVENS CAPSULE STUDY;  Surgeon: Lucilla Lame, MD;  [facility];  Service: Endoscopy;  Laterality: N/A;   INSERT / REPLACE / REMOVE PACEMAKER     PACEMAKER INSERTION  2006   POLYPECTOMY  11/06/2016   Procedure: POLYPECTOMY;  Surgeon: Lucilla Lame, MD;  Location: [place];  Service: Endoscopy;;    SOCIAL HISTORY: Social History   Socioeconomic History   Marital status: Widowed    Spouse name: Shanon Brow   Number of children: 1   Years of education: 2 years of college   Highest education level: 12th grade  Occupational History   Occupation: Retired Agricultural engineer at [employer] Use   Smoking status: Never   Smokeless tobacco: Never   Tobacco comments:    smoking cessation materials not required  Vaping Use   Vaping Use: Never used  Substance and Sexual Activity   Alcohol use: No    Alcohol/week: 0.0 standard drinks   Drug use: No   Sexual activity: Never  Other Topics Concern   Not on file  Social History Narrative   She still has a house but her daughter and grand-daughter are staying at her house   She moved in with her older sister ( 71 years older), to help her out.    Social Determinants of Health   Financial Resource Strain: Low Risk    Difficulty of Paying Living Expenses: Not hard at all  Food Insecurity: No Food Insecurity   Worried About Charity fundraiser in the Last Year: Never true   [place] in the Last Year: Never true  Transportation Needs: No Transportation Needs   Lack of Transportation (Medical): No   Lack of Transportation (Non-Medical): No  Physical Activity: Sufficiently Active   Days of Exercise per Week: 7 days   Minutes of Exercise per Session: 30 min  Stress: No Stress Concern Present   Feeling of Stress : Not at all  Social Connections: Moderately Isolated   Frequency of Communication with Friends and Family: More than three times a week   Frequency of Social Gatherings with Friends and Family:  More than three times a week   Attends Religious Services: More than 4 times per year   Active Member of [employer] or Organizations: No   Attends Archivist Meetings: Never   Marital Status: Widowed  Human resources officer Violence: Not At Risk   Fear of Current or Ex-Partner: No   Emotionally Abused: No   Physically Abused: No   Sexually Abused: No    FAMILY HISTORY: Family History  Problem Relation Age of Onset   Hypertension Mother    Stroke Mother    Diabetes Daughter    Stroke Father    Healthy Sister    Hypertension Maternal Grandmother     ALLERGIES:  is allergic to ace inhibitors.  MEDICATIONS:  Current Outpatient Medications  Medication Sig Dispense Refill   ACCU-CHEK GUIDE test strip USE  AS  INSTRUCTED  DAILY 100 strip 2   Accu-Chek Softclix Lancets lancets USE AS DIRECTED EVERY DAY 100 each 2   Alcohol Swabs (B-D SINGLE USE SWABS REGULAR) PADS USE AS DIRECTED THREE TIMES DAILY AS NEEDED 100 each 3   amiodarone (PACERONE) 200 MG tablet  Take 100 mg by mouth daily.     amLODipine-valsartan (EXFORGE) 5-160 MG tablet Take 1 tablet by mouth daily. 90 tablet 1   aspirin 81 MG tablet Take 81 mg by mouth daily.     atorvastatin (LIPITOR) 80 MG tablet TAKE 1 TABLET EVERY DAY 90 tablet 1   Blood Glucose Monitoring Suppl (ACCU-CHEK GUIDE ME) w/Device KIT 1 each by Does not apply route daily. 1 kit 0   cholecalciferol (VITAMIN D) 1000 UNITS tablet Take 1,000 Units by mouth daily.      Cyanocobalamin (B-12 PO) Take by mouth.     ferrous sulfate 325 (65 FE) MG tablet Take 325 mg by mouth daily with breakfast.     folic acid (FOLVITE) 027 MCG tablet Take 400 mcg by mouth daily.     hydrochlorothiazide (HYDRODIURIL) 12.5 MG tablet Take 1 tablet (12.5 mg total) by mouth daily. 90 tablet 1   Multiple Vitamins-Minerals (CENTRUM SILVER 50+WOMEN PO) Take 1 tablet by mouth daily.     SHINGRIX injection      metFORMIN (GLUCOPHAGE-XR) 500 MG 24 hr tablet TAKE 1 TABLET EVERY EVENING  (Patient not taking: Reported on 12/09/2020) 90 tablet 1   No current facility-administered medications for this visit.     PHYSICAL EXAMINATION: ECOG PERFORMANCE STATUS: 1 - Symptomatic but completely ambulatory Vitals:   12/09/20 1007  BP: (!) 142/71  Pulse: 80  Resp: 14  Temp: (!) 97.5 F (36.4 C)  SpO2: 100%   Filed Weights   12/09/20 1007  Weight: 108 lb 3.2 oz (49.1 kg)    Physical Exam Constitutional:      General: She is not in acute distress.    Appearance: She is not diaphoretic.     Comments: Thin built   HENT:     Head: Normocephalic and atraumatic.     Nose: Nose normal.     Mouth/Throat:     Pharynx: No oropharyngeal exudate.  Eyes:     General: No scleral icterus.       Left eye: No discharge.     Pupils: Pupils are equal, round, and reactive to light.  Neck:     Vascular: No JVD.  Cardiovascular:     Rate and Rhythm: Normal rate and regular rhythm.     Heart sounds: Normal heart sounds. No murmur heard.   No friction rub.  Pulmonary:     Effort: Pulmonary effort is normal. No respiratory distress.     Breath sounds: Normal breath sounds. No wheezing or rales.  Chest:     Chest wall: No tenderness.  Abdominal:     General: Bowel sounds are normal. There is no distension.     Palpations: Abdomen is soft.     Tenderness: There is no abdominal tenderness.  Musculoskeletal:        General: No tenderness. Normal range of motion.     Cervical back: Normal range of motion and neck supple.  Lymphadenopathy:     Cervical: No cervical adenopathy.  Skin:    General: Skin is warm and dry.     Findings: No erythema or rash.  Neurological:     Mental Status: She is alert and oriented to person, place, and time.     Cranial Nerves: No cranial nerve deficit.     Motor: No abnormal muscle tone.     Coordination: Coordination normal.  Psychiatric:        Mood and Affect: Affect normal.        Judgment:  Judgment normal.     LABORATORY DATA:  I have  reviewed the data as listed Lab Results  Component Value Date   WBC 4.4 12/08/2020   HGB 11.7 (L) 12/08/2020   HCT 36.1 12/08/2020   MCV 92.6 12/08/2020   PLT 217 12/08/2020   Recent Labs    12/12/19 0927 06/07/20 0954 08/20/20 0833 12/08/20 1052  NA 137 140 139 137  K 4.0 3.8 4.3 3.9  CL 100 105 102 101  CO2 _0 GLUCOSE 126* 125* 108* 126*  BUN _1 CREATININE 0.94 0.93 0.68 1.02*  CALCIUM 9.0 8.8* 9.3 8.7*  GFRNONAA 55* 60* 79 54*  GFRAA  --   --  92  --   PROT 7.3 7.2 6.5 7.0  ALBUMIN 4.2 4.0  --  3.8  AST _2 ALT _3 ALKPHOS 46 51  --  57  BILITOT 0.7 0.7 0.5 0.3     Iron/TIBC/Ferritin/ %Sat    Component Value Date/Time   IRON 82 12/08/2020 1052   TIBC 330 12/08/2020 1052   FERRITIN 129 12/08/2020 1052   IRONPCTSAT 25 12/08/2020 1052   IRONPCTSAT 15 04/11/2016 1103   SPEP not observed M spike.   ASSESSMENT & PLAN:  1. Iron deficiency anemia due to chronic blood loss   2. Anemia of chronic renal failure, stage 3a ([place])   3. B12 deficiency    # Iron deficiency anemia, Labs are reviewed and discussed with patient. Hemoglobin 11.7, ferritin is stable at 129.  Continue oral iron.   #History of vitamin B12 deficiency.  Continue vitamin B12 1019mg daily # Anemia of CKD, hemoglobin is above 10.  At goal.  No need for intervention at this point.  Orders Placed This Encounter  Procedures   CBC with Differential/Platelet    Standing Status:   Future    Standing Expiration Date:   12/09/2021   Ferritin    Standing Status:   Future    Standing Expiration Date:   12/09/2021   Iron and TIBC    Standing Status:   Future    Standing Expiration Date:   12/09/2021   Vitamin B12    Standing Status:   Future    Standing Expiration Date:   12/09/2021   All questions were answered. The patient knows to call the clinic with any problems questions or concerns.  Return of visit: 6 months.   ZEarlie Server MD, PhD Hematology  Oncology [facility]id[facility] Hospitalat ALake West HospitalPager- [phone]/13/2022

## 2021-01-13 ENCOUNTER — Telehealth: Payer: Self-pay

## 2021-02-01 ENCOUNTER — Telehealth: Payer: Self-pay

## 2021-02-01 DIAGNOSIS — I495 Sick sinus syndrome: Secondary | ICD-10-CM | POA: Diagnosis not present

## 2021-02-01 NOTE — Progress Notes (Signed)
    Chronic Care Management Pharmacy Assistant   Name: Emma Campbell  MRN: [id number] DOB: Jun 27, 1932  Patient called to be reminded of her telephone appointment with [name], CPP on 12/07@ 1300  No answer, left message of appointment date, time and type of appointment (either telephone or in person). Left message to have all medications, supplements, blood pressure and/or blood sugar logs available during appointment and to return call if need to reschedule.  Star Rating Drug: Atorvastatin 80 mg last filled on 11/10/2020 for a 90-Day supply no pharmacy noted Metformin 500 mg last filled on 09/24/2020 for a 90-Day supply no pharmacy noted Amlodipine-Valsartan 5-160 mg last filled on 11/01/2020 for a 90-Day supply no pharmacy noted  Any gaps in medications fill history? Yes  Care Gaps: Zoster Vaccine  [name], CPA/CMA Clinical Pharmacist Assistant Phone: [phone]

## 2021-02-02 ENCOUNTER — Telehealth: Payer: Medicare HMO

## 2021-02-02 NOTE — Progress Notes (Incomplete)
Chronic Care Management Pharmacy Note  02/02/2021 Name:  Emma Campbell MRN:  [id number] DOB:  1932-10-15  Summary: Patient presents for follow-up today. She is in good spirits overall, although she was worried about some of her blood pressure readings at home. Her blood sugars have been well controlled recently.  Recommendations/Changes made from today's visit: Continue current medications  Plan: CPP follow-up in 6 months  Subjective: Emma Campbell is an 87 y.o. year old female who is a primary patient of Steele Sizer, MD.  The CCM team was consulted for assistance with disease management and care coordination needs.    Engaged with patient by telephone for follow up visit in response to provider referral for pharmacy case management and/or care coordination services.   Consent to Services:  The patient was given information about Chronic Care Management services, agreed to services, and gave verbal consent prior to initiation of services.  Please see initial visit note for detailed documentation.   Patient Care Team: Steele Sizer, MD as PCP - General (Family Medicine) Yolonda Kida, MD as Consulting Physician (Cardiology) Earlie Server, MD as Consulting Physician (Oncology) Germaine Pomfret, [facility] as Pharmacist (Pharmacist)  Recent office visits: 04/20/20: Patient presented to Dr. [name] for follow-up. Atorvastatin increased to 80 mg daily.   Recent consult visits: 06/07/20: Patient presented to Dr. Tasia Catchings (Oncology) 04/22/20: Patient presented to Gladstone Pih, NP (Cardiology) for follow-up. No medication changes made.   Hospital visits: None in previous 6 months   Objective:  Lab Results  Component Value Date   CREATININE 1.02 (H) 12/08/2020   BUN 17 12/08/2020   GFRNONAA 54 (L) 12/08/2020   GFRAA 92 08/20/2020   NA 137 12/08/2020   K 3.9 12/08/2020   CALCIUM 8.7 (L) 12/08/2020   CO2 29 12/08/2020   GLUCOSE 126 (H) 12/08/2020    Lab Results  Component Value  Date/Time   HGBA1C 5.6 11/18/2020 10:03 AM   HGBA1C 6.5 (A) 08/16/2020 01:08 PM   HGBA1C 5.9 (H) 08/12/2019 10:00 AM   HGBA1C 6.4 04/19/2018 10:27 AM   HGBA1C 6.4 04/19/2018 10:27 AM   HGBA1C 6.4 04/19/2018 10:27 AM   HGBA1C 6.1 12/17/2017 11:11 AM   HGBA1C 6.4 08/15/2017 10:23 AM   MICROALBUR 1.5 08/20/2020 08:33 AM   MICROALBUR 1.1 08/12/2019 10:00 AM   MICROALBUR 20 01/08/2019 12:05 PM   MICROALBUR 20 08/15/2017 10:23 AM    Last diabetic Eye exam:  Lab Results  Component Value Date/Time   HMDIABEYEEXA No Retinopathy 09/24/2020 12:00 AM    Last diabetic Foot exam: No results found for: HMDIABFOOTEX   Lab Results  Component Value Date   CHOL 150 08/20/2020   HDL 66 08/20/2020   LDLCALC 70 08/20/2020   TRIG 61 08/20/2020   CHOLHDL 2.3 08/20/2020    Hepatic Function Latest Ref Rng & Units 12/08/2020 08/20/2020 06/07/2020  Total Protein 6.5 - 8.1 g/dL 7.0 6.5 7.2  Albumin 3.5 - 5.0 g/dL 3.8 - 4.0  AST 15 - 41 U/L _0 ALT 0 - 44 U/L _1 Alk Phosphatase 38 - 126 U/L 57 - 51  Total Bilirubin 0.3 - 1.2 mg/dL 0.3 0.5 0.7    Lab Results  Component Value Date/Time   TSH 4.23 08/20/2020 08:33 AM   TSH 3.54 08/12/2019 10:00 AM    CBC Latest Ref Rng & Units 12/08/2020 06/07/2020 12/12/2019  WBC 4.0 - 10.5 K/uL 4.4 3.5(L) 3.9(L)  Hemoglobin 12.0 - 15.0 g/dL 11.7(L)  11.4(L) 11.7(L)  Hematocrit 36.0 - 46.0 % 36.1 35.1(L) 35.9(L)  Platelets 150 - 400 K/uL 217 195 246    Lab Results  Component Value Date/Time   VD25OH 41 08/12/2019 10:00 AM   VD25OH 36 12/17/2017 11:21 AM    Clinical ASCVD: No  The ASCVD Risk score (Arnett DK, et al., 2019) failed to calculate for the following reasons:   The 2019 ASCVD risk score is only valid for ages 29 to 28    Depression screen PHQ 2/9 11/18/2020 09/21/2020 08/16/2020  Decreased Interest 0 0 0  Down, Depressed, Hopeless 0 0 0  PHQ - 2 Score 0 0 0  Altered sleeping - - -  Tired, decreased energy - - -  Change in appetite -  - -  Feeling bad or failure about yourself  - - -  Trouble concentrating - - -  Moving slowly or fidgety/restless - - -  Suicidal thoughts - - -  PHQ-9 Score - - -  Difficult doing work/chores - - -  Some recent data might be hidden    Social History   Tobacco Use  Smoking Status Never  Smokeless Tobacco Never  Tobacco Comments   smoking cessation materials not required   BP Readings from Last 3 Encounters:  12/09/20 (!) 142/71  11/18/20 116/68  08/16/20 126/62   Pulse Readings from Last 3 Encounters:  12/09/20 80  11/18/20 81  08/16/20 82   Wt Readings from Last 3 Encounters:  12/09/20 108 lb 3.2 oz (49.1 kg)  11/18/20 106 lb 8 oz (48.3 kg)  08/16/20 109 lb (49.4 kg)   BMI Readings from Last 3 Encounters:  12/09/20 20.44 kg/m  11/18/20 20.12 kg/m  08/16/20 19.94 kg/m    Assessment/Interventions: Review of patient past medical history, allergies, medications, health status, including review of consultants reports, laboratory and other test data, was performed as part of comprehensive evaluation and provision of chronic care management services.   SDOH:  (Social Determinants of Health) assessments and interventions performed: Yes   SDOH Screenings   Alcohol Screen: Low Risk    Last Alcohol Screening Score (AUDIT): 0  Depression (PHQ2-9): Low Risk    PHQ-2 Score: 0  Financial Resource Strain: Low Risk    Difficulty of Paying Living Expenses: Not hard at all  Food Insecurity: No Food Insecurity   Worried About Charity fundraiser in the Last Year: Never true   Ran Out of Food in the Last Year: Never true  Housing: Low Risk    Last Housing Risk Score: 0  Physical Activity: Sufficiently Active   Days of Exercise per Week: 7 days   Minutes of Exercise per Session: 30 min  Social Connections: Moderately Isolated   Frequency of Communication with Friends and Family: More than three times a week   Frequency of Social Gatherings with Friends and Family: More than  three times a week   Attends Religious Services: More than 4 times per year   Active Member of [employer] or Organizations: No   Attends Archivist Meetings: Never   Marital Status: Widowed  Stress: No Stress Concern Present   Feeling of Stress : Not at all  Tobacco Use: Low Risk    Smoking Tobacco Use: Never   Smokeless Tobacco Use: Never   Passive Exposure: Not on file  Transportation Needs: No Transportation Needs   Lack of Transportation (Medical): No   Lack of Transportation (Non-Medical): No    CCM Care Plan  Allergies  Allergen Reactions   Ace Inhibitors Other (See Comments)    Pt unable to recall. Was advised to avoid    Medications Reviewed Today     Reviewed by Randie Heinz, [place] (Certified Medical Assistant) on 12/09/20 at 1008  Med List Status: <None>   Medication Order Taking? Sig Documenting Provider Last Dose Status Informant  ACCU-CHEK GUIDE test strip [id number] Yes USE  AS  INSTRUCTED  DAILY Steele Sizer, MD Taking Active   Accu-Chek Softclix Lancets lancets [id number] Yes USE AS DIRECTED EVERY Eloise Harman, MD Taking Active   Alcohol Swabs (B-D SINGLE USE SWABS REGULAR) PADS [id number] Yes USE AS DIRECTED THREE TIMES DAILY AS NEEDED Steele Sizer, MD Taking Active   amiodarone (PACERONE) 200 MG tablet [id number] Yes Take 100 mg by mouth daily. Yolonda Kida, MD Taking Active            Med Note Kai Levins Sep 22, 2015 12:11 PM) Taking half tablet  amLODipine-valsartan (EXFORGE) 5-160 MG tablet [id number] Yes Take 1 tablet by mouth daily. Steele Sizer, MD Taking Active   aspirin 81 MG tablet [id number] Yes Take 81 mg by mouth daily. [provider] Taking Active   atorvastatin (LIPITOR) 80 MG tablet [id number] Yes TAKE 1 TABLET EVERY DAY Sowles, Drue Stager, MD Taking Active   Blood Glucose Monitoring Suppl (ACCU-CHEK GUIDE ME) w/Device KIT [id number] Yes 1 each by Does not apply route daily. Steele Sizer, MD  Taking Active   cholecalciferol (VITAMIN D) 1000 UNITS tablet [id number] Yes Take 1,000 Units by mouth daily.  [provider] Taking Active   Cyanocobalamin (B-12 PO) [id number] Yes Take by mouth. [provider] Taking Active   ferrous sulfate 325 (65 FE) MG tablet [id number] Yes Take 325 mg by mouth daily with breakfast. [provider] Taking Active   folic acid (FOLVITE) 681 MCG tablet [id number] Yes Take 400 mcg by mouth daily. [provider] Taking Active   hydrochlorothiazide (HYDRODIURIL) 12.5 MG tablet [id number] Yes Take 1 tablet (12.5 mg total) by mouth daily. Steele Sizer, MD Taking Active            Med Note Gloris Ham, [place] [employer] [facility]   Tue Sep 21, 2020 11:39 AM) Pt taking 3 times weekly; tues, thurs and sat  metFORMIN (GLUCOPHAGE-XR) 500 MG 24 hr tablet [id number] No TAKE 1 TABLET EVERY EVENING  Patient not taking: Reported on 12/09/2020   Steele Sizer, MD Not Taking Active   Multiple Vitamins-Minerals (CENTRUM SILVER 50+WOMEN PO) [id number] Yes Take 1 tablet by mouth daily. [provider] Taking Active   [facility] injection [id number] Yes  [provider] Taking Active             Patient Active Problem List   Diagnosis Date Noted   B12 deficiency 05/21/2017   Gastrointestinal tract imaging abnormality    Iron deficiency anemia    Polyp of sigmoid colon    Arthritis, degenerative 05/03/2015   Menopausal and perimenopausal disorder 05/03/2015   Chronic kidney disease (CKD), stage III (moderate) (HCC) 11/30/2014   Diverticulosis 11/30/2014   Fibrocystic breast disease 11/30/2014   Elevated LFTs 30/08/6224   Systolic ejection murmur 33/35/4562   Paroxysmal A-fib (HCC) 11/30/2014   Microalbuminuria 07/31/2014   Elevated TSH 07/31/2014   History of cardiac pacemaker 07/31/2014   Hypercholesteremia 08/13/2013   Hypertension, benign 08/13/2013   Diabetes mellitus with renal manifestations, controlled ([place]) 08/13/2013    Avitaminosis D 04/12/2009    Immunization History  Administered Date(s) Administered  Fluad Quad(high Dose 65+) 11/19/2018, 12/15/2019   Influenza, High Dose Seasonal PF 11/30/2014, 12/09/2015, 12/11/2016, 12/17/2017, 11/18/2020   Influenza-Unspecified 10/28/2013   PFIZER Comirnaty(Gray Top)Covid-19 Tri-Sucrose Vaccine 09/17/2020   PFIZER(Purple Top)SARS-COV-2 Vaccination 04/03/2019, 04/24/2019, 11/27/2019   Pneumococcal Conjugate-13 07/31/2014   Pneumococcal Polysaccharide-23 08/11/2009   Tdap 08/11/2009, 10/16/2016   Zoster Recombinat (Shingrix) 08/18/2020   Zoster, Live 12/19/2011    Conditions to be addressed/monitored:  Hypertension, Hyperlipidemia, Diabetes, Atrial Fibrillation, and Chronic Kidney Disease  There are no care plans that you recently modified to display for this patient.    Medication Assistance: None required.  Patient affirms current coverage meets needs.  Compliance/Adherence/Medication fill history: Care Gaps: Covid Booster  Star-Rating Drugs: 05/04/2020 Atorvastatin 80 mg last filled for a 90-Day supply pharmacy not listed 04/08/2020 Metformin 500 mg last filled 04/08/2020 for a 90-day supply with [facility].  Patient's preferred pharmacy is:  [facility] [address] [place] [address] [place]) [place] [address] Phone: [phone] Fax: [place] [phone], [place] - [place] [address] Phone: [phone] Fax: [phone]  [place], [place] [address] Phone: [phone] Fax: [phone]  Uses pill box? Yes Pt endorses 100% compliance  We discussed: Current pharmacy is preferred with insurance plan and patient is satisfied with pharmacy services Patient decided to: Continue current medication management strategy  Care Plan  and Follow Up Patient Decision:  Patient agrees to Care Plan and Follow-up.  Plan: Telephone follow up appointment with care management team member scheduled for:  01/13/2021 at 1:00 PM  Doristine Section, [facility] [phone]  Current Barriers:  No barriers noted  Pharmacist Clinical Goal(s):  Patient will maintain control of blood pressure as evidenced by BP less than 140/90  maintain control of diabetes as evidenced by A1c less than 8%  through collaboration with PharmD and provider.   Interventions: 1:1 collaboration with Steele Sizer, MD regarding development and update of comprehensive plan of care as evidenced by provider attestation and co-signature Inter-disciplinary care team collaboration (see longitudinal plan of care) Comprehensive medication review performed; medication list updated in electronic medical record  Hypertension (BP goal <140/90) -Controlled -Current treatment: Amlodipine-Valsartan 5-160 mg daily  HCTZ 12.5 mg daily  -Medications previously tried: NA  -Current home readings: -Denies hypotensive/hypertensive symptoms -Counseled to monitor BP at home weekly, document, and provide log at future appointments -Recommended to continue current medication  Atrial Fibrillation (Goal: prevent stroke and major bleeding) -Controlled -CHADSVASC: 5 -Current treatment: Rate control: Amiodarone 200 mg 1/2 tablet daily  Anticoagulation: Not on anticoagulation has stayed in sinus rhythm continuously. -Medications previously tried: NA -Recommended to continue current medication  Hyperlipidemia: (LDL goal < 100) -Controlled -Current treatment: Atorvastatin 80 mg daily  -Medications previously tried: NA  -Recommended to continue current medication  Diabetes (A1c goal <8%) -Controlled -Current medications: Metformin XR 500 mg daily  -Medications previously tried: NA  -Current home glucose readings fasting  glucose: 120, 125, 128, 109  -Denies hypoglycemic/hyperglycemic symptoms -Counseled to check feet daily and get yearly eye exams -Recommended to continue current medication  Patient Goals/Self-Care Activities Patient will:  - check glucose daily before breakfast, document, and provide at future appointments check blood pressure weekly, document, and provide at future appointments  Follow Up Plan: ***

## 2021-02-14 ENCOUNTER — Other Ambulatory Visit: Payer: Self-pay | Admitting: Family Medicine

## 2021-02-14 DIAGNOSIS — I10 Essential (primary) hypertension: Secondary | ICD-10-CM

## 2021-02-14 DIAGNOSIS — E1122 Type 2 diabetes mellitus with diabetic chronic kidney disease: Secondary | ICD-10-CM

## 2021-03-18 NOTE — Progress Notes (Signed)
Name: Emma Campbell   MRN: [id number]    DOB: [birth year]   Date:03/21/2021       Progress Note  Subjective  Chief Complaint  Follow Up  HPI  DMII with renal manifestation: CKI and microalbuminuria. Diagnosed many years ago. A1C has been well controlled, it was 6.1 %  last visit 6.5 % now is down to 5.9 %, today is up back at 6 % , she has been off Metformin and still doing well.  Glucose at home is getting checked a couple of times weekly and controlled usually in the 120's range, very seldom goes up to 130's. She denies polyphagia, polydipsia or polyuria - only has frequency right after she takes HCTZ. She denies hypoglycemic episodes.Continue ARB for kidney protection and also for HTN . Eye exam is up to date. She is on statin therapy for dyslipidemia and last LDL at goal .   CKI stage III: she is on ARB ( [employer] ) bp has been well controlled, DM also controlled , no pruritis and she has normal urine output    HTN: taking medication daily and bp is at goal, no dizziness, chest pain or palpitation.  She tried to stop HCTZ but caused some swelling but controlled with taking it a few times a week, she did not take it today, advised to check bp when sh is on HCTZ . BP today is at goal.    Hyperlipidemia: she has been on Atorvastatin last LDL was 70 ( at goal ) she is on to 80 mg daily .   Sick Sinus Syndrome: she had a pacemaker placed in 2006 and has been on Amiodarone since 2006, she is now down to 100 mg of Amiodarone daily, last TSH still normal.  No chest pain, dizziness  or palpitation. Sees Dr. Clayborn Bigness , she is [name] score of 2, and is on aspirin only per his recommendation.  She also has a history of Afib that has been rate controlled. She also has a history of AVM's and not good candidate for blood thinners.   Malnutrition: unable to gain weight since COVID-19 in 2020 , her baseline weight prior to getting sick in March 2020 was in the mid 120's and is down to low 100's. She finally gained  a few pounds since last visit    Vitamin D deficiency: still taking supplementation, denies fatigue, last Vitamin D was normal  . Unchanged    Osteopenia: last FRAX 09/2019 showed risk of major fracture was 4.6 % and hip was 1.1 % , on vitamin D only. Recheck in 2 years .    Anemia: iron deficiency and positive hemoccult, seen by Dr. Durwin Reges and had EGD and colonoscopy September 2018,diagnosed with AVM small bowel and had it cauterized, hemoglobin dropped and was referred to Dr. Tasia Catchings,. Reviewed labs done in October with patient   Patient Active Problem List   Diagnosis Date Noted   B12 deficiency 05/21/2017   Gastrointestinal tract imaging abnormality    Iron deficiency anemia    Polyp of sigmoid colon    Arthritis, degenerative 05/03/2015   Menopausal and perimenopausal disorder 05/03/2015   Chronic kidney disease (CKD), stage III (moderate) ([place]) 11/30/2014   Diverticulosis 11/30/2014   Fibrocystic breast disease 11/30/2014   Elevated LFTs 15/17/6160   Systolic ejection murmur 73/71/0626   Paroxysmal A-fib (HCC) 11/30/2014   Microalbuminuria 07/31/2014   Elevated TSH 07/31/2014   History of cardiac pacemaker 07/31/2014   Hypercholesteremia 08/13/2013   Hypertension, benign 08/13/2013  Diabetes mellitus with renal manifestations, controlled ([place]) 08/13/2013   Avitaminosis D 04/12/2009    Past Surgical History:  Procedure Laterality Date   CATARACT EXTRACTION W/PHACO Right 01/07/2019   Procedure: CATARACT EXTRACTION PHACO AND INTRAOCULAR LENS PLACEMENT (IOC) RIGHT DIABETIC 02:15.1          25.7%        34.69;  Surgeon: Birder Robson, MD;  Location: [place];  Service: Ophthalmology;  Laterality: Right;  Diabetic - oral meds   CATARACT EXTRACTION W/PHACO Left 01/28/2019   Procedure: CATARACT EXTRACTION PHACO AND INTRAOCULAR LENS PLACEMENT (IOC) LEFT DIABETIC 12.96  01:15.5;  Surgeon: Birder Robson, MD;  Location: [place];  Service: Ophthalmology;   Laterality: Left;  Diabetic - oral meds   COLONOSCOPY WITH PROPOFOL N/A 11/06/2016   Procedure: COLONOSCOPY WITH PROPOFOL;  Surgeon: Lucilla Lame, MD;  Location: [place];  Service: Endoscopy;  Laterality: N/A;  DIABETIC   ESOPHAGOGASTRODUODENOSCOPY (EGD) WITH PROPOFOL N/A 11/06/2016   Procedure: ESOPHAGOGASTRODUODENOSCOPY (EGD) WITH PROPOFOL;  Surgeon: Lucilla Lame, MD;  Location: [place];  Service: Endoscopy;  Laterality: N/A;   ESOPHAGOGASTRODUODENOSCOPY (EGD) WITH PROPOFOL N/A 01/23/2017   Procedure: ESOPHAGOGASTRODUODENOSCOPY (EGD) WITH PROPOFOL with PUSH;  Surgeon: Lucilla Lame, MD;  Location: [facility] ENDOSCOPY;  Service: Endoscopy;  Laterality: N/A;   GIVENS CAPSULE STUDY N/A 12/22/2016   Procedure: GIVENS CAPSULE STUDY;  Surgeon: Lucilla Lame, MD;  Location: [facility];  Service: Endoscopy;  Laterality: N/A;   INSERT / REPLACE / REMOVE PACEMAKER     PACEMAKER INSERTION  2006   POLYPECTOMY  11/06/2016   Procedure: POLYPECTOMY;  Surgeon: Lucilla Lame, MD;  Location: [place];  Service: Endoscopy;;    Family History  Problem Relation Age of Onset   Hypertension Mother    Stroke Mother    Diabetes Daughter    Stroke Father    Healthy Sister    Hypertension Maternal Grandmother     Social History   Tobacco Use   Smoking status: Never   Smokeless tobacco: Never   Tobacco comments:    smoking cessation materials not required  Substance Use Topics   Alcohol use: No    Alcohol/week: 0.0 standard drinks     Current Outpatient Medications:    ACCU-CHEK GUIDE test strip, USE  AS  INSTRUCTED  DAILY, Disp: 100 strip, Rfl: 2   Accu-Chek Softclix Lancets lancets, USE AS DIRECTED EVERY DAY, Disp: 100 each, Rfl: 2   Alcohol Swabs (B-D SINGLE USE SWABS REGULAR) PADS, USE AS DIRECTED THREE TIMES DAILY AS NEEDED, Disp: 100 each, Rfl: 3   amiodarone (PACERONE) 200 MG tablet, Take 100 mg by mouth daily., Disp: , Rfl:    amLODipine-valsartan (EXFORGE) 5-160  MG tablet, TAKE 1 TABLET EVERY DAY, Disp: 90 tablet, Rfl: 1   aspirin 81 MG tablet, Take 81 mg by mouth daily., Disp: , Rfl:    atorvastatin (LIPITOR) 80 MG tablet, TAKE 1 TABLET EVERY DAY, Disp: 90 tablet, Rfl: 1   Blood Glucose Monitoring Suppl (ACCU-CHEK GUIDE ME) w/Device KIT, 1 each by Does not apply route daily., Disp: 1 kit, Rfl: 0   cholecalciferol (VITAMIN D) 1000 UNITS tablet, Take 1,000 Units by mouth daily. , Disp: , Rfl:    Cyanocobalamin (B-12 PO), Take by mouth., Disp: , Rfl:    ferrous sulfate 325 (65 FE) MG tablet, Take 325 mg by mouth daily with breakfast., Disp: , Rfl:    folic acid (FOLVITE) 893 MCG tablet, Take 400 mcg by mouth daily.,  Disp: , Rfl:    hydrochlorothiazide (HYDRODIURIL) 12.5 MG tablet, Take 1 tablet (12.5 mg total) by mouth daily., Disp: 90 tablet, Rfl: 1   metFORMIN (GLUCOPHAGE-XR) 500 MG 24 hr tablet, TAKE 1 TABLET EVERY EVENING, Disp: 90 tablet, Rfl: 1   Multiple Vitamins-Minerals (CENTRUM SILVER 50+WOMEN PO), Take 1 tablet by mouth daily., Disp: , Rfl:    SHINGRIX injection, , Disp: , Rfl:   Allergies  Allergen Reactions   Ace Inhibitors Other (See Comments)    Pt unable to recall. Was advised to avoid    I personally reviewed active problem list, medication list, allergies, family history, social history, health maintenance with the patient/caregiver today.   ROS  Constitutional: Negative for fever or weight change.  Respiratory: Negative for cough and shortness of breath.   Cardiovascular: Negative for chest pain or palpitations.  Gastrointestinal: Negative for abdominal pain, no bowel changes.  Musculoskeletal: Negative for gait problem or joint swelling.  Skin: Negative for rash.  Neurological: Negative for dizziness or headache.  No other specific complaints in a complete review of systems (except as listed in HPI above).   Objective  Vitals:   03/21/21 0937  BP: 122/70  Pulse: 83  Resp: 16  Temp: 97.9 F (36.6 C)  SpO2: 99%   Weight: 108 lb (49 kg)  Height: 5' 1"  (1.549 m)    Body mass index is 20.41 kg/m.  Physical Exam  Constitutional: Patient appears well-developed and malnourished. No distress.  HEENT: head atraumatic, normocephalic, pupils equal and reactive to light, neck supple, throat within normal limits Cardiovascular: Normal rate, regular rhythm and normal heart sounds.  2 plus  murmur heard. No BLE edema. Pulmonary/Chest: Effort normal and breath sounds normal. No respiratory distress. Abdominal: Soft.  There is no tenderness. Psychiatric: Patient has a normal mood and affect. behavior is normal. Judgment and thought content normal.    PHQ2/9: Depression screen [facility] 2/9 03/21/2021 11/18/2020 09/21/2020 08/16/2020 04/20/2020  Decreased Interest 0 0 0 0 0  Down, Depressed, Hopeless 0 0 0 0 0  PHQ - 2 Score 0 0 0 0 0  Altered sleeping 0 - - - -  Tired, decreased energy 0 - - - -  Change in appetite 0 - - - -  Feeling bad or failure about yourself  0 - - - -  Trouble concentrating 0 - - - -  Moving slowly or fidgety/restless 0 - - - -  Suicidal thoughts 0 - - - -  PHQ-9 Score 0 - - - -  Difficult doing work/chores - - - - -  Some recent data might be hidden    phq 9 is negative   Fall Risk: Fall Risk  03/21/2021 11/18/2020 09/21/2020 08/16/2020 04/20/2020  Falls in the past year? 0 0 0 0 0  Number falls in past yr: 0 - 0 0 0  Injury with Fall? 0 - 0 0 0  Comment - - - - -  Risk for fall due to : No Fall Risks - No Fall Risks - -  Risk for fall due to: Comment - - - - -  Follow up Falls prevention discussed Falls prevention discussed Falls prevention discussed - -      Functional Status Survey: Is the patient deaf or have difficulty hearing?: No Does the patient have difficulty seeing, even when wearing glasses/contacts?: No Does the patient have difficulty concentrating, remembering, or making decisions?: No Does the patient have difficulty walking or climbing stairs?: No Does the  patient have difficulty dressing or bathing?: No Does the patient have difficulty doing errands alone such as visiting a doctor's office or shopping?: No    Assessment & Plan  1. Type 2 diabetes mellitus with stage 3 chronic kidney disease and hypertension (HCC)  - POCT HgB A1C  2. Sick sinus syndrome (HCC)   3. Paroxysmal A-fib (HCC)  Rate controlled  4. Dyslipidemia associated with type 2 diabetes mellitus (HCC)  - atorvastatin (LIPITOR) 80 MG tablet; Take 1 tablet (80 mg total) by mouth daily.  Dispense: 90 tablet; Refill: 1  5. B12 deficiency   6. Mild protein-calorie malnutrition ([place])  Gained a few pounds, hopefully she will gain more prior to next visit   7. Vitamin D deficiency   8. Pacemaker   9. Osteopenia of multiple sites   10. Iron deficiency anemia due to chronic blood loss  Improved  11. Dyslipidemia  - atorvastatin (LIPITOR) 80 MG tablet; Take 1 tablet (80 mg total) by mouth daily.  Dispense: 90 tablet; Refill: 1

## 2021-03-21 ENCOUNTER — Other Ambulatory Visit: Payer: Self-pay

## 2021-03-21 ENCOUNTER — Ambulatory Visit (INDEPENDENT_AMBULATORY_CARE_PROVIDER_SITE_OTHER): Payer: Medicare HMO | Admitting: Family Medicine

## 2021-03-21 ENCOUNTER — Encounter: Payer: Self-pay | Admitting: Family Medicine

## 2021-03-21 VITALS — BP 122/70 | HR 83 | Temp 97.9°F | Resp 16 | Ht 61.0 in | Wt 108.0 lb

## 2021-03-21 DIAGNOSIS — E441 Mild protein-calorie malnutrition: Secondary | ICD-10-CM

## 2021-03-21 DIAGNOSIS — E1122 Type 2 diabetes mellitus with diabetic chronic kidney disease: Secondary | ICD-10-CM | POA: Diagnosis not present

## 2021-03-21 DIAGNOSIS — D500 Iron deficiency anemia secondary to blood loss (chronic): Secondary | ICD-10-CM

## 2021-03-21 DIAGNOSIS — E538 Deficiency of other specified B group vitamins: Secondary | ICD-10-CM | POA: Diagnosis not present

## 2021-03-21 DIAGNOSIS — E559 Vitamin D deficiency, unspecified: Secondary | ICD-10-CM

## 2021-03-21 DIAGNOSIS — I129 Hypertensive chronic kidney disease with stage 1 through stage 4 chronic kidney disease, or unspecified chronic kidney disease: Secondary | ICD-10-CM

## 2021-03-21 DIAGNOSIS — M8589 Other specified disorders of bone density and structure, multiple sites: Secondary | ICD-10-CM

## 2021-03-21 DIAGNOSIS — I495 Sick sinus syndrome: Secondary | ICD-10-CM

## 2021-03-21 DIAGNOSIS — E1169 Type 2 diabetes mellitus with other specified complication: Secondary | ICD-10-CM

## 2021-03-21 DIAGNOSIS — N183 Chronic kidney disease, stage 3 unspecified: Secondary | ICD-10-CM | POA: Diagnosis not present

## 2021-03-21 DIAGNOSIS — I480 Paroxysmal atrial fibrillation: Secondary | ICD-10-CM | POA: Diagnosis not present

## 2021-03-21 DIAGNOSIS — E785 Hyperlipidemia, unspecified: Secondary | ICD-10-CM

## 2021-03-21 DIAGNOSIS — Z950 Presence of cardiac pacemaker: Secondary | ICD-10-CM | POA: Diagnosis not present

## 2021-03-21 LAB — POCT GLYCOSYLATED HEMOGLOBIN (HGB A1C): Hemoglobin A1C: 6 % — AB (ref 4.0–5.6)

## 2021-03-21 MED ORDER — ATORVASTATIN CALCIUM 80 MG PO TABS: 80.0000 mg | ORAL_TABLET | Freq: Every day | ORAL | 1 refills | Status: DC

## 2021-04-01 ENCOUNTER — Other Ambulatory Visit: Payer: Self-pay | Admitting: Family Medicine

## 2021-04-13 ENCOUNTER — Other Ambulatory Visit: Payer: Self-pay

## 2021-04-13 ENCOUNTER — Telehealth: Payer: Self-pay | Admitting: Family Medicine

## 2021-04-13 DIAGNOSIS — I129 Hypertensive chronic kidney disease with stage 1 through stage 4 chronic kidney disease, or unspecified chronic kidney disease: Secondary | ICD-10-CM

## 2021-04-13 DIAGNOSIS — N183 Chronic kidney disease, stage 3 unspecified: Secondary | ICD-10-CM

## 2021-04-13 MED ORDER — ALCOHOL SWABS PADS: 1.0000 | MEDICATED_PAD | Freq: Three times a day (TID) | 3 refills | Status: DC

## 2021-04-13 NOTE — Telephone Encounter (Signed)
Patient called in about alcohol swabs, she says the nes she received she doesn't like and is asking for name BD alcohol swabs.

## 2021-04-13 NOTE — Telephone Encounter (Signed)
New order placed

## 2021-05-10 DIAGNOSIS — I495 Sick sinus syndrome: Secondary | ICD-10-CM | POA: Diagnosis not present

## 2021-05-16 ENCOUNTER — Telehealth: Payer: Self-pay

## 2021-05-16 NOTE — Progress Notes (Signed)
? ? ?Chronic Care Management ?Pharmacy Assistant  ? ?Name: Emma Campbell  MRN: [id number] DOB: 10/08/1932 ? ?Reason for Encounter: Diabetes Disease State Call ? ?Recent office visits:  ?03/21/2021 Krichna Sowles, MD (PCP Office Visit) for Type II DM- Stopped: Metformin HCl 500 mg, lab order placed, patient to follow-up in 6 months. ? ?Recent consult visits:  ?12/09/2020 Zhou Yu, MD (Oncology) for Iron Deficiency- No medication changes noted, lab orders placed, patient to follow-up in 6 months ? ?Hospital visits:  ?None in previous 6 months ? ?Medications: ?Outpatient Encounter Medications as of 05/16/2021  ?Medication Sig Note  ? ACCU-CHEK GUIDE test strip USE  AS  INSTRUCTED  DAILY   ? Accu-Chek Softclix Lancets lancets USE AS DIRECTED EVERY DAY   ? Alcohol Swabs PADS 1 each by Does not apply route 3 (three) times daily.   ? amiodarone (PACERONE) 200 MG tablet Take 100 mg by mouth daily. 09/22/2015: Taking half tablet  ? amLODipine-valsartan (EXFORGE) 5-160 MG tablet TAKE 1 TABLET EVERY DAY   ? aspirin 81 MG tablet Take 81 mg by mouth daily.   ? atorvastatin (LIPITOR) 80 MG tablet Take 1 tablet (80 mg total) by mouth daily.   ? Blood Glucose Monitoring Suppl (ACCU-CHEK GUIDE ME) w/Device KIT 1 each by Does not apply route daily.   ? cholecalciferol (VITAMIN D) 1000 UNITS tablet Take 1,000 Units by mouth daily.    ? Cyanocobalamin (B-12 PO) Take by mouth.   ? ferrous sulfate 325 (65 FE) MG tablet Take 325 mg by mouth daily with breakfast.   ? folic acid (FOLVITE) 400 MCG tablet Take 400 mcg by mouth daily.   ? hydrochlorothiazide (HYDRODIURIL) 12.5 MG tablet Take 1 tablet (12.5 mg total) by mouth daily. 09/21/2020: Pt taking 3 times weekly; tues, thurs and sat  ? Multiple Vitamins-Minerals (CENTRUM SILVER 50+WOMEN PO) Take 1 tablet by mouth daily.   ? SHINGRIX injection    ? ?No facility-administered encounter medications on file as of 05/16/2021.  ? ?Care Gaps: ?None ID ? ?Star Rating Drugs: ?Atorvastatin 80 mg last  filled on 03/31/2021 for a 90-Day supply with [facility] ? ?Recent Relevant Labs: ?Lab Results  ?Component Value Date/Time  ? HGBA1C 6.0 (A) 03/21/2021 09:42 AM  ? HGBA1C 5.6 11/18/2020 10:03 AM  ? HGBA1C 5.9 (H) 08/12/2019 10:00 AM  ? HGBA1C 6.4 04/19/2018 10:27 AM  ? HGBA1C 6.4 04/19/2018 10:27 AM  ? HGBA1C 6.4 04/19/2018 10:27 AM  ? HGBA1C 6.1 12/17/2017 11:11 AM  ? HGBA1C 6.4 08/15/2017 10:23 AM  ? MICROALBUR 1.5 08/20/2020 08:33 AM  ? MICROALBUR 1.1 08/12/2019 10:00 AM  ? MICROALBUR 20 01/08/2019 12:05 PM  ? MICROALBUR 20 08/15/2017 10:23 AM  ?  ?Kidney Function ?Lab Results  ?Component Value Date/Time  ? CREATININE 1.02 (H) 12/08/2020 10:52 AM  ? CREATININE 0.68 08/20/2020 08:33 AM  ? CREATININE 0.93 06/07/2020 09:54 AM  ? CREATININE 0.94 (H) 08/12/2019 10:00 AM  ? GFRNONAA 54 (L) 12/08/2020 10:52 AM  ? GFRNONAA 79 08/20/2020 08:33 AM  ? GFRAA 92 08/20/2020 08:33 AM  ? ? ?Current antihyperglycemic regimen:  ?None ID ? ?What recent interventions/DTPs have been made to improve glycemic control:  ?Patient was recently discontinued from Metformin due to her blood sugar numbers being normal. Her A1C was rechecked and it did go from 5.6 to 6 but patient is doing well ? ?Have there been any recent hospitalizations or ED visits since last visit with CPP? No ? ?Patient denies hypoglycemic symptoms, including   Pale, Sweaty, Shaky, Hungry, Nervous/irritable, and Vision changes ? ?Patient denies hyperglycemic symptoms, including blurry vision, excessive thirst, fatigue, polyuria, and weakness ? ?How often are you checking your blood sugar? before meals a few times a week. Patient does not take her blood sugars daily ?What are your blood sugars ranging?  ?Fasting: Patient reports that her blood sugar 2 days ago was 121 ? ?During the week, how often does your blood glucose drop below 70? Never ?Are you checking your feet daily/regularly? Yes ? ?Adherence Review: ?Is the patient currently on a STATIN medication?  Yes ?Is the patient currently on ACE/ARB medication? Yes ?Does the patient have >5 day gap between last estimated fill dates? No ? ?Patient reports that she is doing well. Patient denies any ill symptoms at this time. She stated that she saw her PCP provider in January and she reports that she gained 2lbs. Patient stated that PCP wanted her to gain more weight, but she just doesn't gain weight quickly. Patient reports not taking the Metformin since December, but states PCP advised her that she can take it during holidays or when she knows she will be eating more than normal. Patient reports taking all other medications as directed with no problems, and denies needing any refills today. Patient has no other concerns or issues at this moment. Patient provided with my direct number of [phone] if she needs anything. ? ?Patient agreed to schedule a follow-up appointment with CPP on 06/29/2021 @ 1000. ? ?[name], CPA/CMA ?Clinical Pharmacist Assistant ?Phone: [phone]  ? ? ? ? ?

## 2021-06-08 ENCOUNTER — Inpatient Hospital Stay: Payer: Medicare HMO | Attending: Oncology

## 2021-06-08 DIAGNOSIS — Z8249 Family history of ischemic heart disease and other diseases of the circulatory system: Secondary | ICD-10-CM | POA: Insufficient documentation

## 2021-06-08 DIAGNOSIS — Z79899 Other long term (current) drug therapy: Secondary | ICD-10-CM | POA: Diagnosis not present

## 2021-06-08 DIAGNOSIS — N1831 Chronic kidney disease, stage 3a: Secondary | ICD-10-CM | POA: Diagnosis not present

## 2021-06-08 DIAGNOSIS — Z823 Family history of stroke: Secondary | ICD-10-CM | POA: Diagnosis not present

## 2021-06-08 DIAGNOSIS — D631 Anemia in chronic kidney disease: Secondary | ICD-10-CM | POA: Insufficient documentation

## 2021-06-08 DIAGNOSIS — Z833 Family history of diabetes mellitus: Secondary | ICD-10-CM | POA: Insufficient documentation

## 2021-06-08 DIAGNOSIS — E611 Iron deficiency: Secondary | ICD-10-CM | POA: Diagnosis not present

## 2021-06-08 DIAGNOSIS — D500 Iron deficiency anemia secondary to blood loss (chronic): Secondary | ICD-10-CM

## 2021-06-08 DIAGNOSIS — I129 Hypertensive chronic kidney disease with stage 1 through stage 4 chronic kidney disease, or unspecified chronic kidney disease: Secondary | ICD-10-CM | POA: Insufficient documentation

## 2021-06-08 DIAGNOSIS — E538 Deficiency of other specified B group vitamins: Secondary | ICD-10-CM | POA: Diagnosis not present

## 2021-06-08 LAB — CBC WITH DIFFERENTIAL/PLATELET
Abs Immature Granulocytes: 0 10*3/uL (ref 0.00–0.07)
Basophils Absolute: 0 10*3/uL (ref 0.0–0.1)
Basophils Relative: 1 %
Eosinophils Absolute: 0.1 10*3/uL (ref 0.0–0.5)
Eosinophils Relative: 3 %
HCT: 36.1 % (ref 36.0–46.0)
Hemoglobin: 11.7 g/dL — ABNORMAL LOW (ref 12.0–15.0)
Immature Granulocytes: 0 %
Lymphocytes Relative: 45 %
Lymphs Abs: 1.7 10*3/uL (ref 0.7–4.0)
MCH: 29.5 pg (ref 26.0–34.0)
MCHC: 32.4 g/dL (ref 30.0–36.0)
MCV: 91.2 fL (ref 80.0–100.0)
Monocytes Absolute: 0.4 10*3/uL (ref 0.1–1.0)
Monocytes Relative: 10 %
Neutro Abs: 1.5 10*3/uL — ABNORMAL LOW (ref 1.7–7.7)
Neutrophils Relative %: 41 %
Platelets: 179 10*3/uL (ref 150–400)
RBC: 3.96 MIL/uL (ref 3.87–5.11)
RDW: 12.9 % (ref 11.5–15.5)
WBC: 3.7 10*3/uL — ABNORMAL LOW (ref 4.0–10.5)
nRBC: 0 % (ref 0.0–0.2)

## 2021-06-08 LAB — IRON AND TIBC
Iron: 94 ug/dL (ref 28–170)
Saturation Ratios: 32 % — ABNORMAL HIGH (ref 10.4–31.8)
TIBC: 293 ug/dL (ref 250–450)
UIBC: 199 ug/dL

## 2021-06-08 LAB — VITAMIN B12: Vitamin B-12: 1112 pg/mL — ABNORMAL HIGH (ref 180–914)

## 2021-06-08 LAB — FERRITIN: Ferritin: 116 ng/mL (ref 11–307)

## 2021-06-10 ENCOUNTER — Encounter: Payer: Self-pay | Admitting: Oncology

## 2021-06-10 ENCOUNTER — Inpatient Hospital Stay: Payer: Medicare HMO | Admitting: Oncology

## 2021-06-10 VITALS — BP 133/63 | HR 75 | Temp 96.9°F | Resp 18 | Wt 110.6 lb

## 2021-06-10 DIAGNOSIS — D500 Iron deficiency anemia secondary to blood loss (chronic): Secondary | ICD-10-CM | POA: Diagnosis not present

## 2021-06-10 DIAGNOSIS — D631 Anemia in chronic kidney disease: Secondary | ICD-10-CM | POA: Diagnosis not present

## 2021-06-10 DIAGNOSIS — Z8249 Family history of ischemic heart disease and other diseases of the circulatory system: Secondary | ICD-10-CM | POA: Diagnosis not present

## 2021-06-10 DIAGNOSIS — E538 Deficiency of other specified B group vitamins: Secondary | ICD-10-CM | POA: Diagnosis not present

## 2021-06-10 DIAGNOSIS — Z833 Family history of diabetes mellitus: Secondary | ICD-10-CM | POA: Diagnosis not present

## 2021-06-10 DIAGNOSIS — N1831 Chronic kidney disease, stage 3a: Secondary | ICD-10-CM | POA: Diagnosis not present

## 2021-06-10 DIAGNOSIS — I129 Hypertensive chronic kidney disease with stage 1 through stage 4 chronic kidney disease, or unspecified chronic kidney disease: Secondary | ICD-10-CM | POA: Diagnosis not present

## 2021-06-10 DIAGNOSIS — Z79899 Other long term (current) drug therapy: Secondary | ICD-10-CM | POA: Diagnosis not present

## 2021-06-10 DIAGNOSIS — Z823 Family history of stroke: Secondary | ICD-10-CM | POA: Diagnosis not present

## 2021-06-10 DIAGNOSIS — E611 Iron deficiency: Secondary | ICD-10-CM | POA: Diagnosis not present

## 2021-06-10 NOTE — Progress Notes (Signed)
Pt states no new concerns for today's visit.  

## 2021-06-10 NOTE — Progress Notes (Signed)
?Hematology/Oncology Progress note ?[phone] [phone] ?  ? ? ? ?Patient Care Team: ?Steele Sizer, MD as PCP - General (Family Medicine) ?Yolonda Kida, MD as Consulting Physician (Cardiology) ?Earlie Server, MD as Consulting Physician (Oncology) ?Germaine Pomfret, [facility] as Pharmacist (Pharmacist) ? ?REFERRING PROVIDER: ?Steele Sizer, MD ?REASON FOR VISIT ?Follow up for treatment of anemia ? ?HISTORY OF PRESENTING ILLNESS:  ?Emma Campbell is a  88 y.o.  female with PMH listed below who was referred to me for evaluation of anemia.  Patient follows up with primary care physician and has had labs done recently. 04/17/17 CBC showed hemoglobin 9, MCV 81.9, platelet 245,000, WBC 4.1, normal differential.  Iron panel showed TIBC 442, ferritin 11, saturation 15%. ?Previous GI workup:  ?# 01/23/2017 EGD showed non bleeding angiotecsia, treated with APC. 12/22/2016 Capsule study showed duodenum AVM.  ?11/06/2016 colonoscopy showed non bleeding hemorroids, sigmoid polyp, and diverticulosis.  ? ?INTERVAL HISTORY ?Emma Campbell is a 88 y.o. female who has above history reviewed by me today presents for follow up for management of iron deficiency anemia.  ?Patient takes oral iron supplementation.  She reports feeling well. ? ?Review of Systems  ?Constitutional:  Negative for chills, fever, malaise/fatigue and weight loss.  ?HENT:  Negative for sore throat.   ?Eyes:  Negative for redness.  ?Respiratory:  Negative for cough, shortness of breath and wheezing.   ?Cardiovascular:  Negative for chest pain, palpitations and leg swelling.  ?Gastrointestinal:  Negative for abdominal pain, blood in stool, nausea and vomiting.  ?Genitourinary:  Negative for dysuria.  ?Musculoskeletal:  Negative for myalgias.  ?Skin:  Negative for rash.  ?Neurological:  Negative for dizziness, tingling and tremors.  ?Endo/Heme/Allergies:  Does not bruise/bleed easily.  ?Psychiatric/Behavioral:  Negative for hallucinations.    ? ?MEDICAL HISTORY:  ?Past Medical History:  ?Diagnosis Date  ? Anemia   ? Chronic kidney disease, stage III (moderate) (HCC)   ? Diverticulosis   ? Elevated LFTs   ? Essential hypertension, malignant   ? Fibrocystic breast disease   ? Heart murmur   ? Hyperlipidemia   ? Menopausal problem   ? Osteoarthrosis   ? Osteoporosis   ? Peripheral autonomic neuropathy   ? Presence of permanent cardiac pacemaker   ? Sick sinus syndrome ([place])   ? Dr. Alfredo Batty  ? Type II diabetes mellitus with renal manifestations ([place])   ? Vitamin D deficiency   ? ? ?SURGICAL HISTORY: ?Past Surgical History:  ?Procedure Laterality Date  ? CATARACT EXTRACTION W/PHACO Right 01/07/2019  ? Procedure: CATARACT EXTRACTION PHACO AND INTRAOCULAR LENS PLACEMENT (IOC) RIGHT DIABETIC 02:15.1          25.7%        34.69;  Surgeon: Birder Robson, MD;  Location: [place];  Service: Ophthalmology;  Laterality: Right;  Diabetic - oral meds  ? CATARACT EXTRACTION W/PHACO Left 01/28/2019  ? Procedure: CATARACT EXTRACTION PHACO AND INTRAOCULAR LENS PLACEMENT (IOC) LEFT DIABETIC 12.96  01:15.5;  Surgeon: Birder Robson, MD;  Location: [place];  Service: Ophthalmology;  Laterality: Left;  Diabetic - oral meds  ? COLONOSCOPY WITH PROPOFOL N/A 11/06/2016  ? Procedure: COLONOSCOPY WITH PROPOFOL;  Surgeon: Lucilla Lame, MD;  Location: [place];  Service: Endoscopy;  Laterality: N/A;  DIABETIC  ? ESOPHAGOGASTRODUODENOSCOPY (EGD) WITH PROPOFOL N/A 11/06/2016  ? Procedure: ESOPHAGOGASTRODUODENOSCOPY (EGD) WITH PROPOFOL;  Surgeon: Lucilla Lame, MD;  Location: [place];  Service: Endoscopy;  Laterality: N/A;  ? ESOPHAGOGASTRODUODENOSCOPY (EGD) WITH PROPOFOL  N/A 01/23/2017  ? Procedure: ESOPHAGOGASTRODUODENOSCOPY (EGD) WITH PROPOFOL with PUSH;  Surgeon: Lucilla Lame, MD;  Location: [facility] ENDOSCOPY;  Service: Endoscopy;  Laterality: N/A;  ? GIVENS CAPSULE STUDY N/A 12/22/2016  ? Procedure: GIVENS CAPSULE STUDY;   Surgeon: Lucilla Lame, MD;  Location: [facility] ENDOSCOPY;  Service: Endoscopy;  Laterality: N/A;  ? INSERT / REPLACE / REMOVE PACEMAKER    ? PACEMAKER INSERTION  2006  ? POLYPECTOMY  11/06/2016  ? Procedure: POLYPECTOMY;  Surgeon: Lucilla Lame, MD;  Location: [place];  Service: Endoscopy;;  ? ? ?SOCIAL HISTORY: ?Social History  ? ?Socioeconomic History  ? Marital status: Widowed  ?  Spouse name: Shanon Brow  ? Number of children: 1  ? Years of education: 2 years of college  ? Highest education level: 12th grade  ?Occupational History  ? Occupation: Retired Agricultural engineer at [employer]  ?Tobacco Use  ? Smoking status: Never  ? Smokeless tobacco: Never  ? Tobacco comments:  ?  smoking cessation materials not required  ?Vaping Use  ? Vaping Use: Never used  ?Substance and Sexual Activity  ? Alcohol use: No  ?  Alcohol/week: 0.0 standard drinks  ? Drug use: No  ? Sexual activity: Never  ?Other Topics Concern  ? Not on file  ?Social History Narrative  ? She still has a house but her daughter and grand-daughter are staying at her house  ? She moved in with her older sister ( 44 years older), to help her out.   ? ?Social Determinants of Health  ? ?Financial Resource Strain: Low Risk   ? Difficulty of Paying Living Expenses: Not hard at all  ?Food Insecurity: No Food Insecurity  ? Worried About Charity fundraiser in the Last Year: Never true  ? Ran Out of Food in the Last Year: Never true  ?Transportation Needs: No Transportation Needs  ? Lack of Transportation (Medical): No  ? Lack of Transportation (Non-Medical): No  ?Physical Activity: Sufficiently Active  ? Days of Exercise per Week: 7 days  ? Minutes of Exercise per Session: 30 min  ?Stress: No Stress Concern Present  ? Feeling of Stress : Not at all  ?Social Connections: Moderately Isolated  ? Frequency of Communication with Friends and Family: More than three times a week  ? Frequency of Social Gatherings with Friends and Family: More than three times a week  ?  Attends Religious Services: More than 4 times per year  ? Active Member of Clubs or Organizations: No  ? Attends Archivist Meetings: Never  ? Marital Status: Widowed  ?Intimate Partner Violence: Not At Risk  ? Fear of Current or Ex-Partner: No  ? Emotionally Abused: No  ? Physically Abused: No  ? Sexually Abused: No  ? ? ?FAMILY HISTORY: ?Family History  ?Problem Relation Age of Onset  ? Hypertension Mother   ? Stroke Mother   ? Diabetes Daughter   ? Stroke Father   ? Healthy Sister   ? Hypertension Maternal Grandmother   ? ? ?ALLERGIES:  is allergic to ace inhibitors. ? ?MEDICATIONS:  ?Current Outpatient Medications  ?Medication Sig Dispense Refill  ? amiodarone (PACERONE) 200 MG tablet Take 100 mg by mouth daily.    ? amLODipine-valsartan (EXFORGE) 5-160 MG tablet TAKE 1 TABLET EVERY DAY 90 tablet 1  ? aspirin 81 MG tablet Take 81 mg by mouth daily.    ? atorvastatin (LIPITOR) 80 MG tablet Take 1 tablet (80 mg total) by mouth daily. 90 tablet  1  ? cholecalciferol (VITAMIN D) 1000 UNITS tablet Take 1,000 Units by mouth daily.     ? Cyanocobalamin (B-12 PO) Take by mouth.    ? ferrous sulfate 325 (65 FE) MG tablet Take 325 mg by mouth daily with breakfast.    ? folic acid (FOLVITE) 295 MCG tablet Take 400 mcg by mouth daily.    ? Multiple Vitamins-Minerals (CENTRUM SILVER 50+WOMEN PO) Take 1 tablet by mouth daily.    ? SHINGRIX injection     ? ACCU-CHEK GUIDE test strip USE  AS  INSTRUCTED  DAILY 100 strip 2  ? Accu-Chek Softclix Lancets lancets USE AS DIRECTED EVERY DAY 100 each 2  ? Alcohol Swabs PADS 1 each by Does not apply route 3 (three) times daily. 100 each 3  ? Blood Glucose Monitoring Suppl (ACCU-CHEK GUIDE ME) w/Device KIT 1 each by Does not apply route daily. 1 kit 0  ? hydrochlorothiazide (HYDRODIURIL) 12.5 MG tablet Take 1 tablet (12.5 mg total) by mouth daily. 90 tablet 1  ? ?No current facility-administered medications for this visit.  ? ? ? ?PHYSICAL EXAMINATION: ?ECOG PERFORMANCE STATUS:  1 - Symptomatic but completely ambulatory ?Vitals:  ? 06/10/21 1038  ?BP: 133/63  ?Pulse: 75  ?Resp: 18  ?Temp: (!) 96.9 ?F (36.1 ?C)  ?SpO2: 99%  ? ?Filed Weights  ? 06/10/21 1038  ?Weight: 110 lb 9.6 oz (50

## 2021-06-27 DIAGNOSIS — R011 Cardiac murmur, unspecified: Secondary | ICD-10-CM | POA: Diagnosis not present

## 2021-06-27 DIAGNOSIS — Z8679 Personal history of other diseases of the circulatory system: Secondary | ICD-10-CM | POA: Diagnosis not present

## 2021-06-27 DIAGNOSIS — I495 Sick sinus syndrome: Secondary | ICD-10-CM | POA: Diagnosis not present

## 2021-06-27 DIAGNOSIS — I10 Essential (primary) hypertension: Secondary | ICD-10-CM | POA: Diagnosis not present

## 2021-06-27 DIAGNOSIS — Z950 Presence of cardiac pacemaker: Secondary | ICD-10-CM | POA: Diagnosis not present

## 2021-06-28 ENCOUNTER — Telehealth: Payer: Self-pay

## 2021-06-28 NOTE — Progress Notes (Signed)
? ? ?  Chronic Care Management ?Pharmacy Assistant  ? ?Name: PAISLIE TESSLER  MRN: [id number] DOB: 1932/08/27 ? ?Patient called to be reminded of her telephone appointment with [name], CPP on 06/29/2021 @ 1000. ? ?Patient aware of appointment date, time, and type of appointment (either telephone or in person). Patient aware to have/bring all medications, supplements, blood pressure and/or blood sugar logs to visit. ? ?Star Rating Drug: ?Atorvastatin 80 mg last filled on 03/31/2021 for a 90-Day supply with [facility] ? ?Any gaps in medications fill history? No ? ?Care Gaps: ?None ID ? ?[name], CPA/CMA ?Clinical Pharmacist Assistant ?Phone: [phone]  ? ?

## 2021-06-29 ENCOUNTER — Ambulatory Visit (INDEPENDENT_AMBULATORY_CARE_PROVIDER_SITE_OTHER): Payer: Medicare HMO

## 2021-06-29 DIAGNOSIS — E1122 Type 2 diabetes mellitus with diabetic chronic kidney disease: Secondary | ICD-10-CM

## 2021-06-29 DIAGNOSIS — I10 Essential (primary) hypertension: Secondary | ICD-10-CM

## 2021-06-29 NOTE — Progress Notes (Signed)
? ?Chronic Care Management ?Pharmacy Note ? ?07/06/2021 ?Name:  Emma Campbell MRN:  [id number] DOB:  19-Jul-1932 ? ?Summary: ?Patient presents for follow-up today. She is in good spirits overall, although she was worried about some of her blood pressure readings at home. Her blood sugars have been well controlled recently. ? ?Recommendations/Changes made from today's visit: ?Continue current medications ? ?Plan: ?No further follow up required: Patient will reach out to pharmacy team as needed ? ?Subjective: ?Emma Campbell is an 88 y.o. year old female who is a primary patient of Steele Sizer, MD.  The CCM team was consulted for assistance with disease management and care coordination needs.   ? ?Engaged with patient by telephone for follow up visit in response to provider referral for pharmacy case management and/or care coordination services.  ? ?Consent to Services:  ?The patient was given information about Chronic Care Management services, agreed to services, and gave verbal consent prior to initiation of services.  Please see initial visit note for detailed documentation.  ? ?Patient Care Team: ?Steele Sizer, MD as PCP - General (Family Medicine) ?Yolonda Kida, MD as Consulting Physician (Cardiology) ?Earlie Server, MD as Consulting Physician (Oncology) ?Germaine Pomfret, [facility] as Pharmacist (Pharmacist) ? ?Recent office visits: ?04/20/20: Patient presented to Dr. [name] for follow-up. Atorvastatin increased to 80 mg daily.  ? ?Recent consult visits: ?06/07/20: Patient presented to Dr. Tasia Catchings (Oncology) ?04/22/20: Patient presented to Gladstone Pih, NP (Cardiology) for follow-up. No medication changes made.  ? ?Hospital visits: ?None in previous 6 months ? ? ?Objective: ? ?Lab Results  ?Component Value Date  ? CREATININE 1.02 (H) 12/08/2020  ? BUN 17 12/08/2020  ? GFRNONAA 54 (L) 12/08/2020  ? GFRAA 92 08/20/2020  ? NA 137 12/08/2020  ? K 3.9 12/08/2020  ? CALCIUM 8.7 (L) 12/08/2020  ? CO2 29 12/08/2020  ? GLUCOSE  126 (H) 12/08/2020  ? ? ?Lab Results  ?Component Value Date/Time  ? HGBA1C 6.0 (A) 03/21/2021 09:42 AM  ? HGBA1C 5.6 11/18/2020 10:03 AM  ? HGBA1C 5.9 (H) 08/12/2019 10:00 AM  ? HGBA1C 6.4 04/19/2018 10:27 AM  ? HGBA1C 6.4 04/19/2018 10:27 AM  ? HGBA1C 6.4 04/19/2018 10:27 AM  ? HGBA1C 6.1 12/17/2017 11:11 AM  ? HGBA1C 6.4 08/15/2017 10:23 AM  ? MICROALBUR 1.5 08/20/2020 08:33 AM  ? MICROALBUR 1.1 08/12/2019 10:00 AM  ? MICROALBUR 20 01/08/2019 12:05 PM  ? MICROALBUR 20 08/15/2017 10:23 AM  ?  ?Last diabetic Eye exam:  ?Lab Results  ?Component Value Date/Time  ? HMDIABEYEEXA No Retinopathy 09/24/2020 12:00 AM  ?  ?Last diabetic Foot exam: No results found for: HMDIABFOOTEX  ? ?Lab Results  ?Component Value Date  ? CHOL 150 08/20/2020  ? HDL 66 08/20/2020  ? Martin 70 08/20/2020  ? TRIG 61 08/20/2020  ? CHOLHDL 2.3 08/20/2020  ? ? ? ?  Latest Ref Rng & Units 12/08/2020  ? 10:52 AM 08/20/2020  ?  8:33 AM 06/07/2020  ?  9:54 AM  ?Hepatic Function  ?Total Protein 6.5 - 8.1 g/dL 7.0   6.5   7.2    ?Albumin 3.5 - 5.0 g/dL 3.8    4.0    ?AST 15 - 41 U/L 23   20   24     ?ALT 0 - 44 U/L 22   20   25     ?Alk Phosphatase 38 - 126 U/L 57    51    ?Total Bilirubin 0.3 - 1.2 mg/dL 0.3  0.5   0.7    ? ? ?Lab Results  ?Component Value Date/Time  ? TSH 4.23 08/20/2020 08:33 AM  ? TSH 3.54 08/12/2019 10:00 AM  ? ? ? ?  Latest Ref Rng & Units 06/08/2021  ?  1:37 PM 12/08/2020  ? 10:52 AM 06/07/2020  ?  9:54 AM  ?CBC  ?WBC 4.0 - 10.5 K/uL 3.7   4.4   3.5    ?Hemoglobin 12.0 - 15.0 g/dL 11.7   11.7   11.4    ?Hematocrit 36.0 - 46.0 % 36.1   36.1   35.1    ?Platelets 150 - 400 K/uL 179   217   195    ? ? ?Lab Results  ?Component Value Date/Time  ? VD25OH 41 08/12/2019 10:00 AM  ? VD25OH 36 12/17/2017 11:21 AM  ? ? ?Clinical ASCVD: No  ?The ASCVD Risk score (Arnett DK, et al., 2019) failed to calculate for the following reasons: ?  The 2019 ASCVD risk score is only valid for ages 25 to 63   ? ? ?  03/21/2021  ?  9:38 AM 11/18/2020  ? 10:02 AM  09/21/2020  ? 11:41 AM  ?Depression screen PHQ 2/9  ?Decreased Interest 0 0 0  ?Down, Depressed, Hopeless 0 0 0  ?PHQ - 2 Score 0 0 0  ?Altered sleeping 0    ?Tired, decreased energy 0    ?Change in appetite 0    ?Feeling bad or failure about yourself  0    ?Trouble concentrating 0    ?Moving slowly or fidgety/restless 0    ?Suicidal thoughts 0    ?PHQ-9 Score 0    ?  ?Social History  ? ?Tobacco Use  ?Smoking Status Never  ?Smokeless Tobacco Never  ?Tobacco Comments  ? smoking cessation materials not required  ? ?BP Readings from Last 3 Encounters:  ?06/10/21 133/63  ?03/21/21 122/70  ?12/09/20 (!) 142/71  ? ?Pulse Readings from Last 3 Encounters:  ?06/10/21 75  ?03/21/21 83  ?12/09/20 80  ? ?Wt Readings from Last 3 Encounters:  ?06/10/21 110 lb 9.6 oz (50.2 kg)  ?03/21/21 108 lb (49 kg)  ?12/09/20 108 lb 3.2 oz (49.1 kg)  ? ?BMI Readings from Last 3 Encounters:  ?06/10/21 20.90 kg/m?  ?03/21/21 20.41 kg/m?  ?12/09/20 20.44 kg/m?  ? ? ?Assessment/Interventions: Review of patient past medical history, allergies, medications, health status, including review of consultants reports, laboratory and other test data, was performed as part of comprehensive evaluation and provision of chronic care management services.  ? ?SDOH:  (Social Determinants of Health) assessments and interventions performed: Yes ? ? ?SDOH Screenings  ? ?Alcohol Screen: Low Risk   ? Last Alcohol Screening Score (AUDIT): 0  ?Depression (PHQ2-9): Low Risk   ? PHQ-2 Score: 0  ?Financial Resource Strain: Low Risk   ? Difficulty of Paying Living Expenses: Not hard at all  ?Food Insecurity: No Food Insecurity  ? Worried About Charity fundraiser in the Last Year: Never true  ? Ran Out of Food in the Last Year: Never true  ?Housing: Low Risk   ? Last Housing Risk Score: 0  ?Physical Activity: Sufficiently Active  ? Days of Exercise per Week: 7 days  ? Minutes of Exercise per Session: 30 min  ?Social Connections: Moderately Isolated  ? Frequency of  Communication with Friends and Family: More than three times a week  ? Frequency of Social Gatherings with Friends and Family: More than three times a  week  ? Attends Religious Services: More than 4 times per year  ? Active Member of Clubs or Organizations: No  ? Attends Archivist Meetings: Never  ? Marital Status: Widowed  ?Stress: No Stress Concern Present  ? Feeling of Stress : Not at all  ?Tobacco Use: Low Risk   ? Smoking Tobacco Use: Never  ? Smokeless Tobacco Use: Never  ? Passive Exposure: Not on file  ?Transportation Needs: No Transportation Needs  ? Lack of Transportation (Medical): No  ? Lack of Transportation (Non-Medical): No  ? ? ?CCM Care Plan ? ?Allergies  ?Allergen Reactions  ? Ace Inhibitors Other (See Comments)  ?  Pt unable to recall. Was advised to avoid  ? ? ?Medications Reviewed Today   ? ? Reviewed by Randie Heinz, [place] (Certified Medical Assistant) on 06/10/21 at 78  Med List Status: <None>  ? ?Medication Order Taking? Sig Documenting Provider Last Dose Status Informant  ?ACCU-CHEK GUIDE test strip [id number]  USE  AS  INSTRUCTED  DAILY Steele Sizer, MD  Active   ?Accu-Chek Softclix Lancets lancets [id number]  USE AS DIRECTED EVERY DAY Steele Sizer, MD  Active   ?Alcohol Swabs PADS [id number]  1 each by Does not apply route 3 (three) times daily. Steele Sizer, MD  Active   ?amiodarone (PACERONE) 200 MG tablet [id number] Yes Take 100 mg by mouth daily. Yolonda Kida, MD Taking Active   ?         ?Med Note [name]   Wed Sep 22, 2015 12:11 PM) Taking half tablet  ?amLODipine-valsartan (EXFORGE) 5-160 MG tablet [id number] Yes TAKE 1 TABLET EVERY DAY Steele Sizer, MD Taking Active   ?aspirin 81 MG tablet [id number] Yes Take 81 mg by mouth daily. [provider] Taking Active   ?atorvastatin (LIPITOR) 80 MG tablet [id number] Yes Take 1 tablet (80 mg total) by mouth daily. Steele Sizer, MD Taking Active   ?Blood Glucose Monitoring Suppl  (ACCU-CHEK GUIDE ME) w/Device KIT [id number]  1 each by Does not apply route daily. Steele Sizer, MD  Active   ?cholecalciferol (VITAMIN D) 1000 UNITS tablet [id number] Yes Take 1,000 Units by mouth daily.  Pro

## 2021-07-06 NOTE — Patient Instructions (Signed)
Visit Information ?It was great speaking with you today!  Please let me know if you have any questions about our visit. ? ? Goals Addressed   ? ?  ?  ?  ?  ? This Visit's Progress  ?  Monitor and Manage My Blood Sugar-Diabetes Type 2   On track  ?  Timeframe:  Long-Range Goal ?Priority:  High ?Start Date: 08/12/2020                              ?Expected End Date:  08/12/2021                     ? ?Follow Up Date 10/26/2020  ?  ?- check blood sugar at prescribed times ?- check blood sugar if I feel it is too high or too low ?- enter blood sugar readings and medication or insulin into daily log  ?  ?Why is this important?   ?Checking your blood sugar at home helps to keep it from getting very high or very low.  ?Writing the results in a diary or log helps the doctor know how to care for you.  ?Your blood sugar log should have the time, date and the results.  ?Also, write down the amount of insulin or other medicine that you take.  ?Other information, like what you ate, exercise done and how you were feeling, will also be helpful.   ?  ?Notes:  ?  ? ?  ? ? ?Patient Care Plan: General Pharmacy (Adult)  ?  ? ?Problem Identified: Hypertension, Hyperlipidemia, Diabetes, Atrial Fibrillation, and Chronic Kidney Disease   ?Priority: High  ?  ? ?Long-Range Goal: Patient-Specific Goal   ?Start Date: 08/12/2020  ?Expected End Date: 09/08/2021  ?This Visit's Progress: On track  ?Recent Progress: On track  ?Priority: High  ?Note:   ?Current Barriers:  ?No barriers noted ? ?Pharmacist Clinical Goal(s):  ?Patient will maintain control of blood pressure as evidenced by BP less than 140/90  ?maintain control of diabetes as evidenced by A1c less than 8%  through collaboration with PharmD and provider.  ? ?Interventions: ?1:1 collaboration with Steele Sizer, MD regarding development and update of comprehensive plan of care as evidenced by provider attestation and co-signature ?Inter-disciplinary care team collaboration (see longitudinal  plan of care) ?Comprehensive medication review performed; medication list updated in electronic medical record ? ?Hypertension (BP goal <140/90) ?-Controlled ?-Current treatment: ?Amlodipine-Valsartan 5-160 mg daily  ?HCTZ 12.5 mg daily  ?-Medications previously tried: NA  ?-Current home readings: NA  ?-Denies hypotensive/hypertensive symptoms ?-Counseled to monitor BP at home weekly, document, and provide log at future appointments ?-Recommended to continue current medication ? ?Atrial Fibrillation (Goal: prevent stroke and major bleeding) ?-Controlled ?-CHADSVASC: 5 ?-Current treatment: ?Rate control: Amiodarone 200 mg 1/2 tablet daily  ?Anticoagulation: Not on anticoagulation has stayed in sinus rhythm continuously. ?-Medications previously tried: NA ?-Recommended to continue current medication ? ?Hyperlipidemia: (LDL goal < 100) ?-Controlled ?-Current treatment: ?Atorvastatin 80 mg daily  ?-Medications previously tried: NA  ?-Recommended to continue current medication ? ?Diabetes (A1c goal <8%) ?-Controlled ?-Current medications: ?Metformin XR 500 mg daily  ?-Medications previously tried: NA  ?-Current home glucose readings ?fasting glucose: 120, 125, 128, 109  ?-Denies hypoglycemic/hyperglycemic symptoms ?-Counseled to check feet daily and get yearly eye exams ?-Recommended to continue current medication ? ?Patient Goals/Self-Care Activities ?Patient will:  ?- check glucose daily before breakfast, document, and provide at future appointments ?  check blood pressure weekly, document, and provide at future appointments ? ?Follow Up Plan: No further follow up required: Patient will reach out to pharmacy team as needed ?  ? ?Patient agreed to services and verbal consent obtained.  ? ?Patient verbalizes understanding of instructions and care plan provided today and agrees to view in [place]. Active MyChart status confirmed with patient.   ? ?Doristine Section, BCACP, CPP ?Clinical Pharmacist Practitioner   ?[facility] ?[phone]  ?

## 2021-07-27 DIAGNOSIS — I4891 Unspecified atrial fibrillation: Secondary | ICD-10-CM

## 2021-07-27 DIAGNOSIS — I10 Essential (primary) hypertension: Secondary | ICD-10-CM

## 2021-07-27 DIAGNOSIS — Z7984 Long term (current) use of oral hypoglycemic drugs: Secondary | ICD-10-CM | POA: Diagnosis not present

## 2021-07-27 DIAGNOSIS — E785 Hyperlipidemia, unspecified: Secondary | ICD-10-CM

## 2021-07-27 DIAGNOSIS — E1159 Type 2 diabetes mellitus with other circulatory complications: Secondary | ICD-10-CM | POA: Diagnosis not present

## 2021-08-24 ENCOUNTER — Other Ambulatory Visit: Payer: Self-pay | Admitting: Family Medicine

## 2021-08-24 DIAGNOSIS — E1169 Type 2 diabetes mellitus with other specified complication: Secondary | ICD-10-CM

## 2021-08-24 DIAGNOSIS — E785 Hyperlipidemia, unspecified: Secondary | ICD-10-CM

## 2021-08-24 DIAGNOSIS — N183 Chronic kidney disease, stage 3 unspecified: Secondary | ICD-10-CM

## 2021-08-24 NOTE — Telephone Encounter (Signed)
Requested Prescriptions  Pending Prescriptions Disp Refills  . Alcohol Swabs (DROPSAFE ALCOHOL PREP) 70 % PADS [Pharmacy Med Name: DROPSAFE ALCOHOL PREP PADS 70 % Pad] 300 each 0    Sig: USE AS DIRECTED THREE TIMES DAILY     Off-Protocol Failed - 08/24/2021 11:13 AM      Failed - Medication not assigned to a protocol, review manually.      Passed - Valid encounter within last 12 months    Recent Outpatient Visits          5 months ago Type 2 diabetes mellitus with stage 3 chronic kidney disease and hypertension ([place])   [facility] [place], Drue Stager, MD   9 months ago Type 2 diabetes mellitus with stage 2 chronic kidney disease, without long-term current use of insulin [facility])   [facility] [place], Drue Stager, MD   1 year ago Type 2 diabetes mellitus with stage 2 chronic kidney disease, without long-term current use of insulin [facility])   [facility] [place], Drue Stager, MD   1 year ago Type 2 diabetes mellitus with stage 2 chronic kidney disease, without long-term current use of insulin [facility])   [facility] Steele Sizer, MD   1 year ago Dry cough   [facility] Steele Sizer, MD      Future Appointments            In 3 weeks [name], Drue Stager, MD [facility] [place]   In 4 weeks  [place]

## 2021-09-16 NOTE — Progress Notes (Unsigned)
Name: Emma Campbell   MRN: [id number]    DOB: 08/02/1932   Date:09/19/2021       Progress Note  Subjective  Chief Complaint  Follow up   HPI  DMII with renal manifestation: CKI and microalbuminuria. Diagnosed many years ago. A1C has been well controlled, it was 6.1 %  last visit 6.5 % it went down to 5.9 %,up to 6 % and today is 6.4 %  she has been off Metformin and still doing well.  Glucose at home still controlled , in the 120-130 's fasting . She denies polyphagia, polydipsia or polyuria - only has frequency right after she takes HCTZ about once a week . She denies hypoglycemic episodes.Continue ARB for kidney protection and also for HTN . Eye exam is up to date. She is on statin therapy for dyslipidemia and last LDL at goal . We will recheck labs today She has a history of microalbuminuria   CKI stage III: she is on ARB ( Exforge ) bp has been well controlled, DM also controlled , no pruritis and she has normal urine output We will recheck labs   HTN: taking medication daily and bp is at goal, no dizziness, chest pain or palpitation.  She tried to stop HCTZ but caused some swelling but controlled with taking it once or twice a week and doing well    Hyperlipidemia: she has been on Atorvastatin last LDL was 70 ( at goal ) she is on to 80 mg daily . We will recheck labs today    Sick Sinus Syndrome: she had a pacemaker placed in 2006 and has been on Amiodarone since 2006, she is now down to 100 mg of Amiodarone daily, last TSH still normal.  No chest pain, dizziness  or palpitation. Sees Dr. Clayborn Bigness , she is [name] score of 2, and is on aspirin only per his recommendation.  She also has a history of Afib that has been rate controlled. She also has a history of AVM's and not good candidate for blood thinners. Unchanged    Vitamin D deficiency: still taking supplementation, denies fatigue, last Vitamin D was normal  . Unchanged    Osteopenia: last FRAX 09/2019 showed risk of major fracture was  4.6 % and hip was 1.1 % , on vitamin D only. Recheck it now    Anemia: iron deficiency and positive hemoccult, seen by Dr. Durwin Reges and had EGD and colonoscopy September 2018,diagnosed with AVM small bowel and had it cauterized, hemoglobin dropped and was referred to Dr. Tasia Catchings,. Reviewed last labs with patient   Patient Active Problem List   Diagnosis Date Noted   B12 deficiency 05/21/2017   Gastrointestinal tract imaging abnormality    Iron deficiency anemia    Polyp of sigmoid colon    Arthritis, degenerative 05/03/2015   Menopausal and perimenopausal disorder 05/03/2015   Chronic kidney disease (CKD), stage III (moderate) ([place]) 11/30/2014   Diverticulosis 11/30/2014   Fibrocystic breast disease 11/30/2014   Elevated LFTs 02/15/7587   Systolic ejection murmur 32/54/9826   Paroxysmal A-fib (HCC) 11/30/2014   Microalbuminuria 07/31/2014   Elevated TSH 07/31/2014   History of cardiac pacemaker 07/31/2014   Hypercholesteremia 08/13/2013   Hypertension, benign 08/13/2013   Diabetes mellitus with renal manifestations, controlled ([place]) 08/13/2013   Avitaminosis D 04/12/2009    Past Surgical History:  Procedure Laterality Date   CATARACT EXTRACTION W/PHACO Right 01/07/2019   Procedure: CATARACT EXTRACTION PHACO AND INTRAOCULAR LENS PLACEMENT (IOC) RIGHT DIABETIC 02:15.1  25.7%        34.69;  Surgeon: Birder Robson, MD;  Location: [place];  Service: Ophthalmology;  Laterality: Right;  Diabetic - oral meds   CATARACT EXTRACTION W/PHACO Left 01/28/2019   Procedure: CATARACT EXTRACTION PHACO AND INTRAOCULAR LENS PLACEMENT (IOC) LEFT DIABETIC 12.96  01:15.5;  Surgeon: Birder Robson, MD;  Location: [place];  Service: Ophthalmology;  Laterality: Left;  Diabetic - oral meds   COLONOSCOPY WITH PROPOFOL N/A 11/06/2016   Procedure: COLONOSCOPY WITH PROPOFOL;  Surgeon: Lucilla Lame, MD;  Location: [place];  Service: Endoscopy;  Laterality: N/A;  DIABETIC    ESOPHAGOGASTRODUODENOSCOPY (EGD) WITH PROPOFOL N/A 11/06/2016   Procedure: ESOPHAGOGASTRODUODENOSCOPY (EGD) WITH PROPOFOL;  Surgeon: Lucilla Lame, MD;  Location: [place];  Service: Endoscopy;  Laterality: N/A;   ESOPHAGOGASTRODUODENOSCOPY (EGD) WITH PROPOFOL N/A 01/23/2017   Procedure: ESOPHAGOGASTRODUODENOSCOPY (EGD) WITH PROPOFOL with PUSH;  Surgeon: Lucilla Lame, MD;  Location: [facility] ENDOSCOPY;  Service: Endoscopy;  Laterality: N/A;   GIVENS CAPSULE STUDY N/A 12/22/2016   Procedure: GIVENS CAPSULE STUDY;  Surgeon: Lucilla Lame, MD;  [facility] ENDOSCOPY;  Service: Endoscopy;  Laterality: N/A;   INSERT / REPLACE / REMOVE PACEMAKER     PACEMAKER INSERTION  2006   POLYPECTOMY  11/06/2016   Procedure: POLYPECTOMY;  Surgeon: Lucilla Lame, MD;  Location: [place];  Service: Endoscopy;;    Family History  Problem Relation Age of Onset   Hypertension Mother    Stroke Mother    Diabetes Daughter    Stroke Father    Healthy Sister    Hypertension Maternal Grandmother     Social History   Tobacco Use   Smoking status: Never   Smokeless tobacco: Never   Tobacco comments:    smoking cessation materials not required  Substance Use Topics   Alcohol use: No    Alcohol/week: 0.0 standard drinks of alcohol     Current Outpatient Medications:    ACCU-CHEK GUIDE test strip, USE  AS  INSTRUCTED  DAILY, Disp: 100 strip, Rfl: 2   Accu-Chek Softclix Lancets lancets, USE AS DIRECTED EVERY DAY, Disp: 100 each, Rfl: 2   Alcohol Swabs (DROPSAFE ALCOHOL PREP) 70 % PADS, USE AS DIRECTED THREE TIMES DAILY, Disp: 300 each, Rfl: 0   amiodarone (PACERONE) 200 MG tablet, Take 100 mg by mouth daily., Disp: , Rfl:    amLODipine-valsartan (EXFORGE) 5-160 MG tablet, TAKE 1 TABLET EVERY DAY, Disp: 90 tablet, Rfl: 1   aspirin 81 MG tablet, Take 81 mg by mouth daily., Disp: , Rfl:    atorvastatin (LIPITOR) 80 MG tablet, TAKE 1 TABLET EVERY DAY, Disp: 90 tablet, Rfl: 1   Blood Glucose  Monitoring Suppl (ACCU-CHEK GUIDE ME) w/Device KIT, 1 each by Does not apply route daily., Disp: 1 kit, Rfl: 0   cholecalciferol (VITAMIN D) 1000 UNITS tablet, Take 1,000 Units by mouth daily. , Disp: , Rfl:    Cyanocobalamin (B-12 PO), Take by mouth., Disp: , Rfl:    ferrous sulfate 325 (65 FE) MG tablet, Take 325 mg by mouth daily with breakfast., Disp: , Rfl:    folic acid (FOLVITE) 294 MCG tablet, Take 400 mcg by mouth daily., Disp: , Rfl:    hydrochlorothiazide (HYDRODIURIL) 12.5 MG tablet, Take 1 tablet (12.5 mg total) by mouth daily., Disp: 90 tablet, Rfl: 1   Multiple Vitamins-Minerals (CENTRUM SILVER 50+WOMEN PO), Take 1 tablet by mouth daily., Disp: , Rfl:   Allergies  Allergen Reactions   Ace Inhibitors Other (  See Comments)    Pt unable to recall. Was advised to avoid    I personally reviewed active problem list, medication list, allergies, family history, social history, health maintenance with the patient/caregiver today.   ROS  Constitutional: Negative for fever , positive for mild weight change.  Respiratory: Negative for cough and shortness of breath.   Cardiovascular: Negative for chest pain or palpitations.  Gastrointestinal: Negative for abdominal pain, no bowel changes.  Musculoskeletal: Negative for gait problem or joint swelling.  Skin: Negative for rash.  Neurological: Negative for dizziness or headache.  No other specific complaints in a complete review of systems (except as listed in HPI above).   Objective  Vitals:   09/19/21 0937  BP: 136/64  Pulse: 83  Resp: 16  SpO2: 98%  Weight: 112 lb (50.8 kg)  Height: 5' 2"  (1.575 m)    Body mass index is 20.49 kg/m.  Physical Exam  Constitutional: Patient appears well-developed and well-nourished.  No distress.  HEENT: head atraumatic, normocephalic, pupils equal and reactive to light, neck supple Cardiovascular: Normal rate, regular rhythm and normal heart sounds.  3/6 SEM murmur heard. No BLE  edema. Pulmonary/Chest: Effort normal and breath sounds normal. No respiratory distress. Abdominal: Soft.  There is no tenderness. Psychiatric: Patient has a normal mood and affect. behavior is normal. Judgment and thought content normal.   Recent Results (from the past 2160 hour(s))  POCT HgB A1C     Status: Abnormal   Collection Time: 09/19/21  9:38 AM  Result Value Ref Range   Hemoglobin A1C 6.4 (A) 4.0 - 5.6 %   HbA1c POC (<> result, manual entry)     HbA1c, POC (prediabetic range)     HbA1c, POC (controlled diabetic range)      PHQ2/9:    09/19/2021    9:38 AM 03/21/2021    9:38 AM 11/18/2020   10:02 AM 09/21/2020   11:41 AM 08/16/2020   12:55 PM  Depression screen PHQ 2/9  Decreased Interest 0 0 0 0 0  Down, Depressed, Hopeless 0 0 0 0 0  PHQ - 2 Score 0 0 0 0 0  Altered sleeping 0 0     Tired, decreased energy 0 0     Change in appetite 0 0     Feeling bad or failure about yourself  0 0     Trouble concentrating 0 0     Moving slowly or fidgety/restless 0 0     Suicidal thoughts 0 0     PHQ-9 Score 0 0       phq 9 is negative   Fall Risk:    09/19/2021    9:38 AM 03/21/2021    9:38 AM 11/18/2020   10:02 AM 09/21/2020   11:43 AM 08/16/2020   12:55 PM  Fall Risk   Falls in the past year? 0 0 0 0 0  Number falls in past yr: 0 0  0 0  Injury with Fall? 0 0  0 0  Risk for fall due to : No Fall Risks No Fall Risks  No Fall Risks   Follow up Falls prevention discussed Falls prevention discussed Falls prevention discussed Falls prevention discussed       Functional Status Survey: Is the patient deaf or have difficulty hearing?: No Does the patient have difficulty seeing, even when wearing glasses/contacts?: No Does the patient have difficulty concentrating, remembering, or making decisions?: No Does the patient have difficulty walking or climbing stairs?: No  Does the patient have difficulty dressing or bathing?: No Does the patient have difficulty doing errands  alone such as visiting a doctor's office or shopping?: No    Assessment & Plan  1. Controlled type 2 diabetes mellitus with stage 3 chronic kidney disease, without long-term current use of insulin (HCC)  - POCT HgB A1C - COMPLETE METABOLIC PANEL WITH GFR - Urine Microalbumin w/creat. ratio - amLODipine-valsartan (EXFORGE) 5-160 MG tablet; Take 1 tablet by mouth daily.  Dispense: 90 tablet; Refill: 1  2. Paroxysmal A-fib (HCC)  Back on low dose aspirin   3. Osteopenia of multiple sites  - DG Bone Density; Future  4. Dyslipidemia associated with type 2 diabetes mellitus ([place])  - Lipid panel  5. Vitamin D deficiency   6. B12 deficiency  Under the care of Dr. Tasia Catchings  7. Hypertension, benign  At goal   8. Elevated TSH  - TSH  9. Pacemaker

## 2021-09-19 ENCOUNTER — Encounter: Payer: Self-pay | Admitting: Family Medicine

## 2021-09-19 ENCOUNTER — Ambulatory Visit (INDEPENDENT_AMBULATORY_CARE_PROVIDER_SITE_OTHER): Payer: Medicare HMO | Admitting: Family Medicine

## 2021-09-19 VITALS — BP 136/64 | HR 83 | Resp 16 | Ht 62.0 in | Wt 112.0 lb

## 2021-09-19 DIAGNOSIS — N183 Chronic kidney disease, stage 3 unspecified: Secondary | ICD-10-CM

## 2021-09-19 DIAGNOSIS — M8589 Other specified disorders of bone density and structure, multiple sites: Secondary | ICD-10-CM | POA: Diagnosis not present

## 2021-09-19 DIAGNOSIS — E785 Hyperlipidemia, unspecified: Secondary | ICD-10-CM | POA: Diagnosis not present

## 2021-09-19 DIAGNOSIS — R7989 Other specified abnormal findings of blood chemistry: Secondary | ICD-10-CM

## 2021-09-19 DIAGNOSIS — E538 Deficiency of other specified B group vitamins: Secondary | ICD-10-CM

## 2021-09-19 DIAGNOSIS — E1122 Type 2 diabetes mellitus with diabetic chronic kidney disease: Secondary | ICD-10-CM | POA: Diagnosis not present

## 2021-09-19 DIAGNOSIS — E559 Vitamin D deficiency, unspecified: Secondary | ICD-10-CM | POA: Diagnosis not present

## 2021-09-19 DIAGNOSIS — E1169 Type 2 diabetes mellitus with other specified complication: Secondary | ICD-10-CM

## 2021-09-19 DIAGNOSIS — I480 Paroxysmal atrial fibrillation: Secondary | ICD-10-CM

## 2021-09-19 DIAGNOSIS — Z950 Presence of cardiac pacemaker: Secondary | ICD-10-CM

## 2021-09-19 DIAGNOSIS — I10 Essential (primary) hypertension: Secondary | ICD-10-CM | POA: Diagnosis not present

## 2021-09-19 LAB — POCT GLYCOSYLATED HEMOGLOBIN (HGB A1C): Hemoglobin A1C: 6.4 % — AB (ref 4.0–5.6)

## 2021-09-19 MED ORDER — AMLODIPINE BESYLATE-VALSARTAN 5-160 MG PO TABS: 1.0000 | ORAL_TABLET | Freq: Every day | ORAL | 1 refills | Status: DC

## 2021-09-20 LAB — COMPLETE METABOLIC PANEL WITH GFR
AG Ratio: 1.3 (calc) (ref 1.0–2.5)
ALT: 19 U/L (ref 6–29)
AST: 20 U/L (ref 10–35)
Albumin: 3.9 g/dL (ref 3.6–5.1)
Alkaline phosphatase (APISO): 68 U/L (ref 37–153)
BUN/Creatinine Ratio: 17 (calc) (ref 6–22)
BUN: 18 mg/dL (ref 7–25)
CO2: 23 mmol/L (ref 20–32)
Calcium: 8.9 mg/dL (ref 8.6–10.4)
Chloride: 103 mmol/L (ref 98–110)
Creat: 1.07 mg/dL — ABNORMAL HIGH (ref 0.60–0.95)
Globulin: 2.9 g/dL (calc) (ref 1.9–3.7)
Glucose, Bld: 114 mg/dL — ABNORMAL HIGH (ref 65–99)
Potassium: 4.6 mmol/L (ref 3.5–5.3)
Sodium: 139 mmol/L (ref 135–146)
Total Bilirubin: 0.5 mg/dL (ref 0.2–1.2)
Total Protein: 6.8 g/dL (ref 6.1–8.1)
eGFR: 50 mL/min/{1.73_m2} — ABNORMAL LOW (ref >=60)

## 2021-09-20 LAB — TSH: TSH: 3.42 mIU/L (ref 0.40–4.50)

## 2021-09-20 LAB — MICROALBUMIN / CREATININE URINE RATIO
Creatinine, Urine: 48 mg/dL (ref 20–275)
Microalb Creat Ratio: 25 mcg/mg creat (ref <30)
Microalb, Ur: 1.2 mg/dL

## 2021-09-20 LAB — LIPID PANEL
Cholesterol: 188 mg/dL (ref <200)
HDL: 90 mg/dL (ref >=50)
LDL Cholesterol (Calc): 84 mg/dL (calc)
Non-HDL Cholesterol (Calc): 98 mg/dL (calc) (ref <130)
Total CHOL/HDL Ratio: 2.1 (calc) (ref <5.0)
Triglycerides: 64 mg/dL (ref <150)

## 2021-09-22 ENCOUNTER — Ambulatory Visit (INDEPENDENT_AMBULATORY_CARE_PROVIDER_SITE_OTHER): Payer: Medicare HMO

## 2021-09-22 DIAGNOSIS — Z0000 Encounter for general adult medical examination without abnormal findings: Secondary | ICD-10-CM | POA: Diagnosis not present

## 2021-09-22 NOTE — Progress Notes (Signed)
I connected with  Emma Campbell on 09/22/21 by a audio enabled telemedicine application and verified that I am speaking with the correct person using two identifiers.  Patient Location: Home  Provider Location: Office/Clinic  I discussed the limitations of evaluation and management by telemedicine. The patient expressed understanding and agreed to proceed.    Annual Wellness Visit     Patient: Emma Campbell, Female    DOB: 04/08/1932, 88 y.o.   MRN: [id number]  Subjective  I connected with  Emma Campbell on 09/22/21 by a audio enabled telemedicine application and verified that I am speaking with the correct person using two identifiers.  Patient Location: Home  Provider Location: Office/Clinic  I discussed the limitations of evaluation and management by telemedicine. The patient expressed understanding and agreed to proceed.   Emma Campbell is a 88 y.o. female who presents today for her Annual Wellness Visit. She reports consuming a general diet. The patient does not participate in regular exercise at present. She generally feels well. She reports sleeping well. She does not have additional problems to discuss today.   HPI  Vision:Patient has an appointment scheduled for October 2023.   Patient Active Problem List   Diagnosis Date Noted   Osteopenia of multiple sites 09/19/2021   B12 deficiency 05/21/2017   Gastrointestinal tract imaging abnormality    Iron deficiency anemia    Polyp of sigmoid colon    Arthritis, degenerative 05/03/2015   Menopausal and perimenopausal disorder 05/03/2015   Chronic kidney disease (CKD), stage III (moderate) (HCC) 11/30/2014   Diverticulosis 11/30/2014   Fibrocystic breast disease 11/30/2014   Elevated LFTs 99/77/4142   Systolic ejection murmur 39/53/2023   Paroxysmal A-fib (HCC) 11/30/2014   Microalbuminuria 07/31/2014   Elevated TSH 07/31/2014   Pacemaker 07/31/2014   Hypercholesteremia 08/13/2013   Hypertension, benign 08/13/2013    Type 2 diabetes mellitus with stage 3 chronic kidney disease and hypertension ([place]) 08/13/2013   Vitamin D deficiency 04/12/2009   Past Medical History:  Diagnosis Date   Anemia    Chronic kidney disease, stage III (moderate) (HCC)    Diverticulosis    Elevated LFTs    Essential hypertension, malignant    Fibrocystic breast disease    Heart murmur    Hyperlipidemia    Menopausal problem    Osteoarthrosis    Osteoporosis    Peripheral autonomic neuropathy    Presence of permanent cardiac pacemaker    Sick sinus syndrome (HCC)    Dr. Alfredo Batty   Type II diabetes mellitus with renal manifestations (HCC)    Vitamin D deficiency       Medications: Outpatient Medications Prior to Visit  Medication Sig   ACCU-CHEK GUIDE test strip USE  AS  INSTRUCTED  DAILY   Accu-Chek Softclix Lancets lancets USE AS DIRECTED EVERY DAY   Alcohol Swabs (DROPSAFE ALCOHOL PREP) 70 % PADS USE AS DIRECTED THREE TIMES DAILY   amiodarone (PACERONE) 200 MG tablet Take 100 mg by mouth daily.   amLODipine-valsartan (EXFORGE) 5-160 MG tablet Take 1 tablet by mouth daily.   aspirin 81 MG tablet Take 81 mg by mouth daily.   atorvastatin (LIPITOR) 80 MG tablet TAKE 1 TABLET EVERY DAY   Blood Glucose Monitoring Suppl (ACCU-CHEK GUIDE ME) w/Device KIT 1 each by Does not apply route daily.   cholecalciferol (VITAMIN D) 1000 UNITS tablet Take 1,000 Units by mouth daily.    folic acid (FOLVITE) 343 MCG tablet Take 400 mcg by mouth daily.  hydrochlorothiazide (HYDRODIURIL) 12.5 MG tablet Take 1 tablet (12.5 mg total) by mouth daily.   Multiple Vitamins-Minerals (CENTRUM SILVER 50+WOMEN PO) Take 1 tablet by mouth daily.   No facility-administered medications prior to visit.    Allergies  Allergen Reactions   Ace Inhibitors Other (See Comments)    Pt unable to recall. Was advised to avoid    Patient Care Team: Steele Sizer, MD as PCP - General (Family Medicine) Yolonda Kida, MD as Consulting  Physician (Cardiology) Earlie Server, MD as Consulting Physician (Oncology) Germaine Pomfret, [facility] as Pharmacist (Pharmacist)  ROS      Objective  There were no vitals taken for this visit. BP Readings from Last 3 Encounters:  09/19/21 136/64  06/10/21 133/63  03/21/21 122/70      Physical Exam    Most recent functional status assessment:    09/22/2021    1:04 PM  In your present state of health, do you have any difficulty performing the following activities:  Hearing? 0  Vision? 0  Difficulty concentrating or making decisions? 0  Walking or climbing stairs? 0  Dressing or bathing? 0  Doing errands, shopping? 0   Most recent fall risk assessment:    09/22/2021    1:03 PM  [place] in the past year? 0  Risk for fall due to : No Fall Risks  Follow up Falls prevention discussed;Education provided;Falls evaluation completed    Most recent depression screenings:    09/22/2021    1:00 PM 09/19/2021    9:38 AM  PHQ 2/9 Scores  PHQ - 2 Score 0 0  PHQ- 9 Score 0 0   Most recent cognitive screening:    09/22/2021    1:03 PM  6CIT Screen  What Year? 0 points  What month? 0 points  What time? 0 points  Count back from 20 0 points  Months in reverse 0 points  Repeat phrase 0 points  Total Score 0 points   Most recent Audit-C alcohol use screening    09/22/2021   12:59 PM  Alcohol Use Disorder Test (AUDIT)  1. How often do you have a drink containing alcohol? 0   A score of 3 or more in women, and 4 or more in men indicates increased risk for alcohol abuse, EXCEPT if all of the points are from question 1   Vision/Hearing Screen: No results found.  Last CBC Lab Results  Component Value Date   WBC 3.7 (L) 06/08/2021   HGB 11.7 (L) 06/08/2021   HCT 36.1 06/08/2021   MCV 91.2 06/08/2021   MCH 29.5 06/08/2021   RDW 12.9 06/08/2021   PLT 179 06/08/2021      No results found for any visits on 09/22/21.    Assessment & Plan   Annual wellness  visit done today including the all of the following: Reviewed patient's Family Medical History Reviewed and updated list of patient's medical providers Assessment of cognitive impairment was done Assessed patient's functional ability Established a written schedule for health screening [place] Completed and Reviewed  Exercise Activities and Dietary recommendations  Goals      DIET - INCREASE WATER INTAKE     Recommend to drink at least 6-8 8oz glasses of water per day.      Monitor and Manage My Blood Sugar-Diabetes Type 2     Timeframe:  Long-Range Goal Priority:  High Start Date: 08/12/2020  Expected End Date:  08/12/2021                      Follow Up Date 10/26/2020    - check blood sugar at prescribed times - check blood sugar if I feel it is too high or too low - enter blood sugar readings and medication or insulin into daily log    Why is this important?   Checking your blood sugar at home helps to keep it from getting very high or very low.  Writing the results in a diary or log helps the doctor know how to care for you.  Your blood sugar log should have the time, date and the results.  Also, write down the amount of insulin or other medicine that you take.  Other information, like what you ate, exercise done and how you were feeling, will also be helpful.     Notes:         Immunization History  Administered Date(s) Administered   Fluad Quad(high Dose 65+) 11/19/2018, 12/15/2019   Influenza, High Dose Seasonal PF 11/30/2014, 12/09/2015, 12/11/2016, 12/17/2017, 11/18/2020   Influenza-Unspecified 10/28/2013   PFIZER Comirnaty(Gray Top)Covid-19 Tri-Sucrose Vaccine 09/17/2020   PFIZER(Purple Top)SARS-COV-2 Vaccination 04/03/2019, 04/24/2019, 11/27/2019   Pneumococcal Conjugate-13 07/31/2014   Pneumococcal Polysaccharide-23 08/11/2009   Tdap 08/11/2009, 10/16/2016   Zoster Recombinat (Shingrix) 08/25/2020, 10/29/2020    Zoster, Live 12/19/2011    Health Maintenance  Topic Date Due   COVID-19 Vaccine (5 - Booster for [employer] series) 10/08/2021 (Originally 11/12/2020)   OPHTHALMOLOGY EXAM  09/24/2021   INFLUENZA VACCINE  09/27/2021   FOOT EXAM  11/18/2021   HEMOGLOBIN A1C  03/22/2022   TETANUS/TDAP  10/17/2026   Pneumonia Vaccine 37+ Years old  Completed   DEXA SCAN  Completed   Zoster Vaccines- Shingrix  Completed   HPV VACCINES  Aged Out     Discussed health benefits of physical activity, and encouraged her to engage in regular exercise appropriate for her age and condition.    Problem List Items Addressed This Visit   None Visit Diagnoses     Encounter for Medicare annual wellness exam    -  Primary     Non-face to face 28 minute visit.   Ms. Gallant , Thank you for taking time to come for your Medicare Wellness Visit. I appreciate your ongoing commitment to your health goals. Please review the following plan we discussed and let me know if I can assist you in the future.   These are the goals we discussed:  Goals      DIET - INCREASE WATER INTAKE     Recommend to drink at least 6-8 8oz glasses of water per day.     Monitor and Manage My Blood Sugar-Diabetes Type 2     Timeframe:  Long-Range Goal Priority:  High Start Date: 08/12/2020                              Expected End Date:  08/12/2021                      Follow Up Date 10/26/2020    - check blood sugar at prescribed times - check blood sugar if I feel it is too high or too low - enter blood sugar readings and medication or insulin into daily log    Why is this important?   Checking your blood  sugar at home helps to keep it from getting very high or very low.  Writing the results in a diary or log helps the doctor know how to care for you.  Your blood sugar log should have the time, date and the results.  Also, write down the amount of insulin or other medicine that you take.  Other information, like what you ate,  exercise done and how you were feeling, will also be helpful.     Notes:         This is a list of the screening recommended for you and due dates:  Health Maintenance  Topic Date Due   COVID-19 Vaccine (5 - Booster for [place] series) 10/08/2021*   Eye exam for diabetics  09/24/2021   Flu Shot  09/27/2021   Complete foot exam   11/18/2021   Hemoglobin A1C  03/22/2022   Tetanus Vaccine  10/17/2026   Pneumonia Vaccine  Completed   DEXA scan (bone density measurement)  Completed   Zoster (Shingles) Vaccine  Completed   HPV Vaccine  Aged Out  *Topic was postponed. The date shown is not the original due date.     Return in 1 year (on 09/23/2022).     Royal Hawthorn, CMA

## 2021-09-22 NOTE — Patient Instructions (Signed)
  Emma Campbell , Thank you for taking time to come for your Medicare Wellness Visit. I appreciate your ongoing commitment to your health goals. Please review the following plan we discussed and let me know if I can assist you in the future.   These are the goals we discussed:  Goals      DIET - INCREASE WATER INTAKE     Recommend to drink at least 6-8 8oz glasses of water per day.     Monitor and Manage My Blood Sugar-Diabetes Type 2     Timeframe:  Long-Range Goal Priority:  High Start Date: 08/12/2020                              Expected End Date:  08/12/2021                      Follow Up Date 10/26/2020    - check blood sugar at prescribed times - check blood sugar if I feel it is too high or too low - enter blood sugar readings and medication or insulin into daily log    Why is this important?   Checking your blood sugar at home helps to keep it from getting very high or very low.  Writing the results in a diary or log helps the doctor know how to care for you.  Your blood sugar log should have the time, date and the results.  Also, write down the amount of insulin or other medicine that you take.  Other information, like what you ate, exercise done and how you were feeling, will also be helpful.     Notes:         This is a list of the screening recommended for you and due dates:  Health Maintenance  Topic Date Due   COVID-19 Vaccine (5 - Booster for [place] series) 10/08/2021*   Eye exam for diabetics  09/24/2021   Flu Shot  09/27/2021   Complete foot exam   11/18/2021   Hemoglobin A1C  03/22/2022   Tetanus Vaccine  10/17/2026   Pneumonia Vaccine  Completed   DEXA scan (bone density measurement)  Completed   Zoster (Shingles) Vaccine  Completed   HPV Vaccine  Aged Out  *Topic was postponed. The date shown is not the original due date.

## 2021-09-28 ENCOUNTER — Other Ambulatory Visit: Payer: Self-pay | Admitting: Family Medicine

## 2021-11-16 DIAGNOSIS — R001 Bradycardia, unspecified: Secondary | ICD-10-CM | POA: Diagnosis not present

## 2021-11-28 ENCOUNTER — Ambulatory Visit (INDEPENDENT_AMBULATORY_CARE_PROVIDER_SITE_OTHER): Payer: Medicare HMO

## 2021-11-28 DIAGNOSIS — Z23 Encounter for immunization: Secondary | ICD-10-CM | POA: Diagnosis not present

## 2021-12-05 DIAGNOSIS — E119 Type 2 diabetes mellitus without complications: Secondary | ICD-10-CM | POA: Diagnosis not present

## 2021-12-05 DIAGNOSIS — Z0100 Encounter for examination of eyes and vision without abnormal findings: Secondary | ICD-10-CM | POA: Diagnosis not present

## 2021-12-05 LAB — HM DIABETES EYE EXAM

## 2021-12-12 ENCOUNTER — Inpatient Hospital Stay: Payer: Medicare HMO | Attending: Oncology

## 2021-12-12 DIAGNOSIS — D500 Iron deficiency anemia secondary to blood loss (chronic): Secondary | ICD-10-CM

## 2021-12-12 DIAGNOSIS — D509 Iron deficiency anemia, unspecified: Secondary | ICD-10-CM | POA: Insufficient documentation

## 2021-12-12 LAB — CBC WITH DIFFERENTIAL/PLATELET
Abs Immature Granulocytes: 0.01 10*3/uL (ref 0.00–0.07)
Basophils Absolute: 0 10*3/uL (ref 0.0–0.1)
Basophils Relative: 1 %
Eosinophils Absolute: 0.2 10*3/uL (ref 0.0–0.5)
Eosinophils Relative: 5 %
HCT: 36.4 % (ref 36.0–46.0)
Hemoglobin: 11.9 g/dL — ABNORMAL LOW (ref 12.0–15.0)
Immature Granulocytes: 0 %
Lymphocytes Relative: 37 %
Lymphs Abs: 1.3 10*3/uL (ref 0.7–4.0)
MCH: 29.8 pg (ref 26.0–34.0)
MCHC: 32.7 g/dL (ref 30.0–36.0)
MCV: 91 fL (ref 80.0–100.0)
Monocytes Absolute: 0.3 10*3/uL (ref 0.1–1.0)
Monocytes Relative: 8 %
Neutro Abs: 1.8 10*3/uL (ref 1.7–7.7)
Neutrophils Relative %: 49 %
Platelets: 182 10*3/uL (ref 150–400)
RBC: 4 MIL/uL (ref 3.87–5.11)
RDW: 13.1 % (ref 11.5–15.5)
WBC: 3.6 10*3/uL — ABNORMAL LOW (ref 4.0–10.5)
nRBC: 0 % (ref 0.0–0.2)

## 2021-12-12 LAB — IRON AND TIBC
Iron: 78 ug/dL (ref 28–170)
Saturation Ratios: 27 % (ref 10.4–31.8)
TIBC: 291 ug/dL (ref 250–450)
UIBC: 213 ug/dL

## 2021-12-12 LAB — FERRITIN: Ferritin: 100 ng/mL (ref 11–307)

## 2021-12-12 LAB — VITAMIN B12: Vitamin B-12: 255 pg/mL (ref 180–914)

## 2021-12-14 ENCOUNTER — Ambulatory Visit: Payer: Medicare HMO | Admitting: Oncology

## 2021-12-26 ENCOUNTER — Inpatient Hospital Stay (HOSPITAL_BASED_OUTPATIENT_CLINIC_OR_DEPARTMENT_OTHER): Payer: Medicare HMO | Admitting: Oncology

## 2021-12-26 ENCOUNTER — Encounter: Payer: Self-pay | Admitting: Oncology

## 2021-12-26 VITALS — BP 149/71 | HR 59 | Temp 96.0°F | Resp 18 | Wt 111.2 lb

## 2021-12-26 DIAGNOSIS — N189 Chronic kidney disease, unspecified: Secondary | ICD-10-CM | POA: Diagnosis not present

## 2021-12-26 DIAGNOSIS — D631 Anemia in chronic kidney disease: Secondary | ICD-10-CM | POA: Diagnosis not present

## 2021-12-26 DIAGNOSIS — D509 Iron deficiency anemia, unspecified: Secondary | ICD-10-CM | POA: Diagnosis not present

## 2021-12-26 DIAGNOSIS — E538 Deficiency of other specified B group vitamins: Secondary | ICD-10-CM | POA: Diagnosis not present

## 2021-12-26 NOTE — Assessment & Plan Note (Addendum)
#   Iron deficiency anemia, Labs reviewed and discussed with patient Hemoglobin >10  No need for erythropoietin replacement therapy.   Iron panel is stable. Continue oral iron supplementation 2 time per week.

## 2021-12-26 NOTE — Assessment & Plan Note (Addendum)
B12 level is at low normal end.  Recommend vitamin B12 1023mg 3-4 times per week.

## 2021-12-26 NOTE — Progress Notes (Signed)
Hematology/Oncology Progress note [phone] [phone]      Patient Care Team: Steele Sizer, MD as PCP - General (Family Medicine) Yolonda Kida, MD as Consulting Physician (Cardiology) Earlie Server, MD as Consulting Physician (Oncology) Germaine Pomfret, [facility] as Pharmacist (Pharmacist)  ASSESSMENT & PLAN:   Anemia in chronic kidney disease (CKD) # Iron deficiency anemia, Labs reviewed and discussed with patient Hemoglobin >10  No need for erythropoietin replacement therapy.   Iron panel is stable. Continue oral iron supplementation 2 time per week.   B12 deficiency B12 level is at low normal end.  Recommend vitamin B12 1057mg 3-4 times per week.   Orders Placed This Encounter  Procedures   CBC with Differential/Platelet    Standing Status:   Future    Standing Expiration Date:   12/27/2022   Iron and TIBC    Standing Status:   Future    Standing Expiration Date:   12/27/2022   Ferritin    Standing Status:   Future    Standing Expiration Date:   12/27/2022   Retic Panel    Standing Status:   Future    Standing Expiration Date:   12/27/2022   Follow up in 6 months.  All questions were answered. The patient knows to call the clinic with any problems, questions or concerns.  ZEarlie Server MD, PhD [facility] Oncology 12/26/2021   REASON FOR VISIT Follow up for treatment of anemia  HISTORY OF PRESENTING ILLNESS:  Emma CAPPELLIis a  88y.o.  female with PMH listed below who was referred to me for evaluation of anemia.  Patient follows up with primary care physician and has had labs done recently. 04/17/17 CBC showed hemoglobin 9, MCV 81.9, platelet 245,000, WBC 4.1, normal differential.  Iron panel showed TIBC 442, ferritin 11, saturation 15%. Previous GI workup:  # 01/23/2017 EGD showed non bleeding angiotecsia, treated with APC. 12/22/2016 Capsule study showed duodenum AVM.  11/06/2016 colonoscopy showed non bleeding hemorroids,  sigmoid polyp, and diverticulosis.   INTERVAL HISTORY Emma Campbell a 88y.o. female who has above history reviewed by me today presents for follow up for management of iron deficiency anemia.  Patient currently not taking iron and B12 supplementations.  No new complaints.she feels well.   Review of Systems  Constitutional:  Negative for chills, fever, malaise/fatigue and weight loss.  HENT:  Negative for sore throat.   Eyes:  Negative for redness.  Respiratory:  Negative for cough, shortness of breath and wheezing.   Cardiovascular:  Negative for chest pain, palpitations and leg swelling.  Gastrointestinal:  Negative for abdominal pain, blood in stool, nausea and vomiting.  Genitourinary:  Negative for dysuria.  Musculoskeletal:  Negative for myalgias.  Skin:  Negative for rash.  Neurological:  Negative for dizziness, tingling and tremors.  Endo/Heme/Allergies:  Does not bruise/bleed easily.  Psychiatric/Behavioral:  Negative for hallucinations.     MEDICAL HISTORY:  Past Medical History:  Diagnosis Date   Anemia    Chronic kidney disease, stage III (moderate) (HCC)    Diverticulosis    Elevated LFTs    Essential hypertension, malignant    Fibrocystic breast disease    Heart murmur    Hyperlipidemia    Menopausal problem    Osteoarthrosis    Osteoporosis    Peripheral autonomic neuropathy    Presence of permanent cardiac pacemaker    Sick sinus syndrome (HCC)    Dr. CAlfredo Batty  Type II diabetes mellitus  with renal manifestations ([place])    Vitamin D deficiency     SURGICAL HISTORY: Past Surgical History:  Procedure Laterality Date   CATARACT EXTRACTION W/PHACO Right 01/07/2019   Procedure: CATARACT EXTRACTION PHACO AND INTRAOCULAR LENS PLACEMENT (IOC) RIGHT DIABETIC 02:15.1          25.7%        34.69;  Surgeon: Birder Robson, MD;  Location: [place];  Service: Ophthalmology;  Laterality: Right;  Diabetic - oral meds   CATARACT EXTRACTION  W/PHACO Left 01/28/2019   Procedure: CATARACT EXTRACTION PHACO AND INTRAOCULAR LENS PLACEMENT (IOC) LEFT DIABETIC 12.96  01:15.5;  Surgeon: Birder Robson, MD;  Location: [place];  Service: Ophthalmology;  Laterality: Left;  Diabetic - oral meds   COLONOSCOPY WITH PROPOFOL N/A 11/06/2016   Procedure: COLONOSCOPY WITH PROPOFOL;  Surgeon: Lucilla Lame, MD;  Location: [place];  Service: Endoscopy;  Laterality: N/A;  DIABETIC   ESOPHAGOGASTRODUODENOSCOPY (EGD) WITH PROPOFOL N/A 11/06/2016   Procedure: ESOPHAGOGASTRODUODENOSCOPY (EGD) WITH PROPOFOL;  Surgeon: Lucilla Lame, MD;  Location: [place];  Service: Endoscopy;  Laterality: N/A;   ESOPHAGOGASTRODUODENOSCOPY (EGD) WITH PROPOFOL N/A 01/23/2017   Procedure: ESOPHAGOGASTRODUODENOSCOPY (EGD) WITH PROPOFOL with PUSH;  Surgeon: Lucilla Lame, MD;  Location: [facility] ENDOSCOPY;  Service: Endoscopy;  Laterality: N/A;   GIVENS CAPSULE STUDY N/A 12/22/2016   Procedure: GIVENS CAPSULE STUDY;  Surgeon: Lucilla Lame, MD;  [facility]: [facility] ENDOSCOPY;  Service: Endoscopy;  Laterality: N/A;   INSERT / REPLACE / REMOVE PACEMAKER     PACEMAKER INSERTION  2006   POLYPECTOMY  11/06/2016   Procedure: POLYPECTOMY;  Surgeon: Lucilla Lame, MD;  Location: [place];  Service: Endoscopy;;    SOCIAL HISTORY: Social History   Socioeconomic History   Marital status: Widowed    Spouse name: Shanon Brow   Number of children: 1   Years of education: 2 years of college   Highest education level: 12th grade  Occupational History   Occupation: Retired Agricultural engineer at [employer] Use   Smoking status: Never   Smokeless tobacco: Never   Tobacco comments:    smoking cessation materials not required  Vaping Use   Vaping Use: Never used  Substance and Sexual Activity   Alcohol use: No    Alcohol/week: 0.0 standard drinks of alcohol   Drug use: No   Sexual activity: Never  Other Topics Concern   Not on file  Social History  Narrative   She still has a house but her daughter and grand-daughter are staying at her house   She moved in with her older sister ( 61 years older), to help her out.    Social Determinants of Health   Financial Resource Strain: Low Risk  (09/22/2021)   Overall Financial Resource Strain (CARDIA)    Difficulty of Paying Living Expenses: Not hard at all  Food Insecurity: No Food Insecurity (09/22/2021)   Hunger Vital Sign    Worried About Running Out of Food in the Last Year: Never true    Ran Out of Food in the Last Year: Never true  Transportation Needs: No Transportation Needs (09/22/2021)   PRAPARE - Hydrologist (Medical): No    Lack of Transportation (Non-Medical): No  Physical Activity: Inactive (09/22/2021)   Exercise Vital Sign    Days of Exercise per Week: 0 days    Minutes of Exercise per Session: 0 min  Stress: No Stress Concern Present (09/22/2021)   [employer] of  Occupational Health - Occupational Stress Questionnaire    Feeling of Stress : Not at all  Social Connections: Moderately Isolated (09/22/2021)   Social Connection and Isolation Panel [NHANES]    Frequency of Communication with Friends and Family: More than three times a week    Frequency of Social Gatherings with Friends and Family: More than three times a week    Attends Religious Services: More than 4 times per year    Active Member of [employer] or Organizations: No    Attends Archivist Meetings: Never    Marital Status: Widowed  Intimate Partner Violence: Not At Risk (09/22/2021)   Humiliation, Afraid, Rape, and Kick questionnaire    Fear of Current or Ex-Partner: No    Emotionally Abused: No    Physically Abused: No    Sexually Abused: No    FAMILY HISTORY: Family History  Problem Relation Age of Onset   Hypertension Mother    Stroke Mother    Diabetes Daughter    Stroke Father    Healthy Sister    Hypertension Maternal Grandmother     ALLERGIES:  is  allergic to ace inhibitors.  MEDICATIONS:  Current Outpatient Medications  Medication Sig Dispense Refill   ACCU-CHEK GUIDE test strip USE  AS  INSTRUCTED  DAILY 100 strip 2   Accu-Chek Softclix Lancets lancets USE AS DIRECTED EVERY DAY 100 each 2   Alcohol Swabs (DROPSAFE ALCOHOL PREP) 70 % PADS USE AS DIRECTED THREE TIMES DAILY 300 each 0   amiodarone (PACERONE) 200 MG tablet Take 100 mg by mouth daily.     amLODipine-valsartan (EXFORGE) 5-160 MG tablet Take 1 tablet by mouth daily. 90 tablet 1   aspirin 81 MG tablet Take 81 mg by mouth daily.     atorvastatin (LIPITOR) 80 MG tablet TAKE 1 TABLET EVERY DAY 90 tablet 1   Blood Glucose Monitoring Suppl (ACCU-CHEK GUIDE ME) w/Device KIT 1 each by Does not apply route daily. 1 kit 0   cholecalciferol (VITAMIN D) 1000 UNITS tablet Take 1,000 Units by mouth daily.      folic acid (FOLVITE) 465 MCG tablet Take 400 mcg by mouth daily.     hydrochlorothiazide (HYDRODIURIL) 12.5 MG tablet Take 1 tablet (12.5 mg total) by mouth daily. 90 tablet 1   Multiple Vitamins-Minerals (CENTRUM SILVER 50+WOMEN PO) Take 1 tablet by mouth daily.     No current facility-administered medications for this visit.     PHYSICAL EXAMINATION: ECOG PERFORMANCE STATUS: 1 - Symptomatic but completely ambulatory Vitals:   12/26/21 1306  BP: (!) 149/71  Pulse: (!) 59  Resp: 18  Temp: (!) 96 F (35.6 C)  SpO2: 98%   Filed Weights   12/26/21 1306  Weight: 111 lb 3.2 oz (50.4 kg)    Physical Exam Constitutional:      General: She is not in acute distress.    Appearance: She is not diaphoretic.     Comments: Thin built   HENT:     Head: Normocephalic and atraumatic.     Nose: Nose normal.     Mouth/Throat:     Pharynx: No oropharyngeal exudate.  Eyes:     General: No scleral icterus.       Left eye: No discharge.     Pupils: Pupils are equal, round, and reactive to light.  Neck:     Vascular: No JVD.  Cardiovascular:     Rate and Rhythm: Normal rate.      Heart sounds: Murmur heard.  No friction rub.  Pulmonary:     Effort: Pulmonary effort is normal. No respiratory distress.     Breath sounds: Normal breath sounds. No wheezing or rales.  Chest:     Chest wall: No tenderness.  Abdominal:     General: Bowel sounds are normal. There is no distension.     Palpations: Abdomen is soft.     Tenderness: There is no abdominal tenderness.  Musculoskeletal:        General: No tenderness. Normal range of motion.     Cervical back: Normal range of motion and neck supple.  Lymphadenopathy:     Cervical: No cervical adenopathy.  Skin:    General: Skin is warm and dry.     Findings: No erythema or rash.  Neurological:     Mental Status: She is alert and oriented to person, place, and time.     Cranial Nerves: No cranial nerve deficit.     Motor: No abnormal muscle tone.     Coordination: Coordination normal.  Psychiatric:        Mood and Affect: Affect normal.        Judgment: Judgment normal.      LABORATORY DATA:  I have reviewed the data as listed    Latest Ref Rng & Units 12/12/2021   11:05 AM 06/08/2021    1:37 PM 12/08/2020   10:52 AM  CBC  WBC 4.0 - 10.5 K/uL 3.6  3.7  4.4   Hemoglobin 12.0 - 15.0 g/dL 11.9  11.7  11.7   Hematocrit 36.0 - 46.0 % 36.4  36.1  36.1   Platelets 150 - 400 K/uL 182  179  217       Latest Ref Rng & Units 09/19/2021   10:08 AM 12/08/2020   10:52 AM 08/20/2020    8:33 AM  CMP  Glucose 65 - 99 mg/dL 114  126  108   BUN 7 - 25 mg/dL _0 Creatinine 0.60 - 0.95 mg/dL 1.07  1.02  0.68   Sodium 135 - 146 mmol/L 139  137  139   Potassium 3.5 - 5.3 mmol/L 4.6  3.9  4.3   Chloride 98 - 110 mmol/L 103  101  102   CO2 20 - 32 mmol/L _1 Calcium 8.6 - 10.4 mg/dL 8.9  8.7  9.3   Total Protein 6.1 - 8.1 g/dL 6.8  7.0  6.5   Total Bilirubin 0.2 - 1.2 mg/dL 0.5  0.3  0.5   Alkaline Phos 38 - 126 U/L  57    AST 10 - 35 U/L _2 ALT 6 - 29 U/L _3 Lab Results   Component Value Date   IRON 78 12/12/2021   TIBC 291 12/12/2021   FERRITIN 100 12/12/2021     SPEP not observed M spike.

## 2022-01-02 ENCOUNTER — Ambulatory Visit
Admission: RE | Admit: 2022-01-02 | Discharge: 2022-01-02 | Disposition: A | Discharge status: Home / Self Care | Payer: Medicare HMO | Source: Ambulatory Visit | Attending: Family Medicine | Admitting: Family Medicine

## 2022-01-02 DIAGNOSIS — M8589 Other specified disorders of bone density and structure, multiple sites: Secondary | ICD-10-CM | POA: Insufficient documentation

## 2022-01-02 DIAGNOSIS — M85832 Other specified disorders of bone density and structure, left forearm: Secondary | ICD-10-CM | POA: Diagnosis not present

## 2022-01-02 DIAGNOSIS — M85852 Other specified disorders of bone density and structure, left thigh: Secondary | ICD-10-CM | POA: Diagnosis not present

## 2022-02-14 DIAGNOSIS — I495 Sick sinus syndrome: Secondary | ICD-10-CM | POA: Diagnosis not present

## 2022-02-21 ENCOUNTER — Other Ambulatory Visit: Payer: Self-pay | Admitting: Family Medicine

## 2022-02-21 DIAGNOSIS — N183 Chronic kidney disease, stage 3 unspecified: Secondary | ICD-10-CM

## 2022-03-08 DIAGNOSIS — R0789 Other chest pain: Secondary | ICD-10-CM | POA: Diagnosis not present

## 2022-03-08 DIAGNOSIS — R011 Cardiac murmur, unspecified: Secondary | ICD-10-CM | POA: Diagnosis not present

## 2022-03-08 DIAGNOSIS — R079 Chest pain, unspecified: Secondary | ICD-10-CM | POA: Diagnosis not present

## 2022-03-08 DIAGNOSIS — I495 Sick sinus syndrome: Secondary | ICD-10-CM | POA: Diagnosis not present

## 2022-03-08 DIAGNOSIS — Z01818 Encounter for other preprocedural examination: Secondary | ICD-10-CM | POA: Diagnosis not present

## 2022-03-15 ENCOUNTER — Encounter: Payer: Self-pay | Admitting: Oncology

## 2022-03-21 NOTE — Progress Notes (Unsigned)
Name: Emma Campbell   MRN: [id number]    DOB: [birth year]   Date:03/22/2022       Progress Note  Subjective  Chief Complaint  Follow Up  HPI  DMII with renal manifestation: CKI and microalbuminuria. Diagnosed many years ago. A1C has been well controlled, it was 6.1 %  6.5 % 5.9 % 6 % 6.4 % and today is 6.8 %  she has been off Metformin and still doing well.  Glucose at home still controlled , in the low 100s fasting . She denies polyphagia, polydipsia or polyuria . She denies hypoglycemic episodes.Continue ARB for kidney protection and also for HTN . Eye exam is up to date. She is on statin therapy for dyslipidemia and last LDL at goal   CKI stage III: she is on ARB ( Exforge ) bp has been well controlled, DM also controlled , no pruritis and she has normal urine output. Last GFR was stable at 50   HTN: taking medication daily and bp is at goal, no dizziness, chest pain or palpitation. She is only on Exforge off HCTZ and bp is at goal    Hyperlipidemia: she has been on Atorvastatin  80 mg daily . Last LDL showed increase of LDL from 70 to 84. We decided to continue current dose and recheck next visit    Sick Sinus Syndrome: she had a pacemaker placed in 2006 and has been on Amiodarone since 2006, she is now down to 100 mg of Amiodarone daily, last TSH still normal.  No chest pain, dizziness  or palpitation. Sees Dr. Clayborn Bigness , she is [name] score of 2, she is now off aspirin and only taking eliquis 2.5 mg , she will have a pacemaker replacement January 2024  She also has a history of Afib that has been rate controlled. She  has a history of AVM's  she denies GI bleed    Vitamin D deficiency: still taking supplementation, denies fatigue, last Vitamin D was normal  . Unchanged    Osteopenia: last FRAX 09/2019 12/2021 still has low FRAX score, continue high calcium diet, physical activity and vitamin D supplementation    Anemia: iron deficiency and positive hemoccult, seen by Dr. Durwin Reges and had EGD and  colonoscopy September 2018,diagnosed with AVM small bowel and had it cauterized, hemoglobin dropped and was referred to Dr. Tasia Catchings, She is currently taking iron twice a week and B12 three times a week. Feeling well   Patient Active Problem List   Diagnosis Date Noted   Anemia in chronic kidney disease (CKD) 12/26/2021   Osteopenia of multiple sites 09/19/2021   B12 deficiency 05/21/2017   Gastrointestinal tract imaging abnormality    Iron deficiency anemia    Polyp of sigmoid colon    Arthritis, degenerative 05/03/2015   Menopausal and perimenopausal disorder 05/03/2015   Chronic kidney disease (CKD), stage III (moderate) (HCC) 11/30/2014   Diverticulosis 11/30/2014   Fibrocystic breast disease 11/30/2014   Elevated LFTs 40/98/1191   Systolic ejection murmur 47/82/9562   Paroxysmal A-fib ([place]) 11/30/2014   Microalbuminuria 07/31/2014   Elevated TSH 07/31/2014   Pacemaker 07/31/2014   Hypercholesteremia 08/13/2013   Hypertension, benign 08/13/2013   Type 2 diabetes mellitus with stage 3 chronic kidney disease and hypertension ([place]) 08/13/2013   Vitamin D deficiency 04/12/2009    Past Surgical History:  Procedure Laterality Date   CATARACT EXTRACTION W/PHACO Right 01/07/2019   Procedure: CATARACT EXTRACTION PHACO AND INTRAOCULAR LENS PLACEMENT (IOC) RIGHT DIABETIC 02:15.1  25.7%        34.69;  Surgeon: Birder Robson, MD;  Location: [place];  Service: Ophthalmology;  Laterality: Right;  Diabetic - oral meds   CATARACT EXTRACTION W/PHACO Left 01/28/2019   Procedure: CATARACT EXTRACTION PHACO AND INTRAOCULAR LENS PLACEMENT (IOC) LEFT DIABETIC 12.96  01:15.5;  Surgeon: Birder Robson, MD;  Location: [place];  Service: Ophthalmology;  Laterality: Left;  Diabetic - oral meds   COLONOSCOPY WITH PROPOFOL N/A 11/06/2016   Procedure: COLONOSCOPY WITH PROPOFOL;  Surgeon: Lucilla Lame, MD;  Location: [place];  Service: Endoscopy;  Laterality: N/A;   DIABETIC   ESOPHAGOGASTRODUODENOSCOPY (EGD) WITH PROPOFOL N/A 11/06/2016   Procedure: ESOPHAGOGASTRODUODENOSCOPY (EGD) WITH PROPOFOL;  Surgeon: Lucilla Lame, MD;  Location: [place];  Service: Endoscopy;  Laterality: N/A;   ESOPHAGOGASTRODUODENOSCOPY (EGD) WITH PROPOFOL N/A 01/23/2017   Procedure: ESOPHAGOGASTRODUODENOSCOPY (EGD) WITH PROPOFOL with PUSH;  Surgeon: Lucilla Lame, MD;  Location: [facility] ENDOSCOPY;  Service: Endoscopy;  Laterality: N/A;   GIVENS CAPSULE STUDY N/A 12/22/2016   Procedure: GIVENS CAPSULE STUDY;  Surgeon: Lucilla Lame, MD;  Location: [facility] ENDOSCOPY;  Service: Endoscopy;  Laterality: N/A;   INSERT / REPLACE / REMOVE PACEMAKER     PACEMAKER INSERTION  2006   POLYPECTOMY  11/06/2016   Procedure: POLYPECTOMY;  Surgeon: Lucilla Lame, MD;  Location: [place];  Service: Endoscopy;;    Family History  Problem Relation Age of Onset   Hypertension Mother    Stroke Mother    Diabetes Daughter    Stroke Father    Healthy Sister    Hypertension Maternal Grandmother     Social History   Tobacco Use   Smoking status: Never   Smokeless tobacco: Never   Tobacco comments:    smoking cessation materials not required  Substance Use Topics   Alcohol use: No    Alcohol/week: 0.0 standard drinks of alcohol     Current Outpatient Medications:    ACCU-CHEK GUIDE test strip, USE  AS  INSTRUCTED  DAILY, Disp: 100 strip, Rfl: 2   Accu-Chek Softclix Lancets lancets, USE AS DIRECTED EVERY DAY, Disp: 100 each, Rfl: 2   Alcohol Swabs (DROPSAFE ALCOHOL PREP) 70 % PADS, USE AS DIRECTED THREE TIMES DAILY, Disp: 300 each, Rfl: 0   amiodarone (PACERONE) 200 MG tablet, Take 100 mg by mouth daily., Disp: , Rfl:    apixaban (ELIQUIS) 2.5 MG TABS tablet, Take 2.5 mg by mouth daily., Disp: , Rfl:    Blood Glucose Monitoring Suppl (ACCU-CHEK GUIDE ME) w/Device KIT, 1 each by Does not apply route daily., Disp: 1 kit, Rfl: 0   cholecalciferol (VITAMIN D) 1000 UNITS tablet,  Take 1,000 Units by mouth daily. , Disp: , Rfl:    folic acid (FOLVITE) 694 MCG tablet, Take 400 mcg by mouth daily., Disp: , Rfl:    Multiple Vitamins-Minerals (CENTRUM SILVER 50+WOMEN PO), Take 1 tablet by mouth daily., Disp: , Rfl:    amLODipine-valsartan (EXFORGE) 5-160 MG tablet, Take 1 tablet by mouth daily., Disp: 90 tablet, Rfl: 0   atorvastatin (LIPITOR) 80 MG tablet, Take 1 tablet (80 mg total) by mouth daily., Disp: 90 tablet, Rfl: 1  Allergies  Allergen Reactions   Ace Inhibitors Other (See Comments)    Pt unable to recall. Was advised to avoid    I personally reviewed active problem list, medication list, allergies, family history, social history, health maintenance with the patient/caregiver today.   ROS  Constitutional: Negative for fever or weight change.  Respiratory: Negative for cough and shortness of breath.   Cardiovascular: Negative for chest pain or palpitations.  Gastrointestinal: Negative for abdominal pain, no bowel changes.  Musculoskeletal: Negative for gait problem or joint swelling.  Skin: Negative for rash.  Neurological: Negative for dizziness or headache.  No other specific complaints in a complete review of systems (except as listed in HPI above).   Objective  Vitals:   03/22/22 0841  BP: 122/76  Pulse: 68  Resp: 12  Temp: 97.7 F (36.5 C)  TempSrc: Oral  SpO2: 99%  Weight: 109 lb 14.4 oz (49.9 kg)  Height: '5\' 1"'$  (1.549 m)    Body mass index is 20.77 kg/m.  Physical Exam  Constitutional: Patient appears well-developed and well-nourished.  No distress.  HEENT: head atraumatic, normocephalic, pupils equal and reactive to light, neck supple Cardiovascular: Normal rate, regular rhythm and normal heart sounds.  No murmur heard. No BLE edema. Pulmonary/Chest: Effort normal and breath sounds normal. No respiratory distress. Abdominal: Soft.  There is no tenderness. Psychiatric: Patient has a normal mood and affect. behavior is normal.  Judgment and thought content normal.   Recent Results (from the past 2160 hour(s))  POCT HgB A1C     Status: Abnormal   Collection Time: 03/22/22  8:44 AM  Result Value Ref Range   Hemoglobin A1C 6.7 (A) 4.0 - 5.6 %   HbA1c POC (<> result, manual entry)     HbA1c, POC (prediabetic range)     HbA1c, POC (controlled diabetic range)      Diabetic Foot Exam: Diabetic Foot Exam - Simple   Simple Foot Form Visual Inspection See comments: Yes Sensation Testing Intact to touch and monofilament testing bilaterally: Yes Pulse Check Posterior Tibialis and Dorsalis pulse intact bilaterally: Yes Comments Thick toenails       PHQ2/9:    03/22/2022    8:43 AM 09/22/2021    1:00 PM 09/19/2021    9:38 AM 03/21/2021    9:38 AM 11/18/2020   10:02 AM  Depression screen PHQ 2/9  Decreased Interest 0 0 0 0 0  Down, Depressed, Hopeless 0 0 0 0 0  PHQ - 2 Score 0 0 0 0 0  Altered sleeping 0 0 0 0   Tired, decreased energy 0 0 0 0   Change in appetite 0 0 0 0   Feeling bad or failure about yourself  0 0 0 0   Trouble concentrating 0 0 0 0   Moving slowly or fidgety/restless 0 0 0 0   Suicidal thoughts 0 0 0 0   PHQ-9 Score 0 0 0 0     phq 9 is negative   Fall Risk:    03/22/2022    8:43 AM 09/22/2021    1:03 PM 09/19/2021    9:38 AM 03/21/2021    9:38 AM 11/18/2020   10:02 AM  Fall Risk   Falls in the past year? 0 0 0 0 0  Number falls in past yr:   0 0   Injury with Fall?   0 0   Risk for fall due to : No Fall Risks No Fall Risks No Fall Risks No Fall Risks   Follow up Falls prevention discussed;Education provided;Falls evaluation completed Falls prevention discussed;Education provided;Falls evaluation completed Falls prevention discussed Falls prevention discussed Falls prevention discussed      Functional Status Survey: Is the patient deaf or have difficulty hearing?: No Does the patient have difficulty seeing, even when wearing glasses/contacts?: No  Does the patient have  difficulty concentrating, remembering, or making decisions?: No Does the patient have difficulty walking or climbing stairs?: No Does the patient have difficulty dressing or bathing?: No Does the patient have difficulty doing errands alone such as visiting a doctor's office or shopping?: No    Assessment & Plan  1. Controlled type 2 diabetes mellitus with stage 3 chronic kidney disease, without long-term current use of insulin (HCC)  - POCT HgB A1C - HM Diabetes Foot Exam - amLODipine-valsartan (EXFORGE) 5-160 MG tablet; Take 1 tablet by mouth daily.  Dispense: 90 tablet; Refill: 0  2. Dyslipidemia associated with type 2 diabetes mellitus (HCC)  - atorvastatin (LIPITOR) 80 MG tablet; Take 1 tablet (80 mg total) by mouth daily.  Dispense: 90 tablet; Refill: 1  3. Paroxysmal A-fib (HCC)  Rate controlled   4. Vitamin D deficiency  On supplementation   5. B12 deficiency  On supplementation  6. Osteopenia of multiple sites   7. Hypertension, benign   8. Pacemaker   9. Iron deficiency anemia due to chronic blood loss  Under the care of Dr. Tasia Catchings

## 2022-03-22 ENCOUNTER — Encounter: Payer: Self-pay | Admitting: Family Medicine

## 2022-03-22 ENCOUNTER — Ambulatory Visit (INDEPENDENT_AMBULATORY_CARE_PROVIDER_SITE_OTHER): Payer: 59 | Admitting: Family Medicine

## 2022-03-22 VITALS — BP 122/76 | HR 68 | Temp 97.7°F | Resp 12 | Ht 61.0 in | Wt 109.9 lb

## 2022-03-22 DIAGNOSIS — E785 Hyperlipidemia, unspecified: Secondary | ICD-10-CM

## 2022-03-22 DIAGNOSIS — D500 Iron deficiency anemia secondary to blood loss (chronic): Secondary | ICD-10-CM | POA: Diagnosis not present

## 2022-03-22 DIAGNOSIS — I10 Essential (primary) hypertension: Secondary | ICD-10-CM | POA: Diagnosis not present

## 2022-03-22 DIAGNOSIS — E1122 Type 2 diabetes mellitus with diabetic chronic kidney disease: Secondary | ICD-10-CM | POA: Diagnosis not present

## 2022-03-22 DIAGNOSIS — E538 Deficiency of other specified B group vitamins: Secondary | ICD-10-CM

## 2022-03-22 DIAGNOSIS — Z950 Presence of cardiac pacemaker: Secondary | ICD-10-CM

## 2022-03-22 DIAGNOSIS — N183 Chronic kidney disease, stage 3 unspecified: Secondary | ICD-10-CM | POA: Diagnosis not present

## 2022-03-22 DIAGNOSIS — M8589 Other specified disorders of bone density and structure, multiple sites: Secondary | ICD-10-CM | POA: Diagnosis not present

## 2022-03-22 DIAGNOSIS — E1169 Type 2 diabetes mellitus with other specified complication: Secondary | ICD-10-CM

## 2022-03-22 DIAGNOSIS — E559 Vitamin D deficiency, unspecified: Secondary | ICD-10-CM

## 2022-03-22 DIAGNOSIS — I480 Paroxysmal atrial fibrillation: Secondary | ICD-10-CM | POA: Diagnosis not present

## 2022-03-22 LAB — POCT GLYCOSYLATED HEMOGLOBIN (HGB A1C): Hemoglobin A1C: 6.7 % — AB (ref 4.0–5.6)

## 2022-03-22 MED ORDER — ATORVASTATIN CALCIUM 80 MG PO TABS: 80.0000 mg | ORAL_TABLET | Freq: Every day | ORAL | 1 refills | Status: DC

## 2022-03-22 MED ORDER — AMLODIPINE BESYLATE-VALSARTAN 5-160 MG PO TABS: 1.0000 | ORAL_TABLET | Freq: Every day | ORAL | 0 refills | Status: DC

## 2022-03-27 ENCOUNTER — Encounter: Payer: Self-pay | Admitting: Oncology

## 2022-03-29 MED ORDER — SODIUM CHLORIDE 0.9 % IV SOLN
80.0000 mg | INTRAVENOUS | Status: DC
  Filled 2022-03-29 (×2): qty 2

## 2022-03-30 ENCOUNTER — Encounter
Admission: RE | Disposition: A | Discharge status: Home / Self Care | Payer: Self-pay | Source: Home / Self Care | Attending: Cardiology

## 2022-03-30 ENCOUNTER — Other Ambulatory Visit: Payer: Self-pay

## 2022-03-30 ENCOUNTER — Encounter: Payer: Self-pay | Admitting: Cardiology

## 2022-03-30 ENCOUNTER — Ambulatory Visit
Admission: RE | Admit: 2022-03-30 | Discharge: 2022-03-30 | Disposition: A | Discharge status: Home / Self Care | Payer: 59 | Attending: Cardiology | Admitting: Cardiology

## 2022-03-30 DIAGNOSIS — Z45010 Encounter for checking and testing of cardiac pacemaker pulse generator [battery]: Secondary | ICD-10-CM | POA: Diagnosis not present

## 2022-03-30 DIAGNOSIS — Z01818 Encounter for other preprocedural examination: Secondary | ICD-10-CM

## 2022-03-30 DIAGNOSIS — R001 Bradycardia, unspecified: Secondary | ICD-10-CM | POA: Diagnosis not present

## 2022-03-30 HISTORY — PX: PPM GENERATOR CHANGEOUT: EP1233

## 2022-03-30 LAB — GLUCOSE, CAPILLARY
Glucose-Capillary: 125 mg/dL — ABNORMAL HIGH (ref 70–99)
Glucose-Capillary: 121 mg/dL — ABNORMAL HIGH (ref 70–99)

## 2022-03-30 SURGERY — PPM GENERATOR CHANGEOUT
Anesthesia: Moderate Sedation

## 2022-03-30 MED ORDER — CEFAZOLIN SODIUM-DEXTROSE 2-4 GM/100ML-% IV SOLN
INTRAVENOUS | Status: AC
  Filled 2022-03-30: qty 100

## 2022-03-30 MED ORDER — CEFAZOLIN SODIUM-DEXTROSE 2-4 GM/100ML-% IV SOLN: 2.0000 g | INTRAVENOUS | Status: DC

## 2022-03-30 MED ORDER — ONDANSETRON HCL 4 MG/2ML IJ SOLN: 4.0000 mg | Freq: Four times a day (QID) | INTRAMUSCULAR | Status: DC | PRN

## 2022-03-30 MED ORDER — CEPHALEXIN 250 MG PO CAPS: 500.0000 mg | ORAL_CAPSULE | Freq: Two times a day (BID) | ORAL | 0 refills | Status: DC

## 2022-03-30 MED ORDER — LIDOCAINE HCL 1 % IJ SOLN
INTRAMUSCULAR | Status: AC
  Filled 2022-03-30: qty 40

## 2022-03-30 MED ORDER — MIDAZOLAM HCL 2 MG/2ML IJ SOLN
INTRAMUSCULAR | Status: AC
  Filled 2022-03-30: qty 2

## 2022-03-30 MED ORDER — LIDOCAINE HCL (PF) 1 % IJ SOLN
INTRAMUSCULAR | Status: DC | PRN
  Administered 2022-03-30: 30 mL

## 2022-03-30 MED ORDER — SODIUM CHLORIDE 0.9 % IV SOLN: INTRAVENOUS | Status: DC

## 2022-03-30 MED ORDER — CEFAZOLIN SODIUM-DEXTROSE 1-4 GM/50ML-% IV SOLN
INTRAVENOUS | Status: DC | PRN
  Administered 2022-03-30: 2 g via INTRAVENOUS

## 2022-03-30 MED ORDER — ACETAMINOPHEN 325 MG PO TABS: 325.0000 mg | ORAL_TABLET | ORAL | Status: DC | PRN

## 2022-03-30 MED ORDER — FENTANYL CITRATE (PF) 100 MCG/2ML IJ SOLN
INTRAMUSCULAR | Status: AC
  Filled 2022-03-30: qty 2

## 2022-03-30 MED ORDER — FENTANYL CITRATE (PF) 100 MCG/2ML IJ SOLN
INTRAMUSCULAR | Status: DC | PRN
  Administered 2022-03-30 (×2): 25 ug via INTRAVENOUS

## 2022-03-30 MED ORDER — MIDAZOLAM HCL 2 MG/2ML IJ SOLN
INTRAMUSCULAR | Status: DC | PRN
  Administered 2022-03-30: 1 mg via INTRAVENOUS

## 2022-03-30 MED ORDER — SODIUM CHLORIDE 0.9 % IV SOLN
INTRAVENOUS | Status: DC | PRN
  Administered 2022-03-30: 80 mg

## 2022-03-30 SURGICAL SUPPLY — 16 items
CABLE SURG 12 DISP A/V CHANNEL (MISCELLANEOUS) IMPLANT
DEVICE DSSCT PLSMBLD 3.0S LGHT (MISCELLANEOUS) IMPLANT
DRAPE INCISE 23X17 IOBAN STRL (DRAPES) ×1
DRAPE INCISE 23X17 STRL (DRAPES) IMPLANT
DRAPE INCISE IOBAN 23X17 STRL (DRAPES) ×1 IMPLANT
IPG PACE AZUR XT DR MRI W1DR01 (Pacemaker) IMPLANT
PACE AZURE XT DR MRI W1DR01 (Pacemaker) ×1 IMPLANT
PAD ELECT DEFIB RADIOL ZOLL (MISCELLANEOUS) IMPLANT
PLASMABLADE 3.0S W/LIGHT (MISCELLANEOUS) ×1
POUCH AIGIS-R ANTIBACT PPM (Mesh General) ×1 IMPLANT
POUCH AIGIS-R ANTIBACT PPM MED (Mesh General) IMPLANT
SUT VIC AB 2-0 CT2 27 (SUTURE) IMPLANT
SUT VICRYL 4-0  27 PS-2 BARIAT (SUTURE) ×1
SUT VICRYL 4-0 27 PS-2 BARIAT (SUTURE) ×1
SUTURE VICRYL 4-0 27 PS-2 BART (SUTURE) IMPLANT
TRAY PACEMAKER INSERTION (PACKS) ×1 IMPLANT

## 2022-03-30 NOTE — Discharge Instructions (Addendum)
Patient may shower 04/01/2022 and may remove outer bandage.  Leave Steri-Strips on. Start Antibiotic today per MD.  Resume Eliquis on Friday AM 03/31/2022.

## 2022-03-31 ENCOUNTER — Encounter: Payer: Self-pay | Admitting: Cardiology

## 2022-04-07 DIAGNOSIS — R001 Bradycardia, unspecified: Secondary | ICD-10-CM | POA: Diagnosis not present

## 2022-04-07 DIAGNOSIS — R079 Chest pain, unspecified: Secondary | ICD-10-CM | POA: Diagnosis not present

## 2022-04-07 DIAGNOSIS — Z950 Presence of cardiac pacemaker: Secondary | ICD-10-CM | POA: Diagnosis not present

## 2022-04-07 DIAGNOSIS — R011 Cardiac murmur, unspecified: Secondary | ICD-10-CM | POA: Diagnosis not present

## 2022-04-07 DIAGNOSIS — Z8679 Personal history of other diseases of the circulatory system: Secondary | ICD-10-CM | POA: Diagnosis not present

## 2022-04-07 DIAGNOSIS — I10 Essential (primary) hypertension: Secondary | ICD-10-CM | POA: Diagnosis not present

## 2022-04-07 DIAGNOSIS — I495 Sick sinus syndrome: Secondary | ICD-10-CM | POA: Diagnosis not present

## 2022-06-06 ENCOUNTER — Telehealth: Payer: Self-pay | Admitting: Family Medicine

## 2022-06-06 NOTE — Telephone Encounter (Signed)
Contacted Emma Campbell to schedule their annual wellness visit. Appointment made for 09/28/2022.  [facility] Care Guide [facility] AWV TEAM Direct Dial: [phone]

## 2022-06-11 ENCOUNTER — Other Ambulatory Visit: Payer: Self-pay | Admitting: Family Medicine

## 2022-06-11 DIAGNOSIS — E1122 Type 2 diabetes mellitus with diabetic chronic kidney disease: Secondary | ICD-10-CM

## 2022-06-12 ENCOUNTER — Other Ambulatory Visit: Payer: Self-pay

## 2022-06-12 DIAGNOSIS — E1122 Type 2 diabetes mellitus with diabetic chronic kidney disease: Secondary | ICD-10-CM

## 2022-06-12 MED ORDER — AMLODIPINE BESYLATE-VALSARTAN 5-160 MG PO TABS: 1.0000 | ORAL_TABLET | Freq: Every day | ORAL | 0 refills | Status: DC

## 2022-06-18 ENCOUNTER — Other Ambulatory Visit: Payer: Self-pay | Admitting: Family Medicine

## 2022-06-18 DIAGNOSIS — E1169 Type 2 diabetes mellitus with other specified complication: Secondary | ICD-10-CM

## 2022-06-27 ENCOUNTER — Inpatient Hospital Stay: Payer: 59 | Attending: Oncology

## 2022-06-27 DIAGNOSIS — D631 Anemia in chronic kidney disease: Secondary | ICD-10-CM | POA: Diagnosis not present

## 2022-06-27 DIAGNOSIS — Z79899 Other long term (current) drug therapy: Secondary | ICD-10-CM | POA: Diagnosis not present

## 2022-06-27 DIAGNOSIS — I129 Hypertensive chronic kidney disease with stage 1 through stage 4 chronic kidney disease, or unspecified chronic kidney disease: Secondary | ICD-10-CM | POA: Diagnosis not present

## 2022-06-27 DIAGNOSIS — N183 Chronic kidney disease, stage 3 unspecified: Secondary | ICD-10-CM | POA: Insufficient documentation

## 2022-06-27 DIAGNOSIS — E1122 Type 2 diabetes mellitus with diabetic chronic kidney disease: Secondary | ICD-10-CM | POA: Insufficient documentation

## 2022-06-27 LAB — CBC WITH DIFFERENTIAL/PLATELET
Abs Immature Granulocytes: 0 10*3/uL (ref 0.00–0.07)
Basophils Absolute: 0 10*3/uL (ref 0.0–0.1)
Basophils Relative: 1 %
Eosinophils Absolute: 0.2 10*3/uL (ref 0.0–0.5)
Eosinophils Relative: 5 %
HCT: 37.9 % (ref 36.0–46.0)
Hemoglobin: 11.9 g/dL — ABNORMAL LOW (ref 12.0–15.0)
Immature Granulocytes: 0 %
Lymphocytes Relative: 45 %
Lymphs Abs: 1.6 10*3/uL (ref 0.7–4.0)
MCH: 29.1 pg (ref 26.0–34.0)
MCHC: 31.4 g/dL (ref 30.0–36.0)
MCV: 92.7 fL (ref 80.0–100.0)
Monocytes Absolute: 0.3 10*3/uL (ref 0.1–1.0)
Monocytes Relative: 7 %
Neutro Abs: 1.5 10*3/uL — ABNORMAL LOW (ref 1.7–7.7)
Neutrophils Relative %: 42 %
Platelets: 174 10*3/uL (ref 150–400)
RBC: 4.09 MIL/uL (ref 3.87–5.11)
RDW: 13.1 % (ref 11.5–15.5)
WBC: 3.6 10*3/uL — ABNORMAL LOW (ref 4.0–10.5)
nRBC: 0 % (ref 0.0–0.2)

## 2022-06-27 LAB — RETIC PANEL
Immature Retic Fract: 5.2 % (ref 2.3–15.9)
RBC.: 4.09 MIL/uL (ref 3.87–5.11)
Retic Count, Absolute: 47.4 10*3/uL (ref 19.0–186.0)
Retic Ct Pct: 1.2 % (ref 0.4–3.1)
Reticulocyte Hemoglobin: 32.7 pg (ref >27.9)

## 2022-06-27 LAB — IRON AND TIBC
Iron: 100 ug/dL (ref 28–170)
Saturation Ratios: 33 % — ABNORMAL HIGH (ref 10.4–31.8)
TIBC: 300 ug/dL (ref 250–450)
UIBC: 200 ug/dL

## 2022-06-27 LAB — FERRITIN: Ferritin: 141 ng/mL (ref 11–307)

## 2022-07-04 ENCOUNTER — Encounter: Payer: Self-pay | Admitting: Oncology

## 2022-07-04 ENCOUNTER — Inpatient Hospital Stay: Payer: 59 | Attending: Oncology | Admitting: Oncology

## 2022-07-04 VITALS — BP 137/67 | HR 72 | Temp 97.2°F | Resp 18 | Wt 112.7 lb

## 2022-07-04 DIAGNOSIS — Z833 Family history of diabetes mellitus: Secondary | ICD-10-CM | POA: Diagnosis not present

## 2022-07-04 DIAGNOSIS — Z7901 Long term (current) use of anticoagulants: Secondary | ICD-10-CM | POA: Diagnosis not present

## 2022-07-04 DIAGNOSIS — N189 Chronic kidney disease, unspecified: Secondary | ICD-10-CM

## 2022-07-04 DIAGNOSIS — Z8249 Family history of ischemic heart disease and other diseases of the circulatory system: Secondary | ICD-10-CM | POA: Insufficient documentation

## 2022-07-04 DIAGNOSIS — E611 Iron deficiency: Secondary | ICD-10-CM | POA: Insufficient documentation

## 2022-07-04 DIAGNOSIS — E538 Deficiency of other specified B group vitamins: Secondary | ICD-10-CM | POA: Insufficient documentation

## 2022-07-04 DIAGNOSIS — Z823 Family history of stroke: Secondary | ICD-10-CM | POA: Insufficient documentation

## 2022-07-04 DIAGNOSIS — D631 Anemia in chronic kidney disease: Secondary | ICD-10-CM | POA: Insufficient documentation

## 2022-07-04 DIAGNOSIS — I4891 Unspecified atrial fibrillation: Secondary | ICD-10-CM | POA: Insufficient documentation

## 2022-07-04 DIAGNOSIS — N183 Chronic kidney disease, stage 3 unspecified: Secondary | ICD-10-CM | POA: Insufficient documentation

## 2022-07-04 DIAGNOSIS — Z79899 Other long term (current) drug therapy: Secondary | ICD-10-CM | POA: Diagnosis not present

## 2022-07-04 DIAGNOSIS — I129 Hypertensive chronic kidney disease with stage 1 through stage 4 chronic kidney disease, or unspecified chronic kidney disease: Secondary | ICD-10-CM | POA: Insufficient documentation

## 2022-07-04 NOTE — Assessment & Plan Note (Addendum)
Continue vitamin B12 3-4 times per week.

## 2022-07-04 NOTE — Progress Notes (Signed)
Pt here for follow up. No new concerns voiced.   

## 2022-07-04 NOTE — Progress Notes (Signed)
Hematology/Oncology Progress note [phone] [phone]      Patient Care Team: Alba Cory, MD as PCP - General (Family Medicine) Alwyn Pea, MD as Consulting Physician (Cardiology) Rickard Patience, MD as Consulting Physician (Oncology) Gaspar Cola, [facility] as Pharmacist (Pharmacist)  ASSESSMENT & PLAN:   Anemia in chronic kidney disease (CKD) # Iron deficiency anemia, Labs reviewed and discussed with patient Hemoglobin >10  No need for erythropoietin replacement therapy.   Iron panel is stable. Continue oral iron supplementation 2 time per week.   B12 deficiency B12 level is at low normal end.  Recommend vitamin B12 3-4 times per week.   Orders Placed This Encounter  Procedures   CBC with Differential (Cancer Center Only)    Standing Status:   Future    Standing Expiration Date:   07/04/2023   CMP (Cancer Center only)    Standing Status:   Future    Standing Expiration Date:   07/04/2023   Iron and TIBC    Standing Status:   Future    Standing Expiration Date:   07/04/2023   Ferritin    Standing Status:   Future    Standing Expiration Date:   07/04/2023   Retic Panel    Standing Status:   Future    Standing Expiration Date:   07/04/2023   Vitamin B12    Standing Status:   Future    Standing Expiration Date:   07/04/2023   Follow up in 6 months.  All questions were answered. The patient knows to call the clinic with any problems, questions or concerns.  Rickard Patience, MD, PhD [facility] Hematology Oncology 07/04/2022   REASON FOR VISIT Follow up for treatment of anemia  HISTORY OF PRESENTING ILLNESS:  Emma Campbell is a  89 y.o.  female with PMH listed below who was referred to me for evaluation of anemia.  Patient follows up with primary care physician and has had labs done recently. 04/17/17 CBC showed hemoglobin 9, MCV 81.9, platelet 245,000, WBC 4.1, normal differential.  Iron panel showed TIBC 442, ferritin 11, saturation  15%. Previous GI workup:  # 01/23/2017 EGD showed non bleeding angiotecsia, treated with APC. 12/22/2016 Capsule study showed duodenum AVM.  11/06/2016 colonoscopy showed non bleeding hemorroids, sigmoid polyp, and diverticulosis.   INTERVAL HISTORY Emma Campbell is a 89 y.o. female who has above history reviewed by me today presents for follow up for management of iron deficiency anemia.  Patient takes oral iron supplementation every other day, and B12 supplementation 3-4 time per week. Sh takes Eliquis 2.5mg  BID for A fib. No bleeding events.  No new complaints.she feels well.   Review of Systems  Constitutional:  Negative for chills, fever, malaise/fatigue and weight loss.  HENT:  Negative for sore throat.   Eyes:  Negative for redness.  Respiratory:  Negative for cough, shortness of breath and wheezing.   Cardiovascular:  Negative for chest pain, palpitations and leg swelling.  Gastrointestinal:  Negative for abdominal pain, blood in stool, nausea and vomiting.  Genitourinary:  Negative for dysuria.  Musculoskeletal:  Negative for myalgias.  Skin:  Negative for rash.  Neurological:  Negative for dizziness, tingling and tremors.  Endo/Heme/Allergies:  Does not bruise/bleed easily.  Psychiatric/Behavioral:  Negative for hallucinations.     MEDICAL HISTORY:  Past Medical History:  Diagnosis Date   Anemia    Chronic kidney disease, stage III (moderate) (HCC)    Diverticulosis    Elevated LFTs  Essential hypertension, malignant    Fibrocystic breast disease    Heart murmur    Hyperlipidemia    Menopausal problem    Osteoarthrosis    Osteoporosis    Peripheral autonomic neuropathy    Presence of permanent cardiac pacemaker    Sick sinus syndrome (HCC)    Dr. Leslee Home   Type II diabetes mellitus with renal manifestations (HCC)    Vitamin D deficiency     SURGICAL HISTORY: Past Surgical History:  Procedure Laterality Date   CATARACT EXTRACTION W/PHACO Right  01/07/2019   Procedure: CATARACT EXTRACTION PHACO AND INTRAOCULAR LENS PLACEMENT (IOC) RIGHT DIABETIC 02:15.1          25.7%        34.69;  Surgeon: Galen Manila, MD;  Location: [facility];  Service: Ophthalmology;  Laterality: Right;  Diabetic - oral meds   CATARACT EXTRACTION W/PHACO Left 01/28/2019   Procedure: CATARACT EXTRACTION PHACO AND INTRAOCULAR LENS PLACEMENT (IOC) LEFT DIABETIC 12.96  01:15.5;  Surgeon: Galen Manila, MD;  Location: [facility];  Service: Ophthalmology;  Laterality: Left;  Diabetic - oral meds   COLONOSCOPY WITH PROPOFOL N/A 11/06/2016   Procedure: COLONOSCOPY WITH PROPOFOL;  Surgeon: Midge Minium, MD;  Location: [facility];  Service: Endoscopy;  Laterality: N/A;  DIABETIC   ESOPHAGOGASTRODUODENOSCOPY (EGD) WITH PROPOFOL N/A 11/06/2016   Procedure: ESOPHAGOGASTRODUODENOSCOPY (EGD) WITH PROPOFOL;  Surgeon: Midge Minium, MD;  Location: [facility];  Service: Endoscopy;  Laterality: N/A;   ESOPHAGOGASTRODUODENOSCOPY (EGD) WITH PROPOFOL N/A 01/23/2017   Procedure: ESOPHAGOGASTRODUODENOSCOPY (EGD) WITH PROPOFOL with PUSH;  Surgeon: Midge Minium, MD;  Location: [facility] ENDOSCOPY;  Service: Endoscopy;  Laterality: N/A;   GIVENS CAPSULE STUDY N/A 12/22/2016   Procedure: GIVENS CAPSULE STUDY;  Surgeon: Midge Minium, MD;  Location: [facility] ENDOSCOPY;  Service: Endoscopy;  Laterality: N/A;   INSERT / REPLACE / REMOVE PACEMAKER     PACEMAKER INSERTION  2006   POLYPECTOMY  11/06/2016   Procedure: POLYPECTOMY;  Surgeon: Midge Minium, MD;  Location: [facility];  Service: Endoscopy;;   PPM GENERATOR CHANGEOUT N/A 03/30/2022   Procedure: PPM GENERATOR CHANGEOUT;  Surgeon: Marcina Millard, MD;  Location: [facility] INVASIVE CV LAB;  Service: Cardiovascular;  Laterality: N/A;    SOCIAL HISTORY: Social History   Socioeconomic History   Marital status: Widowed    Spouse name: Onalee Hua   Number of children: 1   Years of education: 2 years of  college   Highest education level: 12th grade  Occupational History   Occupation: Retired Human resources officer at [employer]  Tobacco Use   Smoking status: Never   Smokeless tobacco: Never   Tobacco comments:    smoking cessation materials not required  Vaping Use   Vaping Use: Never used  Substance and Sexual Activity   Alcohol use: No    Alcohol/week: 0.0 standard drinks of alcohol   Drug use: No   Sexual activity: Never  Other Topics Concern   Not on file  Social History Narrative   She still has a house but her daughter and grand-daughter are staying at her house   She moved in with her older sister ( 8 years older), to help her out.    Social Determinants of Health   Financial Resource Strain: Low Risk  (09/22/2021)   Overall Financial Resource Strain (CARDIA)    Difficulty of Paying Living Expenses: Not hard at all  Food Insecurity: No Food Insecurity (09/22/2021)   Hunger Vital Sign    Worried About  Running Out of Food in the Last Year: Never true    Ran Out of Food in the Last Year: Never true  Transportation Needs: No Transportation Needs (09/22/2021)   PRAPARE - Administrator, Civil Service (Medical): No    Lack of Transportation (Non-Medical): No  Physical Activity: Inactive (09/22/2021)   Exercise Vital Sign    Days of Exercise per Week: 0 days    Minutes of Exercise per Session: 0 min  Stress: No Stress Concern Present (09/22/2021)   [employer] of Occupational Health - Occupational Stress Questionnaire    Feeling of Stress : Not at all  Social Connections: Moderately Isolated (09/22/2021)   Social Connection and Isolation Panel [NHANES]    Frequency of Communication with Friends and Family: More than three times a week    Frequency of Social Gatherings with Friends and Family: More than three times a week    Attends Religious Services: More than 4 times per year    Active Member of [employer] or Organizations: No    Attends Banker  Meetings: Never    Marital Status: Widowed  Intimate Partner Violence: Not At Risk (09/22/2021)   Humiliation, Afraid, Rape, and Kick questionnaire    Fear of Current or Ex-Partner: No    Emotionally Abused: No    Physically Abused: No    Sexually Abused: No    FAMILY HISTORY: Family History  Problem Relation Age of Onset   Hypertension Mother    Stroke Mother    Diabetes Daughter    Stroke Father    Healthy Sister    Hypertension Maternal Grandmother     ALLERGIES:  is allergic to ace inhibitors.  MEDICATIONS:  Current Outpatient Medications  Medication Sig Dispense Refill   ACCU-CHEK GUIDE test strip USE  AS  INSTRUCTED  DAILY 100 strip 2   Accu-Chek Softclix Lancets lancets USE AS DIRECTED EVERY DAY 100 each 2   Alcohol Swabs (DROPSAFE ALCOHOL PREP) 70 % PADS USE AS DIRECTED THREE TIMES DAILY 300 each 0   amiodarone (PACERONE) 200 MG tablet Take 100 mg by mouth daily.     amLODipine-valsartan (EXFORGE) 5-160 MG tablet Take 1 tablet by mouth daily. 90 tablet 0   apixaban (ELIQUIS) 2.5 MG TABS tablet Take 2.5 mg by mouth daily.     atorvastatin (LIPITOR) 80 MG tablet Take 1 tablet (80 mg total) by mouth daily. 90 tablet 1   Blood Glucose Monitoring Suppl (ACCU-CHEK GUIDE ME) w/Device KIT 1 each by Does not apply route daily. 1 kit 0   cholecalciferol (VITAMIN D) 1000 UNITS tablet Take 1,000 Units by mouth daily.      folic acid (FOLVITE) 400 MCG tablet Take 400 mcg by mouth daily.     Multiple Vitamins-Minerals (CENTRUM SILVER 50+WOMEN PO) Take 1 tablet by mouth daily.     No current facility-administered medications for this visit.     PHYSICAL EXAMINATION: ECOG PERFORMANCE STATUS: 1 - Symptomatic but completely ambulatory Vitals:   07/04/22 1315  BP: 137/67  Pulse: 72  Resp: 18  Temp: (!) 97.2 F (36.2 C)   Filed Weights   07/04/22 1315  Weight: 112 lb 11.2 oz (51.1 kg)    Physical Exam Constitutional:      General: She is not in acute distress.     Appearance: She is not diaphoretic.     Comments: Thin built   HENT:     Head: Normocephalic and atraumatic.     Nose: Nose  normal.     Mouth/Throat:     Pharynx: No oropharyngeal exudate.  Eyes:     General: No scleral icterus.       Left eye: No discharge.     Pupils: Pupils are equal, round, and reactive to light.  Neck:     Vascular: No JVD.  Cardiovascular:     Rate and Rhythm: Normal rate.     Heart sounds: Murmur heard.     No friction rub.  Pulmonary:     Effort: Pulmonary effort is normal. No respiratory distress.     Breath sounds: Normal breath sounds. No wheezing or rales.  Chest:     Chest wall: No tenderness.  Abdominal:     General: Bowel sounds are normal. There is no distension.     Palpations: Abdomen is soft.     Tenderness: There is no abdominal tenderness.  Musculoskeletal:        General: No tenderness. Normal range of motion.     Cervical back: Normal range of motion and neck supple.  Lymphadenopathy:     Cervical: No cervical adenopathy.  Skin:    General: Skin is warm and dry.     Findings: No erythema or rash.  Neurological:     Mental Status: She is alert and oriented to person, place, and time.     Cranial Nerves: No cranial nerve deficit.     Motor: No abnormal muscle tone.     Coordination: Coordination normal.  Psychiatric:        Mood and Affect: Affect normal.        Judgment: Judgment normal.      LABORATORY DATA:  I have reviewed the data as listed    Latest Ref Rng & Units 06/27/2022   11:09 AM 12/12/2021   11:05 AM 06/08/2021    1:37 PM  CBC  WBC 4.0 - 10.5 K/uL 3.6  3.6  3.7   Hemoglobin 12.0 - 15.0 g/dL 16.1  09.6  04.5   Hematocrit 36.0 - 46.0 % 37.9  36.4  36.1   Platelets 150 - 400 K/uL 174  182  179       Latest Ref Rng & Units 09/19/2021   10:08 AM 12/08/2020   10:52 AM 08/20/2020    8:33 AM  CMP  Glucose 65 - 99 mg/dL 409  811  914   BUN 7 - 25 mg/dL 18  17  14    Creatinine 0.60 - 0.95 mg/dL 7.82  9.56  2.13    Sodium 135 - 146 mmol/L 139  137  139   Potassium 3.5 - 5.3 mmol/L 4.6  3.9  4.3   Chloride 98 - 110 mmol/L 103  101  102   CO2 20 - 32 mmol/L 23  29  28    Calcium 8.6 - 10.4 mg/dL 8.9  8.7  9.3   Total Protein 6.1 - 8.1 g/dL 6.8  7.0  6.5   Total Bilirubin 0.2 - 1.2 mg/dL 0.5  0.3  0.5   Alkaline Phos 38 - 126 U/L  57    AST 10 - 35 U/L 20  23  20    ALT 6 - 29 U/L 19  22  20     Lab Results  Component Value Date   IRON 100 06/27/2022   TIBC 300 06/27/2022   FERRITIN 141 06/27/2022     SPEP not observed M spike.

## 2022-07-04 NOTE — Assessment & Plan Note (Signed)
#   Iron deficiency anemia, Labs reviewed and discussed with patient Hemoglobin >10  No need for erythropoietin replacement therapy.   Iron panel is stable. Continue oral iron supplementation 2 time per week.  

## 2022-08-08 ENCOUNTER — Other Ambulatory Visit: Payer: Self-pay | Admitting: Family Medicine

## 2022-08-08 DIAGNOSIS — E1122 Type 2 diabetes mellitus with diabetic chronic kidney disease: Secondary | ICD-10-CM

## 2022-09-19 NOTE — Progress Notes (Deleted)
Name: Emma Campbell   MRN: [id number]    DOB: 09-01-1932   Date:09/19/2022       Progress Note  Subjective  Chief Complaint  Follow Up  HPI  DMII with renal manifestation: CKI and microalbuminuria. Diagnosed many years ago. A1C has been well controlled, it was 6.1 %  6.5 % 5.9 % 6 % 6.4 % and today is 6.8 %  she has been off Metformin and still doing well.  Glucose at home still controlled , in the low 100s fasting . She denies polyphagia, polydipsia or polyuria . She denies hypoglycemic episodes.Continue ARB for kidney protection and also for HTN . Eye exam is up to date. She is on statin therapy for dyslipidemia and last LDL at goal   CKI stage III: she is on ARB ( Exforge ) bp has been well controlled, DM also controlled , no pruritis and she has normal urine output. Last GFR was stable at 50   HTN: taking medication daily and bp is at goal, no dizziness, chest pain or palpitation. She is only on Exforge off HCTZ and bp is at goal    Hyperlipidemia: she has been on Atorvastatin  80 mg daily . Last LDL showed increase of LDL from 70 to 84. We decided to continue current dose and recheck next visit    Sick Sinus Syndrome: she had a pacemaker placed in 2006 and has been on Amiodarone since 2006, she is now down to 100 mg of Amiodarone daily, last TSH still normal.  No chest pain, dizziness  or palpitation. Sees Dr. Juliann Pares , she is [place] score of 2, she is now off aspirin and only taking eliquis 2.5 mg , she will have a pacemaker replacement January 2024  She also has a history of Afib that has been rate controlled. She  has a history of AVM's  she denies GI bleed    Vitamin D deficiency: still taking supplementation, denies fatigue, last Vitamin D was normal  . Unchanged    Osteopenia: last FRAX 09/2019 12/2021 still has low FRAX score, continue high calcium diet, physical activity and vitamin D supplementation    Anemia: iron deficiency and positive hemoccult, seen by Dr. Lars Pinks and had EGD and  colonoscopy September 2018,diagnosed with AVM small bowel and had it cauterized, hemoglobin dropped and was referred to Dr. Cathie Hoops, She is currently taking iron twice a week and B12 three times a week. Feeling well   Patient Active Problem List   Diagnosis Date Noted   Anemia in chronic kidney disease (CKD) 12/26/2021   Osteopenia of multiple sites 09/19/2021   B12 deficiency 05/21/2017   Gastrointestinal tract imaging abnormality    Iron deficiency anemia    Polyp of sigmoid colon    Arthritis, degenerative 05/03/2015   Menopausal and perimenopausal disorder 05/03/2015   Chronic kidney disease (CKD), stage III (moderate) (HCC) 11/30/2014   Diverticulosis 11/30/2014   Fibrocystic breast disease 11/30/2014   Elevated LFTs 11/30/2014   Systolic ejection murmur 11/30/2014   Paroxysmal A-fib (HCC) 11/30/2014   Microalbuminuria 07/31/2014   Elevated TSH 07/31/2014   Pacemaker 07/31/2014   Hypercholesteremia 08/13/2013   Hypertension, benign 08/13/2013   Type 2 diabetes mellitus with stage 3 chronic kidney disease and hypertension (HCC) 08/13/2013   Vitamin D deficiency 04/12/2009    Past Surgical History:  Procedure Laterality Date   CATARACT EXTRACTION W/PHACO Right 01/07/2019   Procedure: CATARACT EXTRACTION PHACO AND INTRAOCULAR LENS PLACEMENT (IOC) RIGHT DIABETIC 02:15.1  25.7%        34.69;  Surgeon: Galen Manila, MD;  Location: [facility];  Service: Ophthalmology;  Laterality: Right;  Diabetic - oral meds   CATARACT EXTRACTION W/PHACO Left 01/28/2019   Procedure: CATARACT EXTRACTION PHACO AND INTRAOCULAR LENS PLACEMENT (IOC) LEFT DIABETIC 12.96  01:15.5;  Surgeon: Galen Manila, MD;  Location: [facility];  Service: Ophthalmology;  Laterality: Left;  Diabetic - oral meds   COLONOSCOPY WITH PROPOFOL N/A 11/06/2016   Procedure: COLONOSCOPY WITH PROPOFOL;  Surgeon: Midge Minium, MD;  Location: [facility];  Service: Endoscopy;  Laterality: N/A;   DIABETIC   ESOPHAGOGASTRODUODENOSCOPY (EGD) WITH PROPOFOL N/A 11/06/2016   Procedure: ESOPHAGOGASTRODUODENOSCOPY (EGD) WITH PROPOFOL;  Surgeon: Midge Minium, MD;  Location: [facility];  Service: Endoscopy;  Laterality: N/A;   ESOPHAGOGASTRODUODENOSCOPY (EGD) WITH PROPOFOL N/A 01/23/2017   Procedure: ESOPHAGOGASTRODUODENOSCOPY (EGD) WITH PROPOFOL with PUSH;  Surgeon: Midge Minium, MD;  Location: [facility] ENDOSCOPY;  Service: Endoscopy;  Laterality: N/A;   GIVENS CAPSULE STUDY N/A 12/22/2016   Procedure: GIVENS CAPSULE STUDY;  Surgeon: Midge Minium, MD;  Location: [facility] ENDOSCOPY;  Service: Endoscopy;  Laterality: N/A;   INSERT / REPLACE / REMOVE PACEMAKER     PACEMAKER INSERTION  2006   POLYPECTOMY  11/06/2016   Procedure: POLYPECTOMY;  Surgeon: Midge Minium, MD;  Location: [facility];  Service: Endoscopy;;   PPM GENERATOR CHANGEOUT N/A 03/30/2022   Procedure: PPM GENERATOR CHANGEOUT;  Surgeon: Marcina Millard, MD;  Location: [facility] INVASIVE CV LAB;  Service: Cardiovascular;  Laterality: N/A;    Family History  Problem Relation Age of Onset   Hypertension Mother    Stroke Mother    Diabetes Daughter    Stroke Father    Healthy Sister    Hypertension Maternal Grandmother     Social History   Tobacco Use   Smoking status: Never   Smokeless tobacco: Never   Tobacco comments:    smoking cessation materials not required  Substance Use Topics   Alcohol use: No    Alcohol/week: 0.0 standard drinks of alcohol     Current Outpatient Medications:    ACCU-CHEK GUIDE test strip, USE  AS  INSTRUCTED  DAILY, Disp: 100 strip, Rfl: 2   Accu-Chek Softclix Lancets lancets, USE AS DIRECTED EVERY DAY, Disp: 100 each, Rfl: 2   Alcohol Swabs (DROPSAFE ALCOHOL PREP) 70 % PADS, USE AS DIRECTED THREE TIMES DAILY, Disp: 300 each, Rfl: 0   amiodarone (PACERONE) 200 MG tablet, Take 100 mg by mouth daily., Disp: , Rfl:    amLODipine-valsartan (EXFORGE) 5-160 MG tablet, TAKE 1 TABLET BY MOUTH  DAILY, Disp: 90 tablet, Rfl: 0   apixaban (ELIQUIS) 2.5 MG TABS tablet, Take 2.5 mg by mouth daily., Disp: , Rfl:    atorvastatin (LIPITOR) 80 MG tablet, Take 1 tablet (80 mg total) by mouth daily., Disp: 90 tablet, Rfl: 1   Blood Glucose Monitoring Suppl (ACCU-CHEK GUIDE ME) w/Device KIT, 1 each by Does not apply route daily., Disp: 1 kit, Rfl: 0   cholecalciferol (VITAMIN D) 1000 UNITS tablet, Take 1,000 Units by mouth daily. , Disp: , Rfl:    folic acid (FOLVITE) 400 MCG tablet, Take 400 mcg by mouth daily., Disp: , Rfl:    Multiple Vitamins-Minerals (CENTRUM SILVER 50+WOMEN PO), Take 1 tablet by mouth daily., Disp: , Rfl:   Allergies  Allergen Reactions   Ace Inhibitors Other (See Comments)    Pt unable to recall. Was advised to avoid    I  personally reviewed active problem list, medication list, allergies, family history, social history, health maintenance with the patient/caregiver today.   ROS  ***  Objective  There were no vitals filed for this visit.  There is no height or weight on file to calculate BMI.  Physical Exam ***   PHQ2/9:    03/22/2022    8:43 AM 09/22/2021    1:00 PM 09/19/2021    9:38 AM 03/21/2021    9:38 AM 11/18/2020   10:02 AM  Depression screen PHQ 2/9  Decreased Interest 0 0 0 0 0  Down, Depressed, Hopeless 0 0 0 0 0  PHQ - 2 Score 0 0 0 0 0  Altered sleeping 0 0 0 0   Tired, decreased energy 0 0 0 0   Change in appetite 0 0 0 0   Feeling bad or failure about yourself  0 0 0 0   Trouble concentrating 0 0 0 0   Moving slowly or fidgety/restless 0 0 0 0   Suicidal thoughts 0 0 0 0   PHQ-9 Score 0 0 0 0     phq 9 is {gen pos [id number]}   Fall Risk:    03/22/2022    8:43 AM 09/22/2021    1:03 PM 09/19/2021    9:38 AM 03/21/2021    9:38 AM 11/18/2020   10:02 AM  Fall Risk   Falls in the past year? 0 0 0 0 0  Number falls in past yr:   0 0   Injury with Fall?   0 0   Risk for fall due to : No Fall Risks No Fall Risks No Fall Risks No  Fall Risks   Follow up Falls prevention discussed;Education provided;Falls evaluation completed Falls prevention discussed;Education provided;Falls evaluation completed Falls prevention discussed Falls prevention discussed Falls prevention discussed      Functional Status Survey:      Assessment & Plan  *** There are no diagnoses linked to this encounter.

## 2022-09-20 ENCOUNTER — Ambulatory Visit: Payer: 59 | Admitting: Family Medicine

## 2022-09-27 NOTE — Progress Notes (Unsigned)
Name: Emma Campbell   MRN: [id number]    DOB: 1932/04/03   Date:09/28/2022       Progress Note  Subjective  Chief Complaint  Follow up   HPI  DMII with renal manifestation and also Dyslipidemia : CKI and microalbuminuria. A1C has been well controlled, today is 6.5 %   she has been off Metformin due to weight loss and glucose has been controlled She denies polyphagia, polydipsia or polyuria . She denies hypoglycemic episodes.Continue ARB for kidney protection and also for HTN . Eye exam is up to date. She is on statin therapy for dyslipidemia and last LDL at goal but due for labs today   CKI stage III: she is on ARB ( Exforge ) bp has been well controlled, DM also controlled , no pruritis and she has normal urine output. Last GFR was stable at 50. We will check urine micro and GFR again today    HTN: taking medication daily and bp is at goal, no dizziness, chest pain or palpitation. She is only on Exforge off HCTZ when she first arrived was elevated but normalized with rest    Hyperlipidemia: she has been on Atorvastatin  80 mg daily . Last LDL showed increase of LDL from 70 to 84. We decided to continue current dose and recheck levels today    Sick Sinus Syndrome: she had a pacemaker placed in 2006 and has been on Amiodarone since 2006, she is now down to 100 mg of Amiodarone daily.   No chest pain, dizziness  or palpitation. Sees Dr. Juliann Pares , she is [place] score of 2, she is now off aspirin and only taking eliquis 2.5 mg , she will have a pacemaker replacement January 2024  She also has a history of Afib that has been rate controlled. She  has a history of AVM's  she denies GI bleed , no side effects of medications. We will recheck TSH due to high risk medication    Vitamin D deficiency: still taking supplementation, denies fatigue, last Vitamin D was normal  . Recheck levels today    Osteopenia: last FRAX 12/2021 still has low FRAX score, continue high calcium diet, physical activity and vitamin  D supplementation    Anemia: iron deficiency and positive hemoccult, seen by Dr. Lars Pinks and had EGD and colonoscopy September 2018,diagnosed with AVM small bowel and had it cauterized, hemoglobin dropped and was referred to Dr. Cathie Hoops, She is currently taking iron twice a week and B12 three times a week. Reviewed last labs with patient and she has a follow up with Dr. Cathie Hoops this Fall and will have repeat labs     Patient Active Problem List   Diagnosis Date Noted   Dyslipidemia associated with type 2 diabetes mellitus (HCC) 09/28/2022   Anemia in chronic kidney disease (CKD) 12/26/2021   Osteopenia of multiple sites 09/19/2021   B12 deficiency 05/21/2017   Gastrointestinal tract imaging abnormality    Iron deficiency anemia    Polyp of sigmoid colon    Arthritis, degenerative 05/03/2015   Menopausal and perimenopausal disorder 05/03/2015   Chronic kidney disease (CKD), stage III (moderate) (HCC) 11/30/2014   Diverticulosis 11/30/2014   Fibrocystic breast disease 11/30/2014   Elevated LFTs 11/30/2014   Systolic ejection murmur 11/30/2014   Paroxysmal A-fib (HCC) 11/30/2014   Microalbuminuria 07/31/2014   Elevated TSH 07/31/2014   Pacemaker 07/31/2014   Hypercholesteremia 08/13/2013   Hypertension, benign 08/13/2013   Type 2 diabetes mellitus with stage 3 chronic kidney  disease and hypertension (HCC) 08/13/2013   Vitamin D deficiency 04/12/2009    Past Surgical History:  Procedure Laterality Date   CATARACT EXTRACTION W/PHACO Right 01/07/2019   Procedure: CATARACT EXTRACTION PHACO AND INTRAOCULAR LENS PLACEMENT (IOC) RIGHT DIABETIC 02:15.1          25.7%        34.69;  Surgeon: Galen Manila, MD;  Location: [facility];  Service: Ophthalmology;  Laterality: Right;  Diabetic - oral meds   CATARACT EXTRACTION W/PHACO Left 01/28/2019   Procedure: CATARACT EXTRACTION PHACO AND INTRAOCULAR LENS PLACEMENT (IOC) LEFT DIABETIC 12.96  01:15.5;  Surgeon: Galen Manila, MD;  Location:  [facility];  Service: Ophthalmology;  Laterality: Left;  Diabetic - oral meds   COLONOSCOPY WITH PROPOFOL N/A 11/06/2016   Procedure: COLONOSCOPY WITH PROPOFOL;  Surgeon: Midge Minium, MD;  Location: [facility];  Service: Endoscopy;  Laterality: N/A;  DIABETIC   ESOPHAGOGASTRODUODENOSCOPY (EGD) WITH PROPOFOL N/A 11/06/2016   Procedure: ESOPHAGOGASTRODUODENOSCOPY (EGD) WITH PROPOFOL;  Surgeon: Midge Minium, MD;  Location: [facility];  Service: Endoscopy;  Laterality: N/A;   ESOPHAGOGASTRODUODENOSCOPY (EGD) WITH PROPOFOL N/A 01/23/2017   Procedure: ESOPHAGOGASTRODUODENOSCOPY (EGD) WITH PROPOFOL with PUSH;  Surgeon: Midge Minium, MD;  Location: [facility];  Service: Endoscopy;  Laterality: N/A;   GIVENS CAPSULE STUDY N/A 12/22/2016   Procedure: GIVENS CAPSULE STUDY;  Surgeon: Midge Minium, MD;  Location: [facility];  Service: Endoscopy;  Laterality: N/A;   INSERT / REPLACE / REMOVE PACEMAKER     PACEMAKER INSERTION  2006   POLYPECTOMY  11/06/2016   Procedure: POLYPECTOMY;  Surgeon: Midge Minium, MD;  Location: [facility];  Service: Endoscopy;;   PPM GENERATOR CHANGEOUT N/A 03/30/2022   Procedure: PPM GENERATOR CHANGEOUT;  Surgeon: Marcina Millard, MD;  Location: [facility];  Service: Cardiovascular;  Laterality: N/A;    Family History  Problem Relation Age of Onset   Hypertension Mother    Stroke Mother    Diabetes Daughter    Stroke Father    Healthy Sister    Hypertension Maternal Grandmother     Social History   Tobacco Use   Smoking status: Never   Smokeless tobacco: Never   Tobacco comments:    smoking cessation materials not required  Substance Use Topics   Alcohol use: No    Alcohol/week: 0.0 standard drinks of alcohol     Current Outpatient Medications:    ACCU-CHEK GUIDE test strip, USE  AS  INSTRUCTED  DAILY, Disp: 100 strip, Rfl: 2   Accu-Chek Softclix Lancets lancets, USE AS DIRECTED EVERY DAY, Disp: 100 each,  Rfl: 2   Alcohol Swabs (DROPSAFE ALCOHOL PREP) 70 % PADS, USE AS DIRECTED THREE TIMES DAILY, Disp: 300 each, Rfl: 0   amiodarone (PACERONE) 200 MG tablet, Take 100 mg by mouth daily., Disp: , Rfl:    apixaban (ELIQUIS) 2.5 MG TABS tablet, Take 2.5 mg by mouth daily., Disp: , Rfl:    Blood Glucose Monitoring Suppl (ACCU-CHEK GUIDE ME) w/Device KIT, 1 each by Does not apply route daily., Disp: 1 kit, Rfl: 0   cholecalciferol (VITAMIN D) 1000 UNITS tablet, Take 1,000 Units by mouth daily. , Disp: , Rfl:    folic acid (FOLVITE) 400 MCG tablet, Take 400 mcg by mouth daily., Disp: , Rfl:    Multiple Vitamins-Minerals (CENTRUM SILVER 50+WOMEN PO), Take 1 tablet by mouth daily., Disp: , Rfl:    amLODipine-valsartan (EXFORGE) 5-160 MG tablet, Take 1 tablet by mouth daily., Disp: 90  tablet, Rfl: 1   atorvastatin (LIPITOR) 80 MG tablet, Take 1 tablet (80 mg total) by mouth daily., Disp: 90 tablet, Rfl: 1  Allergies  Allergen Reactions   Ace Inhibitors Other (See Comments)    Pt unable to recall. Was advised to avoid    I personally reviewed active problem list, medication list, allergies, family history with the patient/caregiver today.   ROS  Constitutional: Negative for fever, positive for mild  weight change - she has always been thin, discussed importance of not skipping meals and adding a protein shake once day .  Respiratory: Negative for cough and shortness of breath.   Cardiovascular: Negative for chest pain or palpitations.  Gastrointestinal: Negative for abdominal pain, no bowel changes.  Musculoskeletal: Negative for gait problem or joint swelling.  Skin: Negative for rash.  Neurological: Negative for dizziness or headache.  No other specific complaints in a complete review of systems (except as listed in HPI above).   Objective  Vitals:   09/28/22 0749 09/28/22 0816  BP: (!) 154/72 128/62  Pulse: 75   Resp: 16   Temp: 98.3 F (36.8 C)   TempSrc: Oral   SpO2: 98%   Weight:  109 lb 12.8 oz (49.8 kg)   Height: 5\' 1"  (1.549 m)     Body mass index is 20.75 kg/m.  Physical Exam  Constitutional: Patient appears well-developed and well-nourished. Obese  No distress.  HEENT: head atraumatic, normocephalic, pupils equal and reactive to light, ears , neck supple, throat within normal limits Cardiovascular: Normal rate, regular rhythm , she has a musical systolic murmur heard loudest on 2nd intercostal space / left side  No murmur heard. No BLE edema. Pulmonary/Chest: Effort normal and breath sounds normal. No respiratory distress. Abdominal: Soft.  There is no tenderness. Psychiatric: Patient has a normal mood and affect. behavior is normal. Judgment and thought content normal.   Recent Results (from the past 2160 hour(s))  POCT HgB A1C     Status: Abnormal   Collection Time: 09/28/22  8:00 AM  Result Value Ref Range   Hemoglobin A1C 6.5 (A) 4.0 - 5.6 %   HbA1c POC (<> result, manual entry)     HbA1c, POC (prediabetic range)     HbA1c, POC (controlled diabetic range)       PHQ2/9:    09/28/2022    7:48 AM 03/22/2022    8:43 AM 09/22/2021    1:00 PM 09/19/2021    9:38 AM 03/21/2021    9:38 AM  Depression screen PHQ 2/9  Decreased Interest 0 0 0 0 0  Down, Depressed, Hopeless 0 0 0 0 0  PHQ - 2 Score 0 0 0 0 0  Altered sleeping 0 0 0 0 0  Tired, decreased energy 0 0 0 0 0  Change in appetite 0 0 0 0 0  Feeling bad or failure about yourself  0 0 0 0 0  Trouble concentrating 0 0 0 0 0  Moving slowly or fidgety/restless 0 0 0 0 0  Suicidal thoughts 0 0 0 0 0  PHQ-9 Score 0 0 0 0 0  Difficult doing work/chores Not difficult at all        phq 9 is negative   Fall Risk:    09/28/2022    7:48 AM 03/22/2022    8:43 AM 09/22/2021    1:03 PM 09/19/2021    9:38 AM 03/21/2021    9:38 AM  Fall Risk   Falls in the past  year? 0 0 0 0 0  Number falls in past yr: 0   0 0  Injury with Fall? 0   0 0  Risk for fall due to : No Fall Risks No Fall Risks No Fall Risks  No Fall Risks No Fall Risks  Follow up Falls prevention discussed;Education provided;Falls evaluation completed Falls prevention discussed;Education provided;Falls evaluation completed Falls prevention discussed;Education provided;Falls evaluation completed Falls prevention discussed Falls prevention discussed     Functional Status Survey: Is the patient deaf or have difficulty hearing?: No Does the patient have difficulty seeing, even when wearing glasses/contacts?: No Does the patient have difficulty concentrating, remembering, or making decisions?: No Does the patient have difficulty walking or climbing stairs?: No Does the patient have difficulty dressing or bathing?: No Does the patient have difficulty doing errands alone such as visiting a doctor's office or shopping?: No    Assessment & Plan  1. Controlled type 2 diabetes mellitus with stage 3 chronic kidney disease, without long-term current use of insulin (HCC)  - POCT HgB A1C - amLODipine-valsartan (EXFORGE) 5-160 MG tablet; Take 1 tablet by mouth daily.  Dispense: 90 tablet; Refill: 1 - Microalbumin / creatinine urine ratio - COMPLETE METABOLIC PANEL WITH GFR  2. Dyslipidemia associated with type 2 diabetes mellitus (HCC)  - atorvastatin (LIPITOR) 80 MG tablet; Take 1 tablet (80 mg total) by mouth daily.  Dispense: 90 tablet; Refill: 1 - Lipid panel  3. Paroxysmal A-fib (HCC)  Doing well now  4. B12 deficiency  Continue supplementation  5. Osteopenia of multiple sites  Repeat in 2025  6. Vitamin D deficiency  - VITAMIN D 25 Hydroxy (Vit-D Deficiency, Fractures)  7. Hypertension, benign  At goal, continue current dose  8. Pacemaker  Monitored by Dr. Juliann Pares   9. Iron deficiency anemia due to chronic blood loss  Under the care of Dr. Cathie Hoops  10. High risk medication use  - TSH

## 2022-09-28 ENCOUNTER — Ambulatory Visit (INDEPENDENT_AMBULATORY_CARE_PROVIDER_SITE_OTHER): Payer: 59 | Admitting: Family Medicine

## 2022-09-28 ENCOUNTER — Encounter: Payer: Self-pay | Admitting: Family Medicine

## 2022-09-28 VITALS — BP 128/62 | HR 75 | Temp 98.3°F | Resp 16 | Ht 61.0 in | Wt 109.8 lb

## 2022-09-28 DIAGNOSIS — E559 Vitamin D deficiency, unspecified: Secondary | ICD-10-CM

## 2022-09-28 DIAGNOSIS — N183 Chronic kidney disease, stage 3 unspecified: Secondary | ICD-10-CM | POA: Diagnosis not present

## 2022-09-28 DIAGNOSIS — I480 Paroxysmal atrial fibrillation: Secondary | ICD-10-CM

## 2022-09-28 DIAGNOSIS — E1122 Type 2 diabetes mellitus with diabetic chronic kidney disease: Secondary | ICD-10-CM

## 2022-09-28 DIAGNOSIS — Z950 Presence of cardiac pacemaker: Secondary | ICD-10-CM

## 2022-09-28 DIAGNOSIS — I10 Essential (primary) hypertension: Secondary | ICD-10-CM | POA: Diagnosis not present

## 2022-09-28 DIAGNOSIS — Z79899 Other long term (current) drug therapy: Secondary | ICD-10-CM | POA: Diagnosis not present

## 2022-09-28 DIAGNOSIS — D500 Iron deficiency anemia secondary to blood loss (chronic): Secondary | ICD-10-CM

## 2022-09-28 DIAGNOSIS — E538 Deficiency of other specified B group vitamins: Secondary | ICD-10-CM | POA: Diagnosis not present

## 2022-09-28 DIAGNOSIS — E785 Hyperlipidemia, unspecified: Secondary | ICD-10-CM | POA: Diagnosis not present

## 2022-09-28 DIAGNOSIS — M8589 Other specified disorders of bone density and structure, multiple sites: Secondary | ICD-10-CM

## 2022-09-28 DIAGNOSIS — E1169 Type 2 diabetes mellitus with other specified complication: Secondary | ICD-10-CM

## 2022-09-28 LAB — POCT GLYCOSYLATED HEMOGLOBIN (HGB A1C): Hemoglobin A1C: 6.5 % — AB (ref 4.0–5.6)

## 2022-09-28 MED ORDER — ATORVASTATIN CALCIUM 80 MG PO TABS
80.0000 mg | ORAL_TABLET | Freq: Every day | ORAL | 1 refills | Status: DC
Start: 2022-09-28 — End: 2022-10-24

## 2022-09-28 MED ORDER — AMLODIPINE BESYLATE-VALSARTAN 5-160 MG PO TABS
1.0000 | ORAL_TABLET | Freq: Every day | ORAL | 1 refills | Status: DC
Start: 2022-09-28 — End: 2022-10-24

## 2022-10-02 ENCOUNTER — Other Ambulatory Visit: Payer: Self-pay | Admitting: Family Medicine

## 2022-10-02 DIAGNOSIS — N183 Chronic kidney disease, stage 3 unspecified: Secondary | ICD-10-CM

## 2022-10-12 ENCOUNTER — Other Ambulatory Visit: Payer: Self-pay | Admitting: Family Medicine

## 2022-10-12 DIAGNOSIS — N183 Chronic kidney disease, stage 3 unspecified: Secondary | ICD-10-CM | POA: Diagnosis not present

## 2022-10-12 DIAGNOSIS — R001 Bradycardia, unspecified: Secondary | ICD-10-CM | POA: Diagnosis not present

## 2022-10-12 DIAGNOSIS — I10 Essential (primary) hypertension: Secondary | ICD-10-CM | POA: Diagnosis not present

## 2022-10-12 DIAGNOSIS — R079 Chest pain, unspecified: Secondary | ICD-10-CM | POA: Diagnosis not present

## 2022-10-12 DIAGNOSIS — E1122 Type 2 diabetes mellitus with diabetic chronic kidney disease: Secondary | ICD-10-CM | POA: Diagnosis not present

## 2022-10-12 DIAGNOSIS — E785 Hyperlipidemia, unspecified: Secondary | ICD-10-CM

## 2022-10-12 DIAGNOSIS — I495 Sick sinus syndrome: Secondary | ICD-10-CM | POA: Diagnosis not present

## 2022-10-12 DIAGNOSIS — E1169 Type 2 diabetes mellitus with other specified complication: Secondary | ICD-10-CM

## 2022-10-12 DIAGNOSIS — Z8679 Personal history of other diseases of the circulatory system: Secondary | ICD-10-CM | POA: Diagnosis not present

## 2022-10-12 DIAGNOSIS — Z950 Presence of cardiac pacemaker: Secondary | ICD-10-CM | POA: Diagnosis not present

## 2022-10-12 DIAGNOSIS — R011 Cardiac murmur, unspecified: Secondary | ICD-10-CM | POA: Diagnosis not present

## 2022-10-19 ENCOUNTER — Ambulatory Visit (INDEPENDENT_AMBULATORY_CARE_PROVIDER_SITE_OTHER): Payer: 59

## 2022-10-19 VITALS — BP 135/74 | HR 86 | Ht 61.0 in | Wt 109.0 lb

## 2022-10-19 DIAGNOSIS — Z0000 Encounter for general adult medical examination without abnormal findings: Secondary | ICD-10-CM | POA: Diagnosis not present

## 2022-10-19 NOTE — Progress Notes (Addendum)
Subjective:   Emma Campbell is a 89 y.o. female who presents for Medicare Annual (Subsequent) preventive examination.  Visit Complete: Virtual  I connected with  Emma Campbell on 10/19/22 by a audio enabled telemedicine application and verified that I am speaking with the correct person using two identifiers.  Patient Location: Home  Provider Location: Office/Clinic  I discussed the limitations of evaluation and management by telemedicine. The patient expressed understanding and agreed to proceed.  Vital Signs: Unable to obtain new vitals due to this being a telehealth visit. Pt gave VS afterward she took at home.  Patient Medicare AWV questionnaire was completed by the patient on (not done); I have confirmed that all information answered by patient is correct and no changes since this date.  Review of Systems    Cardiac Risk Factors include: advanced age (>53men, >58 women);diabetes mellitus;dyslipidemia;hypertension;sedentary lifestyle    Objective:    Today's Vitals   10/19/22 1501  Weight: 109 lb (49.4 kg)  Height: 5\' 1"  (1.549 m)   Body mass index is 20.6 kg/m.     10/19/2022    3:10 PM 07/04/2022    1:12 PM 03/30/2022    7:45 AM 12/09/2020   10:07 AM 09/21/2020   11:42 AM 06/07/2020   10:10 AM 12/12/2019    9:43 AM  Advanced Directives  Does Patient Have a Medical Advance Directive? Yes Yes No Yes No Yes No  Type of Advance Directive Living will;Healthcare Power of Attorney Living will;Healthcare Power of [employer] Power of [place];Living will     Does patient want to make changes to medical advance directive?    No - Patient declined  Yes (MAU/Ambulatory/Procedural Areas - Information given)   Copy of Healthcare Power of Attorney in Chart?  No - copy requested       Would patient like information on creating a medical advance directive?   No - Patient declined  Yes (MAU/Ambulatory/Procedural Areas - Information given) Yes (ED - Information included in AVS)  No - Patient declined    Current Medications (verified) Outpatient Encounter Medications as of 10/19/2022  Medication Sig   ACCU-CHEK GUIDE test strip USE  AS  INSTRUCTED  DAILY   Accu-Chek Softclix Lancets lancets USE AS DIRECTED EVERY DAY   Alcohol Swabs (DROPSAFE ALCOHOL PREP) 70 % PADS USE AS DIRECTED THREE TIMES DAILY   amiodarone (PACERONE) 200 MG tablet Take 100 mg by mouth daily.   amLODipine-valsartan (EXFORGE) 5-160 MG tablet Take 1 tablet by mouth daily.   apixaban (ELIQUIS) 2.5 MG TABS tablet Take 2.5 mg by mouth daily.   atorvastatin (LIPITOR) 80 MG tablet Take 1 tablet (80 mg total) by mouth daily.   Blood Glucose Monitoring Suppl (ACCU-CHEK GUIDE ME) w/Device KIT 1 each by Does not apply route daily.   cholecalciferol (VITAMIN D) 1000 UNITS tablet Take 1,000 Units by mouth daily.    folic acid (FOLVITE) 400 MCG tablet Take 400 mcg by mouth daily.   Multiple Vitamins-Minerals (CENTRUM SILVER 50+WOMEN PO) Take 1 tablet by mouth daily.   No facility-administered encounter medications on file as of 10/19/2022.    Allergies (verified) Ace inhibitors   History: Past Medical History:  Diagnosis Date   Anemia    Chronic kidney disease, stage III (moderate) (HCC)    Diverticulosis    Elevated LFTs    Essential hypertension, malignant    Fibrocystic breast disease    Heart murmur    Hyperlipidemia    Menopausal problem  Osteoarthrosis    Osteoporosis    Peripheral autonomic neuropathy    Presence of permanent cardiac pacemaker    Sick sinus syndrome (HCC)    Dr. Leslee Home   Type II diabetes mellitus with renal manifestations (HCC)    Vitamin D deficiency    Past Surgical History:  Procedure Laterality Date   CATARACT EXTRACTION W/PHACO Right 01/07/2019   Procedure: CATARACT EXTRACTION PHACO AND INTRAOCULAR LENS PLACEMENT (IOC) RIGHT DIABETIC 02:15.1          25.7%        34.69;  Surgeon: Galen Manila, MD;  Location: [facility];  Service:  Ophthalmology;  Laterality: Right;  Diabetic - oral meds   CATARACT EXTRACTION W/PHACO Left 01/28/2019   Procedure: CATARACT EXTRACTION PHACO AND INTRAOCULAR LENS PLACEMENT (IOC) LEFT DIABETIC 12.96  01:15.5;  Surgeon: Galen Manila, MD;  Location: [facility];  Service: Ophthalmology;  Laterality: Left;  Diabetic - oral meds   COLONOSCOPY WITH PROPOFOL N/A 11/06/2016   Procedure: COLONOSCOPY WITH PROPOFOL;  Surgeon: Midge Minium, MD;  Location: [facility];  Service: Endoscopy;  Laterality: N/A;  DIABETIC   ESOPHAGOGASTRODUODENOSCOPY (EGD) WITH PROPOFOL N/A 11/06/2016   Procedure: ESOPHAGOGASTRODUODENOSCOPY (EGD) WITH PROPOFOL;  Surgeon: Midge Minium, MD;  Location: [facility];  Service: Endoscopy;  Laterality: N/A;   ESOPHAGOGASTRODUODENOSCOPY (EGD) WITH PROPOFOL N/A 01/23/2017   Procedure: ESOPHAGOGASTRODUODENOSCOPY (EGD) WITH PROPOFOL with PUSH;  Surgeon: Midge Minium, MD;  Location: [facility] ENDOSCOPY;  Service: Endoscopy;  Laterality: N/A;   GIVENS CAPSULE STUDY N/A 12/22/2016   Procedure: GIVENS CAPSULE STUDY;  Surgeon: Midge Minium, MD;  Location: [facility] ENDOSCOPY;  Service: Endoscopy;  Laterality: N/A;   INSERT / REPLACE / REMOVE PACEMAKER     PACEMAKER INSERTION  2006   POLYPECTOMY  11/06/2016   Procedure: POLYPECTOMY;  Surgeon: Midge Minium, MD;  Location: [facility];  Service: Endoscopy;;   PPM GENERATOR CHANGEOUT N/A 03/30/2022   Procedure: PPM GENERATOR CHANGEOUT;  Surgeon: Marcina Millard, MD;  Location: [facility] INVASIVE CV LAB;  Service: Cardiovascular;  Laterality: N/A;   Family History  Problem Relation Age of Onset   Hypertension Mother    Stroke Mother    Diabetes Daughter    Stroke Father    Healthy Sister    Hypertension Maternal Grandmother    Social History   Socioeconomic History   Marital status: Widowed    Spouse name: Onalee Hua   Number of children: 1   Years of education: 2 years of college   Highest education level: 12th grade   Occupational History   Occupation: Retired Human resources officer at [employer]  Tobacco Use   Smoking status: Never   Smokeless tobacco: Never   Tobacco comments:    smoking cessation materials not required  Vaping Use   Vaping status: Never Used  Substance and Sexual Activity   Alcohol use: No    Alcohol/week: 0.0 standard drinks of alcohol   Drug use: No   Sexual activity: Never  Other Topics Concern   Not on file  Social History Narrative   She still has a house but her daughter and grand-daughter are staying at her house   She moved in with her older sister ( 8 years older), to help her out.    Social Determinants of Health   Financial Resource Strain: Low Risk  (10/19/2022)   Overall Financial Resource Strain (CARDIA)    Difficulty of Paying Living Expenses: Not hard at all  Food Insecurity: No Food Insecurity (10/19/2022)  Hunger Vital Sign    Worried About Running Out of Food in the Last Year: Never true    Ran Out of Food in the Last Year: Never true  Transportation Needs: No Transportation Needs (10/19/2022)   PRAPARE - Administrator, Civil Service (Medical): No    Lack of Transportation (Non-Medical): No  Physical Activity: Inactive (10/19/2022)   Exercise Vital Sign    Days of Exercise per Week: 0 days    Minutes of Exercise per Session: 0 min  Stress: No Stress Concern Present (10/19/2022)   [employer] of Occupational Health - Occupational Stress Questionnaire    Feeling of Stress : Not at all  Social Connections: Moderately Isolated (10/19/2022)   Social Connection and Isolation Panel [NHANES]    Frequency of Communication with Friends and Family: More than three times a week    Frequency of Social Gatherings with Friends and Family: More than three times a week    Attends Religious Services: More than 4 times per year    Active Member of [employer] or Organizations: No    Attends Banker Meetings: Never    Marital Status: Widowed     Tobacco Counseling Counseling given: Not Answered Tobacco comments: smoking cessation materials not required   Clinical Intake:  Pre-visit preparation completed: Yes  Pain : No/denies pain   BMI - recorded: 20.6 Nutritional Status: BMI of 19-24  Normal Nutritional Risks: None Diabetes: Yes CBG done?: Yes (BS 117 this am at home) CBG resulted in Enter/ Edit results?: No Did pt. bring in CBG monitor from home?: No  How often do you need to have someone help you when you read instructions, pamphlets, or other written materials from your doctor or pharmacy?: 1 - Never  Interpreter Needed?: No  Comments: lives with sister Information entered by :: B.Annalysa Mohammad,LPN   Activities of Daily Living    10/19/2022    3:10 PM 09/28/2022    7:48 AM  In your present state of health, do you have any difficulty performing the following activities:  Hearing? 0 0  Vision? 0 0  Difficulty concentrating or making decisions? 0 0  Walking or climbing stairs? 0 0  Dressing or bathing? 0 0  Doing errands, shopping? 0 0  Preparing Food and eating ? N   Using the Toilet? N   In the past six months, have you accidently leaked urine? N   Do you have problems with loss of bowel control? N   Managing your Medications? N   Managing your Finances? N   Housekeeping or managing your Housekeeping? N     Patient Care Team: Alba Cory, MD as PCP - General (Family Medicine) Alwyn Pea, MD as Consulting Physician (Cardiology) Rickard Patience, MD as Consulting Physician (Oncology) Gaspar Cola, [facility] (Inactive) as Pharmacist (Pharmacist)  Indicate any recent Medical Services you may have received from other than [facility] providers in the past year (date may be approximate).     Assessment:   This is a routine wellness examination for [place].  Hearing/Vision screen Hearing Screening - Comments:: Adequate hearing Vision Screening - Comments:: Adequate vision w/glasses Dr  Melanie Crazier  Dietary issues and exercise activities discussed:     Goals Addressed             This Visit's Progress    DIET - INCREASE WATER INTAKE   On track    Recommend to drink at least 6-8 8oz glasses of water per day.  Monitor and Manage My Blood Sugar-Diabetes Type 2   On track    Timeframe:  Long-Range Goal Priority:  High Start Date: 08/12/2020                              Expected End Date:  08/12/2021                      Follow Up Date 10/26/2020    - check blood sugar at prescribed times - check blood sugar if I feel it is too high or too low - enter blood sugar readings and medication or insulin into daily log    Why is this important?   Checking your blood sugar at home helps to keep it from getting very high or very low.  Writing the results in a diary or log helps the doctor know how to care for you.  Your blood sugar log should have the time, date and the results.  Also, write down the amount of insulin or other medicine that you take.  Other information, like what you ate, exercise done and how you were feeling, will also be helpful.     Notes:        Depression Screen    10/19/2022    3:07 PM 09/28/2022    7:48 AM 03/22/2022    8:43 AM 09/22/2021    1:00 PM 09/19/2021    9:38 AM 03/21/2021    9:38 AM 11/18/2020   10:02 AM  PHQ 2/9 Scores  PHQ - 2 Score 0 0 0 0 0 0 0  PHQ- 9 Score  0 0 0 0 0     Fall Risk    10/19/2022    3:04 PM 09/28/2022    7:48 AM 03/22/2022    8:43 AM 09/22/2021    1:03 PM 09/19/2021    9:38 AM  Fall Risk   Falls in the past year? 0 0 0 0 0  Number falls in past yr: 0 0   0  Injury with Fall? 0 0   0  Risk for fall due to : No Fall Risks No Fall Risks No Fall Risks No Fall Risks No Fall Risks  Follow up Education provided;Falls prevention discussed Falls prevention discussed;Education provided;Falls evaluation completed Falls prevention discussed;Education provided;Falls evaluation completed Falls prevention  discussed;Education provided;Falls evaluation completed Falls prevention discussed    MEDICARE RISK AT HOME: Medicare Risk at Home Any stairs in or around the home?: Yes If so, are there any without handrails?: Yes Home free of loose throw rugs in walkways, pet beds, electrical cords, etc?: Yes Adequate lighting in your home to reduce risk of falls?: Yes Life alert?: No Use of a cane, walker or w/c?: No Grab bars in the bathroom?: Yes Shower chair or bench in shower?: Yes Elevated toilet seat or a handicapped toilet?: Yes  TIMED UP AND GO:  Was the test performed?  No    Cognitive Function:        10/19/2022    3:14 PM 09/22/2021    1:03 PM 09/11/2019   11:37 AM 09/10/2018   11:38 AM 05/18/2017    9:31 AM  6CIT Screen  What Year? 0 points 0 points 0 points 0 points 0 points  What month? 0 points 0 points 0 points 0 points 0 points  What time? 0 points 0 points 0 points 0 points 3 points  Count back from  20 0 points 0 points 0 points 0 points 0 points  Months in reverse 0 points 0 points 0 points 2 points 0 points  Repeat phrase 0 points 0 points 2 points 0 points 0 points  Total Score 0 points 0 points 2 points 2 points 3 points    Immunizations Immunization History  Administered Date(s) Administered   Covid-19, Mrna,Vaccine(Spikevax)73yrs and older 12/27/2021   Fluad Quad(high Dose 65+) 11/19/2018, 12/15/2019, 11/28/2021   Influenza, High Dose Seasonal PF 11/30/2014, 12/09/2015, 12/11/2016, 12/17/2017, 11/18/2020   Influenza-Unspecified 10/28/2013   PFIZER Comirnaty(Gray Top)Covid-19 Tri-Sucrose Vaccine 09/17/2020   PFIZER(Purple Top)SARS-COV-2 Vaccination 04/03/2019, 04/24/2019, 11/27/2019   Pneumococcal Conjugate-13 07/31/2014   Pneumococcal Polysaccharide-23 08/11/2009   Tdap 08/11/2009, 10/16/2016   Zoster Recombinant(Shingrix) 08/25/2020, 10/29/2020   Zoster, Live 12/19/2011    TDAP status: Up to date  Flu Vaccine status: Up to date  Pneumococcal vaccine  status: Up to date  Covid-19 vaccine status: Completed vaccines  Qualifies for Shingles Vaccine? Yes   Zostavax completed Yes   Shingrix Completed?: Yes  Screening Tests Health Maintenance  Topic Date Due   COVID-19 Vaccine (6 - 2023-24 season) 02/21/2022   INFLUENZA VACCINE  09/28/2022   OPHTHALMOLOGY EXAM  12/06/2022   FOOT EXAM  03/23/2023   HEMOGLOBIN A1C  03/31/2023   Medicare Annual Wellness (AWV)  10/19/2023   DTaP/Tdap/Td (3 - Td or Tdap) 10/17/2026   Pneumonia Vaccine 28+ Years old  Completed   DEXA SCAN  Completed   Zoster Vaccines- Shingrix  Completed   HPV VACCINES  Aged Out    Health Maintenance  Health Maintenance Due  Topic Date Due   COVID-19 Vaccine (6 - 2023-24 season) 02/21/2022   INFLUENZA VACCINE  09/28/2022    Colorectal cancer screening: No longer required.   Mammogram status: No longer required due to age.  Lung Cancer Screening: (Low Dose CT Chest recommended if Age 55-80 years, 20 pack-year currently smoking OR have quit w/in 15years.) does not qualify.   Lung Cancer Screening Referral: no  Additional Screening:  Hepatitis C Screening: does not qualify; Completed yes  Vision Screening: Recommended annual ophthalmology exams for early detection of glaucoma and other disorders of the eye. Is the patient up to date with their annual eye exam?  Yes  Who is the provider or what is the name of the office in which the patient attends annual eye exams? Dr Melanie Crazier If pt is not established with a provider, would they like to be referred to a provider to establish care? No .   Dental Screening: Recommended annual dental exams for proper oral hygiene  Diabetic Foot Exam: Diabetic Foot Exam: Completed yes  Community Resource Referral / Chronic Care Management: CRR required this visit?  No   CCM required this visit?  No    Plan:     I have personally reviewed and noted the following in the patient's chart:   Medical and social history Use  of alcohol, tobacco or illicit drugs  Current medications and supplements including opioid prescriptions. Patient is not currently taking opioid prescriptions. Functional ability and status Nutritional status Physical activity Advanced directives List of other physicians Hospitalizations, surgeries, and ER visits in previous 12 months Vitals Screenings to include cognitive, depression, and falls Referrals and appointments  In addition, I have reviewed and discussed with patient certain preventive protocols, quality metrics, and best practice recommendations. A written personalized care plan for preventive services as well as general preventive health recommendations were provided to patient.  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 She has no concerns or questions at this time.

## 2022-10-19 NOTE — Patient Instructions (Addendum)
Emma Campbell , Thank you for taking time to come for your Medicare Wellness Visit. I appreciate your ongoing commitment to your health goals. Please review the following plan we discussed and let me know if I can assist you in the future.   Referrals/Orders/Follow-Ups/Clinician Recommendations: none  This is a list of the screening recommended for you and due dates:  Health Maintenance  Topic Date Due   COVID-19 Vaccine (6 - 2023-24 season) 02/21/2022   Flu Shot  09/28/2022   Eye exam for diabetics  12/06/2022   Complete foot exam   03/23/2023   Hemoglobin A1C  03/31/2023   Medicare Annual Wellness Visit  10/19/2023   DTaP/Tdap/Td vaccine (3 - Td or Tdap) 10/17/2026   Pneumonia Vaccine  Completed   DEXA scan (bone density measurement)  Completed   Zoster (Shingles) Vaccine  Completed   HPV Vaccine  Aged Out    Advanced directives: (Copy Requested) Please bring a copy of your health care power of attorney and living will to the office to be added to your chart at your convenience.  Next Medicare Annual Wellness Visit scheduled for next year: Yes 10/25/23 @ 3pm telephone

## 2022-10-24 ENCOUNTER — Other Ambulatory Visit: Payer: Self-pay | Admitting: Family Medicine

## 2022-10-24 DIAGNOSIS — E1169 Type 2 diabetes mellitus with other specified complication: Secondary | ICD-10-CM

## 2022-10-24 DIAGNOSIS — E1122 Type 2 diabetes mellitus with diabetic chronic kidney disease: Secondary | ICD-10-CM

## 2022-12-06 ENCOUNTER — Telehealth: Payer: Self-pay | Admitting: Family Medicine

## 2022-12-06 NOTE — Telephone Encounter (Signed)
Copied from CRM [id number]. Topic: Appointment Scheduling - Scheduling Inquiry for Clinic >> Dec 06, 2022 12:04 PM Emma Campbell wrote: Reason for CRM: The patient is coming in on Friday for her High Dose flu shot but states if she needs any other shot she would like to get them done then as well. Please assist patient further

## 2022-12-08 ENCOUNTER — Ambulatory Visit (INDEPENDENT_AMBULATORY_CARE_PROVIDER_SITE_OTHER): Payer: 59

## 2022-12-08 DIAGNOSIS — H02051 Trichiasis without entropian right upper eyelid: Secondary | ICD-10-CM | POA: Diagnosis not present

## 2022-12-08 DIAGNOSIS — E119 Type 2 diabetes mellitus without complications: Secondary | ICD-10-CM | POA: Diagnosis not present

## 2022-12-08 DIAGNOSIS — Z23 Encounter for immunization: Secondary | ICD-10-CM

## 2022-12-08 DIAGNOSIS — H43813 Vitreous degeneration, bilateral: Secondary | ICD-10-CM | POA: Diagnosis not present

## 2022-12-08 DIAGNOSIS — Z961 Presence of intraocular lens: Secondary | ICD-10-CM | POA: Diagnosis not present

## 2022-12-08 LAB — HM DIABETES EYE EXAM

## 2022-12-19 ENCOUNTER — Other Ambulatory Visit: Payer: Self-pay | Admitting: Family Medicine

## 2022-12-19 DIAGNOSIS — E1122 Type 2 diabetes mellitus with diabetic chronic kidney disease: Secondary | ICD-10-CM

## 2022-12-20 DIAGNOSIS — I442 Atrioventricular block, complete: Secondary | ICD-10-CM | POA: Diagnosis not present

## 2023-01-05 ENCOUNTER — Inpatient Hospital Stay: Payer: 59 | Attending: Oncology

## 2023-01-05 DIAGNOSIS — Z823 Family history of stroke: Secondary | ICD-10-CM | POA: Diagnosis not present

## 2023-01-05 DIAGNOSIS — E1122 Type 2 diabetes mellitus with diabetic chronic kidney disease: Secondary | ICD-10-CM | POA: Insufficient documentation

## 2023-01-05 DIAGNOSIS — I129 Hypertensive chronic kidney disease with stage 1 through stage 4 chronic kidney disease, or unspecified chronic kidney disease: Secondary | ICD-10-CM | POA: Insufficient documentation

## 2023-01-05 DIAGNOSIS — Z8249 Family history of ischemic heart disease and other diseases of the circulatory system: Secondary | ICD-10-CM | POA: Diagnosis not present

## 2023-01-05 DIAGNOSIS — K5730 Diverticulosis of large intestine without perforation or abscess without bleeding: Secondary | ICD-10-CM | POA: Insufficient documentation

## 2023-01-05 DIAGNOSIS — N183 Chronic kidney disease, stage 3 unspecified: Secondary | ICD-10-CM | POA: Insufficient documentation

## 2023-01-05 DIAGNOSIS — Z79899 Other long term (current) drug therapy: Secondary | ICD-10-CM | POA: Diagnosis not present

## 2023-01-05 DIAGNOSIS — D631 Anemia in chronic kidney disease: Secondary | ICD-10-CM | POA: Diagnosis not present

## 2023-01-05 DIAGNOSIS — E785 Hyperlipidemia, unspecified: Secondary | ICD-10-CM | POA: Insufficient documentation

## 2023-01-05 DIAGNOSIS — E538 Deficiency of other specified B group vitamins: Secondary | ICD-10-CM | POA: Diagnosis not present

## 2023-01-05 DIAGNOSIS — Z833 Family history of diabetes mellitus: Secondary | ICD-10-CM | POA: Insufficient documentation

## 2023-01-05 DIAGNOSIS — Z7901 Long term (current) use of anticoagulants: Secondary | ICD-10-CM | POA: Insufficient documentation

## 2023-01-05 LAB — CBC WITH DIFFERENTIAL (CANCER CENTER ONLY)
Abs Immature Granulocytes: 0.03 10*3/uL (ref 0.00–0.07)
Basophils Absolute: 0 10*3/uL (ref 0.0–0.1)
Basophils Relative: 1 %
Eosinophils Absolute: 0.1 10*3/uL (ref 0.0–0.5)
Eosinophils Relative: 2 %
HCT: 33.9 % — ABNORMAL LOW (ref 36.0–46.0)
Hemoglobin: 11 g/dL — ABNORMAL LOW (ref 12.0–15.0)
Immature Granulocytes: 1 %
Lymphocytes Relative: 41 %
Lymphs Abs: 1.5 10*3/uL (ref 0.7–4.0)
MCH: 29.8 pg (ref 26.0–34.0)
MCHC: 32.4 g/dL (ref 30.0–36.0)
MCV: 91.9 fL (ref 80.0–100.0)
Monocytes Absolute: 0.4 10*3/uL (ref 0.1–1.0)
Monocytes Relative: 10 %
Neutro Abs: 1.6 10*3/uL — ABNORMAL LOW (ref 1.7–7.7)
Neutrophils Relative %: 45 %
Platelet Count: 195 10*3/uL (ref 150–400)
RBC: 3.69 MIL/uL — ABNORMAL LOW (ref 3.87–5.11)
RDW: 13.1 % (ref 11.5–15.5)
WBC Count: 3.6 10*3/uL — ABNORMAL LOW (ref 4.0–10.5)
nRBC: 0 % (ref 0.0–0.2)

## 2023-01-05 LAB — IRON AND TIBC
Iron: 82 ug/dL (ref 28–170)
Saturation Ratios: 30 % (ref 10.4–31.8)
TIBC: 276 ug/dL (ref 250–450)
UIBC: 194 ug/dL

## 2023-01-05 LAB — RETIC PANEL
Immature Retic Fract: 5.9 % (ref 2.3–15.9)
RBC.: 3.69 MIL/uL — ABNORMAL LOW (ref 3.87–5.11)
Retic Count, Absolute: 55 10*3/uL (ref 19.0–186.0)
Retic Ct Pct: 1.5 % (ref 0.4–3.1)
Reticulocyte Hemoglobin: 33 pg (ref >27.9)

## 2023-01-05 LAB — CMP (CANCER CENTER ONLY)
ALT: 27 U/L (ref 0–44)
AST: 30 U/L (ref 15–41)
Albumin: 3.9 g/dL (ref 3.5–5.0)
Alkaline Phosphatase: 49 U/L (ref 38–126)
Anion gap: 6 (ref 5–15)
BUN: 15 mg/dL (ref 8–23)
CO2: 23 mmol/L (ref 22–32)
Calcium: 8.5 mg/dL — ABNORMAL LOW (ref 8.9–10.3)
Chloride: 106 mmol/L (ref 98–111)
Creatinine: 1.1 mg/dL — ABNORMAL HIGH (ref 0.44–1.00)
GFR, Estimated: 48 mL/min — ABNORMAL LOW (ref >60)
Glucose, Bld: 126 mg/dL — ABNORMAL HIGH (ref 70–99)
Potassium: 3.5 mmol/L (ref 3.5–5.1)
Sodium: 135 mmol/L (ref 135–145)
Total Bilirubin: 0.9 mg/dL (ref <1.2)
Total Protein: 6.6 g/dL (ref 6.5–8.1)

## 2023-01-05 LAB — FERRITIN: Ferritin: 167 ng/mL (ref 11–307)

## 2023-01-05 LAB — VITAMIN B12: Vitamin B-12: 2804 pg/mL — ABNORMAL HIGH (ref 180–914)

## 2023-01-08 ENCOUNTER — Inpatient Hospital Stay (HOSPITAL_BASED_OUTPATIENT_CLINIC_OR_DEPARTMENT_OTHER): Payer: 59 | Admitting: Oncology

## 2023-01-08 ENCOUNTER — Encounter: Payer: Self-pay | Admitting: Oncology

## 2023-01-08 VITALS — BP 141/58 | HR 82 | Temp 98.7°F | Resp 18 | Wt 109.8 lb

## 2023-01-08 DIAGNOSIS — N189 Chronic kidney disease, unspecified: Secondary | ICD-10-CM | POA: Diagnosis not present

## 2023-01-08 DIAGNOSIS — E785 Hyperlipidemia, unspecified: Secondary | ICD-10-CM | POA: Diagnosis not present

## 2023-01-08 DIAGNOSIS — E538 Deficiency of other specified B group vitamins: Secondary | ICD-10-CM | POA: Diagnosis not present

## 2023-01-08 DIAGNOSIS — N183 Chronic kidney disease, stage 3 unspecified: Secondary | ICD-10-CM | POA: Diagnosis not present

## 2023-01-08 DIAGNOSIS — Z833 Family history of diabetes mellitus: Secondary | ICD-10-CM | POA: Diagnosis not present

## 2023-01-08 DIAGNOSIS — D631 Anemia in chronic kidney disease: Secondary | ICD-10-CM | POA: Diagnosis not present

## 2023-01-08 DIAGNOSIS — E1122 Type 2 diabetes mellitus with diabetic chronic kidney disease: Secondary | ICD-10-CM | POA: Diagnosis not present

## 2023-01-08 DIAGNOSIS — K5730 Diverticulosis of large intestine without perforation or abscess without bleeding: Secondary | ICD-10-CM | POA: Diagnosis not present

## 2023-01-08 DIAGNOSIS — Z8249 Family history of ischemic heart disease and other diseases of the circulatory system: Secondary | ICD-10-CM | POA: Diagnosis not present

## 2023-01-08 DIAGNOSIS — Z823 Family history of stroke: Secondary | ICD-10-CM | POA: Diagnosis not present

## 2023-01-08 DIAGNOSIS — I129 Hypertensive chronic kidney disease with stage 1 through stage 4 chronic kidney disease, or unspecified chronic kidney disease: Secondary | ICD-10-CM | POA: Diagnosis not present

## 2023-01-08 DIAGNOSIS — Z79899 Other long term (current) drug therapy: Secondary | ICD-10-CM | POA: Diagnosis not present

## 2023-01-08 DIAGNOSIS — Z7901 Long term (current) use of anticoagulants: Secondary | ICD-10-CM | POA: Diagnosis not present

## 2023-01-08 NOTE — Assessment & Plan Note (Addendum)
#   Iron deficiency anemia, Labs reviewed and discussed with patient Lab Results  Component Value Date   HGB 11.0 (L) 01/05/2023   TIBC 276 01/05/2023   IRONPCTSAT 30 01/05/2023   FERRITIN 167 01/05/2023     No need for erythropoietin replacement therapy.   Iron panel is stable. Continue oral iron supplementation 2-3 times per week.

## 2023-01-08 NOTE — Progress Notes (Signed)
Hematology/Oncology Progress note [phone] [phone]      Patient Care Team: Alba Cory, MD as PCP - General (Family Medicine) Alwyn Pea, MD as Consulting Physician (Cardiology) Rickard Patience, MD as Consulting Physician (Oncology) Gaspar Cola, [facility] (Inactive) as Pharmacist (Pharmacist) Galen Manila, MD as Referring Physician (Ophthalmology)  ASSESSMENT & PLAN:   Anemia in chronic kidney disease (CKD) # Iron deficiency anemia, Labs reviewed and discussed with patient Lab Results  Component Value Date   HGB 11.0 (L) 01/05/2023   TIBC 276 01/05/2023   IRONPCTSAT 30 01/05/2023   FERRITIN 167 01/05/2023     No need for erythropoietin replacement therapy.   Iron panel is stable. Continue oral iron supplementation 2-3 times per week.   B12 deficiency B12 level is high, stop B12 supplementation 1-2 months, then resume Continue vitamin B12 once  per week.   Orders Placed This Encounter  Procedures   CMP (Cancer Center only)    Standing Status:   Future    Standing Expiration Date:   01/08/2024   CBC with Differential (Cancer Center Only)    Standing Status:   Future    Standing Expiration Date:   01/08/2024   Iron and TIBC    Standing Status:   Future    Standing Expiration Date:   01/08/2024   Ferritin    Standing Status:   Future    Standing Expiration Date:   01/08/2024   Retic Panel    Standing Status:   Future    Standing Expiration Date:   01/08/2024   Vitamin B12    Standing Status:   Future    Standing Expiration Date:   01/08/2024   Follow up in 6 months.  All questions were answered. The patient knows to call the clinic with any problems, questions or concerns.  Rickard Patience, MD, PhD [facility] Hematology Oncology 01/08/2023   REASON FOR VISIT Follow up for treatment of anemia  HISTORY OF PRESENTING ILLNESS:  Emma Campbell is a  89 y.o.  female with PMH listed below who was referred to me for  evaluation of anemia.  Patient follows up with primary care physician and has had labs done recently. 04/17/17 CBC showed hemoglobin 9, MCV 81.9, platelet 245,000, WBC 4.1, normal differential.  Iron panel showed TIBC 442, ferritin 11, saturation 15%. Previous GI workup:  # 01/23/2017 EGD showed non bleeding angiotecsia, treated with APC. 12/22/2016 Capsule study showed duodenum AVM.  11/06/2016 colonoscopy showed non bleeding hemorroids, sigmoid polyp, and diverticulosis.   INTERVAL HISTORY Emma Campbell is a 89 y.o. female who has above history reviewed by me today presents for follow up for management of iron deficiency anemia.  Patient takes oral iron supplementation every other day, and B12 supplementation 3-4 time per week. Sh takes Eliquis 2.5mg  BID for A fib. No bleeding events.  No new complaints.she feels well.   Review of Systems  Constitutional:  Negative for chills, fever, malaise/fatigue and weight loss.  HENT:  Negative for sore throat.   Eyes:  Negative for redness.  Respiratory:  Negative for cough, shortness of breath and wheezing.   Cardiovascular:  Negative for chest pain, palpitations and leg swelling.  Gastrointestinal:  Negative for abdominal pain, blood in stool, nausea and vomiting.  Genitourinary:  Negative for dysuria.  Musculoskeletal:  Negative for myalgias.  Skin:  Negative for rash.  Neurological:  Negative for dizziness, tingling and tremors.  Endo/Heme/Allergies:  Does not bruise/bleed easily.  Psychiatric/Behavioral:  Negative for hallucinations.     MEDICAL HISTORY:  Past Medical History:  Diagnosis Date   Anemia    Chronic kidney disease, stage III (moderate) (HCC)    Diverticulosis    Elevated LFTs    Essential hypertension, malignant    Fibrocystic breast disease    Heart murmur    Hyperlipidemia    Menopausal problem    Osteoarthrosis    Osteoporosis    Peripheral autonomic neuropathy    Presence of permanent cardiac pacemaker    Sick  sinus syndrome (HCC)    Dr. Leslee Home   Type II diabetes mellitus with renal manifestations (HCC)    Vitamin D deficiency     SURGICAL HISTORY: Past Surgical History:  Procedure Laterality Date   CATARACT EXTRACTION W/PHACO Right 01/07/2019   Procedure: CATARACT EXTRACTION PHACO AND INTRAOCULAR LENS PLACEMENT (IOC) RIGHT DIABETIC 02:15.1          25.7%        34.69;  Surgeon: Galen Manila, MD;  Location: [facility];  Service: Ophthalmology;  Laterality: Right;  Diabetic - oral meds   CATARACT EXTRACTION W/PHACO Left 01/28/2019   Procedure: CATARACT EXTRACTION PHACO AND INTRAOCULAR LENS PLACEMENT (IOC) LEFT DIABETIC 12.96  01:15.5;  Surgeon: Galen Manila, MD;  Location: [facility];  Service: Ophthalmology;  Laterality: Left;  Diabetic - oral meds   COLONOSCOPY WITH PROPOFOL N/A 11/06/2016   Procedure: COLONOSCOPY WITH PROPOFOL;  Surgeon: Midge Minium, MD;  Location: [facility];  Service: Endoscopy;  Laterality: N/A;  DIABETIC   ESOPHAGOGASTRODUODENOSCOPY (EGD) WITH PROPOFOL N/A 11/06/2016   Procedure: ESOPHAGOGASTRODUODENOSCOPY (EGD) WITH PROPOFOL;  Surgeon: Midge Minium, MD;  Location: [facility];  Service: Endoscopy;  Laterality: N/A;   ESOPHAGOGASTRODUODENOSCOPY (EGD) WITH PROPOFOL N/A 01/23/2017   Procedure: ESOPHAGOGASTRODUODENOSCOPY (EGD) WITH PROPOFOL with PUSH;  Surgeon: Midge Minium, MD;  Location: [facility] ENDOSCOPY;  Service: Endoscopy;  Laterality: N/A;   GIVENS CAPSULE STUDY N/A 12/22/2016   Procedure: GIVENS CAPSULE STUDY;  Surgeon: Midge Minium, MD;  Location: [facility] END[facility];  Service: Endoscopy;  Laterality: N/A;   INSERT / REPLACE / REMOVE PACEMAKER     PACEMAKER INSERTION  2006   POLYPECTOMY  11/06/2016   Procedure: POLYPECTOMY;  Surgeon: Midge Minium, MD;  Location: [facility];  Service: Endoscopy;;   PPM GENERATOR CHANGEOUT N/A 03/30/2022   Procedure: PPM GENERATOR CHANGEOUT;  Surgeon: Marcina Millard, MD;   Location: [facility] INVASIVE CV LAB;  Service: Cardiovascular;  Laterality: N/A;    SOCIAL HISTORY: Social History   Socioeconomic History   Marital status: Widowed    Spouse name: Onalee Hua   Number of children: 1   Years of education: 2 years of college   Highest education level: 12th grade  Occupational History   Occupation: Retired Human resources officer at [employer]  Tobacco Use   Smoking status: Never   Smokeless tobacco: Never   Tobacco comments:    smoking cessation materials not required  Vaping Use   Vaping status: Never Used  Substance and Sexual Activity   Alcohol use: No    Alcohol/week: 0.0 standard drinks of alcohol   Drug use: No   Sexual activity: Never  Other Topics Concern   Not on file  Social History Narrative   She still has a house but her daughter and grand-daughter are staying at her house   She moved in with her older sister ( 8 years older), to help her out.    Social Determinants of Corporate investment banker  Strain: Low Risk  (10/19/2022)   Overall Financial Resource Strain (CARDIA)    Difficulty of Paying Living Expenses: Not hard at all  Food Insecurity: No Food Insecurity (10/19/2022)   Hunger Vital Sign    Worried About Running Out of Food in the Last Year: Never true    Ran Out of Food in the Last Year: Never true  Transportation Needs: No Transportation Needs (10/19/2022)   PRAPARE - Administrator, Civil Service (Medical): No    Lack of Transportation (Non-Medical): No  Physical Activity: Inactive (10/19/2022)   Exercise Vital Sign    Days of Exercise per Week: 0 days    Minutes of Exercise per Session: 0 min  Stress: No Stress Concern Present (10/19/2022)   [employer] of Occupational Health - Occupational Stress Questionnaire    Feeling of Stress : Not at all  Social Connections: Moderately Isolated (10/19/2022)   Social Connection and Isolation Panel [NHANES]    Frequency of Communication with Friends and Family: More than  three times a week    Frequency of Social Gatherings with Friends and Family: More than three times a week    Attends Religious Services: More than 4 times per year    Active Member of [employer] or Organizations: No    Attends Banker Meetings: Never    Marital Status: Widowed  Intimate Partner Violence: Not At Risk (10/19/2022)   Humiliation, Afraid, Rape, and Kick questionnaire    Fear of Current or Ex-Partner: No    Emotionally Abused: No    Physically Abused: No    Sexually Abused: No    FAMILY HISTORY: Family History  Problem Relation Age of Onset   Hypertension Mother    Stroke Mother    Diabetes Daughter    Stroke Father    Healthy Sister    Hypertension Maternal Grandmother     ALLERGIES:  is allergic to ace inhibitors.  MEDICATIONS:  Current Outpatient Medications  Medication Sig Dispense Refill   ACCU-CHEK GUIDE test strip USE  AS  INSTRUCTED  DAILY 100 strip 2   Accu-Chek Softclix Lancets lancets USE AS DIRECTED EVERY DAY 100 each 2   Alcohol Swabs (DROPSAFE ALCOHOL PREP) 70 % PADS USE AS DIRECTED THREE TIMES DAILY 300 each 0   amiodarone (PACERONE) 200 MG tablet Take 100 mg by mouth daily.     amLODipine-valsartan (EXFORGE) 5-160 MG tablet TAKE 1 TABLET BY MOUTH DAILY 90 tablet 0   apixaban (ELIQUIS) 2.5 MG TABS tablet Take 2.5 mg by mouth daily.     atorvastatin (LIPITOR) 80 MG tablet TAKE 1 TABLET BY MOUTH DAILY 80 tablet 3   Blood Glucose Monitoring Suppl (ACCU-CHEK GUIDE ME) w/Device KIT 1 each by Does not apply route daily. 1 kit 0   cholecalciferol (VITAMIN D) 1000 UNITS tablet Take 1,000 Units by mouth daily.      folic acid (FOLVITE) 400 MCG tablet Take 400 mcg by mouth daily.     Multiple Vitamins-Minerals (CENTRUM SILVER 50+WOMEN PO) Take 1 tablet by mouth daily.     No current facility-administered medications for this visit.     PHYSICAL EXAMINATION: ECOG PERFORMANCE STATUS: 1 - Symptomatic but completely ambulatory Vitals:   01/08/23  1035 01/08/23 1048  BP: (!) 148/64 (!) 141/58  Pulse: 82   Resp: 18   Temp: 98.7 F (37.1 C)    Filed Weights   01/08/23 1035  Weight: 109 lb 12.8 oz (49.8 kg)    Physical Exam  Constitutional:      General: She is not in acute distress.    Appearance: She is not diaphoretic.     Comments: Thin built   HENT:     Head: Normocephalic and atraumatic.  Eyes:     General: No scleral icterus. Neck:     Vascular: No JVD.  Cardiovascular:     Rate and Rhythm: Normal rate.     Heart sounds: Murmur heard.  Pulmonary:     Effort: Pulmonary effort is normal. No respiratory distress.  Abdominal:     General: There is no distension.  Musculoskeletal:        General: Normal range of motion.     Cervical back: Normal range of motion and neck supple.  Lymphadenopathy:     Cervical: No cervical adenopathy.  Skin:    General: Skin is warm and dry.     Findings: No erythema or rash.  Neurological:     Mental Status: She is alert and oriented to person, place, and time. Mental status is at baseline.     Cranial Nerves: No cranial nerve deficit.     Motor: No abnormal muscle tone.  Psychiatric:        Mood and Affect: Affect normal.        Judgment: Judgment normal.      LABORATORY DATA:  I have reviewed the data as listed    Latest Ref Rng & Units 01/05/2023    9:58 AM 06/27/2022   11:09 AM 12/12/2021   11:05 AM  CBC  WBC 4.0 - 10.5 K/uL 3.6  3.6  3.6   Hemoglobin 12.0 - 15.0 g/dL 08.6  57.8  46.9   Hematocrit 36.0 - 46.0 % 33.9  37.9  36.4   Platelets 150 - 400 K/uL 195  174  182       Latest Ref Rng & Units 01/05/2023    9:58 AM 09/28/2022    8:33 AM 09/19/2021   10:08 AM  CMP  Glucose 70 - 99 mg/dL 629  528  413   BUN 8 - 23 mg/dL 15  16  18    Creatinine 0.44 - 1.00 mg/dL 2.44  0.10  2.72   Sodium 135 - 145 mmol/L 135  140  139   Potassium 3.5 - 5.1 mmol/L 3.5  3.9  4.6   Chloride 98 - 111 mmol/L 106  105  103   CO2 22 - 32 mmol/L 23  23  23    Calcium 8.9 - 10.3  mg/dL 8.5  9.3  8.9   Total Protein 6.5 - 8.1 g/dL 6.6  6.7  6.8   Total Bilirubin <1.2 mg/dL 0.9  0.6  0.5   Alkaline Phos 38 - 126 U/L 49     AST 15 - 41 U/L 30  25  20    ALT 0 - 44 U/L 27  20  19     Lab Results  Component Value Date   IRON 82 01/05/2023   TIBC 276 01/05/2023   FERRITIN 167 01/05/2023     SPEP not observed M spike.

## 2023-01-08 NOTE — Assessment & Plan Note (Signed)
B12 level is high, stop B12 supplementation 1-2 months, then resume Continue vitamin B12 once  per week.

## 2023-02-27 ENCOUNTER — Encounter: Payer: Self-pay | Admitting: Family Medicine

## 2023-02-27 ENCOUNTER — Ambulatory Visit (INDEPENDENT_AMBULATORY_CARE_PROVIDER_SITE_OTHER): Payer: 59 | Admitting: Family Medicine

## 2023-02-27 ENCOUNTER — Other Ambulatory Visit: Payer: Self-pay

## 2023-02-27 VITALS — BP 136/70 | HR 89 | Temp 98.0°F | Resp 14 | Ht 61.0 in | Wt 105.6 lb

## 2023-02-27 DIAGNOSIS — E1122 Type 2 diabetes mellitus with diabetic chronic kidney disease: Secondary | ICD-10-CM | POA: Diagnosis not present

## 2023-02-27 DIAGNOSIS — E785 Hyperlipidemia, unspecified: Secondary | ICD-10-CM | POA: Diagnosis not present

## 2023-02-27 DIAGNOSIS — E1169 Type 2 diabetes mellitus with other specified complication: Secondary | ICD-10-CM | POA: Diagnosis not present

## 2023-02-27 DIAGNOSIS — I480 Paroxysmal atrial fibrillation: Secondary | ICD-10-CM

## 2023-02-27 DIAGNOSIS — M8589 Other specified disorders of bone density and structure, multiple sites: Secondary | ICD-10-CM

## 2023-02-27 DIAGNOSIS — I10 Essential (primary) hypertension: Secondary | ICD-10-CM

## 2023-02-27 DIAGNOSIS — E441 Mild protein-calorie malnutrition: Secondary | ICD-10-CM

## 2023-02-27 DIAGNOSIS — I495 Sick sinus syndrome: Secondary | ICD-10-CM | POA: Diagnosis not present

## 2023-02-27 DIAGNOSIS — Z950 Presence of cardiac pacemaker: Secondary | ICD-10-CM | POA: Diagnosis not present

## 2023-02-27 DIAGNOSIS — N1831 Chronic kidney disease, stage 3a: Secondary | ICD-10-CM

## 2023-02-27 DIAGNOSIS — N183 Chronic kidney disease, stage 3 unspecified: Secondary | ICD-10-CM

## 2023-02-27 LAB — POCT GLYCOSYLATED HEMOGLOBIN (HGB A1C): Hemoglobin A1C: 6.7 % — AB (ref 4.0–5.6)

## 2023-02-27 MED ORDER — ATORVASTATIN CALCIUM 80 MG PO TABS: 80.0000 mg | ORAL_TABLET | Freq: Every day | ORAL | 1 refills | Status: DC

## 2023-02-27 MED ORDER — AMLODIPINE BESYLATE-VALSARTAN 5-160 MG PO TABS: 1.0000 | ORAL_TABLET | Freq: Every day | ORAL | 1 refills | Status: DC

## 2023-02-27 NOTE — Progress Notes (Signed)
 Name: Emma Campbell   MRN: [id number]    DOB: Dec 21, 1932   Date:02/27/2023       Progress Note  Subjective  Chief Complaint  Chief Complaint  Patient presents with   Medical Management of Chronic Issues    5 month recheck    HPI  DMII with renal manifestation and also Dyslipidemia : CKI and microalbuminuria. A1C has been well controlled, today is 6.7 % , slightly up , likely from holidays. She has been off Metformin  due to weight loss and glucose has been controlled She denies polyphagia, polydipsia or polyuria . She denies hypoglycemic episodes.Continue ARB for kidney protection and also for HTN . Eye exam is up to date   CKI stage III: she is on ARB ( Exforge  ) bp has been well controlled, DM also controlled , no pruritis and she has normal urine output. Last GFR was stable at 49 .    HTN: taking medication daily and bp is at goal, no dizziness, chest pain or palpitation.  Continue medication    Hyperlipidemia: she has been on Atorvastatin   80 mg daily . Last LDL showed increase of LDL from 70 to 84. We decided to continue current dose and recheck levels today    Sick Sinus Syndrome: she had a pacemaker placed in 2006 and has been on Amiodarone since 2006, she is now down to 100 mg of Amiodarone daily.  No chest pain, dizziness  or palpitation. Sees Dr. Florencio , she is CHAD score of 2, she is now off aspirin and only taking eliquis 2.5 mg , she had a pacemaker replacement January 2024  She also has a history of Afib that has been rate controlled. She  has a history of AVM's  she denies GI bleed , no side effects of medications. TSH has been normal    Vitamin D  deficiency: still taking supplementation, denies fatigue, last Vitamin D  was normal    Osteopenia: last FRAX 12/2021 still has low FRAX score, continue high calcium  diet, physical activity and vitamin D  supplementation . Recheck it next year    Anemia: iron  deficiency and positive hemoccult, seen by Dr. Prentis and had EGD and  colonoscopy September 2018,diagnosed with AVM small bowel and had it cauterized, hemoglobin dropped and was referred to Dr. Babara, She is currently taking iron  twice a week but off B12 since Nov due to high levels.   Patient Active Problem List   Diagnosis Date Noted   Dyslipidemia associated with type 2 diabetes mellitus (HCC) 09/28/2022   Anemia in chronic kidney disease (CKD) 12/26/2021   Osteopenia of multiple sites 09/19/2021   B12 deficiency 05/21/2017   Gastrointestinal tract imaging abnormality    Iron  deficiency anemia    Polyp of sigmoid colon    Arthritis, degenerative 05/03/2015   Menopausal and perimenopausal disorder 05/03/2015   Chronic kidney disease (CKD), stage III (moderate) (HCC) 11/30/2014   Diverticulosis 11/30/2014   Fibrocystic breast disease 11/30/2014   Elevated LFTs 11/30/2014   Systolic ejection murmur 11/30/2014   Paroxysmal A-fib (HCC) 11/30/2014   Microalbuminuria 07/31/2014   Elevated TSH 07/31/2014   Pacemaker 07/31/2014   Hypercholesteremia 08/13/2013   Hypertension, benign 08/13/2013   Type 2 diabetes mellitus with stage 3 chronic kidney disease and hypertension (HCC) 08/13/2013   Vitamin D  deficiency 04/12/2009    Past Surgical History:  Procedure Laterality Date   CATARACT EXTRACTION W/PHACO Right 01/07/2019   Procedure: CATARACT EXTRACTION PHACO AND INTRAOCULAR LENS PLACEMENT (IOC) RIGHT DIABETIC 02:15.1  25.7%        34.69;  Surgeon: Jaye Fallow, MD;  [facility]: [facility];  Service: Ophthalmology;  Laterality: Right;  Diabetic - oral meds   CATARACT EXTRACTION W/PHACO Left 01/28/2019   Procedure: CATARACT EXTRACTION PHACO AND INTRAOCULAR LENS PLACEMENT (IOC) LEFT DIABETIC 12.96  01:15.5;  Surgeon: Jaye Fallow, MD;  Location: [facility];  Service: Ophthalmology;  Laterality: Left;  Diabetic - oral meds   COLONOSCOPY WITH PROPOFOL  N/A 11/06/2016   Procedure: COLONOSCOPY WITH PROPOFOL ;  Surgeon: Jinny Carmine, MD;   Location: [facility];  Service: Endoscopy;  Laterality: N/A;  DIABETIC   ESOPHAGOGASTRODUODENOSCOPY (EGD) WITH PROPOFOL  N/A 11/06/2016   Procedure: ESOPHAGOGASTRODUODENOSCOPY (EGD) WITH PROPOFOL ;  Surgeon: Jinny Carmine, MD;  Location: [facility];  Service: Endoscopy;  Laterality: N/A;   ESOPHAGOGASTRODUODENOSCOPY (EGD) WITH PROPOFOL  N/A 01/23/2017   Procedure: ESOPHAGOGASTRODUODENOSCOPY (EGD) WITH PROPOFOL  with PUSH;  Surgeon: Jinny Carmine, MD;  Location: [facility] ENDOSCOPY;  Service: Endoscopy;  Laterality: N/A;   GIVENS CAPSULE STUDY N/A 12/22/2016   Procedure: GIVENS CAPSULE STUDY;  Surgeon: Jinny Carmine, MD;  Location: [facility] END[facility]COPY;  Service: Endoscopy;  Laterality: N/A;   INSERT / REPLACE / REMOVE PACEMAKER     PACEMAKER INSERTION  2006   POLYPECTOMY  11/06/2016   Procedure: POLYPECTOMY;  Surgeon: Jinny Carmine, MD;  Location: [facility];  Service: Endoscopy;;   PPM GENERATOR CHANGEOUT N/A 03/30/2022   Procedure: PPM GENERATOR CHANGEOUT;  Surgeon: Ammon Blunt, MD;  Location: [facility] INVASIVE CV LAB;  Service: Cardiovascular;  Laterality: N/A;    Family History  Problem Relation Age of Onset   Hypertension Mother    Stroke Mother    Diabetes Daughter    Stroke Father    Healthy Sister    Hypertension Maternal Grandmother     Social History   Tobacco Use   Smoking status: Never   Smokeless tobacco: Never   Tobacco comments:    smoking cessation materials not required  Substance Use Topics   Alcohol  use: No    Alcohol /week: 0.0 standard drinks of alcohol      Current Outpatient Medications:    amiodarone (PACERONE) 200 MG tablet, Take 100 mg by mouth daily., Disp: , Rfl:    amLODipine -valsartan  (EXFORGE ) 5-160 MG tablet, TAKE 1 TABLET BY MOUTH DAILY, Disp: 90 tablet, Rfl: 0   apixaban (ELIQUIS) 2.5 MG TABS tablet, Take 2.5 mg by mouth daily., Disp: , Rfl:    atorvastatin  (LIPITOR) 80 MG tablet, TAKE 1 TABLET BY MOUTH DAILY, Disp: 80 tablet, Rfl:  3   cholecalciferol (VITAMIN D ) 1000 UNITS tablet, Take 1,000 Units by mouth daily. , Disp: , Rfl:    folic acid  (FOLVITE ) 400 MCG tablet, Take 400 mcg by mouth daily., Disp: , Rfl:    Multiple Vitamins-Minerals (CENTRUM SILVER 50+WOMEN PO), Take 1 tablet by mouth daily., Disp: , Rfl:    ACCU-CHEK GUIDE test strip, USE  AS  INSTRUCTED  DAILY (Patient not taking: Reported on 02/27/2023), Disp: 100 strip, Rfl: 2   Accu-Chek Softclix Lancets lancets, USE AS DIRECTED EVERY DAY (Patient not taking: Reported on 02/27/2023), Disp: 100 each, Rfl: 2   Alcohol  Swabs  (DROPSAFE ALCOHOL  PREP) 70 % PADS, USE AS DIRECTED THREE TIMES DAILY (Patient not taking: Reported on 02/27/2023), Disp: 300 each, Rfl: 0   Blood Glucose Monitoring Suppl (ACCU-CHEK GUIDE ME) w/Device KIT, 1 each by Does not apply route daily. (Patient not taking: Reported on 02/27/2023), Disp: 1 kit, Rfl: 0  Allergies  Allergen Reactions  Ace Inhibitors Other (See Comments)    Pt unable to recall. Was advised to avoid    I personally reviewed active problem list, medication list, allergies, family history with the patient/caregiver today.   ROS  Ten systems reviewed and is negative except as mentioned in HPI    Objective  Vitals:   02/27/23 0939  BP: 136/70  Pulse: 89  Resp: 14  Temp: 98 F (36.7 C)  TempSrc: Oral  SpO2: 98%  Weight: 105 lb 9.6 oz (47.9 kg)  Height: 5' 1 (1.549 m)    Body mass index is 19.95 kg/m.  Physical Exam  Constitutional: Patient appears well-developed and well-nourished.  No distress.  HEENT: head atraumatic, normocephalic, pupils equal and reactive to light, neck supple Cardiovascular: Normal rate, regular rhythm, SEM 4/6 No BLE edema. Pulmonary/Chest: Effort normal and breath sounds normal. No respiratory distress. Abdominal: Soft.  There is no tenderness. Psychiatric: Patient has a normal mood and affect. behavior is normal. Judgment and thought content normal.   Recent Results (from  the past 2160 hours)  HM DIABETES EYE EXAM     Status: None   Collection Time: 12/08/22 10:54 AM  Result Value Ref Range   HM Diabetic Eye Exam No Retinopathy No Retinopathy    Comment: ABSTRACTED BY HIM  Vitamin B12     Status: Abnormal   Collection Time: 01/05/23  9:57 AM  Result Value Ref Range   Vitamin B-12 2,804 (H) 180 - 914 pg/mL    Comment: (NOTE) This assay is not validated for testing neonatal or myeloproliferative syndrome specimens for Vitamin B12 levels. Performed at [facility], [address], [place], [place] [address]   Retic Panel     Status: Abnormal   Collection Time: 01/05/23  9:58 AM  Result Value Ref Range   Retic Ct Pct 1.5 0.4 - 3.1 %   RBC. 3.69 (L) 3.87 - 5.11 MIL/uL   Retic Count, Absolute 55.0 19.0 - 186.0 K/uL   Immature Retic Fract 5.9 2.3 - 15.9 %   Reticulocyte Hemoglobin 33.0 >27.9 pg    Comment:        Given the high negative predictive value of a RET-He result > 32 pg iron  deficiency is essentially excluded. If this patient is anemic other etiologies should be considered. Performed at [facility], [address] [place], [place] [address]   Ferritin     Status: None   Collection Time: 01/05/23  9:58 AM  Result Value Ref Range   Ferritin 167 11 - 307 ng/mL    Comment: Performed at [facility], [address] [place], [place] [address]  Iron  and TIBC     Status: None   Collection Time: 01/05/23  9:58 AM  Result Value Ref Range   Iron  82 28 - 170 ug/dL   TIBC 723 749 - 549 ug/dL   Saturation Ratios 30 10.4 - 31.8 %   UIBC 194 ug/dL    Comment: Performed at [facility], [address] [place], [place] [address]  CMP (Cancer Center only)     Status: Abnormal   Collection Time: 01/05/23  9:58 AM  Result Value Ref Range   Sodium 135 135 - 145 mmol/L   Potassium 3.5 3.5 - 5.1 mmol/L   Chloride 106 98 - 111 mmol/L   CO2 23 22 - 32 mmol/L   Glucose, Bld 126 (H) 70 - 99 mg/dL    Comment: Glucose  reference range applies only to  samples taken after fasting for at least 8 hours.   BUN 15 8 - 23 mg/dL   Creatinine 8.89 (H) 9.55 - 1.00 mg/dL   Calcium  8.5 (L) 8.9 - 10.3 mg/dL   Total Protein 6.6 6.5 - 8.1 g/dL   Albumin 3.9 3.5 - 5.0 g/dL   AST 30 15 - 41 U/L   ALT 27 0 - 44 U/L   Alkaline Phosphatase 49 38 - 126 U/L   Total Bilirubin 0.9 <1.2 mg/dL   GFR, Estimated 48 (L) >60 mL/min    Comment: (NOTE) Calculated using the CKD-EPI Creatinine Equation (2021)    Anion gap 6 5 - 15    Comment: Performed at [facility], [address] [place], [place] [address]  CBC with Differential (Cancer Center Only)     Status: Abnormal   Collection Time: 01/05/23  9:58 AM  Result Value Ref Range   WBC Count 3.6 (L) 4.0 - 10.5 K/uL   RBC 3.69 (L) 3.87 - 5.11 MIL/uL   Hemoglobin 11.0 (L) 12.0 - 15.0 g/dL   HCT 66.0 (L) 63.9 - 53.9 %   MCV 91.9 80.0 - 100.0 fL   MCH 29.8 26.0 - 34.0 pg   MCHC 32.4 30.0 - 36.0 g/dL   RDW 86.8 88.4 - 84.4 %   Platelet Count 195 150 - 400 K/uL   nRBC 0.0 0.0 - 0.2 %   Neutrophils Relative % 45 %   Neutro Abs 1.6 (L) 1.7 - 7.7 K/uL   Lymphocytes Relative 41 %   Lymphs Abs 1.5 0.7 - 4.0 K/uL   Monocytes Relative 10 %   Monocytes Absolute 0.4 0.1 - 1.0 K/uL   Eosinophils Relative 2 %   Eosinophils Absolute 0.1 0.0 - 0.5 K/uL   Basophils Relative 1 %   Basophils Absolute 0.0 0.0 - 0.1 K/uL   Immature Granulocytes 1 %   Abs Immature Granulocytes 0.03 0.00 - 0.07 K/uL    Comment: Performed at [facility], [address] [place], [place] [address]    Diabetic Foot Exam:     PHQ2/9:    10/19/2022    3:07 PM 09/28/2022    7:48 AM 03/22/2022    8:43 AM 09/22/2021    1:00 PM 09/19/2021    9:38 AM  Depression screen PHQ 2/9  Decreased Interest 0 0 0 0 0  Down, Depressed, Hopeless 0 0 0 0 0  PHQ - 2 Score 0 0 0 0 0  Altered sleeping  0 0 0 0  Tired, decreased energy  0 0 0 0  Change in appetite  0 0 0 0  Feeling bad or failure about  yourself   0 0 0 0  Trouble concentrating  0 0 0 0  Moving slowly or fidgety/restless  0 0 0 0  Suicidal thoughts  0 0 0 0  PHQ-9 Score  0 0 0 0  Difficult doing work/chores  Not difficult at all       phq 9 is negative   Fall Risk:    02/27/2023    9:40 AM 10/19/2022    3:04 PM 09/28/2022    7:48 AM 03/22/2022    8:43 AM 09/22/2021    1:03 PM  Fall Risk   Falls in the past year? 0 0 0 0 0  Number falls in past yr: 0 0 0    Injury with Fall? 0 0 0    Risk for fall due to : No Fall  Risks No Fall Risks No Fall Risks No Fall Risks No Fall Risks  Follow up Falls evaluation completed Education provided;Falls prevention discussed Falls prevention discussed;Education provided;Falls evaluation completed Falls prevention discussed;Education provided;Falls evaluation completed Falls prevention discussed;Education provided;Falls evaluation completed      Assessment & Plan  1. Controlled type 2 diabetes mellitus with stage 3 chronic kidney disease, without long-term current use of insulin (HCC) (Primary)  - amLODipine -valsartan  (EXFORGE ) 5-160 MG tablet; Take 1 tablet by mouth daily.  Dispense: 90 tablet; Refill: 1  2. Dyslipidemia associated with type 2 diabetes mellitus (HCC)  - atorvastatin  (LIPITOR) 80 MG tablet; Take 1 tablet (80 mg total) by mouth daily.  Dispense: 90 tablet; Refill: 1  3. Paroxysmal A-fib (HCC)  Denies symptoms, on amiodarone, eliquis given by cardiologist   4. Sick sinus syndrome [facility])  She has a pacemaker, up to date with cardiologist   5. Mild protein-calorie malnutrition (HCC)  She lost another 5 lbs since last visit, discussed importance of checking weight weekly at home, must gain it back by next visit. Discussed cheese, humus, peanut butter for snacks  6. Osteopenia of multiple sites  Continue vitamin D  and high calcium  diet   7. Hypertension, benign  Bp is at goal   8. Pacemaker  Monitored by cardiologist      9. Stage 3a chronic kidney  disease (HCC)  Stable, denies pruritus

## 2023-04-13 ENCOUNTER — Ambulatory Visit: Payer: Self-pay

## 2023-04-13 NOTE — Telephone Encounter (Signed)
Spoke with pt and she will call back if need. She vomited a few minutes ago and is feeling a little better

## 2023-04-13 NOTE — Telephone Encounter (Signed)
Summary: Nausea Advice   Pt is calling to report nausea with no vomitting, no dizziness. Pt declined apptointment. Please advise       Called pt - left message on machine.  Unable to contact pt after 3 tries. I will forward to clinic for follow up.

## 2023-04-13 NOTE — Telephone Encounter (Signed)
Summary: Nausea Advice   Pt is calling to report nausea with no vomitting, no dizziness. Pt declined apptointment. Please advise      Called pt - left message on machine asking pt to return call.

## 2023-04-13 NOTE — Telephone Encounter (Signed)
Summary: Nausea Advice   Pt is calling to report nausea with no vomitting, no dizziness. Pt declined apptointment. Please advise     Called pt - left message on machine.

## 2023-04-13 NOTE — Telephone Encounter (Signed)
Pt need an appointment

## 2023-04-18 ENCOUNTER — Other Ambulatory Visit: Payer: Self-pay | Admitting: Family Medicine

## 2023-04-18 DIAGNOSIS — N183 Chronic kidney disease, stage 3 unspecified: Secondary | ICD-10-CM

## 2023-04-18 NOTE — Telephone Encounter (Signed)
Copied from CRM [id number]. Topic: Clinical - Medication Refill >> Apr 18, 2023  9:32 AM Antony Haste wrote: Most Recent Primary Care Visit:  Provider: Alba Cory  Department: [facility]  Visit Type: OFFICE VISIT  Date: 02/27/2023  Medication: amLODipine-valsartan (EXFORGE) 5-160 MG tablet   Has the patient contacted their pharmacy? Yes (Agent: If no, request that the patient contact the pharmacy for the refill. If patient does not wish to contact the pharmacy document the reason why and proceed with request.) (Agent: If yes, when and what did the pharmacy advise?)  Is this the correct pharmacy for this prescription? Yes If no, delete pharmacy and type the correct one.  This is the patient's preferred pharmacy:   [facility] [address]), [place] - [address] [place]) [place] [address] Phone: [phone] Fax: [phone]   Has the prescription been filled recently? No  Is the patient out of the medication? Yes  Has the patient been seen for an appointment in the last year OR does the patient have an upcoming appointment? Yes  Can we respond through MyChart? No, callback preferred.  Agent: Please be advised that Rx refills may take up to 3 business days. We ask that you follow-up with your pharmacy.

## 2023-04-19 NOTE — Telephone Encounter (Signed)
Refilled 02/27/23 # 90 with 1 refill. Requested Prescriptions  Refused Prescriptions Disp Refills   amLODipine-valsartan (EXFORGE) 5-160 MG tablet 90 tablet 1    Sig: Take 1 tablet by mouth daily.     Cardiovascular: CCB + ARB Combos Failed - 04/19/2023  7:39 AM      Failed - Cr in normal range and within 180 days    Creatinine  Date Value Ref Range Status  01/05/2023 1.10 (H) 0.44 - 1.00 mg/dL Final   Creat  Date Value Ref Range Status  09/28/2022 1.09 (H) 0.60 - 0.95 mg/dL Final   Creatinine, Urine  Date Value Ref Range Status  09/28/2022 104 20 - 275 mg/dL Final         Passed - K in normal range and within 180 days    Potassium  Date Value Ref Range Status  01/05/2023 3.5 3.5 - 5.1 mmol/L Final         Passed - Na in normal range and within 180 days    Sodium  Date Value Ref Range Status  01/05/2023 135 135 - 145 mmol/L Final  11/30/2014 140 134 - 144 mmol/L Final    Comment:    **Effective December 14, 2014 the reference interval**   for Sodium, Serum will be changing to:                                             136 - 144          Passed - Patient is not pregnant      Passed - Last BP in normal range    BP Readings from Last 1 Encounters:  02/27/23 136/70         Passed - Valid encounter within last 6 months    Recent Outpatient Visits           1 month ago Controlled type 2 diabetes mellitus with stage 3 chronic kidney disease, without long-term current use of insulin (HCC)   [place] [facility] Alba Cory, MD   6 months ago Controlled type 2 diabetes mellitus with stage 3 chronic kidney disease, without long-term current use of insulin (HCC)   [place] [facility], Danna Hefty, MD   1 year ago Controlled type 2 diabetes mellitus with stage 3 chronic kidney disease, without long-term current use of insulin [facility])   [place] [facility] [place], Danna Hefty, MD   1 year ago Controlled type 2  diabetes mellitus with stage 3 chronic kidney disease, without long-term current use of insulin [facility])   [place] [facility] [place], Danna Hefty, MD   2 years ago Type 2 diabetes mellitus with stage 3 chronic kidney disease and hypertension [facility])   [place] [facility] Alba Cory, MD       Future Appointments             In 2 months Carlynn Purl, Danna Hefty, MD [facility], [facility]

## 2023-04-25 ENCOUNTER — Other Ambulatory Visit: Payer: Self-pay | Admitting: Family Medicine

## 2023-04-25 DIAGNOSIS — Z5181 Encounter for therapeutic drug level monitoring: Secondary | ICD-10-CM | POA: Diagnosis not present

## 2023-04-25 DIAGNOSIS — I480 Paroxysmal atrial fibrillation: Secondary | ICD-10-CM | POA: Diagnosis not present

## 2023-04-25 DIAGNOSIS — N183 Chronic kidney disease, stage 3 unspecified: Secondary | ICD-10-CM | POA: Diagnosis not present

## 2023-04-25 DIAGNOSIS — Z950 Presence of cardiac pacemaker: Secondary | ICD-10-CM | POA: Diagnosis not present

## 2023-04-25 DIAGNOSIS — Z79899 Other long term (current) drug therapy: Secondary | ICD-10-CM | POA: Diagnosis not present

## 2023-04-25 DIAGNOSIS — I350 Nonrheumatic aortic (valve) stenosis: Secondary | ICD-10-CM | POA: Diagnosis not present

## 2023-04-25 DIAGNOSIS — I10 Essential (primary) hypertension: Secondary | ICD-10-CM | POA: Diagnosis not present

## 2023-04-25 DIAGNOSIS — I495 Sick sinus syndrome: Secondary | ICD-10-CM | POA: Diagnosis not present

## 2023-04-25 DIAGNOSIS — R001 Bradycardia, unspecified: Secondary | ICD-10-CM | POA: Diagnosis not present

## 2023-04-25 DIAGNOSIS — E1122 Type 2 diabetes mellitus with diabetic chronic kidney disease: Secondary | ICD-10-CM

## 2023-04-25 DIAGNOSIS — E7800 Pure hypercholesterolemia, unspecified: Secondary | ICD-10-CM | POA: Diagnosis not present

## 2023-04-25 DIAGNOSIS — R011 Cardiac murmur, unspecified: Secondary | ICD-10-CM | POA: Diagnosis not present

## 2023-04-25 NOTE — Telephone Encounter (Signed)
 Copied from CRM [id number]. Topic: Clinical - Medication Refill >> Apr 25, 2023  1:20 PM Higinio Roger wrote: Most Recent Primary Care Visit:  Provider: Alba Cory  Department: [facility]  Visit Type: OFFICE VISIT  Date: 02/27/2023  Medication: amLODipine-valsartan (EXFORGE) 5-160 MG tablet   Has the patient contacted their pharmacy? No (Agent: If no, request that the patient contact the pharmacy for the refill. If patient does not wish to contact the pharmacy document the reason why and proceed with request.) Patient wanted Dr. Carlynn Purl to send in refill  (Agent: If yes, when and what did the pharmacy advise?)  Is this the correct pharmacy for this prescription? Yes If no, delete pharmacy and type the correct one.  This is the patient's preferred pharmacy:   [facility] [address]), [place] - [address] [place]) [place] [address] Phone: [phone] Fax: [phone]  Has the prescription been filled recently? No  Is the patient out of the medication? No. Patient has 3 pills remaining but will be  Has the patient been seen for an appointment in the last year OR does the patient have an upcoming appointment? Yes  Can we respond through MyChart? Yes  Agent: Please be advised that Rx refills may take up to 3 business days. We ask that you follow-up with your pharmacy.

## 2023-05-04 ENCOUNTER — Other Ambulatory Visit: Payer: Self-pay | Admitting: Family Medicine

## 2023-05-04 DIAGNOSIS — N183 Chronic kidney disease, stage 3 unspecified: Secondary | ICD-10-CM

## 2023-05-04 MED ORDER — AMLODIPINE BESYLATE-VALSARTAN 5-160 MG PO TABS: 1.0000 | ORAL_TABLET | Freq: Every day | ORAL | 0 refills | Status: DC

## 2023-05-04 NOTE — Telephone Encounter (Signed)
 Copied from CRM [id number]. Topic: Clinical - Medication Refill >> May 04, 2023 10:50 AM Gildardo Pounds wrote: Most Recent Primary Care Visit:  Provider: Alba Cory  Department: [facility]  Visit Type: OFFICE VISIT  Date: 02/27/2023  Medication: amLODipine-valsartan (EXFORGE) 5-160 MG tablet  Has the patient contacted their pharmacy? Yes (Agent: If no, request that the patient contact the pharmacy for the refill. If patient does not wish to contact the pharmacy document the reason why and proceed with request.) (Agent: If yes, when and what did the pharmacy advise?)  Is this the correct pharmacy for this prescription? Yes If no, delete pharmacy and type the correct one.  This is the patient's preferred pharmacy:  [facility] [address]), [place] - [address] [place]) [place] [address] Phone: [phone] Fax: [phone]  Has the prescription been filled recently? No  Is the patient out of the medication? Yes  Has the patient been seen for an appointment in the last year OR does the patient have an upcoming appointment? Yes  Can we respond through MyChart? No  Agent: Please be advised that Rx refills may take up to 3 business days. We ask that you follow-up with your pharmacy.

## 2023-05-04 NOTE — Telephone Encounter (Signed)
 Requested Prescriptions  Pending Prescriptions Disp Refills   amLODipine-valsartan (EXFORGE) 5-160 MG tablet 90 tablet 0    Sig: Take 1 tablet by mouth daily.     Cardiovascular: CCB + ARB Combos Failed - 05/04/2023  4:52 PM      Failed - Cr in normal range and within 180 days    Creatinine  Date Value Ref Range Status  01/05/2023 1.10 (H) 0.44 - 1.00 mg/dL Final   Creat  Date Value Ref Range Status  09/28/2022 1.09 (H) 0.60 - 0.95 mg/dL Final   Creatinine, Urine  Date Value Ref Range Status  09/28/2022 104 20 - 275 mg/dL Final         Passed - K in normal range and within 180 days    Potassium  Date Value Ref Range Status  01/05/2023 3.5 3.5 - 5.1 mmol/L Final         Passed - Na in normal range and within 180 days    Sodium  Date Value Ref Range Status  01/05/2023 135 135 - 145 mmol/L Final  11/30/2014 140 134 - 144 mmol/L Final    Comment:    **Effective December 14, 2014 the reference interval**   for Sodium, Serum will be changing to:                                             136 - 144          Passed - Patient is not pregnant      Passed - Last BP in normal range    BP Readings from Last 1 Encounters:  02/27/23 136/70         Passed - Valid encounter within last 6 months    Recent Outpatient Visits           2 months ago Controlled type 2 diabetes mellitus with stage 3 chronic kidney disease, without long-term current use of insulin (HCC)   [facility] Al[facility] Cory, MD   7 months ago Controlled type 2 diabetes mellitus with stage 3 chronic kidney disease, without long-term current use of insulin (HCC)   [place] [facility] [place], Danna Hefty, MD   1 year ago Controlled type 2 diabetes mellitus with stage 3 chronic kidney disease, without long-term current use of insulin [facility])   [place] [facility] [place], Danna Hefty, MD   1 year ago Controlled type 2 diabetes mellitus with stage 3 chronic kidney  disease, without long-term current use of insulin [facility])   [place] [facility] [place], Danna Hefty, MD   2 years ago Type 2 diabetes mellitus with stage 3 chronic kidney disease and hypertension [facility])   [place] [facility] Alba Cory, MD       Future Appointments             In 1 month Carlynn Purl, Danna Hefty, MD [facility], [facility]

## 2023-05-04 NOTE — Telephone Encounter (Signed)
 Pt should have a refill available. Called pharmacy - no answer at pharmacy.

## 2023-06-27 ENCOUNTER — Ambulatory Visit: Payer: Self-pay | Admitting: Family Medicine

## 2023-06-27 ENCOUNTER — Encounter: Payer: Self-pay | Admitting: Family Medicine

## 2023-06-27 VITALS — BP 138/74 | HR 85 | Resp 16 | Ht 61.0 in | Wt 101.3 lb

## 2023-06-27 DIAGNOSIS — N1831 Chronic kidney disease, stage 3a: Secondary | ICD-10-CM | POA: Diagnosis not present

## 2023-06-27 DIAGNOSIS — E785 Hyperlipidemia, unspecified: Secondary | ICD-10-CM

## 2023-06-27 DIAGNOSIS — D500 Iron deficiency anemia secondary to blood loss (chronic): Secondary | ICD-10-CM

## 2023-06-27 DIAGNOSIS — N183 Chronic kidney disease, stage 3 unspecified: Secondary | ICD-10-CM

## 2023-06-27 DIAGNOSIS — M8589 Other specified disorders of bone density and structure, multiple sites: Secondary | ICD-10-CM

## 2023-06-27 DIAGNOSIS — E559 Vitamin D deficiency, unspecified: Secondary | ICD-10-CM

## 2023-06-27 DIAGNOSIS — E538 Deficiency of other specified B group vitamins: Secondary | ICD-10-CM

## 2023-06-27 DIAGNOSIS — E1122 Type 2 diabetes mellitus with diabetic chronic kidney disease: Secondary | ICD-10-CM | POA: Diagnosis not present

## 2023-06-27 DIAGNOSIS — E1169 Type 2 diabetes mellitus with other specified complication: Secondary | ICD-10-CM

## 2023-06-27 DIAGNOSIS — I495 Sick sinus syndrome: Secondary | ICD-10-CM

## 2023-06-27 DIAGNOSIS — I480 Paroxysmal atrial fibrillation: Secondary | ICD-10-CM

## 2023-06-27 LAB — POCT GLYCOSYLATED HEMOGLOBIN (HGB A1C): Hemoglobin A1C: 6.1 % — AB (ref 4.0–5.6)

## 2023-06-27 MED ORDER — AMLODIPINE BESYLATE-VALSARTAN 5-160 MG PO TABS: 1.0000 | ORAL_TABLET | Freq: Every day | ORAL | 0 refills | Status: DC

## 2023-06-27 MED ORDER — ATORVASTATIN CALCIUM 80 MG PO TABS: 80.0000 mg | ORAL_TABLET | Freq: Every day | ORAL | 1 refills | Status: DC

## 2023-06-27 NOTE — Progress Notes (Signed)
 Name: Emma Campbell   MRN: [id number]    DOB: [birth year]   Date:06/27/2023       Progress Note  Subjective  Chief Complaint  Chief Complaint  Patient presents with   Medical Management of Chronic Issues   HPI   DMII with renal manifestation and also Dyslipidemia : CKI and microalbuminuria. A1C has been well controlled, today is 6.1% down from 6.7 % She has been off Metformin  due to weight loss and glucose has been controlled She denies polyphagia, polydipsia or polyuria . She denies hypoglycemic episodes.Continue ARB for CKI and also for HTN . Eye exam is up to date   CKI stage III: she is on ARB ( Exforge  ) bp has been well controlled, DM also controlled , no pruritis and she has normal urine output. Last GFR was stable at 49 and we will recheck next visit    HTN: taking medication daily and bp is at goal, no dizziness, chest pain or palpitation. She has not been checking it at home    Hyperlipidemia: she has been on Atorvastatin   80 mg daily . Last LDL showed increase of LDL from 70 to 84. We decided to continue current dose and recheck levels next visit    Sick Sinus Syndrome/Afib: she had a pacemaker placed in 2006 and has been on Amiodarone since 2006, she is now down to 100 mg of Amiodarone daily.  No chest pain, dizziness  or palpitation. Sees Dr. Beau Bound , she is [place] score of 2, she is now off aspirin and only taking eliquis 2.5 mg , she had a pacemaker replacement January 2024  She also has Afib that has been rate controlled. She  has a history of AVM's  she denies GI bleed , no side effects of medications. TSH has been normal    Vitamin D  deficiency: still taking supplementation, denies fatigue, last Vitamin D  was normal    Osteopenia: last FRAX 12/2021 still has low FRAX score, continue high calcium  diet, physical activity and vitamin D  supplementation . Recheck nov 2025   Anemia: iron  deficiency and positive hemoccult, seen by Dr. Barnie Bora and had EGD and colonoscopy September  2018,diagnosed with AVM small bowel and had it cauterized, hemoglobin dropped and was referred to Dr. Wilhelmenia Harada, She is currently taking iron  twice a week  and B12 once a week now, last hemoglobin was 11, she has upcoming visit with Dr. Wilhelmenia Harada in May   Patient Active Problem List   Diagnosis Date Noted   Dyslipidemia associated with type 2 diabetes mellitus (HCC) 09/28/2022   Osteopenia of multiple sites 09/19/2021   B12 deficiency 05/21/2017   Gastrointestinal tract imaging abnormality    Iron  deficiency anemia    Polyp of sigmoid colon    Arthritis, degenerative 05/03/2015   Menopausal and perimenopausal disorder 05/03/2015   Chronic kidney disease (CKD), stage III (moderate) (HCC) 11/30/2014   Diverticulosis 11/30/2014   Fibrocystic breast disease 11/30/2014   Systolic ejection murmur 11/30/2014   Paroxysmal A-fib (HCC) 11/30/2014   Microalbuminuria 07/31/2014   Pacemaker 07/31/2014   Hypercholesteremia 08/13/2013   Hypertension, benign 08/13/2013   Type 2 diabetes mellitus with stage 3 chronic kidney disease and hypertension (HCC) 08/13/2013   Vitamin D  deficiency 04/12/2009    Past Surgical History:  Procedure Laterality Date   CATARACT EXTRACTION W/PHACO Right 01/07/2019   Procedure: CATARACT EXTRACTION PHACO AND INTRAOCULAR LENS PLACEMENT (IOC) RIGHT DIABETIC 02:15.1          25.7%  34.69;  Surgeon: Clair Crews, MD;  Location: [facility];  Service: Ophthalmology;  Laterality: Right;  Diabetic - oral meds   CATARACT EXTRACTION W/PHACO Left 01/28/2019   Procedure: CATARACT EXTRACTION PHACO AND INTRAOCULAR LENS PLACEMENT (IOC) LEFT DIABETIC 12.96  01:15.5;  Surgeon: Clair Crews, MD;  Location: [facility];  Service: Ophthalmology;  Laterality: Left;  Diabetic - oral meds   COLONOSCOPY WITH PROPOFOL  N/A 11/06/2016   Procedure: COLONOSCOPY WITH PROPOFOL ;  Surgeon: Marnee Sink, MD;  Location: [facility];  Service: Endoscopy;  Laterality: N/A;  DIABETIC    ESOPHAGOGASTRODUODENOSCOPY (EGD) WITH PROPOFOL  N/A 11/06/2016   Procedure: ESOPHAGOGASTRODUODENOSCOPY (EGD) WITH PROPOFOL ;  Surgeon: Marnee Sink, MD;  Location: [facility];  Service: Endoscopy;  Laterality: N/A;   ESOPHAGOGASTRODUODENOSCOPY (EGD) WITH PROPOFOL  N/A 01/23/2017   Procedure: ESOPHAGOGASTRODUODENOSCOPY (EGD) WITH PROPOFOL  with PUSH;  Surgeon: Marnee Sink, MD;  Location: [facility] ENDOSCOPY;  Service: Endoscopy;  Laterality: N/A;   GIVENS CAPSULE STUDY N/A 12/22/2016   Procedure: GIVENS CAPSULE STUDY;  Surgeon: Marnee Sink, MD;  Location: [facility] END[facility]COPY;  Service: Endoscopy;  Laterality: N/A;   INSERT / REPLACE / REMOVE PACEMAKER     PACEMAKER INSERTION  2006   POLYPECTOMY  11/06/2016   Procedure: POLYPECTOMY;  Surgeon: Marnee Sink, MD;  Location: [facility];  Service: Endoscopy;;   PPM GENERATOR CHANGEOUT N/A 03/30/2022   Procedure: PPM GENERATOR CHANGEOUT;  Surgeon: Percival Brace, MD;  Location: [facility] INVASIVE CV LAB;  Service: Cardiovascular;  Laterality: N/A;    Family History  Problem Relation Age of Onset   Hypertension Mother    Stroke Mother    Diabetes Daughter    Stroke Father    Healthy Sister    Hypertension Maternal Grandmother     Social History   Tobacco Use   Smoking status: Never   Smokeless tobacco: Never   Tobacco comments:    smoking cessation materials not required  Substance Use Topics   Alcohol  use: No    Alcohol /week: 0.0 standard drinks of alcohol      Current Outpatient Medications:    ACCU-CHEK GUIDE test strip, USE  AS  INSTRUCTED  DAILY, Disp: 100 strip, Rfl: 2   Accu-Chek Softclix Lancets lancets, USE AS DIRECTED EVERY DAY, Disp: 100 each, Rfl: 2   Alcohol  Swabs  (DROPSAFE ALCOHOL  PREP) 70 % PADS, USE AS DIRECTED THREE TIMES DAILY, Disp: 300 each, Rfl: 0   amiodarone (PACERONE) 200 MG tablet, Take 100 mg by mouth daily., Disp: , Rfl:    amLODipine -valsartan  (EXFORGE ) 5-160 MG tablet, Take 1 tablet by mouth daily.,  Disp: 90 tablet, Rfl: 0   apixaban (ELIQUIS) 2.5 MG TABS tablet, Take 2.5 mg by mouth daily., Disp: , Rfl:    atorvastatin  (LIPITOR) 80 MG tablet, Take 1 tablet (80 mg total) by mouth daily., Disp: 90 tablet, Rfl: 1   Blood Glucose Monitoring Suppl (ACCU-CHEK GUIDE ME) w/Device KIT, 1 each by Does not apply route daily., Disp: 1 kit, Rfl: 0   cholecalciferol (VITAMIN D ) 1000 UNITS tablet, Take 1,000 Units by mouth daily. , Disp: , Rfl:    folic acid  (FOLVITE ) 400 MCG tablet, Take 400 mcg by mouth daily., Disp: , Rfl:    Multiple Vitamins-Minerals (CENTRUM SILVER 50+WOMEN PO), Take 1 tablet by mouth daily., Disp: , Rfl:   Allergies  Allergen Reactions   Ace Inhibitors Other (See Comments)    Pt unable to recall. Was advised to avoid    I personally reviewed active problem list, medication list, allergies,  family history with the patient/caregiver today.   ROS  Ten systems reviewed and is negative except as mentioned in HPI    Objective Physical Exam Constitutional: Patient appears well-developed and well-nourished.  No distress.  HEENT: head atraumatic, normocephalic, pupils equal and reactive to light, neck supple Cardiovascular: Normal rate, regular rhythm and normal heart sounds.  No murmur heard. No BLE edema. Pulmonary/Chest: Effort normal and breath sounds normal. No respiratory distress. Abdominal: Soft.  There is no tenderness. Psychiatric: Patient has a normal mood and affect. behavior is normal. Judgment and thought content normal.   Vitals:   06/27/23 0931  BP: 138/74  Pulse: 85  Resp: 16  SpO2: 98%  Weight: 101 lb 4.8 oz (45.9 kg)  Height: 5\' 1"  (1.549 m)    Body mass index is 19.14 kg/m.  Recent Results (from the past 2160 hours)  POCT glycosylated hemoglobin (Hb A1C)     Status: Abnormal   Collection Time: 06/27/23  9:34 AM  Result Value Ref Range   Hemoglobin A1C 6.1 (A) 4.0 - 5.6 %   HbA1c POC (<> result, manual entry)     HbA1c, POC (prediabetic range)      HbA1c, POC (controlled diabetic range)      Diabetic Foot Exam:  Diabetic foot exam was performed with the following findings:   Normal sensation of 10g monofilament Intact posterior tibialis and dorsalis pedis pulses Thick toenails       PHQ2/9:    06/27/2023    9:21 AM 10/19/2022    3:07 PM 09/28/2022    7:48 AM 03/22/2022    8:43 AM 09/22/2021    1:00 PM  Depression screen PHQ 2/9  Decreased Interest 0 0 0 0 0  Down, Depressed, Hopeless 0 0 0 0 0  PHQ - 2 Score 0 0 0 0 0  Altered sleeping 0  0 0 0  Tired, decreased energy 0  0 0 0  Change in appetite 0  0 0 0  Feeling bad or failure about yourself  0  0 0 0  Trouble concentrating 0  0 0 0  Moving slowly or fidgety/restless 0  0 0 0  Suicidal thoughts 0  0 0 0  PHQ-9 Score 0  0 0 0  Difficult doing work/chores Not difficult at all  Not difficult at all      phq 9 is negative  Fall Risk:    06/27/2023    9:27 AM 02/27/2023    9:40 AM 10/19/2022    3:04 PM 09/28/2022    7:48 AM 03/22/2022    8:43 AM  Fall Risk   Falls in the past year? 0 0 0 0 0  Number falls in past yr: 0 0 0 0   Injury with Fall? 0 0 0 0   Risk for fall due to : No Fall Risks No Fall Risks No Fall Risks No Fall Risks No Fall Risks  Follow up Falls prevention discussed;Education provided;Falls evaluation completed Falls evaluation completed Education provided;Falls prevention discussed Falls prevention discussed;Education provided;Falls evaluation completed Falls prevention discussed;Education provided;Falls evaluation completed     Assessment & Plan  1. Controlled type 2 diabetes mellitus with stage 3 chronic kidney disease, without long-term current use of insulin (HCC) (Primary)  - POCT glycosylated hemoglobin (Hb A1C) - HM Diabetes Foot Exam - amLODipine -valsartan  (EXFORGE ) 5-160 MG tablet; Take 1 tablet by mouth daily.  Dispense: 90 tablet; Refill: 0  2. Sick sinus syndrome (HCC)  Pacemaker in place  3. Stage 3a chronic kidney disease  (HCC)  Recheck labs next visit   4. Paroxysmal A-fib (HCC)  Rate controlled and on Eliquis  5. Osteopenia of multiple sites  Recheck bone density in Nov  6. Iron  deficiency anemia due to chronic blood loss  Keep follow up with Dr. Wilhelmenia Harada  7. B12 deficiency  Taking once a week now   8. Vitamin D  deficiency  Continue supplementation  9. Dyslipidemia associated with type 2 diabetes mellitus (HCC)  - atorvastatin  (LIPITOR) 80 MG tablet; Take 1 tablet (80 mg total) by mouth daily.  Dispense: 90 tablet; Refill: 1

## 2023-07-09 ENCOUNTER — Encounter: Payer: Self-pay | Admitting: Oncology

## 2023-07-09 ENCOUNTER — Inpatient Hospital Stay (HOSPITAL_BASED_OUTPATIENT_CLINIC_OR_DEPARTMENT_OTHER): Payer: 59 | Admitting: Oncology

## 2023-07-09 ENCOUNTER — Inpatient Hospital Stay: Payer: 59 | Attending: Oncology

## 2023-07-09 VITALS — BP 161/66 | HR 80 | Temp 98.6°F | Resp 20 | Wt 102.4 lb

## 2023-07-09 DIAGNOSIS — I129 Hypertensive chronic kidney disease with stage 1 through stage 4 chronic kidney disease, or unspecified chronic kidney disease: Secondary | ICD-10-CM | POA: Diagnosis not present

## 2023-07-09 DIAGNOSIS — Z833 Family history of diabetes mellitus: Secondary | ICD-10-CM | POA: Diagnosis not present

## 2023-07-09 DIAGNOSIS — E538 Deficiency of other specified B group vitamins: Secondary | ICD-10-CM | POA: Insufficient documentation

## 2023-07-09 DIAGNOSIS — Z823 Family history of stroke: Secondary | ICD-10-CM | POA: Insufficient documentation

## 2023-07-09 DIAGNOSIS — K5730 Diverticulosis of large intestine without perforation or abscess without bleeding: Secondary | ICD-10-CM | POA: Insufficient documentation

## 2023-07-09 DIAGNOSIS — N189 Chronic kidney disease, unspecified: Secondary | ICD-10-CM

## 2023-07-09 DIAGNOSIS — D631 Anemia in chronic kidney disease: Secondary | ICD-10-CM | POA: Diagnosis present

## 2023-07-09 DIAGNOSIS — D509 Iron deficiency anemia, unspecified: Secondary | ICD-10-CM | POA: Insufficient documentation

## 2023-07-09 DIAGNOSIS — Z8249 Family history of ischemic heart disease and other diseases of the circulatory system: Secondary | ICD-10-CM | POA: Insufficient documentation

## 2023-07-09 DIAGNOSIS — Z7901 Long term (current) use of anticoagulants: Secondary | ICD-10-CM | POA: Diagnosis not present

## 2023-07-09 DIAGNOSIS — N183 Chronic kidney disease, stage 3 unspecified: Secondary | ICD-10-CM | POA: Insufficient documentation

## 2023-07-09 DIAGNOSIS — E1122 Type 2 diabetes mellitus with diabetic chronic kidney disease: Secondary | ICD-10-CM | POA: Diagnosis not present

## 2023-07-09 DIAGNOSIS — Z79899 Other long term (current) drug therapy: Secondary | ICD-10-CM | POA: Insufficient documentation

## 2023-07-09 LAB — CBC WITH DIFFERENTIAL (CANCER CENTER ONLY)
Abs Immature Granulocytes: 0.03 10*3/uL (ref 0.00–0.07)
Basophils Absolute: 0 10*3/uL (ref 0.0–0.1)
Basophils Relative: 1 %
Eosinophils Absolute: 0.1 10*3/uL (ref 0.0–0.5)
Eosinophils Relative: 3 %
HCT: 35.9 % — ABNORMAL LOW (ref 36.0–46.0)
Hemoglobin: 11.5 g/dL — ABNORMAL LOW (ref 12.0–15.0)
Immature Granulocytes: 1 %
Lymphocytes Relative: 37 %
Lymphs Abs: 1.3 10*3/uL (ref 0.7–4.0)
MCH: 29.7 pg (ref 26.0–34.0)
MCHC: 32 g/dL (ref 30.0–36.0)
MCV: 92.8 fL (ref 80.0–100.0)
Monocytes Absolute: 0.3 10*3/uL (ref 0.1–1.0)
Monocytes Relative: 8 %
Neutro Abs: 1.7 10*3/uL (ref 1.7–7.7)
Neutrophils Relative %: 50 %
Platelet Count: 196 10*3/uL (ref 150–400)
RBC: 3.87 MIL/uL (ref 3.87–5.11)
RDW: 13.2 % (ref 11.5–15.5)
WBC Count: 3.5 10*3/uL — ABNORMAL LOW (ref 4.0–10.5)
nRBC: 0 % (ref 0.0–0.2)

## 2023-07-09 LAB — CMP (CANCER CENTER ONLY)
ALT: 29 U/L (ref 0–44)
AST: 28 U/L (ref 15–41)
Albumin: 3.7 g/dL (ref 3.5–5.0)
Alkaline Phosphatase: 62 U/L (ref 38–126)
Anion gap: 8 (ref 5–15)
BUN: 17 mg/dL (ref 8–23)
CO2: 25 mmol/L (ref 22–32)
Calcium: 8.8 mg/dL — ABNORMAL LOW (ref 8.9–10.3)
Chloride: 106 mmol/L (ref 98–111)
Creatinine: 1.02 mg/dL — ABNORMAL HIGH (ref 0.44–1.00)
GFR, Estimated: 53 mL/min — ABNORMAL LOW (ref >60)
Glucose, Bld: 130 mg/dL — ABNORMAL HIGH (ref 70–99)
Potassium: 3.8 mmol/L (ref 3.5–5.1)
Sodium: 139 mmol/L (ref 135–145)
Total Bilirubin: 0.7 mg/dL (ref 0.0–1.2)
Total Protein: 6.9 g/dL (ref 6.5–8.1)

## 2023-07-09 LAB — RETIC PANEL
Immature Retic Fract: 4.2 % (ref 2.3–15.9)
RBC.: 3.87 MIL/uL (ref 3.87–5.11)
Retic Count, Absolute: 36 10*3/uL (ref 19.0–186.0)
Retic Ct Pct: 0.9 % (ref 0.4–3.1)
Reticulocyte Hemoglobin: 32.9 pg (ref >27.9)

## 2023-07-09 LAB — VITAMIN B12: Vitamin B-12: 524 pg/mL (ref 180–914)

## 2023-07-09 LAB — IRON AND TIBC
Iron: 71 ug/dL (ref 28–170)
Saturation Ratios: 26 % (ref 10.4–31.8)
TIBC: 274 ug/dL (ref 250–450)
UIBC: 203 ug/dL

## 2023-07-09 LAB — FERRITIN: Ferritin: 128 ng/mL (ref 11–307)

## 2023-07-09 NOTE — Assessment & Plan Note (Signed)
 B12 level is high, stop B12 supplementation 1-2 months, then resume Continue vitamin B12 once  per week.

## 2023-07-09 NOTE — Assessment & Plan Note (Addendum)
#   Iron  deficiency anemia, Labs reviewed and discussed with patient Lab Results  Component Value Date   HGB 11.5 (L) 07/09/2023   TIBC 274 07/09/2023   IRONPCTSAT 26 07/09/2023   FERRITIN 128 07/09/2023     No need for erythropoietin replacement therapy.   Iron  panel is stable. Continue oral iron  supplementation 2-3 times per week.

## 2023-07-09 NOTE — Progress Notes (Signed)
 Hematology/Oncology Progress note [phone] [phone]      Patient Care Team: Sowles, Krichna, MD as PCP - General (Family Medicine) Antonette Batters, MD as Consulting Physician (Cardiology) Timmy Forbes, MD as Consulting Physician (Oncology) Clair Crews, MD as Referring Physician (Ophthalmology)  ASSESSMENT & PLAN:   Anemia in chronic kidney disease (CKD) # Iron  deficiency anemia, Labs reviewed and discussed with patient Lab Results  Component Value Date   HGB 11.5 (L) 07/09/2023   TIBC 274 07/09/2023   IRONPCTSAT 26 07/09/2023   FERRITIN 128 07/09/2023     No need for erythropoietin replacement therapy.   Iron  panel is stable. Continue oral iron  supplementation 2-3 times per week.   B12 deficiency B12 level is high, stop B12 supplementation 1-2 months, then resume Continue vitamin B12 1000mcg once  per week.   Orders Placed This Encounter  Procedures   CMP (Cancer Center only)    Standing Status:   Future    Expected Date:   01/09/2024    Expiration Date:   07/08/2024   CBC with Differential (Cancer Center Only)    Standing Status:   Future    Expected Date:   01/09/2024    Expiration Date:   07/08/2024   Iron  and TIBC    Standing Status:   Future    Expected Date:   01/09/2024    Expiration Date:   07/08/2024   Ferritin    Standing Status:   Future    Expected Date:   01/09/2024    Expiration Date:   07/08/2024   Retic Panel    Standing Status:   Future    Expected Date:   01/09/2024    Expiration Date:   07/08/2024   Vitamin B12    Standing Status:   Future    Expected Date:   01/09/2024    Expiration Date:   07/08/2024   Follow up in 6 months.  All questions were answered. The patient knows to call the clinic with any problems, questions or concerns.  Timmy Forbes, MD, PhD [facility] Hematology Oncology 07/09/2023   REASON FOR VISIT Follow up for treatment of anemia  HISTORY OF PRESENTING ILLNESS:  Emma Campbell is a  89  y.o.  female with PMH listed below who was referred to me for evaluation of anemia.  Patient follows up with primary care physician and has had labs done recently. 04/17/17 CBC showed hemoglobin 9, MCV 81.9, platelet 245,000, WBC 4.1, normal differential.  Iron  panel showed TIBC 442, ferritin 11, saturation 15%. Previous GI workup:  # 01/23/2017 EGD showed non bleeding angiotecsia, treated with APC. 12/22/2016 Capsule study showed duodenum AVM.  11/06/2016 colonoscopy showed non bleeding hemorroids, sigmoid polyp, and diverticulosis.   INTERVAL HISTORY Emma Campbell is a 89 y.o. female who has above history reviewed by me today presents for follow up for management of iron  deficiency anemia.  Patient takes oral iron  supplementation 2-3 times per weeks, and B12 supplementation once per week. Sh takes Eliquis 2.5mg  BID for A fib. No bleeding events.  No new complaints.she feels well.   Review of Systems  Constitutional:  Negative for chills, fever, malaise/fatigue and weight loss.  HENT:  Negative for sore throat.   Eyes:  Negative for redness.  Respiratory:  Negative for cough, shortness of breath and wheezing.   Cardiovascular:  Negative for chest pain, palpitations and leg swelling.  Gastrointestinal:  Negative for abdominal pain, blood in stool, nausea and vomiting.  Genitourinary:  Negative for dysuria.  Musculoskeletal:  Negative for myalgias.  Skin:  Negative for rash.  Neurological:  Negative for dizziness, tingling and tremors.  Endo/Heme/Allergies:  Does not bruise/bleed easily.  Psychiatric/Behavioral:  Negative for hallucinations.     MEDICAL HISTORY:  Past Medical History:  Diagnosis Date   Anemia    Chronic kidney disease, stage III (moderate) (HCC)    Diverticulosis    Elevated LFTs    Essential hypertension, malignant    Fibrocystic breast disease    Heart murmur    Hyperlipidemia    Menopausal problem    Osteoarthrosis    Osteoporosis    Peripheral autonomic  neuropathy    Presence of permanent cardiac pacemaker    Sick sinus syndrome (HCC)    Dr. Allana Ishikawa   Type II diabetes mellitus with renal manifestations (HCC)    Vitamin D  deficiency     SURGICAL HISTORY: Past Surgical History:  Procedure Laterality Date   CATARACT EXTRACTION W/PHACO Right 01/07/2019   Procedure: CATARACT EXTRACTION PHACO AND INTRAOCULAR LENS PLACEMENT (IOC) RIGHT DIABETIC 02:15.1          25.7%        34.69;  Surgeon: Clair Crews, MD;  Location: [facility];  Service: Ophthalmology;  Laterality: Right;  Diabetic - oral meds   CATARACT EXTRACTION W/PHACO Left 01/28/2019   Procedure: CATARACT EXTRACTION PHACO AND INTRAOCULAR LENS PLACEMENT (IOC) LEFT DIABETIC 12.96  01:15.5;  Surgeon: Clair Crews, MD;  Location: [facility];  Service: Ophthalmology;  Laterality: Left;  Diabetic - oral meds   COLONOSCOPY WITH PROPOFOL  N/A 11/06/2016   Procedure: COLONOSCOPY WITH PROPOFOL ;  Surgeon: Marnee Sink, MD;  Location: [facility];  Service: Endoscopy;  Laterality: N/A;  DIABETIC   ESOPHAGOGASTRODUODENOSCOPY (EGD) WITH PROPOFOL  N/A 11/06/2016   Procedure: ESOPHAGOGASTRODUODENOSCOPY (EGD) WITH PROPOFOL ;  Surgeon: Marnee Sink, MD;  Location: [facility];  Service: Endoscopy;  Laterality: N/A;   ESOPHAGOGASTRODUODENOSCOPY (EGD) WITH PROPOFOL  N/A 01/23/2017   Procedure: ESOPHAGOGASTRODUODENOSCOPY (EGD) WITH PROPOFOL  with PUSH;  Surgeon: Marnee Sink, MD;  Location: [facility] ENDOSCOPY;  Service: Endoscopy;  Laterality: N/A;   GIVENS CAPSULE STUDY N/A 12/22/2016   Procedure: GIVENS CAPSULE STUDY;  Surgeon: Marnee Sink, MD;  Location: [facility] END[facility];  Service: Endoscopy;  Laterality: N/A;   INSERT / REPLACE / REMOVE PACEMAKER     PACEMAKER INSERTION  2006   POLYPECTOMY  11/06/2016   Procedure: POLYPECTOMY;  Surgeon: Marnee Sink, MD;  Location: [facility];  Service: Endoscopy;;   PPM GENERATOR CHANGEOUT N/A 03/30/2022   Procedure: PPM  GENERATOR CHANGEOUT;  Surgeon: Percival Brace, MD;  Location: [facility] INVASIVE CV LAB;  Service: Cardiovascular;  Laterality: N/A;    SOCIAL HISTORY: Social History   Socioeconomic History   Marital status: Widowed    Spouse name: Myrtie Atkinson   Number of children: 1   Years of education: 2 years of college   Highest education level: 12th grade  Occupational History   Occupation: Retired Human resources officer at [employer]  Tobacco Use   Smoking status: Never   Smokeless tobacco: Never   Tobacco comments:    smoking cessation materials not required  Vaping Use   Vaping status: Never Used  Substance and Sexual Activity   Alcohol  use: No    Alcohol /week: 0.0 standard drinks of alcohol    Drug use: No   Sexual activity: Never  Other Topics Concern   Not on file  Social History Narrative   She still has a house but her daughter and  grand-daughter are staying at her house   She moved in with her older sister ( 8 years older), to help her out.    Social Drivers of Corporate investment banker Strain: Low Risk  (10/19/2022)   Overall Financial Resource Strain (CARDIA)    Difficulty of Paying Living Expenses: Not hard at all  Food Insecurity: No Food Insecurity (10/19/2022)   Hunger Vital Sign    Worried About Running Out of Food in the Last Year: Never true    Ran Out of Food in the Last Year: Never true  Transportation Needs: No Transportation Needs (10/19/2022)   PRAPARE - Administrator, Civil Service (Medical): No    Lack of Transportation (Non-Medical): No  Physical Activity: Inactive (10/19/2022)   Exercise Vital Sign    Days of Exercise per Week: 0 days    Minutes of Exercise per Session: 0 min  Stress: No Stress Concern Present (10/19/2022)   [employer] of Occupational Health - Occupational Stress Questionnaire    Feeling of Stress : Not at all  Social Connections: Moderately Isolated (10/19/2022)   Social Connection and Isolation Panel [NHANES]    Frequency of  Communication with Friends and Family: More than three times a week    Frequency of Social Gatherings with Friends and Family: More than three times a week    Attends Religious Services: More than 4 times per year    Active Member of [employer] or Organizations: No    Attends Banker Meetings: Never    Marital Status: Widowed  Intimate Partner Violence: Not At Risk (10/19/2022)   Humiliation, Afraid, Rape, and Kick questionnaire    Fear of Current or Ex-Partner: No    Emotionally Abused: No    Physically Abused: No    Sexually Abused: No    FAMILY HISTORY: Family History  Problem Relation Age of Onset   Hypertension Mother    Stroke Mother    Diabetes Daughter    Stroke Father    Healthy Sister    Hypertension Maternal Grandmother     ALLERGIES:  is allergic to ace inhibitors.  MEDICATIONS:  Current Outpatient Medications  Medication Sig Dispense Refill   ACCU-CHEK GUIDE test strip USE  AS  INSTRUCTED  DAILY 100 strip 2   Accu-Chek Softclix Lancets lancets USE AS DIRECTED EVERY DAY 100 each 2   Alcohol  Swabs  (DROPSAFE ALCOHOL  PREP) 70 % PADS USE AS DIRECTED THREE TIMES DAILY 300 each 0   amiodarone (PACERONE) 200 MG tablet Take 100 mg by mouth daily.     amLODipine -valsartan  (EXFORGE ) 5-160 MG tablet Take 1 tablet by mouth daily. 90 tablet 0   apixaban (ELIQUIS) 2.5 MG TABS tablet Take 2.5 mg by mouth daily.     atorvastatin  (LIPITOR) 80 MG tablet Take 1 tablet (80 mg total) by mouth daily. 90 tablet 1   Blood Glucose Monitoring Suppl (ACCU-CHEK GUIDE ME) w/Device KIT 1 each by Does not apply route daily. 1 kit 0   cholecalciferol (VITAMIN D ) 1000 UNITS tablet Take 1,000 Units by mouth daily.      folic acid  (FOLVITE ) 400 MCG tablet Take 400 mcg by mouth daily.     Multiple Vitamins-Minerals (CENTRUM SILVER 50+WOMEN PO) Take 1 tablet by mouth daily.     No current facility-administered medications for this visit.     PHYSICAL EXAMINATION: ECOG PERFORMANCE STATUS:  1 - Symptomatic but completely ambulatory Vitals:   07/09/23 1103  BP: (!) 161/66  Pulse: 80  Resp: 20  Temp: 98.6 F (37 C)  SpO2: 100%   Filed Weights   07/09/23 1103  Weight: 102 lb 6.4 oz (46.4 kg)    Physical Exam Constitutional:      General: She is not in acute distress.    Appearance: She is not diaphoretic.     Comments: Thin built   HENT:     Head: Normocephalic and atraumatic.  Eyes:     General: No scleral icterus. Neck:     Vascular: No JVD.  Cardiovascular:     Rate and Rhythm: Normal rate.     Heart sounds: Murmur heard.  Pulmonary:     Effort: Pulmonary effort is normal. No respiratory distress.  Abdominal:     General: There is no distension.  Musculoskeletal:        General: Normal range of motion.     Cervical back: Normal range of motion and neck supple.  Lymphadenopathy:     Cervical: No cervical adenopathy.  Skin:    General: Skin is warm and dry.     Findings: No erythema or rash.  Neurological:     Mental Status: She is alert and oriented to person, place, and time. Mental status is at baseline.     Cranial Nerves: No cranial nerve deficit.     Motor: No abnormal muscle tone.  Psychiatric:        Mood and Affect: Affect normal.        Judgment: Judgment normal.      LABORATORY DATA:  I have reviewed the data as listed    Latest Ref Rng & Units 07/09/2023   10:33 AM 01/05/2023    9:58 AM 06/27/2022   11:09 AM  CBC  WBC 4.0 - 10.5 K/uL 3.5  3.6  3.6   Hemoglobin 12.0 - 15.0 g/dL 86.5  78.4  69.6   Hematocrit 36.0 - 46.0 % 35.9  33.9  37.9   Platelets 150 - 400 K/uL 196  195  174       Latest Ref Rng & Units 07/09/2023   10:33 AM 01/05/2023    9:58 AM 09/28/2022    8:33 AM  CMP  Glucose 70 - 99 mg/dL 295  284  132   BUN 8 - 23 mg/dL 17  15  16    Creatinine 0.44 - 1.00 mg/dL 4.40  1.02  7.25   Sodium 135 - 145 mmol/L 139  135  140   Potassium 3.5 - 5.1 mmol/L 3.8  3.5  3.9   Chloride 98 - 111 mmol/L 106  106  105   CO2 22 - 32  mmol/L 25  23  23    Calcium  8.9 - 10.3 mg/dL 8.8  8.5  9.3   Total Protein 6.5 - 8.1 g/dL 6.9  6.6  6.7   Total Bilirubin 0.0 - 1.2 mg/dL 0.7  0.9  0.6   Alkaline Phos 38 - 126 U/L 62  49    AST 15 - 41 U/L 28  30  25    ALT 0 - 44 U/L 29  27  20     Lab Results  Component Value Date   IRON  71 07/09/2023   TIBC 274 07/09/2023   FERRITIN 128 07/09/2023     SPEP not observed M spike.

## 2023-07-31 ENCOUNTER — Other Ambulatory Visit: Payer: Self-pay | Admitting: Family Medicine

## 2023-07-31 ENCOUNTER — Telehealth: Payer: Self-pay | Admitting: Family Medicine

## 2023-07-31 DIAGNOSIS — N183 Chronic kidney disease, stage 3 unspecified: Secondary | ICD-10-CM

## 2023-07-31 MED ORDER — AMLODIPINE BESYLATE-VALSARTAN 5-160 MG PO TABS: 1.0000 | ORAL_TABLET | Freq: Every day | ORAL | 0 refills | Status: DC

## 2023-07-31 NOTE — Telephone Encounter (Signed)
 Copied from CRM [id number]. Topic: Clinical - Medication Refill >> Jul 31, 2023 11:00 AM Tiffany S wrote: Medication: amLODipine -valsartan  (EXFORGE ) 5-160 MG tablet [[id number]]  Has the patient contacted their pharmacy? Yes (Agent: If no, request that the patient contact the pharmacy for the refill. If patient does not wish to contact the pharmacy document the reason why and proceed with request.) (Agent: If yes, when and what did the pharmacy advise?)  This is the patient's preferred pharmacy:  [facility] [address]), [place] - [address] [place]) [place] [address] Phone: [phone] Fax: [phone]    Is this the correct pharmacy for this prescription? Yes If no, delete pharmacy and type the correct one.   Has the prescription been filled recently? Yes  Is the patient out of the medication? Yes  Has the patient been seen for an appointment in the last year OR does the patient have an upcoming appointment? Yes  Can we respond through MyChart? Yes  Agent: Please be advised that Rx refills may take up to 3 business days. We ask that you follow-up with your pharmacy.

## 2023-07-31 NOTE — Telephone Encounter (Unsigned)
 Copied from CRM [id number]. Topic: Clinical - Medication Refill >> Jul 31, 2023 11:00 AM Tiffany S wrote: Medication: amLODipine -valsartan  (EXFORGE ) 5-160 MG tablet [[id number]]  Has the patient contacted their pharmacy? Yes (Agent: If no, request that the patient contact the pharmacy for the refill. If patient does not wish to contact the pharmacy document the reason why and proceed with request.) (Agent: If yes, when and what did the pharmacy advise?)  This is the patient's preferred pharmacy:  [facility] [address]), [place] - [address] [place]) [place] [address] Phone: [phone] Fax: [phone]    Is this the correct pharmacy for this prescription? Yes If no, delete pharmacy and type the correct one.   Has the prescription been filled recently? Yes  Is the patient out of the medication? Yes  Has the patient been seen for an appointment in the last year OR does the patient have an upcoming appointment? Yes  Can we respond through MyChart? Yes  Agent: Please be advised that Rx refills may take up to 3 business days. We ask that you follow-up with your pharmacy.

## 2023-08-22 ENCOUNTER — Telehealth: Payer: Self-pay | Admitting: Family Medicine

## 2023-08-22 NOTE — Telephone Encounter (Signed)
Appt sch'd for tomorrow   

## 2023-08-22 NOTE — Telephone Encounter (Signed)
 Tried to reach out to patient. We are currently booked and is short staffed. Dr Glenard is out of the office and the other providers will not prescribe medication without being seen. I will keep her on a wait list for any cancellations or she is more than welcome to go to urgent care.

## 2023-08-22 NOTE — Telephone Encounter (Signed)
 Copied from CRM [id number]. Topic: Appointments - Scheduling Inquiry for Clinic >> Aug 22, 2023  8:35 AM Willma R wrote: Reason for CRM: Patient states was sick two weeks ago and has had a lingering dry cough. Offered to schedule her Friday with Gareth or Leavy and patient doesn't want to wait that long. States last time she had a cough Dr. Glenard just sent a prescription in. Would like to talk to someone in the office directly to see if that can be done or if she can get in sooner.  Patient can be reached at [phone]

## 2023-08-22 NOTE — Progress Notes (Signed)
 There were no vitals taken for this visit.   Subjective:    Patient ID: Emma Campbell, female    DOB: May 10, 1932, 89 y.o.   MRN: [id number]  HPI: Emma Campbell is a 89 y.o. female  No chief complaint on file.   Discussed the use of AI scribe software for clinical note transcription with the patient, who gave verbal consent to proceed.  History of Present Illness          06/27/2023    9:21 AM 10/19/2022    3:07 PM 09/28/2022    7:48 AM  Depression screen PHQ 2/9  Decreased Interest 0 0 0  Down, Depressed, Hopeless 0 0 0  PHQ - 2 Score 0 0 0  Altered sleeping 0  0  Tired, decreased energy 0  0  Change in appetite 0  0  Feeling bad or failure about yourself  0  0  Trouble concentrating 0  0  Moving slowly or fidgety/restless 0  0  Suicidal thoughts 0  0  PHQ-9 Score 0  0  Difficult doing work/chores Not difficult at all  Not difficult at all    Relevant past medical, surgical, family and social history reviewed and updated as indicated. Interim medical history since our last visit reviewed. Allergies and medications reviewed and updated.  Review of Systems  Per HPI unless specifically indicated above     Objective:     There were no vitals taken for this visit.  {Vitals History (Optional):23777} Wt Readings from Last 3 Encounters:  07/09/23 102 lb 6.4 oz (46.4 kg)  06/27/23 101 lb 4.8 oz (45.9 kg)  02/27/23 105 lb 9.6 oz (47.9 kg)    Physical Exam Physical Exam    Results for orders placed or performed in visit on 07/09/23  Vitamin B12   Collection Time: 07/09/23 10:33 AM  Result Value Ref Range   Vitamin B-12 524 180 - 914 pg/mL  Retic Panel   Collection Time: 07/09/23 10:33 AM  Result Value Ref Range   Retic Ct Pct 0.9 0.4 - 3.1 %   RBC. 3.87 3.87 - 5.11 MIL/uL   Retic Count, Absolute 36.0 19.0 - 186.0 K/uL   Immature Retic Fract 4.2 2.3 - 15.9 %   Reticulocyte Hemoglobin 32.9 >27.9 pg  Ferritin   Collection Time: 07/09/23 10:33 AM  Result  Value Ref Range   Ferritin 128 11 - 307 ng/mL  Iron  and TIBC   Collection Time: 07/09/23 10:33 AM  Result Value Ref Range   Iron  71 28 - 170 ug/dL   TIBC 725 749 - 549 ug/dL   Saturation Ratios 26 10.4 - 31.8 %   UIBC 203 ug/dL  CBC with Differential (Cancer Center Only)   Collection Time: 07/09/23 10:33 AM  Result Value Ref Range   WBC Count 3.5 (L) 4.0 - 10.5 K/uL   RBC 3.87 3.87 - 5.11 MIL/uL   Hemoglobin 11.5 (L) 12.0 - 15.0 g/dL   HCT 64.0 (L) 63.9 - 53.9 %   MCV 92.8 80.0 - 100.0 fL   MCH 29.7 26.0 - 34.0 pg   MCHC 32.0 30.0 - 36.0 g/dL   RDW 86.7 88.4 - 84.4 %   Platelet Count 196 150 - 400 K/uL   nRBC 0.0 0.0 - 0.2 %   Neutrophils Relative % 50 %   Neutro Abs 1.7 1.7 - 7.7 K/uL   Lymphocytes Relative 37 %   Lymphs Abs 1.3 0.7 - 4.0 K/uL  Monocytes Relative 8 %   Monocytes Absolute 0.3 0.1 - 1.0 K/uL   Eosinophils Relative 3 %   Eosinophils Absolute 0.1 0.0 - 0.5 K/uL   Basophils Relative 1 %   Basophils Absolute 0.0 0.0 - 0.1 K/uL   Immature Granulocytes 1 %   Abs Immature Granulocytes 0.03 0.00 - 0.07 K/uL  CMP (Cancer Center only)   Collection Time: 07/09/23 10:33 AM  Result Value Ref Range   Sodium 139 135 - 145 mmol/L   Potassium 3.8 3.5 - 5.1 mmol/L   Chloride 106 98 - 111 mmol/L   CO2 25 22 - 32 mmol/L   Glucose, Bld 130 (H) 70 - 99 mg/dL   BUN 17 8 - 23 mg/dL   Creatinine 8.97 (H) 9.55 - 1.00 mg/dL   Calcium  8.8 (L) 8.9 - 10.3 mg/dL   Total Protein 6.9 6.5 - 8.1 g/dL   Albumin 3.7 3.5 - 5.0 g/dL   AST 28 15 - 41 U/L   ALT 29 0 - 44 U/L   Alkaline Phosphatase 62 38 - 126 U/L   Total Bilirubin 0.7 0.0 - 1.2 mg/dL   GFR, Estimated 53 (L) >60 mL/min   Anion gap 8 5 - 15   {Labs (Optional):23779}       Assessment & Plan:   Problem List Items Addressed This Visit   None    Assessment and Plan Assessment & Plan         Follow up plan: No follow-ups on file.

## 2023-08-23 ENCOUNTER — Ambulatory Visit (INDEPENDENT_AMBULATORY_CARE_PROVIDER_SITE_OTHER): Admitting: Nurse Practitioner

## 2023-08-23 ENCOUNTER — Encounter: Payer: Self-pay | Admitting: Nurse Practitioner

## 2023-08-23 VITALS — BP 120/58 | HR 86 | Temp 98.2°F | Resp 18 | Ht 61.0 in | Wt 100.3 lb

## 2023-08-23 DIAGNOSIS — R051 Acute cough: Secondary | ICD-10-CM

## 2023-08-23 MED ORDER — BENZONATATE 100 MG PO CAPS: 200.0000 mg | ORAL_CAPSULE | Freq: Two times a day (BID) | ORAL | 0 refills | Status: DC | PRN

## 2023-08-24 ENCOUNTER — Encounter: Admitting: Family Medicine

## 2023-08-27 ENCOUNTER — Other Ambulatory Visit: Payer: Self-pay | Admitting: Family Medicine

## 2023-08-27 DIAGNOSIS — E1169 Type 2 diabetes mellitus with other specified complication: Secondary | ICD-10-CM

## 2023-10-01 ENCOUNTER — Other Ambulatory Visit: Payer: Self-pay | Admitting: Family Medicine

## 2023-10-01 DIAGNOSIS — E1169 Type 2 diabetes mellitus with other specified complication: Secondary | ICD-10-CM

## 2023-10-18 LAB — OPHTHALMOLOGY REPORT-SCANNED

## 2023-10-25 ENCOUNTER — Ambulatory Visit: Payer: 59

## 2023-10-25 DIAGNOSIS — Z0000 Encounter for general adult medical examination without abnormal findings: Secondary | ICD-10-CM

## 2023-10-25 NOTE — Progress Notes (Signed)
 Subjective:   Emma Campbell is a 89 y.o. who presents for a Medicare Wellness preventive visit.  As a reminder, Annual Wellness Visits don't include a physical exam, and some assessments may be limited, especially if this visit is performed virtually. We may recommend an in-person follow-up visit with your provider if needed.  Visit Complete: Virtual I connected with  Emma Campbell on 10/25/23 by a audio enabled telemedicine application and verified that I am speaking with the correct person using two identifiers.  Patient Location: Home  Provider Location: Home Office  I discussed the limitations of evaluation and management by telemedicine. The patient expressed understanding and agreed to proceed.  Vital Signs: Because this visit was a virtual/telehealth visit, some criteria may be missing or patient reported. Any vitals not documented were not able to be obtained and vitals that have been documented are patient reported.  VideoDeclined- This patient declined Librarian, academic. Therefore the visit was completed with audio only.  Persons Participating in Visit: Patient.  AWV Questionnaire: No: Patient Medicare AWV questionnaire was not completed prior to this visit.  Cardiac Risk Factors include: advanced age (>62men, >42 women);diabetes mellitus;dyslipidemia;hypertension     Objective:    There were no vitals filed for this visit. There is no height or weight on file to calculate BMI.     10/25/2023    2:47 PM 07/09/2023   11:03 AM 01/08/2023   10:33 AM 10/19/2022    3:10 PM 07/04/2022    1:12 PM 03/30/2022    7:45 AM 12/09/2020   10:07 AM  Advanced Directives  Does Patient Have a Medical Advance Directive? No No Yes Yes Yes No Yes  Type of Advance Directive   Living will;Healthcare Power of Attorney Living will;Healthcare Power of Attorney Living will;Healthcare Power of [employer] Power of [place];Living will  Does patient want to  make changes to medical advance directive?       No - Patient declined  Copy of Healthcare Power of Attorney in Chart?   No - copy requested  No - copy requested    Would patient like information on creating a medical advance directive? No - Patient declined No - Patient declined    No - Patient declined     Current Medications (verified) Outpatient Encounter Medications as of 10/25/2023  Medication Sig   ACCU-CHEK GUIDE test strip USE  AS  INSTRUCTED  DAILY   Accu-Chek Softclix Lancets lancets USE AS DIRECTED EVERY DAY   Alcohol  Swabs  (DROPSAFE ALCOHOL  PREP) 70 % PADS USE AS DIRECTED THREE TIMES DAILY   amiodarone (PACERONE) 200 MG tablet Take 100 mg by mouth daily.   amLODipine -valsartan  (EXFORGE ) 5-160 MG tablet Take 1 tablet by mouth daily.   apixaban (ELIQUIS) 2.5 MG TABS tablet Take 2.5 mg by mouth daily.   atorvastatin  (LIPITOR) 80 MG tablet Take 1 tablet (80 mg total) by mouth daily.   Blood Glucose Monitoring Suppl (ACCU-CHEK GUIDE ME) w/Device KIT 1 each by Does not apply route daily.   cholecalciferol (VITAMIN D ) 1000 UNITS tablet Take 1,000 Units by mouth daily.    folic acid  (FOLVITE ) 400 MCG tablet Take 400 mcg by mouth daily.   Multiple Vitamins-Minerals (CENTRUM SILVER 50+WOMEN PO) Take 1 tablet by mouth daily.   benzonatate  (TESSALON ) 100 MG capsule Take 2 capsules (200 mg total) by mouth 2 (two) times daily as needed for cough. (Patient not taking: Reported on 10/25/2023)   No facility-administered encounter medications on file  as of 10/25/2023.    Allergies (verified) Ace inhibitors   History: Past Medical History:  Diagnosis Date   Anemia    Chronic kidney disease, stage III (moderate) (HCC)    Diverticulosis    Elevated LFTs    Essential hypertension, malignant    Fibrocystic breast disease    Heart murmur    Hyperlipidemia    Menopausal problem    Osteoarthrosis    Osteoporosis    Peripheral autonomic neuropathy    Presence of permanent cardiac pacemaker     Sick sinus syndrome (HCC)    Dr. Dorthula   Type II diabetes mellitus with renal manifestations (HCC)    Vitamin D  deficiency    Past Surgical History:  Procedure Laterality Date   CATARACT EXTRACTION W/PHACO Right 01/07/2019   Procedure: CATARACT EXTRACTION PHACO AND INTRAOCULAR LENS PLACEMENT (IOC) RIGHT DIABETIC 02:15.1          25.7%        34.69;  Surgeon: Jaye Fallow, MD;  Location: [facility];  Service: Ophthalmology;  Laterality: Right;  Diabetic - oral meds   CATARACT EXTRACTION W/PHACO Left 01/28/2019   Procedure: CATARACT EXTRACTION PHACO AND INTRAOCULAR LENS PLACEMENT (IOC) LEFT DIABETIC 12.96  01:15.5;  Surgeon: Jaye Fallow, MD;  Location: [facility];  Service: Ophthalmology;  Laterality: Left;  Diabetic - oral meds   COLONOSCOPY WITH PROPOFOL  N/A 11/06/2016   Procedure: COLONOSCOPY WITH PROPOFOL ;  Surgeon: Jinny Carmine, MD;  Location: [facility];  Service: Endoscopy;  Laterality: N/A;  DIABETIC   ESOPHAGOGASTRODUODENOSCOPY (EGD) WITH PROPOFOL  N/A 11/06/2016   Procedure: ESOPHAGOGASTRODUODENOSCOPY (EGD) WITH PROPOFOL ;  Surgeon: Jinny Carmine, MD;  Location: [facility];  Service: Endoscopy;  Laterality: N/A;   ESOPHAGOGASTRODUODENOSCOPY (EGD) WITH PROPOFOL  N/A 01/23/2017   Procedure: ESOPHAGOGASTRODUODENOSCOPY (EGD) WITH PROPOFOL  with PUSH;  Surgeon: Jinny Carmine, MD;  Location: [facility] ENDOSCOPY;  Service: Endoscopy;  Laterality: N/A;   GIVENS CAPSULE STUDY N/A 12/22/2016   Procedure: GIVENS CAPSULE STUDY;  Surgeon: Jinny Carmine, MD;  Location: [facility];  Service: Endoscopy;  Laterality: N/A;   INSERT / REPLACE / REMOVE PACEMAKER     PACEMAKER INSERTION  2006   POLYPECTOMY  11/06/2016   Procedure: POLYPECTOMY;  Surgeon: Jinny Carmine, MD;  Location: [facility];  Service: Endoscopy;;   PPM GENERATOR CHANGEOUT N/A 03/30/2022   Procedure: PPM GENERATOR CHANGEOUT;  Surgeon: Ammon Blunt, MD;  Location: [facility]  INVASIVE CV LAB;  Service: Cardiovascular;  Laterality: N/A;   Family History  Problem Relation Age of Onset   Hypertension Mother    Stroke Mother    Diabetes Daughter    Stroke Father    Healthy Sister    Hypertension Maternal Grandmother    Social History   Socioeconomic History   Marital status: Widowed    Spouse name: Alm   Number of children: 1   Years of education: 2 years of college   Highest education level: 12th grade  Occupational History   Occupation: Retired Human resources officer at [employer]  Tobacco Use   Smoking status: Never   Smokeless tobacco: Never   Tobacco comments:    smoking cessation materials not required  Vaping Use   Vaping status: Never Used  Substance and Sexual Activity   Alcohol  use: No    Alcohol /week: 0.0 standard drinks of alcohol    Drug use: No   Sexual activity: Never  Other Topics Concern   Not on file  Social History Narrative   She still has a house  but her daughter and grand-daughter are staying at her house   She moved in with her older sister ( 8 years older), to help her out.    Social Drivers of Corporate investment banker Strain: Low Risk  (10/25/2023)   Overall Financial Resource Strain (CARDIA)    Difficulty of Paying Living Expenses: Not hard at all  Food Insecurity: No Food Insecurity (10/25/2023)   Hunger Vital Sign    Worried About Running Out of Food in the Last Year: Never true    Ran Out of Food in the Last Year: Never true  Transportation Needs: No Transportation Needs (10/25/2023)   PRAPARE - Administrator, Civil Service (Medical): No    Lack of Transportation (Non-Medical): No  Physical Activity: Insufficiently Active (10/25/2023)   Exercise Vital Sign    Days of Exercise per Week: 3 days    Minutes of Exercise per Session: 30 min  Stress: No Stress Concern Present (10/25/2023)   [employer] of Occupational Health - Occupational Stress Questionnaire    Feeling of Stress: Not at all  Social  Connections: Moderately Isolated (10/25/2023)   Social Connection and Isolation Panel    Frequency of Communication with Friends and Family: More than three times a week    Frequency of Social Gatherings with Friends and Family: Once a week    Attends Religious Services: More than 4 times per year    Active Member of [employer] or Organizations: No    Attends Banker Meetings: Never    Marital Status: Widowed    Tobacco Counseling Counseling given: Not Answered Tobacco comments: smoking cessation materials not required    Clinical Intake:  Pre-visit preparation completed: Yes  Pain : No/denies pain     BMI - recorded: 18.9 Nutritional Status: BMI <19  Underweight Nutritional Risks: None Diabetes: Yes CBG done?: No Did pt. bring in CBG monitor from home?: No  Lab Results  Component Value Date   HGBA1C 6.1 (A) 06/27/2023   HGBA1C 6.7 (A) 02/27/2023   HGBA1C 6.5 (A) 09/28/2022     How often do you need to have someone help you when you read instructions, pamphlets, or other written materials from your doctor or pharmacy?: 1 - Never  Interpreter Needed?: No  Information entered by :: JHONNIE DAS, LPN   Activities of Daily Living    10/25/2023    2:49 PM  In your present state of health, do you have any difficulty performing the following activities:  Hearing? 0  Vision? 0  Difficulty concentrating or making decisions? 0  Walking or climbing stairs? 0  Dressing or bathing? 0  Doing errands, shopping? 0  Preparing Food and eating ? N  Using the Toilet? N  In the past six months, have you accidently leaked urine? Y  Do you have problems with loss of bowel control? N  Managing your Medications? N  Managing your Finances? N  Housekeeping or managing your Housekeeping? N    Patient Care Team: Sowles, Krichna, MD as PCP - General (Family Medicine) Florencio Cara BIRCH, MD as Consulting Physician (Cardiology) Babara Call, MD as Consulting Physician  (Oncology) Jaye Fallow, MD as Referring Physician (Ophthalmology)  I have updated your Care Teams any recent Medical Services you may have received from other providers in the past year.     Assessment:   This is a routine wellness examination for Emma Campbell.  Hearing/Vision screen Hearing Screening - Comments:: NO AIDS Vision Screening - Comments:: WEARS  GLASSES ALL DAY- DR.PORFILIO- JUST HAD VISIT   Goals Addressed             This Visit's Progress    DIET - EAT MORE FRUITS AND VEGETABLES         Depression Screen     10/25/2023    2:46 PM 06/27/2023    9:21 AM 10/19/2022    3:07 PM 09/28/2022    7:48 AM 03/22/2022    8:43 AM 09/22/2021    1:00 PM 09/19/2021    9:38 AM  PHQ 2/9 Scores  PHQ - 2 Score 0 0 0 0 0 0 0  PHQ- 9 Score 0 0  0 0 0 0    Fall Risk     10/25/2023    2:48 PM 08/23/2023    9:18 AM 06/27/2023    9:27 AM 02/27/2023    9:40 AM 10/19/2022    3:04 PM  Fall Risk   Falls in the past year? 0 0 0 0 0  Number falls in past yr: 0 0 0 0 0  Injury with Fall? 0 0 0 0 0  Risk for fall due to : No Fall Risks  No Fall Risks No Fall Risks No Fall Risks  Follow up Falls evaluation completed;Falls prevention discussed Falls evaluation completed Falls prevention discussed;Education provided;Falls evaluation completed Falls evaluation completed Education provided;Falls prevention discussed    MEDICARE RISK AT HOME:  Medicare Risk at Home Any stairs in or around the home?: No If so, are there any without handrails?: No Home free of loose throw rugs in walkways, pet beds, electrical cords, etc?: Yes Adequate lighting in your home to reduce risk of falls?: Yes Life alert?: No Use of a cane, walker or w/c?: No Grab bars in the bathroom?: No Shower chair or bench in shower?: No Elevated toilet seat or a handicapped toilet?: No  TIMED UP AND GO:  Was the test performed?  No  Cognitive Function: 6CIT completed        10/25/2023    2:51 PM 10/19/2022    3:14 PM  09/22/2021    1:03 PM 09/11/2019   11:37 AM 09/10/2018   11:38 AM  6CIT Screen  What Year? 0 points 0 points 0 points 0 points 0 points  What month? 0 points 0 points 0 points 0 points 0 points  What time? 0 points 0 points 0 points 0 points 0 points  Count back from 20 0 points 0 points 0 points 0 points 0 points  Months in reverse 0 points 0 points 0 points 0 points 2 points  Repeat phrase 0 points 0 points 0 points 2 points 0 points  Total Score 0 points 0 points 0 points 2 points 2 points    Immunizations Immunization History  Administered Date(s) Administered   Fluad Quad(high Dose 65+) 11/19/2018, 12/15/2019, 11/28/2021   Fluad Trivalent(High Dose 65+) 12/08/2022   INFLUENZA, HIGH DOSE SEASONAL PF 11/30/2014, 12/09/2015, 12/11/2016, 12/17/2017, 11/18/2020   Influenza-Unspecified 10/28/2013   Moderna Covid-19 Fall Seasonal Vaccine 39yrs & older 12/27/2021   PFIZER Comirnaty(Gray Top)Covid-19 Tri-Sucrose Vaccine 09/17/2020   PFIZER(Purple Top)SARS-COV-2 Vaccination 04/03/2019, 04/24/2019, 11/27/2019   Pneumococcal Conjugate-13 07/31/2014   Pneumococcal Polysaccharide-23 08/11/2009   Tdap 08/11/2009, 10/16/2016   Zoster Recombinant(Shingrix) 08/25/2020, 10/29/2020   Zoster, Live 12/19/2011    Screening Tests Health Maintenance  Topic Date Due   COVID-19 Vaccine (6 - 2024-25 season) 10/29/2022   INFLUENZA VACCINE  09/28/2023   OPHTHALMOLOGY EXAM  12/08/2023  HEMOGLOBIN A1C  12/27/2023   FOOT EXAM  06/26/2024   Medicare Annual Wellness (AWV)  10/24/2024   DTaP/Tdap/Td (3 - Td or Tdap) 10/17/2026   Pneumococcal Vaccine: 50+ Years  Completed   DEXA SCAN  Completed   Zoster Vaccines- Shingrix  Completed   HPV VACCINES  Aged Out   Meningococcal B Vaccine  Aged Out    Health Maintenance  Health Maintenance Due  Topic Date Due   COVID-19 Vaccine (6 - 2024-25 season) 10/29/2022   INFLUENZA VACCINE  09/28/2023   Health Maintenance Items Addressed: UP TO DATE ON TDAP &  SHINGRIX, NEEDS COVID & PNA; AGED OUT OF MAMMOGRAM, COLONOSCOPY & BDS  Additional Screening:  Vision Screening: Recommended annual ophthalmology exams for early detection of glaucoma and other disorders of the eye. Would you like a referral to an eye doctor? No    Dental Screening: Recommended annual dental exams for proper oral hygiene  Community Resource Referral / Chronic Care Management: CRR required this visit?  No   CCM required this visit?  No   Plan:    I have personally reviewed and noted the following in the patient's chart:   Medical and social history Use of alcohol , tobacco or illicit drugs  Current medications and supplements including opioid prescriptions. Patient is not currently taking opioid prescriptions. Functional ability and status Nutritional status Physical activity Advanced directives List of other physicians Hospitalizations, surgeries, and ER visits in previous 12 months Vitals Screenings to include cognitive, depression, and falls Referrals and appointments  In addition, I have reviewed and discussed with patient certain preventive protocols, quality metrics, and best practice recommendations. A written personalized care plan for preventive services as well as general preventive health recommendations were provided to patient.   Jhonnie GORMAN Das, LPN   1/71/7974   After Visit Summary: (MyChart) Due to this being a telephonic visit, the after visit summary with patients personalized plan was offered to patient via MyChart   Notes: Nothing significant to report at this time.

## 2023-10-25 NOTE — Patient Instructions (Signed)
 Emma Campbell , Thank you for taking time out of your busy schedule to complete your Annual Wellness Visit with me. I enjoyed our conversation and look forward to speaking with you again next year. I, as well as your care team,  appreciate your ongoing commitment to your health goals. Please review the following plan we discussed and let me know if I can assist you in the future.   Follow up Visits: 10/31/24 @ 10:50 AM BY PHONE We will see or speak with you next year for your Next Medicare AWV with our clinical staff Have you seen your provider in the last 6 months (3 months if uncontrolled diabetes)? Yes  Clinician Recommendations:  Aim for 30 minutes of exercise or brisk walking, 6-8 glasses of water , and 5 servings of fruits and vegetables each day. TAKE CARE!      This is a list of the screenings recommended for you:  Health Maintenance  Topic Date Due   COVID-19 Vaccine (6 - 2024-25 season) 10/29/2022   Flu Shot  09/28/2023   Eye exam for diabetics  12/08/2023   Hemoglobin A1C  12/27/2023   Complete foot exam   06/26/2024   Medicare Annual Wellness Visit  10/24/2024   DTaP/Tdap/Td vaccine (3 - Td or Tdap) 10/17/2026   Pneumococcal Vaccine for age over 28  Completed   DEXA scan (bone density measurement)  Completed   Zoster (Shingles) Vaccine  Completed   HPV Vaccine  Aged Out   Meningitis B Vaccine  Aged Out    Advanced directives: (ACP Link)Information on Advanced Care Planning can be found at [place]  Secretary of [facility] Advance Health Care Directives Advance Health Care Directives. [email or web address]  Advance Care Planning is important because it:  [x]  Makes sure you receive the medical care that is consistent with your values, goals, and preferences  [x]  It provides guidance to your family and loved ones and reduces their decisional burden about whether or not they are making the right decisions based on your wishes.  Follow the link provided in your after visit summary or read over  the paperwork we have mailed to you to help you started getting your Advance Directives in place. If you need assistance in completing these, please reach out to us  so that we can help you!

## 2023-10-30 ENCOUNTER — Ambulatory Visit: Admitting: Family Medicine

## 2023-10-30 DIAGNOSIS — E1169 Type 2 diabetes mellitus with other specified complication: Secondary | ICD-10-CM

## 2023-11-06 ENCOUNTER — Other Ambulatory Visit: Payer: Self-pay | Admitting: Family Medicine

## 2023-11-06 DIAGNOSIS — E1122 Type 2 diabetes mellitus with diabetic chronic kidney disease: Secondary | ICD-10-CM

## 2023-11-06 NOTE — Telephone Encounter (Unsigned)
 Copied from CRM [id number]. Topic: Clinical - Medication Refill >> Nov 06, 2023 11:00 AM Wess RAMAN wrote: Medication: amLODipine -valsartan  (EXFORGE ) 5-160 MG tablet   Has the patient contacted their pharmacy? Yes (Agent: If no, request that the patient contact the pharmacy for the refill. If patient does not wish to contact the pharmacy document the reason why and proceed with request.) (Agent: If yes, when and what did the pharmacy advise?) They said they would contact the doctor's office.  This is the patient's preferred pharmacy:  [facility] [address] ([address]), [place] - [address] [place]) [place] [address] Phone: [phone] Fax: [phone]  Is this the correct pharmacy for this prescription? Yes If no, delete pharmacy and type the correct one.   Has the prescription been filled recently? Yes  Is the patient out of the medication? Yes  Has the patient been seen for an appointment in the last year OR does the patient have an upcoming appointment? Yes  Can we respond through MyChart? Yes  Agent: Please be advised that Rx refills may take up to 3 business days. We ask that you follow-up with your pharmacy.

## 2023-11-07 MED ORDER — AMLODIPINE BESYLATE-VALSARTAN 5-160 MG PO TABS: 1.0000 | ORAL_TABLET | Freq: Every day | ORAL | 0 refills | Status: DC

## 2023-11-07 NOTE — Telephone Encounter (Signed)
 Labs in date.  Requested Prescriptions  Pending Prescriptions Disp Refills   amLODipine -valsartan  (EXFORGE ) 5-160 MG tablet 90 tablet 0    Sig: Take 1 tablet by mouth daily.     Cardiovascular: CCB + ARB Combos Failed - 11/07/2023  1:58 PM      Failed - Cr in normal range and within 180 days    Creatinine  Date Value Ref Range Status  07/09/2023 1.02 (H) 0.44 - 1.00 mg/dL Final   Creat  Date Value Ref Range Status  09/28/2022 1.09 (H) 0.60 - 0.95 mg/dL Final   Creatinine, Urine  Date Value Ref Range Status  09/28/2022 104 20 - 275 mg/dL Final         Passed - K in normal range and within 180 days    Potassium  Date Value Ref Range Status  07/09/2023 3.8 3.5 - 5.1 mmol/L Final         Passed - Na in normal range and within 180 days    Sodium  Date Value Ref Range Status  07/09/2023 139 135 - 145 mmol/L Final  11/30/2014 140 134 - 144 mmol/L Final    Comment:    **Effective December 14, 2014 the reference interval**   for Sodium, Serum will be changing to:                                             136 - 144          Passed - Patient is not pregnant      Passed - Last BP in normal range    BP Readings from Last 1 Encounters:  08/23/23 (!) 120/58         Passed - Valid encounter within last 6 months    Recent Outpatient Visits           2 months ago Acute cough   [facility] Gareth Mliss FALCON, FNP   4 months ago Controlled type 2 diabetes mellitus with stage 3 chronic kidney disease, without long-term current use of insulin [facility])   [facility] Glenard Mire, MD

## 2023-11-27 ENCOUNTER — Telehealth: Payer: Self-pay | Admitting: Family Medicine

## 2023-11-27 NOTE — Telephone Encounter (Signed)
 Copied from CRM [id number]. Topic: Clinical - Medical Advice >> Nov 27, 2023 12:34 PM Donee H wrote: Reason for CRM: Patient calling wanting to know what shots is she needing done. She states she knows it's time for flu shot but would like to know if it's any other ones that are due.  She also wants to know how many shots can she have at one time.Please follow up with patient.  [phone]

## 2023-11-27 NOTE — Telephone Encounter (Signed)
Called pt back no answer unable to leave vm.

## 2023-12-18 ENCOUNTER — Telehealth: Payer: Self-pay | Admitting: Family Medicine

## 2023-12-18 NOTE — Telephone Encounter (Unsigned)
 Copied from CRM #[id number]. Topic: Clinical - Medication Refill >> Dec 18, 2023 10:54 AM Avram MATSU wrote: Medication: Gordy calling from [employer] is requesting atorvastatin  (LIPITOR) 80 MG tablet [[id number]]  Has the patient contacted their pharmacy? Yes (Agent: If no, request that the patient contact the pharmacy for the refill. If patient does not wish to contact the pharmacy document the reason why and proceed with request.) (Agent: If yes, when and what did the pharmacy advise?)  This is the patient's preferred pharmacy:  [facility] [address] ([address]), [place] - [address] [place]) [place] [address] Phone: [phone] Fax: [phone]    Is this the correct pharmacy for this prescription? Yes If no, delete pharmacy and type the correct one.   Has the prescription been filled recently? No  Is the patient out of the medication? No  Has the patient been seen for an appointment in the last year OR does the patient have an upcoming appointment? Yes  Can we respond through MyChart? Yes  Agent: Please be advised that Rx refills may take up to 3 business days. We ask that you follow-up with your pharmacy.

## 2023-12-31 ENCOUNTER — Other Ambulatory Visit: Payer: Self-pay | Admitting: Family Medicine

## 2023-12-31 ENCOUNTER — Ambulatory Visit: Admitting: Family Medicine

## 2023-12-31 ENCOUNTER — Encounter: Payer: Self-pay | Admitting: Family Medicine

## 2023-12-31 VITALS — BP 150/66 | HR 82 | Resp 16 | Ht 61.0 in | Wt 100.7 lb

## 2023-12-31 DIAGNOSIS — Z23 Encounter for immunization: Secondary | ICD-10-CM | POA: Diagnosis not present

## 2023-12-31 DIAGNOSIS — I442 Atrioventricular block, complete: Secondary | ICD-10-CM | POA: Diagnosis not present

## 2023-12-31 DIAGNOSIS — M8589 Other specified disorders of bone density and structure, multiple sites: Secondary | ICD-10-CM

## 2023-12-31 DIAGNOSIS — E1122 Type 2 diabetes mellitus with diabetic chronic kidney disease: Secondary | ICD-10-CM

## 2023-12-31 DIAGNOSIS — E1169 Type 2 diabetes mellitus with other specified complication: Secondary | ICD-10-CM

## 2023-12-31 DIAGNOSIS — N183 Chronic kidney disease, stage 3 unspecified: Secondary | ICD-10-CM

## 2023-12-31 DIAGNOSIS — E559 Vitamin D deficiency, unspecified: Secondary | ICD-10-CM

## 2023-12-31 DIAGNOSIS — I129 Hypertensive chronic kidney disease with stage 1 through stage 4 chronic kidney disease, or unspecified chronic kidney disease: Secondary | ICD-10-CM

## 2023-12-31 DIAGNOSIS — I495 Sick sinus syndrome: Secondary | ICD-10-CM | POA: Diagnosis not present

## 2023-12-31 DIAGNOSIS — D500 Iron deficiency anemia secondary to blood loss (chronic): Secondary | ICD-10-CM

## 2023-12-31 DIAGNOSIS — Z8719 Personal history of other diseases of the digestive system: Secondary | ICD-10-CM

## 2023-12-31 DIAGNOSIS — I480 Paroxysmal atrial fibrillation: Secondary | ICD-10-CM

## 2023-12-31 DIAGNOSIS — E538 Deficiency of other specified B group vitamins: Secondary | ICD-10-CM

## 2023-12-31 LAB — POCT GLYCOSYLATED HEMOGLOBIN (HGB A1C): Hemoglobin A1C: 6.6 % — AB (ref 4.0–5.6)

## 2023-12-31 MED ORDER — EZETIMIBE 10 MG PO TABS
10.0000 mg | ORAL_TABLET | Freq: Every day | ORAL | 1 refills | Status: AC
Start: 2023-12-31 — End: ?

## 2023-12-31 MED ORDER — ATORVASTATIN CALCIUM 80 MG PO TABS: 80.0000 mg | ORAL_TABLET | Freq: Every day | ORAL | 1 refills | Status: AC

## 2023-12-31 MED ORDER — AMLODIPINE BESYLATE-VALSARTAN 5-160 MG PO TABS: 1.0000 | ORAL_TABLET | Freq: Every day | ORAL | 1 refills | Status: AC

## 2023-12-31 MED ORDER — EMPAGLIFLOZIN 10 MG PO TABS: 10.0000 mg | ORAL_TABLET | Freq: Every day | ORAL | 1 refills | Status: AC

## 2023-12-31 NOTE — Progress Notes (Signed)
 Name: Emma Campbell   MRN: [id number]    DOB: [birth year]   Date:12/31/2023       Progress Note  Subjective  Chief Complaint  Chief Complaint  Patient presents with   Medical Management of Chronic Issues   Discussed the use of AI scribe software for clinical note transcription with the patient, who gave verbal consent to proceed.  History of Present Illness Emma Campbell is an 89 year old female with type 2 diabetes, chronic kidney disease stage 3, and hypertension who presents for a regular six-month follow-up.  She has no new symptoms or changes in her condition since the last visit. Her type 2 diabetes is not currently treated with medication due to previous weight loss concerns. Her A1c has decreased slightly from 6.7% to 6.6% since April.  Her chronic kidney disease is stage 3A, with an eGFR ranging from 48 to 53.  She has dyslipidemia, with her last cholesterol check showing an LDL level of 82 mg/dL. She is on atorvastatin  80 mg for cholesterol management.  Her hypertension is associated with her type 2 diabetes and chronic kidney disease. She does not monitor her blood pressure at home due to a broken machine. Her blood pressure was high during today's visit, but she states it is usually controlled.  She has a history of sick sinus syndrome and paroxysmal atrial fibrillation, for which she takes Eliquis twice daily. She also has a cardiac pacemaker due to a history of complete heart block. No palpitations or chest pain.  She has iron  deficiency anemia and a history of gastrointestinal bleeding, with previous findings of angioectasis in the stomach and AVMs in the duodenum. She is monitored for anemia and had a normal colonoscopy.  She takes vitamin D  and B12 supplements for deficiencies and has received her flu shot.    Patient Active Problem List   Diagnosis Date Noted   Dyslipidemia associated with type 2 diabetes mellitus (HCC) 09/28/2022   Anemia in chronic kidney disease  (CKD) 12/26/2021   Osteopenia of multiple sites 09/19/2021   B12 deficiency 05/21/2017   Gastrointestinal tract imaging abnormality    Iron  deficiency anemia    Polyp of sigmoid colon    Arthritis, degenerative 05/03/2015   Menopausal and perimenopausal disorder 05/03/2015   Chronic kidney disease (CKD), stage III (moderate) (HCC) 11/30/2014   Diverticulosis 11/30/2014   Fibrocystic breast disease 11/30/2014   Systolic ejection murmur 11/30/2014   Paroxysmal A-fib (HCC) 11/30/2014   Microalbuminuria 07/31/2014   Presence of cardiac pacemaker for complete AV block (HCC) 07/31/2014   Sick sinus syndrome (HCC) 08/13/2013   Hypercholesteremia 08/13/2013   Hypertension, benign 08/13/2013   Type 2 diabetes mellitus with stage 3 chronic kidney disease and hypertension (HCC) 08/13/2013   Vitamin D  deficiency 04/12/2009    Past Surgical History:  Procedure Laterality Date   CATARACT EXTRACTION W/PHACO Right 01/07/2019   Procedure: CATARACT EXTRACTION PHACO AND INTRAOCULAR LENS PLACEMENT (IOC) RIGHT DIABETIC 02:15.1          25.7%        34.69;  Surgeon: Jaye Fallow, MD;  Location: [facility];  Service: Ophthalmology;  Laterality: Right;  Diabetic - oral meds   CATARACT EXTRACTION W/PHACO Left 01/28/2019   Procedure: CATARACT EXTRACTION PHACO AND INTRAOCULAR LENS PLACEMENT (IOC) LEFT DIABETIC 12.96  01:15.5;  Surgeon: Jaye Fallow, MD;  Location: [facility];  Service: Ophthalmology;  Laterality: Left;  Diabetic - oral meds   COLONOSCOPY WITH PROPOFOL  N/A 11/06/2016  Procedure: COLONOSCOPY WITH PROPOFOL ;  Surgeon: Jinny Carmine, MD;  Location: [facility];  Service: Endoscopy;  Laterality: N/A;  DIABETIC   ESOPHAGOGASTRODUODENOSCOPY (EGD) WITH PROPOFOL  N/A 11/06/2016   Procedure: ESOPHAGOGASTRODUODENOSCOPY (EGD) WITH PROPOFOL ;  Surgeon: Jinny Carmine, MD;  Location: [facility];  Service: Endoscopy;  Laterality: N/A;   ESOPHAGOGASTRODUODENOSCOPY (EGD)  WITH PROPOFOL  N/A 01/23/2017   Procedure: ESOPHAGOGASTRODUODENOSCOPY (EGD) WITH PROPOFOL  with PUSH;  Surgeon: Jinny Carmine, MD;  Location: [facility];  Service: Endoscopy;  Laterality: N/A;   GIVENS CAPSULE STUDY N/A 12/22/2016   Procedure: GIVENS CAPSULE STUDY;  Surgeon: Jinny Carmine, MD;  Location: [facility];  Service: Endoscopy;  Laterality: N/A;   INSERT / REPLACE / REMOVE PACEMAKER     PACEMAKER INSERTION  2006   POLYPECTOMY  11/06/2016   Procedure: POLYPECTOMY;  Surgeon: Jinny Carmine, MD;  Location: [facility];  Service: Endoscopy;;   PPM GENERATOR CHANGEOUT N/A 03/30/2022   Procedure: PPM GENERATOR CHANGEOUT;  Surgeon: Ammon Blunt, MD;  Location: [facility] IN[facility]AS[facility];  Service: Cardiovascular;  Laterality: N/A;    Family History  Problem Relation Age of Onset   Hypertension Mother    Stroke Mother    Diabetes Daughter    Stroke Father    Healthy Sister    Hypertension Maternal Grandmother     Social History   Tobacco Use   Smoking status: Never   Smokeless tobacco: Never   Tobacco comments:    smoking cessation materials not required  Substance Use Topics   Alcohol  use: No    Alcohol /week: 0.0 standard drinks of alcohol      Current Outpatient Medications:    ACCU-CHEK GUIDE test strip, USE  AS  INSTRUCTED  DAILY, Disp: 100 strip, Rfl: 2   Accu-Chek Softclix Lancets lancets, USE AS DIRECTED EVERY DAY, Disp: 100 each, Rfl: 2   Alcohol  Swabs  (DROPSAFE ALCOHOL  PREP) 70 % PADS, USE AS DIRECTED THREE TIMES DAILY, Disp: 300 each, Rfl: 0   amiodarone (PACERONE) 200 MG tablet, Take 100 mg by mouth daily., Disp: , Rfl:    apixaban (ELIQUIS) 2.5 MG TABS tablet, Take 2.5 mg by mouth daily., Disp: , Rfl:    Blood Glucose Monitoring Suppl (ACCU-CHEK GUIDE ME) w/Device KIT, 1 each by Does not apply route daily., Disp: 1 kit, Rfl: 0   cholecalciferol (VITAMIN D ) 1000 UNITS tablet, Take 1,000 Units by mouth daily. , Disp: , Rfl:    empagliflozin (JARDIANCE) 10  MG TABS tablet, Take 1 tablet (10 mg total) by mouth daily., Disp: 90 tablet, Rfl: 1   ezetimibe (ZETIA) 10 MG tablet, Take 1 tablet (10 mg total) by mouth daily., Disp: 90 tablet, Rfl: 1   folic acid  (FOLVITE ) 400 MCG tablet, Take 400 mcg by mouth daily., Disp: , Rfl:    Multiple Vitamins-Minerals (CENTRUM SILVER 50+WOMEN PO), Take 1 tablet by mouth daily., Disp: , Rfl:    amLODipine -valsartan  (EXFORGE ) 5-160 MG tablet, Take 1 tablet by mouth daily., Disp: 100 tablet, Rfl: 1   atorvastatin  (LIPITOR) 80 MG tablet, Take 1 tablet (80 mg total) by mouth daily., Disp: 90 tablet, Rfl: 1  Allergies  Allergen Reactions   Ace Inhibitors Other (See Comments)    Pt unable to recall. Was advised to avoid    I personally reviewed active problem list, medication list, allergies with the patient/caregiver today.   ROS  Ten systems reviewed and is negative except as mentioned in HPI    Objective Physical Exam VITALS: BP- 128/76 CONSTITUTIONAL: Patient appears well-developed  and well-nourished. No distress. HEENT: Head atraumatic, normocephalic, neck supple. Hearing grossly intact. CARDIOVASCULAR: Normal rate, regular rhythm. Systolic murmur present. No BLE edema. PULMONARY: Effort normal and breath sounds normal. No respiratory distress. ABDOMINAL: There is no tenderness or distention. MUSCULOSKELETAL: Normal gait. Without gross motor or sensory deficit. PSYCHIATRIC: Patient has a normal mood and affect. Behavior is normal. Judgment and thought content normal.  Vitals:   12/31/23 1050 12/31/23 1143  BP: (!) 158/74 (!) 150/66  Pulse: 82   Resp: 16   SpO2: 98%   Weight: 100 lb 11.2 oz (45.7 kg)   Height: 5' 1 (1.549 m)     Body mass index is 19.03 kg/m.  Recent Results (from the past 2160 hours)  POCT glycosylated hemoglobin (Hb A1C)     Status: Abnormal   Collection Time: 12/31/23 10:54 AM  Result Value Ref Range   Hemoglobin A1C 6.6 (A) 4.0 - 5.6 %   HbA1c POC (<> result, manual  entry)     HbA1c, POC (prediabetic range)     HbA1c, POC (controlled diabetic range)      Diabetic Foot Exam:     PHQ2/9:    12/31/2023   10:41 AM 10/25/2023    2:46 PM 06/27/2023    9:21 AM 10/19/2022    3:07 PM 09/28/2022    7:48 AM  Depression screen PHQ 2/9  Decreased Interest 0 0 0 0 0  Down, Depressed, Hopeless 0 0 0 0 0  PHQ - 2 Score 0 0 0 0 0  Altered sleeping  0 0  0  Tired, decreased energy  0 0  0  Change in appetite  0 0  0  Feeling bad or failure about yourself   0 0  0  Trouble concentrating  0 0  0  Moving slowly or fidgety/restless  0 0  0  Suicidal thoughts  0 0  0  PHQ-9 Score  0 0  0  Difficult doing work/chores  Not difficult at all Not difficult at all  Not difficult at all    phq 9 is negative  Fall Risk:    12/31/2023   10:41 AM 10/25/2023    2:48 PM 08/23/2023    9:18 AM 06/27/2023    9:27 AM 02/27/2023    9:40 AM  Fall Risk   Falls in the past year? 0 0 0 0 0  Number falls in past yr: 0 0 0 0 0  Injury with Fall? 0 0 0 0 0  Risk for fall due to : No Fall Risks No Fall Risks  No Fall Risks No Fall Risks  Follow up Falls evaluation completed Falls evaluation completed;Falls prevention discussed Falls evaluation completed Falls prevention discussed;Education provided;Falls evaluation completed Falls evaluation completed      Assessment & Plan Type 2 diabetes mellitus with chronic kidney disease stage 3A, hypertension, and dyslipidemia Type 2 diabetes managed without medication, A1c 6.6%. CKD stage 3A with eGFR 48-53. Hypertension present, no home BP readings. Dyslipidemia managed with atorvastatin , LDL 82 mg/dL. - Prescribe Jardiance 10 mg for kidney protection and mild glucose control, discussed side effects. - Check urine microalbumin. - Recheck blood pressure before leaving office. - Advise obtaining new blood pressure machine for home monitoring. - Continue atorvastatin  80 mg. -  Adding  Zetia today  for further LDL reduction.  Sick sinus  syndrome and paroxysmal atrial fibrillation, status post cardiac pacemaker for complete heart block Managed with pacemaker, pacerone  and Eliquis. No recent palpitations  or medication side effects. BP 128/76 mmHg at last cardiology visit. - Continue Eliquis as prescribed by cardiologist. - Continue monitoring by cardiologist.  Iron  deficiency anemia secondary to chronic gastrointestinal blood loss Secondary to chronic GI blood loss with angioectasias and AVMs.  Deficiency of vitamin B12 and vitamin D  Deficiencies managed with supplements, recent levels normal. - Continue vitamin B12 and vitamin D  supplementation.  Other specified disorders of bone density and structure, multiple sites Bone density disorders monitored, bone loss noted.  General Health Maintenance Health maintenance up to date, including flu vaccination and Medicare wellness check. - Administer flu shot if not already given. - Ensure completion of Medicare wellness check.

## 2023-12-31 NOTE — Telephone Encounter (Unsigned)
 Copied from CRM [id number]. Topic: Clinical - Medication Refill >> Dec 31, 2023 10:03 AM Zy'onna H wrote: Medication: Atorvastatin  atorvastatin  (LIPITOR) 80 MG tablet (90 day supply) **The Pharmacist: As[place] from [facility] called in to request the following Rx refill**  Has the patient contacted their pharmacy? Yes (Agent: If no, request that the patient contact the pharmacy for the refill. If patient does not wish to contact the pharmacy document the reason why and proceed with request.) (Agent: If yes, when and what did the pharmacy advise?)  This is the patient's preferred pharmacy:  [facility] [address] ([address]), [place] - [address] [place]) [place] [address] Phone: [phone] Fax: [phone]   Is this the correct pharmacy for this prescription? Yes If no, delete pharmacy and type the correct one.   Has the prescription been filled recently? Yes  Is the patient out of the medication? Yes  Has the patient been seen for an appointment in the last year OR does the patient have an upcoming appointment? Yes  Can we respond through MyChart? Yes  Agent: Please be advised that Rx refills may take up to 3 business days. We ask that you follow-up with your pharmacy.

## 2024-01-01 ENCOUNTER — Ambulatory Visit: Payer: Self-pay | Admitting: Family Medicine

## 2024-01-01 LAB — MICROALBUMIN / CREATININE URINE RATIO
Creatinine, Urine: 143 mg/dL (ref 20–275)
Microalb Creat Ratio: 34 mg/g{creat} — ABNORMAL HIGH (ref <30)
Microalb, Ur: 4.8 mg/dL

## 2024-01-01 NOTE — Telephone Encounter (Signed)
 Refilled 12/31/23, duplicate request. Requested Prescriptions  Pending Prescriptions Disp Refills   atorvastatin  (LIPITOR) 80 MG tablet 90 tablet 1    Sig: Take 1 tablet (80 mg total) by mouth daily.     Cardiovascular:  Antilipid - Statins Failed - 01/01/2024  3:50 PM      Failed - Lipid Panel in normal range within the last 12 months    Cholesterol, Total  Date Value Ref Range Status  04/05/2015 202 (H) 100 - 199 mg/dL Final   Cholesterol  Date Value Ref Range Status  09/28/2022 181 <200 mg/dL Final   LDL Cholesterol (Calc)  Date Value Ref Range Status  09/28/2022 85 mg/dL (calc) Final    Comment:    Reference range: <100 . Desirable range <100 mg/dL for primary prevention;   <70 mg/dL for patients with CHD or diabetic patients  with > or = 2 CHD risk factors. SABRA LDL-C is now calculated using the Martin-Hopkins  calculation, which is a validated novel method providing  better accuracy than the Friedewald equation in the  estimation of LDL-C.  Gladis APPLETHWAITE et al. SANDREA. 7986;689(80): 2061-2068  (http://education.[employer].com/faq/FAQ164)    HDL  Date Value Ref Range Status  09/28/2022 82 > OR = 50 mg/dL Final  97/93/7982 896 >60 mg/dL Final   Triglycerides  Date Value Ref Range Status  09/28/2022 55 <150 mg/dL Final         Passed - Patient is not pregnant      Passed - Valid encounter within last 12 months    Recent Outpatient Visits           Yesterday Sick sinus syndrome [facility])   [facility] Glenard Mire, MD   4 months ago Acute cough   [facility] Gareth Clarity F, FNP   6 months ago Controlled type 2 diabetes mellitus with stage 3 chronic kidney disease, without long-term current use of insulin [facility])   [facility] Glenard Mire, MD

## 2024-01-08 ENCOUNTER — Inpatient Hospital Stay: Attending: Oncology

## 2024-01-08 DIAGNOSIS — Z833 Family history of diabetes mellitus: Secondary | ICD-10-CM | POA: Insufficient documentation

## 2024-01-08 DIAGNOSIS — Z823 Family history of stroke: Secondary | ICD-10-CM | POA: Diagnosis not present

## 2024-01-08 DIAGNOSIS — D509 Iron deficiency anemia, unspecified: Secondary | ICD-10-CM | POA: Insufficient documentation

## 2024-01-08 DIAGNOSIS — D631 Anemia in chronic kidney disease: Secondary | ICD-10-CM | POA: Insufficient documentation

## 2024-01-08 DIAGNOSIS — Z8249 Family history of ischemic heart disease and other diseases of the circulatory system: Secondary | ICD-10-CM | POA: Diagnosis not present

## 2024-01-08 DIAGNOSIS — I129 Hypertensive chronic kidney disease with stage 1 through stage 4 chronic kidney disease, or unspecified chronic kidney disease: Secondary | ICD-10-CM | POA: Insufficient documentation

## 2024-01-08 DIAGNOSIS — N183 Chronic kidney disease, stage 3 unspecified: Secondary | ICD-10-CM | POA: Insufficient documentation

## 2024-01-08 DIAGNOSIS — E1122 Type 2 diabetes mellitus with diabetic chronic kidney disease: Secondary | ICD-10-CM | POA: Insufficient documentation

## 2024-01-08 DIAGNOSIS — Z7901 Long term (current) use of anticoagulants: Secondary | ICD-10-CM | POA: Diagnosis not present

## 2024-01-08 DIAGNOSIS — I4891 Unspecified atrial fibrillation: Secondary | ICD-10-CM | POA: Diagnosis not present

## 2024-01-08 DIAGNOSIS — Z780 Asymptomatic menopausal state: Secondary | ICD-10-CM | POA: Insufficient documentation

## 2024-01-08 DIAGNOSIS — E785 Hyperlipidemia, unspecified: Secondary | ICD-10-CM | POA: Insufficient documentation

## 2024-01-08 DIAGNOSIS — M810 Age-related osteoporosis without current pathological fracture: Secondary | ICD-10-CM | POA: Insufficient documentation

## 2024-01-08 DIAGNOSIS — E538 Deficiency of other specified B group vitamins: Secondary | ICD-10-CM | POA: Insufficient documentation

## 2024-01-08 DIAGNOSIS — N6019 Diffuse cystic mastopathy of unspecified breast: Secondary | ICD-10-CM | POA: Diagnosis not present

## 2024-01-08 DIAGNOSIS — Z9841 Cataract extraction status, right eye: Secondary | ICD-10-CM | POA: Diagnosis not present

## 2024-01-08 DIAGNOSIS — Z9842 Cataract extraction status, left eye: Secondary | ICD-10-CM | POA: Insufficient documentation

## 2024-01-08 DIAGNOSIS — Z79899 Other long term (current) drug therapy: Secondary | ICD-10-CM | POA: Diagnosis not present

## 2024-01-08 LAB — CMP (CANCER CENTER ONLY)
ALT: 26 U/L (ref 0–44)
AST: 29 U/L (ref 15–41)
Albumin: 3.8 g/dL (ref 3.5–5.0)
Alkaline Phosphatase: 59 U/L (ref 38–126)
Anion gap: 9 (ref 5–15)
BUN: 20 mg/dL (ref 8–23)
CO2: 25 mmol/L (ref 22–32)
Calcium: 8.8 mg/dL — ABNORMAL LOW (ref 8.9–10.3)
Chloride: 103 mmol/L (ref 98–111)
Creatinine: 1.19 mg/dL — ABNORMAL HIGH (ref 0.44–1.00)
GFR, Estimated: 44 mL/min — ABNORMAL LOW (ref >60)
Glucose, Bld: 118 mg/dL — ABNORMAL HIGH (ref 70–99)
Potassium: 3.9 mmol/L (ref 3.5–5.1)
Sodium: 137 mmol/L (ref 135–145)
Total Bilirubin: 0.7 mg/dL (ref 0.0–1.2)
Total Protein: 6.8 g/dL (ref 6.5–8.1)

## 2024-01-08 LAB — CBC WITH DIFFERENTIAL (CANCER CENTER ONLY)
Abs Immature Granulocytes: 0.01 K/uL (ref 0.00–0.07)
Basophils Absolute: 0 K/uL (ref 0.0–0.1)
Basophils Relative: 1 %
Eosinophils Absolute: 0.2 K/uL (ref 0.0–0.5)
Eosinophils Relative: 5 %
HCT: 37.1 % (ref 36.0–46.0)
Hemoglobin: 11.9 g/dL — ABNORMAL LOW (ref 12.0–15.0)
Immature Granulocytes: 0 %
Lymphocytes Relative: 48 %
Lymphs Abs: 1.8 K/uL (ref 0.7–4.0)
MCH: 29.2 pg (ref 26.0–34.0)
MCHC: 32.1 g/dL (ref 30.0–36.0)
MCV: 90.9 fL (ref 80.0–100.0)
Monocytes Absolute: 0.3 K/uL (ref 0.1–1.0)
Monocytes Relative: 9 %
Neutro Abs: 1.3 K/uL — ABNORMAL LOW (ref 1.7–7.7)
Neutrophils Relative %: 37 %
Platelet Count: 188 K/uL (ref 150–400)
RBC: 4.08 MIL/uL (ref 3.87–5.11)
RDW: 13.2 % (ref 11.5–15.5)
WBC Count: 3.6 K/uL — ABNORMAL LOW (ref 4.0–10.5)
nRBC: 0 % (ref 0.0–0.2)

## 2024-01-08 LAB — VITAMIN B12: Vitamin B-12: 500 pg/mL (ref 180–914)

## 2024-01-08 LAB — IRON AND TIBC
Iron: 95 ug/dL (ref 28–170)
Saturation Ratios: 33 % — ABNORMAL HIGH (ref 10.4–31.8)
TIBC: 284 ug/dL (ref 250–450)
UIBC: 189 ug/dL

## 2024-01-08 LAB — FERRITIN: Ferritin: 168 ng/mL (ref 11–307)

## 2024-01-08 LAB — RETIC PANEL
Immature Retic Fract: 4.1 % (ref 2.3–15.9)
RBC.: 4.09 MIL/uL (ref 3.87–5.11)
Retic Count, Absolute: 31.1 K/uL (ref 19.0–186.0)
Retic Ct Pct: 0.8 % (ref 0.4–3.1)
Reticulocyte Hemoglobin: 33.5 pg (ref >27.9)

## 2024-01-09 ENCOUNTER — Ambulatory Visit

## 2024-01-09 VITALS — BP 128/70

## 2024-01-09 DIAGNOSIS — Z0130 Encounter for examination of blood pressure without abnormal findings: Secondary | ICD-10-CM

## 2024-01-09 DIAGNOSIS — I10 Essential (primary) hypertension: Secondary | ICD-10-CM

## 2024-01-09 NOTE — Progress Notes (Signed)
 Patient is in office today for a nurse visit for Blood Pressure Check. Patient blood pressure was 128/70, Patient No chest pain, No shortness of breath, No dyspnea on exertion

## 2024-01-11 ENCOUNTER — Encounter: Payer: Self-pay | Admitting: Oncology

## 2024-01-11 ENCOUNTER — Inpatient Hospital Stay: Admitting: Oncology

## 2024-01-11 VITALS — BP 138/69 | HR 88 | Temp 97.8°F | Resp 18 | Wt 99.5 lb

## 2024-01-11 DIAGNOSIS — E538 Deficiency of other specified B group vitamins: Secondary | ICD-10-CM

## 2024-01-11 DIAGNOSIS — N183 Chronic kidney disease, stage 3 unspecified: Secondary | ICD-10-CM | POA: Diagnosis not present

## 2024-01-11 DIAGNOSIS — I480 Paroxysmal atrial fibrillation: Secondary | ICD-10-CM | POA: Diagnosis not present

## 2024-01-11 DIAGNOSIS — I129 Hypertensive chronic kidney disease with stage 1 through stage 4 chronic kidney disease, or unspecified chronic kidney disease: Secondary | ICD-10-CM | POA: Diagnosis not present

## 2024-01-11 DIAGNOSIS — N189 Chronic kidney disease, unspecified: Secondary | ICD-10-CM

## 2024-01-11 DIAGNOSIS — D631 Anemia in chronic kidney disease: Secondary | ICD-10-CM

## 2024-01-11 NOTE — Assessment & Plan Note (Signed)
 Stable B12 level. Continue vitamin B12 1000mcg once  per week.

## 2024-01-11 NOTE — Assessment & Plan Note (Addendum)
#   Iron  deficiency anemia, Labs reviewed and discussed with patient Lab Results  Component Value Date   HGB 11.9 (L) 01/08/2024   TIBC 284 01/08/2024   IRONPCTSAT 33 (H) 01/08/2024   FERRITIN 168 01/08/2024     No need for erythropoietin replacement therapy.   Iron  panel is stable. Continue oral iron  supplementation 2-3 times per week.

## 2024-01-11 NOTE — Assessment & Plan Note (Signed)
 Encourage oral hydration and avoid nephrotoxins.

## 2024-01-11 NOTE — Progress Notes (Signed)
 Hematology/Oncology Progress note [phone] [phone]      Patient Care Team: Sowles, Krichna, MD as PCP - General (Family Medicine) Florencio Cara BIRCH, MD as Consulting Physician (Cardiology) Babara Call, MD as Consulting Physician (Oncology) Jaye Fallow, MD as Referring Physician (Ophthalmology)  ASSESSMENT & PLAN:   Anemia in chronic kidney disease (CKD) # Iron  deficiency anemia, Labs reviewed and discussed with patient Lab Results  Component Value Date   HGB 11.9 (L) 01/08/2024   TIBC 284 01/08/2024   IRONPCTSAT 33 (H) 01/08/2024   FERRITIN 168 01/08/2024     No need for erythropoietin replacement therapy.   Iron  panel is stable. Continue oral iron  supplementation 2-3 times per week.   B12 deficiency Stable B12 level. Continue vitamin B12 1000mcg once  per week.   Chronic kidney disease (CKD), stage III (moderate) (HCC) Encourage oral hydration and avoid nephrotoxins.    Paroxysmal A-fib (HCC) On Eliquis 2.5mg  BID  Orders Placed This Encounter  Procedures   CBC with Differential (Cancer Center Only)    Standing Status:   Future    Expected Date:   07/10/2024    Expiration Date:   10/08/2024   Iron  and TIBC    Standing Status:   Future    Expected Date:   07/10/2024    Expiration Date:   10/08/2024   Ferritin    Standing Status:   Future    Expected Date:   07/10/2024    Expiration Date:   10/08/2024   Retic Panel    Standing Status:   Future    Expected Date:   07/10/2024    Expiration Date:   10/08/2024   Vitamin B12    Standing Status:   Future    Expected Date:   07/10/2024    Expiration Date:   10/08/2024   Follow up in 6 months.  All questions were answered. The patient knows to call the clinic with any problems, questions or concerns.  Call Babara, MD, PhD [facility] Hematology Oncology 01/11/2024   REASON FOR VISIT Follow up for treatment of anemia  HISTORY OF PRESENTING ILLNESS:  Emma Campbell is a  89 y.o.  female  with PMH listed below who was referred to me for evaluation of anemia.  Patient follows up with primary care physician and has had labs done recently. 04/17/17 CBC showed hemoglobin 9, MCV 81.9, platelet 245,000, WBC 4.1, normal differential.  Iron  panel showed TIBC 442, ferritin 11, saturation 15%. Previous GI workup:  # 01/23/2017 EGD showed non bleeding angiotecsia, treated with APC. 12/22/2016 Capsule study showed duodenum AVM.  11/06/2016 colonoscopy showed non bleeding hemorroids, sigmoid polyp, and diverticulosis.   INTERVAL HISTORY Emma Campbell is a 89 y.o. female who has above history reviewed by me today presents for follow up for management of iron  deficiency anemia.  Patient takes oral iron  supplementation 2-3 times per weeks, and B12 supplementation once per week. Sh takes Eliquis 2.5mg  BID for A fib. No bleeding events.  No new complaints.she feels well.   Review of Systems  Constitutional:  Negative for chills, fever, malaise/fatigue and weight loss.  HENT:  Negative for sore throat.   Eyes:  Negative for redness.  Respiratory:  Negative for cough, shortness of breath and wheezing.   Cardiovascular:  Negative for chest pain, palpitations and leg swelling.  Gastrointestinal:  Negative for abdominal pain, blood in stool, nausea and vomiting.  Genitourinary:  Negative for dysuria.  Musculoskeletal:  Negative for myalgias.  Skin:  Negative for rash.  Neurological:  Negative for dizziness, tingling and tremors.  Endo/Heme/Allergies:  Does not bruise/bleed easily.  Psychiatric/Behavioral:  Negative for hallucinations.     MEDICAL HISTORY:  Past Medical History:  Diagnosis Date   Anemia    Chronic kidney disease, stage III (moderate) (HCC)    Diverticulosis    Elevated LFTs    Essential hypertension, malignant    Fibrocystic breast disease    Heart murmur    Hyperlipidemia    Menopausal problem    Osteoarthrosis    Osteoporosis    Peripheral autonomic neuropathy     Presence of permanent cardiac pacemaker    Sick sinus syndrome (HCC)    Dr. Dorthula   Type II diabetes mellitus with renal manifestations (HCC)    Vitamin D  deficiency     SURGICAL HISTORY: Past Surgical History:  Procedure Laterality Date   CATARACT EXTRACTION W/PHACO Right 01/07/2019   Procedure: CATARACT EXTRACTION PHACO AND INTRAOCULAR LENS PLACEMENT (IOC) RIGHT DIABETIC 02:15.1          25.7%        34.69;  Surgeon: Jaye Fallow, MD;  Location: [facility];  Service: Ophthalmology;  Laterality: Right;  Diabetic - oral meds   CATARACT EXTRACTION W/PHACO Left 01/28/2019   Procedure: CATARACT EXTRACTION PHACO AND INTRAOCULAR LENS PLACEMENT (IOC) LEFT DIABETIC 12.96  01:15.5;  Surgeon: Jaye Fallow, MD;  Location: [facility];  Service: Ophthalmology;  Laterality: Left;  Diabetic - oral meds   COLONOSCOPY WITH PROPOFOL  N/A 11/06/2016   Procedure: COLONOSCOPY WITH PROPOFOL ;  Surgeon: Jinny Carmine, MD;  Location: [facility];  Service: Endoscopy;  Laterality: N/A;  DIABETIC   ESOPHAGOGASTRODUODENOSCOPY (EGD) WITH PROPOFOL  N/A 11/06/2016   Procedure: ESOPHAGOGASTRODUODENOSCOPY (EGD) WITH PROPOFOL ;  Surgeon: Jinny Carmine, MD;  Location: [facility];  Service: Endoscopy;  Laterality: N/A;   ESOPHAGOGASTRODUODENOSCOPY (EGD) WITH PROPOFOL  N/A 01/23/2017   Procedure: ESOPHAGOGASTRODUODENOSCOPY (EGD) WITH PROPOFOL  with PUSH;  Surgeon: Jinny Carmine, MD;  Location: [facility] ENDOSCOPY;  Service: Endoscopy;  Laterality: N/A;   GIVENS CAPSULE STUDY N/A 12/22/2016   Procedure: GIVENS CAPSULE STUDY;  Surgeon: Jinny Carmine, MD;  Location: [facility] END[facility]COPY;  Service: Endoscopy;  Laterality: N/A;   INSERT / REPLACE / REMOVE PACEMAKER     PACEMAKER INSERTION  2006   POLYPECTOMY  11/06/2016   Procedure: POLYPECTOMY;  Surgeon: Jinny Carmine, MD;  Location: [facility];  Service: Endoscopy;;   PPM GENERATOR CHANGEOUT N/A 03/30/2022   Procedure: PPM GENERATOR  CHANGEOUT;  Surgeon: Ammon Blunt, MD;  Location: [facility] INVASIVE CV LAB;  Service: Cardiovascular;  Laterality: N/A;    SOCIAL HISTORY: Social History   Socioeconomic History   Marital status: Widowed    Spouse name: Alm   Number of children: 1   Years of education: 2 years of college   Highest education level: 12th grade  Occupational History   Occupation: Retired Human Resources Officer at [employer]  Tobacco Use   Smoking status: Never   Smokeless tobacco: Never   Tobacco comments:    smoking cessation materials not required  Vaping Use   Vaping status: Never Used  Substance and Sexual Activity   Alcohol  use: No    Alcohol /week: 0.0 standard drinks of alcohol    Drug use: No   Sexual activity: Never  Other Topics Concern   Not on file  Social History Narrative   She still has a house but her daughter and grand-daughter are staying at her house   She moved in with  her older sister ( 8 years older), to help her out.    Social Drivers of Corporate Investment Banker Strain: Low Risk  (10/25/2023)   Overall Financial Resource Strain (CARDIA)    Difficulty of Paying Living Expenses: Not hard at all  Food Insecurity: No Food Insecurity (10/25/2023)   Hunger Vital Sign    Worried About Running Out of Food in the Last Year: Never true    Ran Out of Food in the Last Year: Never true  Transportation Needs: No Transportation Needs (10/25/2023)   PRAPARE - Administrator, Civil Service (Medical): No    Lack of Transportation (Non-Medical): No  Physical Activity: Insufficiently Active (10/25/2023)   Exercise Vital Sign    Days of Exercise per Week: 3 days    Minutes of Exercise per Session: 30 min  Stress: No Stress Concern Present (10/25/2023)   [employer] of Occupational Health - Occupational Stress Questionnaire    Feeling of Stress: Not at all  Social Connections: Moderately Isolated (10/25/2023)   Social Connection and Isolation Panel    Frequency of  Communication with Friends and Family: More than three times a week    Frequency of Social Gatherings with Friends and Family: Once a week    Attends Religious Services: More than 4 times per year    Active Member of [employer] or Organizations: No    Attends Banker Meetings: Never    Marital Status: Widowed  Intimate Partner Violence: Not At Risk (10/25/2023)   Humiliation, Afraid, Rape, and Kick questionnaire    Fear of Current or Ex-Partner: No    Emotionally Abused: No    Physically Abused: No    Sexually Abused: No    FAMILY HISTORY: Family History  Problem Relation Age of Onset   Hypertension Mother    Stroke Mother    Diabetes Daughter    Stroke Father    Healthy Sister    Hypertension Maternal Grandmother     ALLERGIES:  is allergic to ace inhibitors.  MEDICATIONS:  Current Outpatient Medications  Medication Sig Dispense Refill   ACCU-CHEK GUIDE test strip USE  AS  INSTRUCTED  DAILY 100 strip 2   Accu-Chek Softclix Lancets lancets USE AS DIRECTED EVERY DAY 100 each 2   Alcohol  Swabs  (DROPSAFE ALCOHOL  PREP) 70 % PADS USE AS DIRECTED THREE TIMES DAILY 300 each 0   amiodarone (PACERONE) 200 MG tablet Take 100 mg by mouth daily.     amLODipine -valsartan  (EXFORGE ) 5-160 MG tablet Take 1 tablet by mouth daily. 100 tablet 1   apixaban (ELIQUIS) 2.5 MG TABS tablet Take 2.5 mg by mouth daily.     atorvastatin  (LIPITOR) 80 MG tablet Take 1 tablet (80 mg total) by mouth daily. 90 tablet 1   Blood Glucose Monitoring Suppl (ACCU-CHEK GUIDE ME) w/Device KIT 1 each by Does not apply route daily. 1 kit 0   cholecalciferol (VITAMIN D ) 1000 UNITS tablet Take 1,000 Units by mouth daily.      empagliflozin (JARDIANCE) 10 MG TABS tablet Take 1 tablet (10 mg total) by mouth daily. 90 tablet 1   ezetimibe (ZETIA) 10 MG tablet Take 1 tablet (10 mg total) by mouth daily. 90 tablet 1   folic acid  (FOLVITE ) 400 MCG tablet Take 400 mcg by mouth daily.     Multiple Vitamins-Minerals  (CENTRUM SILVER 50+WOMEN PO) Take 1 tablet by mouth daily.     No current facility-administered medications for this visit.  PHYSICAL EXAMINATION: ECOG PERFORMANCE STATUS: 1 - Symptomatic but completely ambulatory Vitals:   01/11/24 1023 01/11/24 1029  BP: (!) 144/73 138/69  Pulse: 88   Resp: 18   Temp: 97.8 F (36.6 C)    Filed Weights   01/11/24 1023  Weight: 99 lb 8 oz (45.1 kg)    Physical Exam Constitutional:      General: She is not in acute distress.    Appearance: She is not diaphoretic.     Comments: Thin built   HENT:     Head: Normocephalic and atraumatic.  Eyes:     General: No scleral icterus. Neck:     Vascular: No JVD.  Cardiovascular:     Rate and Rhythm: Normal rate.     Heart sounds: Murmur heard.  Pulmonary:     Effort: Pulmonary effort is normal. No respiratory distress.  Abdominal:     General: There is no distension.  Musculoskeletal:        General: Normal range of motion.     Cervical back: Normal range of motion and neck supple.  Lymphadenopathy:     Cervical: No cervical adenopathy.  Skin:    General: Skin is warm and dry.     Findings: No erythema or rash.  Neurological:     Mental Status: She is alert and oriented to person, place, and time. Mental status is at baseline.     Cranial Nerves: No cranial nerve deficit.     Motor: No abnormal muscle tone.  Psychiatric:        Mood and Affect: Affect normal.        Judgment: Judgment normal.      LABORATORY DATA:  I have reviewed the data as listed    Latest Ref Rng & Units 01/08/2024    9:43 AM 07/09/2023   10:33 AM 01/05/2023    9:58 AM  CBC  WBC 4.0 - 10.5 K/uL 3.6  3.5  3.6   Hemoglobin 12.0 - 15.0 g/dL 88.0  88.4  88.9   Hematocrit 36.0 - 46.0 % 37.1  35.9  33.9   Platelets 150 - 400 K/uL 188  196  195       Latest Ref Rng & Units 01/08/2024    9:44 AM 07/09/2023   10:33 AM 01/05/2023    9:58 AM  CMP  Glucose 70 - 99 mg/dL 881  869  873   BUN 8 - 23 mg/dL 20  17   15    Creatinine 0.44 - 1.00 mg/dL 8.80  8.97  8.89   Sodium 135 - 145 mmol/L 137  139  135   Potassium 3.5 - 5.1 mmol/L 3.9  3.8  3.5   Chloride 98 - 111 mmol/L 103  106  106   CO2 22 - 32 mmol/L 25  25  23    Calcium  8.9 - 10.3 mg/dL 8.8  8.8  8.5   Total Protein 6.5 - 8.1 g/dL 6.8  6.9  6.6   Total Bilirubin 0.0 - 1.2 mg/dL 0.7  0.7  0.9   Alkaline Phos 38 - 126 U/L 59  62  49   AST 15 - 41 U/L 29  28  30    ALT 0 - 44 U/L 26  29  27     Lab Results  Component Value Date   IRON  95 01/08/2024   TIBC 284 01/08/2024   FERRITIN 168 01/08/2024     SPEP not observed M spike.

## 2024-01-11 NOTE — Assessment & Plan Note (Signed)
-  On Eliquis 2.5 mg BID

## 2024-07-04 ENCOUNTER — Inpatient Hospital Stay

## 2024-07-08 ENCOUNTER — Ambulatory Visit: Admitting: Family Medicine

## 2024-07-09 ENCOUNTER — Inpatient Hospital Stay: Admitting: Oncology

## 2024-10-31 ENCOUNTER — Ambulatory Visit
